# Patient Record
Sex: Male | Born: 1950 | Race: Black or African American | Hispanic: No | Smoking: Never smoker
Health system: Southern US, Community
[De-identification: ages and names within clinical notes are randomized; demographics above are authoritative.]

## PROBLEM LIST (undated history)

## (undated) DIAGNOSIS — R05 Cough: Secondary | ICD-10-CM

## (undated) DIAGNOSIS — I509 Heart failure, unspecified: Secondary | ICD-10-CM

## (undated) DIAGNOSIS — R590 Localized enlarged lymph nodes: Secondary | ICD-10-CM

## (undated) DIAGNOSIS — R053 Chronic cough: Secondary | ICD-10-CM

## (undated) DIAGNOSIS — J8410 Pulmonary fibrosis, unspecified: Secondary | ICD-10-CM

## (undated) DIAGNOSIS — F1411 Cocaine abuse, in remission: Secondary | ICD-10-CM

## (undated) DIAGNOSIS — J449 Chronic obstructive pulmonary disease, unspecified: Secondary | ICD-10-CM

## (undated) DIAGNOSIS — K4090 Unilateral inguinal hernia, without obstruction or gangrene, not specified as recurrent: Secondary | ICD-10-CM

## (undated) DIAGNOSIS — R945 Abnormal results of liver function studies: Secondary | ICD-10-CM

## (undated) DIAGNOSIS — R7989 Other specified abnormal findings of blood chemistry: Secondary | ICD-10-CM

## (undated) DIAGNOSIS — J439 Emphysema, unspecified: Secondary | ICD-10-CM

## (undated) DIAGNOSIS — K219 Gastro-esophageal reflux disease without esophagitis: Secondary | ICD-10-CM

## (undated) HISTORY — DX: Chronic cough: R05.3

## (undated) HISTORY — DX: Chronic obstructive pulmonary disease, unspecified: J44.9

## (undated) HISTORY — DX: Heart failure, unspecified: I50.9

## (undated) HISTORY — DX: Cough: R05

## (undated) HISTORY — DX: Other specified abnormal findings of blood chemistry: R79.89

## (undated) HISTORY — DX: Emphysema, unspecified: J43.9

## (undated) HISTORY — DX: Cocaine abuse, in remission: F14.11

## (undated) HISTORY — DX: Abnormal results of liver function studies: R94.5

## (undated) HISTORY — DX: Localized enlarged lymph nodes: R59.0

## (undated) HISTORY — DX: Unilateral inguinal hernia, without obstruction or gangrene, not specified as recurrent: K40.90

## (undated) HISTORY — DX: Pulmonary fibrosis, unspecified: J84.10

---
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
## 1971-02-03 HISTORY — PX: HERNIA REPAIR: SHX51

## 2013-03-01 ENCOUNTER — Ambulatory Visit: Payer: Self-pay | Admitting: Family Medicine

## 2013-03-31 ENCOUNTER — Ambulatory Visit: Payer: Self-pay | Admitting: Family Medicine

## 2013-05-04 ENCOUNTER — Ambulatory Visit: Payer: Self-pay | Admitting: Internal Medicine

## 2013-05-06 LAB — BRONCHIAL WASH CULTURE

## 2013-05-09 LAB — PATHOLOGY REPORT

## 2013-05-24 ENCOUNTER — Ambulatory Visit: Payer: Self-pay | Admitting: Cardiothoracic Surgery

## 2013-05-25 LAB — CULTURE, FUNGUS WITHOUT SMEAR

## 2013-06-02 ENCOUNTER — Ambulatory Visit: Payer: Self-pay | Admitting: Cardiothoracic Surgery

## 2013-06-15 DIAGNOSIS — J8410 Pulmonary fibrosis, unspecified: Secondary | ICD-10-CM | POA: Insufficient documentation

## 2013-06-19 DIAGNOSIS — J849 Interstitial pulmonary disease, unspecified: Secondary | ICD-10-CM | POA: Insufficient documentation

## 2013-06-29 ENCOUNTER — Ambulatory Visit: Payer: Self-pay | Admitting: Rheumatology

## 2013-07-05 ENCOUNTER — Ambulatory Visit: Payer: Self-pay | Admitting: Rheumatology

## 2013-07-13 DIAGNOSIS — R590 Localized enlarged lymph nodes: Secondary | ICD-10-CM | POA: Insufficient documentation

## 2013-07-20 ENCOUNTER — Ambulatory Visit: Payer: Self-pay | Admitting: Family Medicine

## 2013-09-06 ENCOUNTER — Ambulatory Visit: Payer: Self-pay | Admitting: Family Medicine

## 2013-09-18 ENCOUNTER — Ambulatory Visit: Payer: Self-pay | Admitting: Specialist

## 2013-11-27 ENCOUNTER — Ambulatory Visit: Payer: Self-pay | Admitting: Gastroenterology

## 2013-12-05 ENCOUNTER — Ambulatory Visit: Payer: Self-pay | Admitting: Gastroenterology

## 2013-12-15 ENCOUNTER — Ambulatory Visit: Payer: Self-pay | Admitting: Gastroenterology

## 2014-01-24 ENCOUNTER — Encounter: Payer: Self-pay | Admitting: Surgery

## 2014-03-30 ENCOUNTER — Ambulatory Visit: Payer: Self-pay | Admitting: Specialist

## 2014-05-18 ENCOUNTER — Other Ambulatory Visit: Payer: Self-pay | Admitting: Specialist

## 2014-05-18 DIAGNOSIS — R918 Other nonspecific abnormal finding of lung field: Secondary | ICD-10-CM

## 2014-05-28 ENCOUNTER — Ambulatory Visit
Admit: 2014-05-28 | Disposition: A | Discharge status: Home / Self Care | Payer: Self-pay | Attending: Family Medicine | Admitting: Family Medicine

## 2014-05-28 LAB — SURGICAL PATHOLOGY

## 2014-10-01 ENCOUNTER — Ambulatory Visit
Admission: RE | Admit: 2014-10-01 | Discharge: 2014-10-01 | Disposition: A | Discharge status: Home / Self Care | Payer: PRIVATE HEALTH INSURANCE | Source: Ambulatory Visit | Attending: Specialist | Admitting: Specialist

## 2014-10-01 DIAGNOSIS — R918 Other nonspecific abnormal finding of lung field: Secondary | ICD-10-CM | POA: Diagnosis present

## 2014-10-01 DIAGNOSIS — J8410 Pulmonary fibrosis, unspecified: Secondary | ICD-10-CM | POA: Diagnosis not present

## 2014-10-01 DIAGNOSIS — R590 Localized enlarged lymph nodes: Secondary | ICD-10-CM | POA: Diagnosis not present

## 2014-10-24 DIAGNOSIS — Z9889 Other specified postprocedural states: Secondary | ICD-10-CM | POA: Insufficient documentation

## 2014-10-24 DIAGNOSIS — E785 Hyperlipidemia, unspecified: Secondary | ICD-10-CM | POA: Insufficient documentation

## 2014-10-24 DIAGNOSIS — I2510 Atherosclerotic heart disease of native coronary artery without angina pectoris: Secondary | ICD-10-CM | POA: Insufficient documentation

## 2014-10-24 DIAGNOSIS — I10 Essential (primary) hypertension: Secondary | ICD-10-CM | POA: Insufficient documentation

## 2014-10-24 DIAGNOSIS — J449 Chronic obstructive pulmonary disease, unspecified: Secondary | ICD-10-CM | POA: Insufficient documentation

## 2014-10-26 ENCOUNTER — Ambulatory Visit (INDEPENDENT_AMBULATORY_CARE_PROVIDER_SITE_OTHER): Payer: PRIVATE HEALTH INSURANCE | Admitting: Surgery

## 2014-10-26 ENCOUNTER — Encounter (INDEPENDENT_AMBULATORY_CARE_PROVIDER_SITE_OTHER): Payer: Self-pay

## 2014-10-26 ENCOUNTER — Encounter: Payer: Self-pay | Admitting: Surgery

## 2014-10-26 VITALS — BP 144/76 | HR 69 | Temp 97.9°F | Ht 69.0 in | Wt 217.0 lb

## 2014-10-26 DIAGNOSIS — K4091 Unilateral inguinal hernia, without obstruction or gangrene, recurrent: Secondary | ICD-10-CM | POA: Diagnosis not present

## 2014-10-26 DIAGNOSIS — K4090 Unilateral inguinal hernia, without obstruction or gangrene, not specified as recurrent: Secondary | ICD-10-CM

## 2014-10-26 MED ORDER — DEXTROSE 5 % IV SOLN: 2.0000 g | INTRAVENOUS | Status: AC

## 2014-10-26 MED ORDER — LACTATED RINGERS IV SOLN: INTRAVENOUS | Status: DC

## 2014-10-26 NOTE — Patient Instructions (Signed)
Angie will be contacting you to let you know when your appointment to Pre-Admit will be.  Remember that your surgery will be scheduled on 11/05/2014.

## 2014-10-26 NOTE — Progress Notes (Signed)
Subjective:     Patient ID: Jason Fitzgerald, male   DOB: Jul 13, 1950, 64 y.o.   MRN: [id number]  HPI  64 yr old male with complaints of right inguinal hernia.  Patient states that he had it repaired open in 1973, but since then with increased coughing he noticed it returned.  He stated that it will come out with coughing and he can push it back in.  He notes that on a couple occasions it has stuck for a while but gone back in.  He denies any pain there at this time.    Filed Vitals:   10/26/14 1032  BP: 144/76  Pulse: 69  Temp: 97.9 F (36.6 C)   Past Medical History  Diagnosis Date  . COPD (chronic obstructive pulmonary disease)    Past Surgical History  Procedure Laterality Date  . Hernia repair Right 1973    open   Family History  Problem Relation Age of Onset  . Diabetes Mother   . Cancer Father    Social History   Social History  . Marital Status: Divorced    Spouse Name: N/A  . Number of Children: N/A  . Years of Education: N/A   Social History Main Topics  . Smoking status: Never Smoker   . Smokeless tobacco: Never Used  . Alcohol Use: 0.0 oz/week    0 Standard drinks or equivalent per week     Comment: ocassional  . Drug Use: No  . Sexual Activity: Not Asked   Other Topics Concern  . None   Social History Narrative  . None   No Known Allergies  Current outpatient prescriptions:  .  benzonatate (TESSALON) 100 MG capsule, Take 1 capsule by mouth., Disp: , Rfl:  .  budesonide-formoterol (SYMBICORT) 80-4.5 MCG/ACT inhaler, Take 1 tablet by mouth 1 day or 1 dose., Disp: , Rfl:  .  hydroxychloroquine (PLAQUENIL) 200 MG tablet, Take 1 tablet by mouth 1 day or 1 dose., Disp: , Rfl:  .  pantoprazole (PROTONIX) 40 MG tablet, Take 1 tablet by mouth 1 day or 1 dose., Disp: , Rfl:   Current facility-administered medications:  .  ceFAZolin (ANCEF) 2 g in dextrose 5 % 50 mL IVPB, 2 g, Intravenous, On Call to OR, Hubbard Robinson, MD .  lactated ringers infusion, ,  Intravenous, Continuous, Hubbard Robinson, MD   Review of Systems  Constitutional: Negative for fever, chills, activity change, appetite change and fatigue.  HENT: Positive for congestion and rhinorrhea. Negative for sinus pressure and sore throat.   Respiratory: Positive for cough and shortness of breath. Negative for chest tightness, wheezing and stridor.   Cardiovascular: Negative for chest pain, palpitations and leg swelling.  Gastrointestinal: Negative for nausea, vomiting, abdominal pain, diarrhea, constipation, blood in stool and abdominal distention.  Genitourinary: Negative for dysuria, hematuria, scrotal swelling, difficulty urinating and testicular pain.  Skin: Negative for color change, pallor, rash and wound.  Neurological: Negative for dizziness, weakness and headaches.  Psychiatric/Behavioral: The patient is not nervous/anxious.   All other systems reviewed and are negative.      Objective:   Physical Exam  Constitutional: He is oriented to person, place, and time. He appears well-developed and well-nourished. No distress.  HENT:  Head: Normocephalic and atraumatic.  Nose: Nose normal.  Mouth/Throat: Oropharynx is clear and moist.  Eyes: Conjunctivae are normal. Pupils are equal, round, and reactive to light. No scleral icterus.  Neck: Normal range of motion. Neck supple. No tracheal deviation present.  Cardiovascular: Normal rate, regular rhythm, normal heart sounds and intact distal pulses.  Exam reveals no gallop and no friction rub.   No murmur heard. Pulmonary/Chest: Effort normal and breath sounds normal. No respiratory distress. He has no wheezes.  Abdominal: Soft. Bowel sounds are normal. He exhibits no distension. There is no tenderness.  Genitourinary:  Right groin with well healed surgical scar, reducible inguinal hernia, defect felt about 2-3cm in size.   Left groin: no hernia palpable and none with coughing  Musculoskeletal: Normal range of motion. He  exhibits no edema or tenderness.  Neurological: He is alert and oriented to person, place, and time.  Skin: Skin is warm and dry. No rash noted. No erythema. No pallor.  Psychiatric: He has a normal mood and affect. His behavior is normal. Judgment and thought content normal.  Vitals reviewed.      Assessment:     64 yr old male with recurrent right inguinal hernia     Plan:      1. Discussed possibility of incarceration, strangulation, enlargement in size over time, and the risk of emergency surgery in the face of strangulation.  Also discussed the risk of surgery including recurrence which can be up to 10% in the case of laparoscopic repair, use of prosthetic materials (mesh) and the increased risk of infxn, post-op infxn and the possible need for re-operation and removal of mesh if used, possibility of post-op SBO or ileus, and the risks of general anesthetic including MI, CVA, sudden death or even reaction to anesthetic medications. The patient understands the risks, any and all questions were answered to the patient's satisfaction. 2. Patient has elected to proceed with surgical treatment. Procedure will be scheduled for October 3rd.

## 2014-10-30 ENCOUNTER — Telehealth: Payer: Self-pay | Admitting: Surgery

## 2014-10-30 NOTE — Telephone Encounter (Signed)
Pt advised of pre op date/time and sx date. Sx: 11/08/14 Right inguinal hernia repair (lap), possible bilateral--Dr Loflin Pre op: 11/02/14 @ 9:45am--office.

## 2014-11-02 ENCOUNTER — Encounter
Admission: RE | Admit: 2014-11-02 | Discharge: 2014-11-02 | Disposition: A | Discharge status: Home / Self Care | Payer: PRIVATE HEALTH INSURANCE | Source: Ambulatory Visit | Attending: Surgery | Admitting: Surgery

## 2014-11-02 ENCOUNTER — Other Ambulatory Visit: Payer: Self-pay

## 2014-11-02 DIAGNOSIS — J449 Chronic obstructive pulmonary disease, unspecified: Secondary | ICD-10-CM

## 2014-11-02 DIAGNOSIS — Z01818 Encounter for other preprocedural examination: Secondary | ICD-10-CM | POA: Diagnosis present

## 2014-11-02 DIAGNOSIS — K4090 Unilateral inguinal hernia, without obstruction or gangrene, not specified as recurrent: Secondary | ICD-10-CM | POA: Insufficient documentation

## 2014-11-02 HISTORY — DX: Gastro-esophageal reflux disease without esophagitis: K21.9

## 2014-11-02 LAB — DIFFERENTIAL
BASOS PCT: 0 %
Basophils Absolute: 0 10*3/uL (ref 0–0.1)
EOS ABS: 0.4 10*3/uL (ref 0–0.7)
Eosinophils Relative: 8 %
LYMPHS ABS: 1.1 10*3/uL (ref 1.0–3.6)
Lymphocytes Relative: 23 %
Monocytes Absolute: 0.6 10*3/uL (ref 0.2–1.0)
Monocytes Relative: 13 %
NEUTROS PCT: 56 %
Neutro Abs: 2.6 10*3/uL (ref 1.4–6.5)

## 2014-11-02 LAB — HEPATIC FUNCTION PANEL
ALBUMIN: 3.7 g/dL (ref 3.5–5.0)
ALT: 14 U/L — ABNORMAL LOW (ref 17–63)
AST: 22 U/L (ref 15–41)
Alkaline Phosphatase: 53 U/L (ref 38–126)
BILIRUBIN DIRECT: 0.1 mg/dL (ref 0.1–0.5)
BILIRUBIN TOTAL: 0.7 mg/dL (ref 0.3–1.2)
Indirect Bilirubin: 0.6 mg/dL (ref 0.3–0.9)
Total Protein: 7.6 g/dL (ref 6.5–8.1)

## 2014-11-02 LAB — BASIC METABOLIC PANEL
ANION GAP: 9 (ref 5–15)
BUN: 18 mg/dL (ref 6–20)
CALCIUM: 8.4 mg/dL — AB (ref 8.9–10.3)
CO2: 24 mmol/L (ref 22–32)
CREATININE: 0.85 mg/dL (ref 0.61–1.24)
Chloride: 105 mmol/L (ref 101–111)
GFR calc Af Amer: >60 mL/min (ref >60)
GLUCOSE: 97 mg/dL (ref 65–99)
Potassium: 3.6 mmol/L (ref 3.5–5.1)
Sodium: 138 mmol/L (ref 135–145)

## 2014-11-02 LAB — CBC
HCT: 39.6 % — ABNORMAL LOW (ref 40.0–52.0)
HEMOGLOBIN: 13.1 g/dL (ref 13.0–18.0)
MCH: 28.3 pg (ref 26.0–34.0)
MCHC: 33 g/dL (ref 32.0–36.0)
MCV: 85.7 fL (ref 80.0–100.0)
Platelets: 249 10*3/uL (ref 150–440)
RBC: 4.62 MIL/uL (ref 4.40–5.90)
RDW: 13.4 % (ref 11.5–14.5)
WBC: 4.6 10*3/uL (ref 3.8–10.6)

## 2014-11-02 NOTE — Addendum Note (Signed)
Addended by: Phillips Odor on: 11/02/2014 12:30 PM   Modules accepted: Orders

## 2014-11-02 NOTE — Pre-Procedure Instructions (Signed)
Spoke with Angie at Dr. Mills Koller office, need pre-op orders re-entered, the orders did not go into Manage Orders section of Epic,also asked if pt needs to stop Plaquinil, answer was no, pt can continue taking Plaquinil.

## 2014-11-02 NOTE — Pre-Procedure Instructions (Signed)
EKG taken to DR. Adams for review, ok to proceed.

## 2014-11-02 NOTE — Patient Instructions (Signed)
  Your procedure is scheduled on: Thursday Oct. 6, [address]. Report to Same Day Surgery. To find out your arrival time please call [phone] between 1PM - 3PM on Wednesday Oct. 5, 2016.  Remember: Instructions that are not followed completely may result in serious medical risk, up to and including death, or upon the discretion of your surgeon and anesthesiologist your surgery may need to be rescheduled.    _x___ 1. Do not eat food or drink liquids after midnight. No gum chewing or hard candies.     _x__ 2. No Alcohol for 24 hours before or after surgery.   ____ 3. Bring all medications with you on the day of surgery if instructed.    _x___ 4. Notify your doctor if there is any change in your medical condition     (cold, fever, infections).     Do not wear jewelry, make-up, hairpins, clips or nail polish.  Do not wear lotions, powders, or perfumes. You may wear deodorant.  Do not shave 48 hours prior to surgery. Men may shave face and neck.  Do not bring valuables to the hospital.    [facility] is not responsible for any belongings or valuables.               Contacts, dentures or bridgework may not be worn into surgery.  Leave your suitcase in the car. After surgery it may be brought to your room.  For patients admitted to the hospital, discharge time is determined by your treatment team.   Patients discharged the day of surgery will not be allowed to drive home.    Please read over the following fact sheets that you were given:   [facility] [place] Preparing for Surgery  ____ Take these medicines the morning of surgery with A SIP OF WATER:    1. pantoprazole (PROTONIX)    ____ Fleet Enema (as directed)   _x___ Use CHG Soap as directed  _x___ Use inhalers on the day of surgery and bring to hospital.  ____ Stop metformin 2 days prior to surgery    ____ Take 1/2 of usual insulin dose the night before surgery and none on the morning of surgery.   ____ Stop  Coumadin/Plavix/aspirin on does not apply.  _x___ Stop Anti-inflammatories now, can take ""Tylenol for pain.   ____ Stop supplements until after surgery.    ____ Bring C-Pap to the hospital.

## 2014-11-07 ENCOUNTER — Telehealth: Payer: Self-pay | Admitting: Surgery

## 2014-11-07 NOTE — Telephone Encounter (Signed)
Authorization has been obtained for CPT: [id number] Authorization # [id number] per Roselyn Reef with [employer].

## 2014-11-08 ENCOUNTER — Encounter: Payer: Self-pay | Admitting: *Deleted

## 2014-11-08 ENCOUNTER — Ambulatory Visit: Payer: PRIVATE HEALTH INSURANCE | Admitting: Anesthesiology

## 2014-11-08 ENCOUNTER — Encounter
Admission: RE | Disposition: A | Discharge status: Home / Self Care | Payer: Self-pay | Source: Ambulatory Visit | Attending: Surgery

## 2014-11-08 ENCOUNTER — Ambulatory Visit
Admission: RE | Admit: 2014-11-08 | Discharge: 2014-11-08 | Disposition: A | Discharge status: Home / Self Care | Payer: PRIVATE HEALTH INSURANCE | Source: Ambulatory Visit | Attending: Surgery | Admitting: Surgery

## 2014-11-08 DIAGNOSIS — Z809 Family history of malignant neoplasm, unspecified: Secondary | ICD-10-CM | POA: Insufficient documentation

## 2014-11-08 DIAGNOSIS — K219 Gastro-esophageal reflux disease without esophagitis: Secondary | ICD-10-CM | POA: Insufficient documentation

## 2014-11-08 DIAGNOSIS — K4091 Unilateral inguinal hernia, without obstruction or gangrene, recurrent: Secondary | ICD-10-CM | POA: Diagnosis not present

## 2014-11-08 DIAGNOSIS — Z833 Family history of diabetes mellitus: Secondary | ICD-10-CM | POA: Insufficient documentation

## 2014-11-08 DIAGNOSIS — F17210 Nicotine dependence, cigarettes, uncomplicated: Secondary | ICD-10-CM | POA: Diagnosis not present

## 2014-11-08 DIAGNOSIS — J449 Chronic obstructive pulmonary disease, unspecified: Secondary | ICD-10-CM | POA: Diagnosis not present

## 2014-11-08 DIAGNOSIS — K4090 Unilateral inguinal hernia, without obstruction or gangrene, not specified as recurrent: Secondary | ICD-10-CM | POA: Diagnosis present

## 2014-11-08 HISTORY — PX: INGUINAL HERNIA REPAIR: SHX194

## 2014-11-08 LAB — URINE DRUG SCREEN, QUALITATIVE (ARMC ONLY)
AMPHETAMINES, UR SCREEN: NOT DETECTED
BENZODIAZEPINE, UR SCRN: NOT DETECTED
Barbiturates, Ur Screen: NOT DETECTED
Cannabinoid 50 Ng, Ur ~~LOC~~: NOT DETECTED
Cocaine Metabolite,Ur ~~LOC~~: NOT DETECTED
MDMA (ECSTASY) UR SCREEN: NOT DETECTED
Methadone Scn, Ur: NOT DETECTED
OPIATE, UR SCREEN: NOT DETECTED
PHENCYCLIDINE (PCP) UR S: NOT DETECTED
Tricyclic, Ur Screen: NOT DETECTED

## 2014-11-08 SURGERY — REPAIR, HERNIA, INGUINAL, BILATERAL, LAPAROSCOPIC
Anesthesia: General | Laterality: Right | Wound class: Clean

## 2014-11-08 MED ORDER — PROPOFOL 10 MG/ML IV BOLUS
INTRAVENOUS | Status: DC | PRN
  Administered 2014-11-08: 200 mg via INTRAVENOUS

## 2014-11-08 MED ORDER — LACTATED RINGERS IV SOLN
INTRAVENOUS | Status: DC
  Administered 2014-11-08 (×2): via INTRAVENOUS

## 2014-11-08 MED ORDER — ONDANSETRON HCL 4 MG/2ML IJ SOLN: 4.0000 mg | Freq: Once | INTRAMUSCULAR | Status: DC | PRN

## 2014-11-08 MED ORDER — BUPIVACAINE HCL (PF) 0.25 % IJ SOLN
INTRAMUSCULAR | Status: AC
  Filled 2014-11-08: qty 30

## 2014-11-08 MED ORDER — HYDROMORPHONE HCL 1 MG/ML IJ SOLN: 0.2500 mg | INTRAMUSCULAR | Status: DC | PRN

## 2014-11-08 MED ORDER — FENTANYL CITRATE (PF) 250 MCG/5ML IJ SOLN
INTRAMUSCULAR | Status: DC | PRN
  Administered 2014-11-08: 50 ug via INTRAVENOUS
  Administered 2014-11-08: 100 ug via INTRAVENOUS
  Administered 2014-11-08 (×4): 50 ug via INTRAVENOUS

## 2014-11-08 MED ORDER — ROCURONIUM BROMIDE 100 MG/10ML IV SOLN
INTRAVENOUS | Status: DC | PRN
  Administered 2014-11-08: 10 mg via INTRAVENOUS
  Administered 2014-11-08: 40 mg via INTRAVENOUS

## 2014-11-08 MED ORDER — CEFAZOLIN SODIUM-DEXTROSE 2-3 GM-% IV SOLR
2.0000 g | INTRAVENOUS | Status: AC
  Administered 2014-11-08: 2 g via INTRAVENOUS

## 2014-11-08 MED ORDER — LIDOCAINE HCL (CARDIAC) 20 MG/ML IV SOLN
INTRAVENOUS | Status: DC | PRN
Start: 2014-11-08 — End: 2014-11-08
  Administered 2014-11-08: 100 mg via INTRAVENOUS

## 2014-11-08 MED ORDER — DEXAMETHASONE SODIUM PHOSPHATE 10 MG/ML IJ SOLN
INTRAMUSCULAR | Status: DC | PRN
Start: 2014-11-08 — End: 2014-11-08
  Administered 2014-11-08: 10 mg via INTRAVENOUS

## 2014-11-08 MED ORDER — SUCCINYLCHOLINE CHLORIDE 20 MG/ML IJ SOLN
INTRAMUSCULAR | Status: DC | PRN
  Administered 2014-11-08: 120 mg via INTRAVENOUS

## 2014-11-08 MED ORDER — FENTANYL CITRATE (PF) 100 MCG/2ML IJ SOLN
25.0000 ug | INTRAMUSCULAR | Status: DC | PRN
  Administered 2014-11-08 (×4): 25 ug via INTRAVENOUS

## 2014-11-08 MED ORDER — GLYCOPYRROLATE 0.2 MG/ML IJ SOLN
INTRAMUSCULAR | Status: DC | PRN
  Administered 2014-11-08: 0.2 mg via INTRAVENOUS

## 2014-11-08 MED ORDER — MIDAZOLAM HCL 5 MG/5ML IJ SOLN
INTRAMUSCULAR | Status: DC | PRN
  Administered 2014-11-08: 2 mg via INTRAVENOUS

## 2014-11-08 MED ORDER — CEFAZOLIN SODIUM-DEXTROSE 2-3 GM-% IV SOLR
INTRAVENOUS | Status: AC
  Administered 2014-11-08: 2 g via INTRAVENOUS
  Filled 2014-11-08: qty 50

## 2014-11-08 MED ORDER — ACETAMINOPHEN 10 MG/ML IV SOLN
INTRAVENOUS | Status: DC | PRN
  Administered 2014-11-08: 1000 mg via INTRAVENOUS

## 2014-11-08 MED ORDER — ESMOLOL HCL 10 MG/ML IV SOLN
INTRAVENOUS | Status: DC | PRN
  Administered 2014-11-08 (×2): 10 mg via INTRAVENOUS

## 2014-11-08 MED ORDER — OXYCODONE-ACETAMINOPHEN 5-325 MG PO TABS
ORAL_TABLET | ORAL | Status: DC
Start: 2014-11-08 — End: 2014-11-08
  Filled 2014-11-08: qty 1

## 2014-11-08 MED ORDER — ACETAMINOPHEN 10 MG/ML IV SOLN
INTRAVENOUS | Status: AC
  Filled 2014-11-08: qty 100

## 2014-11-08 MED ORDER — BUPIVACAINE HCL (PF) 0.25 % IJ SOLN
INTRAMUSCULAR | Status: DC | PRN
Start: 2014-11-08 — End: 2014-11-08
  Administered 2014-11-08: 30 mL

## 2014-11-08 MED ORDER — ACETAMINOPHEN 10 MG/ML IV SOLN: INTRAVENOUS | Status: DC | PRN

## 2014-11-08 MED ORDER — GLYCOPYRROLATE 0.2 MG/ML IJ SOLN: INTRAMUSCULAR | Status: DC | PRN

## 2014-11-08 MED ORDER — OXYCODONE-ACETAMINOPHEN 5-325 MG PO TABS: 1.0000 | ORAL_TABLET | ORAL | Status: DC | PRN

## 2014-11-08 MED ORDER — OXYCODONE-ACETAMINOPHEN 5-325 MG PO TABS
1.0000 | ORAL_TABLET | ORAL | Status: DC | PRN
  Administered 2014-11-08: 1 via ORAL

## 2014-11-08 MED ORDER — FENTANYL CITRATE (PF) 100 MCG/2ML IJ SOLN
INTRAMUSCULAR | Status: AC
  Filled 2014-11-08: qty 2

## 2014-11-08 MED ORDER — ONDANSETRON HCL 4 MG/2ML IJ SOLN
INTRAMUSCULAR | Status: DC | PRN
  Administered 2014-11-08: 4 mg via INTRAVENOUS

## 2014-11-08 MED ORDER — SUGAMMADEX SODIUM 500 MG/5ML IV SOLN
INTRAVENOUS | Status: DC | PRN
  Administered 2014-11-08: 200.4 mg via INTRAVENOUS

## 2014-11-08 SURGICAL SUPPLY — 47 items
CANISTER SUCT 1200ML W/VALVE (MISCELLANEOUS) ×3 IMPLANT
CATH TRAY 16F METER LATEX (MISCELLANEOUS) ×3 IMPLANT
CHLORAPREP W/TINT 26ML (MISCELLANEOUS) ×3 IMPLANT
CLEANER CAUTERY TIP 5X5 PAD (MISCELLANEOUS) ×1 IMPLANT
CLOSURE WOUND 1/2 X4 (GAUZE/BANDAGES/DRESSINGS)
DEFOGGER SCOPE WARMER CLEARIFY (MISCELLANEOUS) ×3 IMPLANT
DEVICE SECURE STRAP 25 ABSORB (INSTRUMENTS) ×3 IMPLANT
DISSECT BALLN SPACEMKR OVL PDB (BALLOONS) ×6
DISSECT BALLN SPACEMKR RND PDB (MISCELLANEOUS)
DISSECTOR BALLN SPCMKR OVL PDB (BALLOONS) ×2 IMPLANT
DISSECTOR BALLN SPCMKR RND PDB (MISCELLANEOUS) IMPLANT
DRAPE SHEET LG 3/4 BI-LAMINATE (DRAPES) IMPLANT
DRAPE UTILITY 15X26 TOWEL STRL (DRAPES) IMPLANT
DRSG TEGADERM 2-3/8X2-3/4 SM (GAUZE/BANDAGES/DRESSINGS) IMPLANT
DRSG TELFA 3X8 NADH (GAUZE/BANDAGES/DRESSINGS) IMPLANT
GLOVE BIO SURGEON STRL SZ7.5 (GLOVE) ×9 IMPLANT
GOWN STRL REUS W/ TWL LRG LVL3 (GOWN DISPOSABLE) ×1 IMPLANT
GOWN STRL REUS W/ TWL XL LVL3 (GOWN DISPOSABLE) IMPLANT
GOWN STRL REUS W/TWL LRG LVL3 (GOWN DISPOSABLE) ×2
GOWN STRL REUS W/TWL XL LVL3 (GOWN DISPOSABLE)
IRRIGATION STRYKERFLOW (MISCELLANEOUS) IMPLANT
IRRIGATOR STRYKERFLOW (MISCELLANEOUS)
IV NS 1000ML (IV SOLUTION)
IV NS 1000ML BAXH (IV SOLUTION) IMPLANT
KIT RM TURNOVER STRD PROC AR (KITS) ×3 IMPLANT
LABEL OR SOLS (LABEL) IMPLANT
LIQUID BAND (GAUZE/BANDAGES/DRESSINGS) ×3 IMPLANT
MESH 3DMAX 5X7 RT XLRG (Mesh General) ×3 IMPLANT
NEEDLE HYPO 25X1 1.5 SAFETY (NEEDLE) ×3 IMPLANT
NS IRRIG 500ML POUR BTL (IV SOLUTION) ×3 IMPLANT
PACK LAP CHOLECYSTECTOMY (MISCELLANEOUS) ×3 IMPLANT
PAD CLEANER CAUTERY TIP 5X5 (MISCELLANEOUS) ×2
PAD GROUND ADULT SPLIT (MISCELLANEOUS) ×3 IMPLANT
PENCIL ELECTRO HAND CTR (MISCELLANEOUS) ×3 IMPLANT
SLEEVE ENDOPATH XCEL 5M (ENDOMECHANICALS) ×3 IMPLANT
STRIP CLOSURE SKIN 1/2X4 (GAUZE/BANDAGES/DRESSINGS) IMPLANT
SURGILUBE 2OZ TUBE FLIPTOP (MISCELLANEOUS) ×3 IMPLANT
SUT MNCRL 4-0 (SUTURE) ×2
SUT MNCRL 4-0 27XMFL (SUTURE) ×1
SUT VIC AB 0 CT2 27 (SUTURE) ×3 IMPLANT
SUTURE MNCRL 4-0 27XMF (SUTURE) ×1 IMPLANT
SWABSTK COMLB BENZOIN TINCTURE (MISCELLANEOUS) IMPLANT
TACKER 5MM HERNIA 3.5CML NAB (ENDOMECHANICALS) IMPLANT
TROCAR 5MM SINGLE VERSAONE (TROCAR) ×3 IMPLANT
TROCAR BALLN 10M OMST10SB SPAC (TROCAR) ×3 IMPLANT
TROCAR XCEL NON-BLD 5MMX100MML (ENDOMECHANICALS) ×3 IMPLANT
TUBING INSUFFLATOR HI FLOW (MISCELLANEOUS) ×3 IMPLANT

## 2014-11-08 NOTE — Discharge Instructions (Signed)
Inguinal Hernia, Adult , Care After Refer to this sheet in the next few weeks. These discharge instructions provide you with general information on caring for yourself after you leave the hospital. Your caregiver may also give you specific instructions. Your treatment has been planned according to the most current medical practices available, but unavoidable complications sometimes occur. If you have any problems or questions after discharge, please call your caregiver. HOME CARE INSTRUCTIONS  Put ice on the operative site.  Put ice in a plastic bag.  Place a towel between your skin and the bag.  Leave the ice on for 15-20 minutes at a time, 03-04 times a day while awake.  Change bandages (dressings) as directed.  Keep the wound dry and clean. The wound may be washed gently with soap and water. Gently blot or dab the wound dry. It is okay to take showers 24 to 48 hours after surgery. Do not take baths, use swimming pools, or use hot tubs for 10 days, or as directed by your caregiver.  Only take over-the-counter or prescription medicines for pain, discomfort, or fever as directed by your caregiver.  Continue your normal diet as directed.  Do not lift anything more than 10 pounds or play contact sports for 3 weeks, or as directed. SEEK MEDICAL CARE IF:  There is redness, swelling, or increasing pain in the wound.  There is fluid (pus) coming from the wound.  There is drainage from a wound lasting longer than 1 day.  You have an oral temperature above 102 F (38.9 C).  You notice a bad smell coming from the wound or dressing.  The wound breaks open after the stitches (sutures) have been removed.  You notice increasing pain in the shoulders (shoulder strap areas).  You develop dizzy episodes or fainting while standing.  You feel sick to your stomach (nauseous) or throw up (vomit). SEEK IMMEDIATE MEDICAL CARE IF:  You develop a rash.  You have difficulty breathing.  You  develop a reaction or have side effects to medicines you were given. MAKE SURE YOU:   Understand these instructions.  Will watch your condition.  Will get help right away if you are not doing well or get worse.   This information is not intended to replace advice given to you by your health care provider. Make sure you discuss any questions you have with your health care provider.   Document Released: 02/19/2006 Document Revised: 02/09/2014 Document Reviewed: 07/23/2014 Elsevier Interactive Patient Education 2016 [place] Anesthesia, Adult, Care After Refer to this sheet in the next few weeks. These instructions provide you with information on caring for yourself after your procedure. Your health care provider may also give you more specific instructions. Your treatment has been planned according to current medical practices, but problems sometimes occur. Call your health care provider if you have any problems or questions after your procedure. WHAT TO EXPECT AFTER THE PROCEDURE After the procedure, it is typical to experience:  Sleepiness.  Nausea and vomiting. HOME CARE INSTRUCTIONS  For the first 24 hours after general anesthesia:  Have a responsible person with you.  Do not drive a car. If you are alone, do not take public transportation.  Do not drink alcohol.  Do not take medicine that has not been prescribed by your health care provider.  Do not sign important papers or make important decisions.  You may resume a normal diet and activities as directed by your health care provider.  Change  bandages (dressings) as directed.  If you have questions or problems that seem related to general anesthesia, call the hospital and ask for the anesthetist or anesthesiologist on call. SEEK MEDICAL CARE IF:  You have nausea and vomiting that continue the day after anesthesia.  You develop a rash. SEEK IMMEDIATE MEDICAL CARE IF:   You have difficulty  breathing.  You have chest pain.  You have any allergic problems.   This information is not intended to replace advice given to you by your health care provider. Make sure you discuss any questions you have with your health care provider.   Document Released: 04/27/2000 Document Revised: 02/09/2014 Document Reviewed: 05/20/2011 Elsevier Interactive Patient Education [employer].

## 2014-11-08 NOTE — Transfer of Care (Signed)
Immediate Anesthesia Transfer of Care Note  Patient: Jason Fitzgerald  Procedure(s) Performed: Procedure(s): LAPAROSCOPIC RIGHT INGUINAL HERNIA REPAIR (Right)  Patient Location: PACU  Anesthesia Type:General  Level of Consciousness: awake, alert  and oriented  Airway & Oxygen Therapy: Patient Spontanous Breathing and Patient connected to face mask oxygen  Post-op Assessment: Report given to RN and Post -op Vital signs reviewed and stable  Post vital signs: Reviewed and stable  Last Vitals: 100% 20resp 98hr 154/94 98.9temp Filed Vitals:   11/08/14 0748  BP: 142/78  Pulse: 86  Temp: 35.7 C  Resp: 18    Complications: No apparent anesthesia complications

## 2014-11-08 NOTE — Anesthesia Procedure Notes (Signed)
Procedure Name: Intubation Date/Time: 11/08/2014 9:43 AM Performed by: Delaney Meigs Pre-anesthesia Checklist: Patient identified, Emergency Drugs available, Suction available, Patient being monitored and Timeout performed Patient Re-evaluated:Patient Re-evaluated prior to inductionOxygen Delivery Method: Circle system utilized Preoxygenation: Pre-oxygenation with 100% oxygen Intubation Type: IV induction Ventilation: Mask ventilation without difficulty Laryngoscope Size: McGraph and 4 Grade View: Grade II Tube type: Oral Tube size: 7.5 mm Number of attempts: 1 Airway Equipment and Method: Stylet Placement Confirmation: ETT inserted through vocal cords under direct vision,  positive ETCO2 and breath sounds checked- equal and bilateral Secured at: 21 cm Tube secured with: Tape Dental Injury: Teeth and Oropharynx as per pre-operative assessment

## 2014-11-08 NOTE — Op Note (Signed)
Herniorrhaphy Procedure Note  Indications: The patient has a symptomatic recurrent right inguinal hernia.   Pre-operative Diagnosis: right inguinal hernia  Post-operative Diagnosis: right inguinal hernia  Surgeon: Hubbard Robinson    Anesthesia: General endotracheal anesthesia  ASA Class: 3  Procedure Details  The patient was seen in the Holding Room. The risks, benefits, complications, treatment options, and expected outcomes were discussed with the patient. The possibilities of reaction to medication, pain, infection, bleeding, heart attack, death, injury to internal organs, recurrence, testicular damage, infertility, or need for further surgery were discussed with the patient. The patient concurred with the proposed plan, giving informed consent.  The site of surgery properly noted/marked. The patient was taken to Operating Room, identified as Jason Fitzgerald and the procedure verified as Laparoscopic Right inguinal hernia repair possible bilateral. A Time Out was held and the above information confirmed.  After the induction of adequate anesthesia, the abdomen was prepped and draped in the usual sterile fashion. An infraumbilical skin incision was made and carried to the external oblique fascia. An small incision in the fascia exposed the rectus muscle which was retracted laterally. The dissection balloon was introduced into the preperitoneal space and advanced to the pubis. The balloon was distended with air by pumping 30 times. The air was evacuated and the balloon removed. The trocar with balloon was then inserted and the preperitoneal space inflated with gas.  The laparoscope was introduced after distending the dissected cavity with C02 gas. Two 5 mm porst were introduced under direct visualization. With blunt dissection the hernia sac was reduced and the spermatic cord skeletonized. A Bard 3D extra large right sided mesh was introduced into the preperitoneal space.  The mesh was fixed to  Cooper's ligament and the anterior abdominal wall with the secure strap tacks. Nerves and vessels were protected. No sutures or staples were placed in the femoral vessels.  Hemostasis was obtained.  The left side was inspected and no defect was noted.  Gas was evacuated, trocars removed and the fascial defects closed with 0-vicryl.  The skin was closed using 4-0 Monocryl and sterile glue was applied to wounds.  At the end of the operation, all sponge, instruments, and needle counts were correct.   Findings: right indirect hernia  Estimated Blood Loss:  less than 50 mL         Drains: none         Total IV Fluids: 1071ml         Specimens: none         Implants: Right sided 3D polypropylene mesh         Complications:  None; patient tolerated the procedure well.         Disposition: PACU - hemodynamically stable.         Condition: stable

## 2014-11-08 NOTE — Anesthesia Preprocedure Evaluation (Signed)
Anesthesia Evaluation  Patient identified by MRN, date of birth, ID band Patient awake    Reviewed: Allergy & Precautions, H&P , NPO status , Patient's Chart, lab work & pertinent test results, reviewed documented beta blocker date and time   Airway Mallampati: II  TM Distance: >3 FB Neck ROM: full    Dental no notable dental hx. (+) Teeth Intact   Pulmonary neg pulmonary ROS, pneumonia, resolved, COPD, Current Smoker,    Pulmonary exam normal breath sounds clear to auscultation       Cardiovascular Exercise Tolerance: Poor hypertension, + CAD  negative cardio ROS Normal cardiovascular exam Rhythm:regular Rate:Normal     Neuro/Psych negative neurological ROS  negative psych ROS   GI/Hepatic negative GI ROS, Neg liver ROS, GERD  ,  Endo/Other  negative endocrine ROS  Renal/GU negative Renal ROS  negative genitourinary   Musculoskeletal   Abdominal   Peds  Hematology negative hematology ROS (+)   Anesthesia Other Findings   Reproductive/Obstetrics negative OB ROS                             Anesthesia Physical Anesthesia Plan  ASA: II  Anesthesia Plan: General   Post-op Pain Management:    Induction:   Airway Management Planned:   Additional Equipment:   Intra-op Plan:   Post-operative Plan:   Informed Consent: I have reviewed the patients History and Physical, chart, labs and discussed the procedure including the risks, benefits and alternatives for the proposed anesthesia with the patient or authorized representative who has indicated his/her understanding and acceptance.   Dental Advisory Given  Plan Discussed with: CRNA  Anesthesia Plan Comments:         Anesthesia Quick Evaluation

## 2014-11-08 NOTE — Anesthesia Postprocedure Evaluation (Signed)
  Anesthesia Post-op Note  Patient: Jason Fitzgerald  Procedure(s) Performed: Procedure(s): LAPAROSCOPIC RIGHT INGUINAL HERNIA REPAIR (Right)  Anesthesia type:General  Patient location: PACU  Post pain: Pain level controlled  Post assessment: Post-op Vital signs reviewed, Patient's Cardiovascular Status Stable, Respiratory Function Stable, Patent Airway and No signs of Nausea or vomiting  Post vital signs: Reviewed and stable  Last Vitals:  Filed Vitals:   11/08/14 1201  BP: 154/90  Pulse: 95  Temp: 37.2 C  Resp: 22    Level of consciousness: awake, alert  and patient cooperative  Complications: No apparent anesthesia complications

## 2014-11-08 NOTE — Interval H&P Note (Signed)
History and Physical Interval Note:  11/08/2014 9:11 AM  [name]  has presented today for surgery, with the diagnosis of inguinal hernia  The various methods of treatment have been discussed with the patient and family. After consideration of risks, benefits and other options for treatment, the patient has consented to  Procedure(s): Tucker (Right) as a surgical intervention .  The patient's history has been reviewed, patient examined, no change in status, stable for surgery.  I have reviewed the patient's chart and labs.  Questions were answered to the patient's satisfaction.     Catherine L Loflin

## 2014-11-08 NOTE — H&P (View-Only) (Signed)
Subjective:     Patient ID: Jason Fitzgerald, male   DOB: 1950-03-07, 65 y.o.   MRN: [id number]  HPI  64 yr old male with complaints of right inguinal hernia.  Patient states that he had it repaired open in 1973, but since then with increased coughing he noticed it returned.  He stated that it will come out with coughing and he can push it back in.  He notes that on a couple occasions it has stuck for a while but gone back in.  He denies any pain there at this time.    Filed Vitals:   10/26/14 1032  BP: 144/76  Pulse: 69  Temp: 97.9 F (36.6 C)   Past Medical History  Diagnosis Date  . COPD (chronic obstructive pulmonary disease)    Past Surgical History  Procedure Laterality Date  . Hernia repair Right 1973    open   Family History  Problem Relation Age of Onset  . Diabetes Mother   . Cancer Father    Social History   Social History  . Marital Status: Divorced    Spouse Name: N/A  . Number of Children: N/A  . Years of Education: N/A   Social History Main Topics  . Smoking status: Never Smoker   . Smokeless tobacco: Never Used  . Alcohol Use: 0.0 oz/week    0 Standard drinks or equivalent per week     Comment: ocassional  . Drug Use: No  . Sexual Activity: Not Asked   Other Topics Concern  . None   Social History Narrative  . None   No Known Allergies  Current outpatient prescriptions:  .  benzonatate (TESSALON) 100 MG capsule, Take 1 capsule by mouth., Disp: , Rfl:  .  budesonide-formoterol (SYMBICORT) 80-4.5 MCG/ACT inhaler, Take 1 tablet by mouth 1 day or 1 dose., Disp: , Rfl:  .  hydroxychloroquine (PLAQUENIL) 200 MG tablet, Take 1 tablet by mouth 1 day or 1 dose., Disp: , Rfl:  .  pantoprazole (PROTONIX) 40 MG tablet, Take 1 tablet by mouth 1 day or 1 dose., Disp: , Rfl:   Current facility-administered medications:  .  ceFAZolin (ANCEF) 2 g in dextrose 5 % 50 mL IVPB, 2 g, Intravenous, On Call to OR, Hubbard Robinson, MD .  lactated ringers infusion, ,  Intravenous, Continuous, Hubbard Robinson, MD   Review of Systems  Constitutional: Negative for fever, chills, activity change, appetite change and fatigue.  HENT: Positive for congestion and rhinorrhea. Negative for sinus pressure and sore throat.   Respiratory: Positive for cough and shortness of breath. Negative for chest tightness, wheezing and stridor.   Cardiovascular: Negative for chest pain, palpitations and leg swelling.  Gastrointestinal: Negative for nausea, vomiting, abdominal pain, diarrhea, constipation, blood in stool and abdominal distention.  Genitourinary: Negative for dysuria, hematuria, scrotal swelling, difficulty urinating and testicular pain.  Skin: Negative for color change, pallor, rash and wound.  Neurological: Negative for dizziness, weakness and headaches.  Psychiatric/Behavioral: The patient is not nervous/anxious.   All other systems reviewed and are negative.      Objective:   Physical Exam  Constitutional: He is oriented to person, place, and time. He appears well-developed and well-nourished. No distress.  HENT:  Head: Normocephalic and atraumatic.  Nose: Nose normal.  Mouth/Throat: Oropharynx is clear and moist.  Eyes: Conjunctivae are normal. Pupils are equal, round, and reactive to light. No scleral icterus.  Neck: Normal range of motion. Neck supple. No tracheal deviation present.  Cardiovascular: Normal rate, regular rhythm, normal heart sounds and intact distal pulses.  Exam reveals no gallop and no friction rub.   No murmur heard. Pulmonary/Chest: Effort normal and breath sounds normal. No respiratory distress. He has no wheezes.  Abdominal: Soft. Bowel sounds are normal. He exhibits no distension. There is no tenderness.  Genitourinary:  Right groin with well healed surgical scar, reducible inguinal hernia, defect felt about 2-3cm in size.   Left groin: no hernia palpable and none with coughing  Musculoskeletal: Normal range of motion. He  exhibits no edema or tenderness.  Neurological: He is alert and oriented to person, place, and time.  Skin: Skin is warm and dry. No rash noted. No erythema. No pallor.  Psychiatric: He has a normal mood and affect. His behavior is normal. Judgment and thought content normal.  Vitals reviewed.      Assessment:     64 yr old male with recurrent right inguinal hernia     Plan:      1. Discussed possibility of incarceration, strangulation, enlargement in size over time, and the risk of emergency surgery in the face of strangulation.  Also discussed the risk of surgery including recurrence which can be up to 10% in the case of laparoscopic repair, use of prosthetic materials (mesh) and the increased risk of infxn, post-op infxn and the possible need for re-operation and removal of mesh if used, possibility of post-op SBO or ileus, and the risks of general anesthetic including MI, CVA, sudden death or even reaction to anesthetic medications. The patient understands the risks, any and all questions were answered to the patient's satisfaction. 2. Patient has elected to proceed with surgical treatment. Procedure will be scheduled for October 3rd.

## 2014-11-20 ENCOUNTER — Encounter: Payer: Self-pay | Admitting: Surgery

## 2014-11-20 DIAGNOSIS — J449 Chronic obstructive pulmonary disease, unspecified: Secondary | ICD-10-CM | POA: Insufficient documentation

## 2014-11-28 ENCOUNTER — Ambulatory Visit (INDEPENDENT_AMBULATORY_CARE_PROVIDER_SITE_OTHER): Payer: PRIVATE HEALTH INSURANCE | Admitting: Surgery

## 2014-11-28 ENCOUNTER — Encounter: Payer: Self-pay | Admitting: Surgery

## 2014-11-28 VITALS — BP 117/71 | HR 66 | Temp 97.7°F | Ht 69.0 in | Wt 220.6 lb

## 2014-11-28 DIAGNOSIS — K4091 Unilateral inguinal hernia, without obstruction or gangrene, recurrent: Secondary | ICD-10-CM

## 2014-11-28 NOTE — Progress Notes (Signed)
Subjective:     Patient ID: Jason Fitzgerald, male   DOB: [birth year], 64 y.o.   MRN: [id number]  HPI  64 yr old male s/p Lap R inguinal hernia repair with mesh.  Patient states doing well.  He denies any pain.  He is able to eat and drink well and is able to urinate and have bowel movements without any difficulty.  He states that he still has some swelling in the right testicle.      Review of Systems  Constitutional: Negative for fever, activity change and appetite change.  HENT: Negative for congestion and sore throat.   Respiratory: Negative for cough, chest tightness, shortness of breath and wheezing.   Cardiovascular: Negative for chest pain, palpitations and leg swelling.  Gastrointestinal: Negative for nausea, vomiting, abdominal pain, diarrhea, constipation and abdominal distention.  Genitourinary: Positive for scrotal swelling. Negative for dysuria, hematuria, penile swelling, penile pain and testicular pain.  Musculoskeletal: Negative for arthralgias and neck pain.  Skin: Negative for color change, pallor, rash and wound.  Neurological: Negative for dizziness, tremors and weakness.  Hematological: Negative for adenopathy. Does not bruise/bleed easily.  Psychiatric/Behavioral: Negative for agitation. The patient is not nervous/anxious.   All other systems reviewed and are negative.      Filed Vitals:   11/28/14 0938  BP: 117/71  Pulse: 66  Temp: 97.7 F (36.5 C)    Objective:   Physical Exam  Constitutional: He is oriented to person, place, and time. He appears well-developed and well-nourished. No distress.  Cardiovascular: Normal rate, regular rhythm and intact distal pulses.   Pulmonary/Chest: Effort normal and breath sounds normal. No respiratory distress. He has no wheezes.  Abdominal: Soft. Bowel sounds are normal. He exhibits no distension. There is no tenderness.  Incision sites c/d/i, healing well  Genitourinary:  Right scrotum, testicle present, some swelling  along cord, no erythema, no fluctuance, non-tender Left scrotum: normal testicle, no edema  Musculoskeletal: Normal range of motion. He exhibits no edema or tenderness.  Neurological: He is alert and oriented to person, place, and time.  Skin: Skin is warm and dry. No rash noted. No erythema. No pallor.  Psychiatric: He has a normal mood and affect. His behavior is normal. Judgment and thought content normal.  Vitals reviewed.      Assessment:     64 yr old s/p Lap right inguinal hernia repair     Plan:     He is doing well postoperatively.  Assured patient that swelling in right side would gradually resolve over the next 4 weeks.  Patient would like to return to work.  Work note given and he is working on [employer] as well.  He may RTC with any issues if needed.

## 2015-04-05 ENCOUNTER — Other Ambulatory Visit: Payer: Self-pay | Admitting: Specialist

## 2015-04-05 DIAGNOSIS — J849 Interstitial pulmonary disease, unspecified: Secondary | ICD-10-CM

## 2015-04-18 ENCOUNTER — Telehealth: Payer: Self-pay | Admitting: Internal Medicine

## 2015-04-18 NOTE — Telephone Encounter (Signed)
Called phone number listed in message, it was wrong number. Attempted to look up Dr. Tami Ribas, there are several Dr. Ileene Hutchinson in the provider finder, cannot determine which one is the correct office. Misty, do you have any earlier appointments for Dr. Stevenson Clinch to see this patient?  Please advise.

## 2015-04-18 NOTE — Telephone Encounter (Signed)
Spoke with Olivia Mackie at Dr. Ileene Hutchinson office and offered 3/20 & 3/21 at 8:30am but pt unable to come in that early. Gave appt for 4/24 2 10am. Nothing further needed.

## 2015-04-18 NOTE — Telephone Encounter (Signed)
LM on VM for Olivia Mackie ([phone]) to call me back to schedule sooner appt.

## 2015-04-19 ENCOUNTER — Other Ambulatory Visit: Payer: Self-pay | Admitting: Otolaryngology

## 2015-04-19 DIAGNOSIS — R059 Cough, unspecified: Secondary | ICD-10-CM

## 2015-04-19 DIAGNOSIS — R05 Cough: Secondary | ICD-10-CM

## 2015-04-24 ENCOUNTER — Ambulatory Visit
Admission: RE | Admit: 2015-04-24 | Discharge: 2015-04-24 | Disposition: A | Discharge status: Home / Self Care | Payer: PRIVATE HEALTH INSURANCE | Source: Ambulatory Visit | Attending: Otolaryngology | Admitting: Otolaryngology

## 2015-04-24 DIAGNOSIS — K7689 Other specified diseases of liver: Secondary | ICD-10-CM | POA: Diagnosis not present

## 2015-04-24 DIAGNOSIS — R1312 Dysphagia, oropharyngeal phase: Secondary | ICD-10-CM

## 2015-04-24 DIAGNOSIS — J449 Chronic obstructive pulmonary disease, unspecified: Secondary | ICD-10-CM | POA: Insufficient documentation

## 2015-04-24 DIAGNOSIS — I2510 Atherosclerotic heart disease of native coronary artery without angina pectoris: Secondary | ICD-10-CM | POA: Diagnosis not present

## 2015-04-24 DIAGNOSIS — K219 Gastro-esophageal reflux disease without esophagitis: Secondary | ICD-10-CM | POA: Diagnosis not present

## 2015-04-24 DIAGNOSIS — Z87898 Personal history of other specified conditions: Secondary | ICD-10-CM | POA: Diagnosis not present

## 2015-04-24 DIAGNOSIS — J8410 Pulmonary fibrosis, unspecified: Secondary | ICD-10-CM | POA: Diagnosis not present

## 2015-04-24 DIAGNOSIS — I10 Essential (primary) hypertension: Secondary | ICD-10-CM | POA: Insufficient documentation

## 2015-04-24 DIAGNOSIS — R05 Cough: Secondary | ICD-10-CM | POA: Diagnosis present

## 2015-04-24 DIAGNOSIS — R059 Cough, unspecified: Secondary | ICD-10-CM

## 2015-04-24 DIAGNOSIS — K760 Fatty (change of) liver, not elsewhere classified: Secondary | ICD-10-CM | POA: Insufficient documentation

## 2015-04-24 NOTE — Therapy (Signed)
[facility] [place], [place], [address] Phone: [phone]   Fax:     Modified Barium Swallow  Patient Details  Name: Jason Fitzgerald MRN: [id number] Date of Birth: November 10, 1950 No Data Recorded  Encounter Date: 04/24/2015      End of Session - 04/24/15 1512    Visit Number 1   Number of Visits 1   Date for SLP Re-Evaluation 04/24/15   SLP Start Time 1245   SLP Stop Time  1342   SLP Time Calculation (min) 57 min   Activity Tolerance Patient tolerated treatment well      Past Medical History  Diagnosis Date  . COPD (chronic obstructive pulmonary disease) ([place])   . Mediastinal lymphadenopathy   . History of cocaine abuse   . Pulmonary fibrosis ([place])   . Chronic cough   . Benign liver cyst   . Elevated liver function tests   . Fatty liver, alcoholic   . Fibrosis, pulmonary, interstitial, diffuse ([place])   . Right inguinal hernia   . GERD (gastroesophageal reflux disease)     Past Surgical History  Procedure Laterality Date  . Hernia repair Right 1973    open  . Knee arthroscopy Right 1972  . Inguinal hernia repair Right 11/08/2014    Procedure: LAPAROSCOPIC RIGHT INGUINAL HERNIA REPAIR;  Surgeon: Hubbard Robinson, MD;  Location: [facility] ORS;  Service: General;  Laterality: Right;    There were no vitals filed for this visit.  Visit Diagnosis: Oropharyngeal dysphagia  Cough - Plan: DG SWALLOWING FUNC-SPEECH PATHOLOGY, DG SWALLOWING FUNC-SPEECH PATHOLOGY     Subjective: Patient behavior: (alertness, ability to follow instructions, etc.): Patient is very anxious regarding his cough.  Chief complaint: cough associated with eating/drinking; additional cough triggers include wake up coughing, when excited, while laughing, and with talking.    Objective:  Radiological Procedure: A videoflouroscopic evaluation of oral-preparatory, reflex initiation, and pharyngeal phases of the swallow was  performed; as well as a screening of the upper esophageal phase.  I. POSTURE: Upright in MBS chair  II. VIEW: Lateral  III. COMPENSATORY STRATEGIES: N/A  IV. BOLUSES ADMINISTERED:   Thin Liquid: 5 rapid, consecutive sips   Nectar-thick Liquid: 4 rapid, consecutive sips    Puree: 2 teaspoon presentations   Mechanical Soft:  V. RESULTS OF EVALUATION: A. ORAL PREPARATORY PHASE: (The lips, tongue, and velum are observed for strength and coordination)       **Overall Severity Rating: Mild, secondary maladaptive behaviors  B. SWALLOW INITIATION/REFLEX: (The reflex is normal if "triggered" by the time the bolus reached the base of the tongue)  **Overall Severity Rating: Within normal limits  C. PHARYNGEAL PHASE: (Pharyngeal function is normal if the bolus shows rapid, smooth, and continuous transit through the pharynx and there is no pharyngeal residue after the swallow)  **Overall Severity Rating: Within normal limits  D. LARYNGEAL PENETRATION: (Material entering into the laryngeal inlet/vestibule but not aspirated)  None  E. ASPIRATION: None  F. ESOPHAGEAL PHASE: (Screening of the upper esophagus): one episode of esophageal to pharynx backflow  ASSESSMENT: 65 year old man, with coughing associated with swallowing, is presenting with physiologically normal oropharyngeal swallowing.  With the exception of behaviors described below, oral control of the bolus including oral hold, rotary mastication, and anterior to posterior transfer are within normal limits. Timing of the pharyngeal swallow is mildly delayed, triggering at the valleculae.   Once initiated, pharyngeal aspects of swallow including hyolaryngeal excursion, tongue base retraction,  epiglottic inversion, duration/amplitude of UES opening, and laryngeal vestibule closure at the height of the swallow are within normal limits.  There was no observed pharyngeal residue, laryngeal penetration, or aspiration.  Abnormal behaviors include:  gagging on first puree bolus prior to completing posterior transfer; regurgitation of that first bolus from the esophagus into the pharynx; piecemeal swallowing of the second puree bolus (generally associated with fear of swallowing); and coughing with no observable triggering event.  The patient is at not at risk for prandial aspiration and the coughing does not appear to be associated with airway compromise.  In addition to coughing while eating/drinking, the patient identified triggers including wake up coughing, when excited, while laughing, and with talking.  PLAN/RECOMMENDATIONS:   A. Diet: usual diet   B. Swallowing Precautions: Standard   C. Recommended consultation to: follow up with treatment team as recommeded   D. Therapy recommendations N/A   E. Results and recommendations were discussed with the patient immediately following the study and final report routed to referring MD.     Problem List Patient Active Problem List   Diagnosis Date Noted  . Chronic obstructive pulmonary disease ([place]) 11/20/2014  . CAFL (chronic airflow limitation) ([place]) 10/24/2014  . Arteriosclerosis of coronary artery 10/24/2014  . History of fundoplication [id number]  . HLD (hyperlipidemia) 10/24/2014  . BP (high blood pressure) 10/24/2014  . LAD (lymphadenopathy), mediastinal 07/13/2013  . ILD (interstitial lung disease) ([place]) 06/19/2013  . Interstitial lung disease ([place]) 06/19/2013  . Postinflammatory pulmonary fibrosis ([place]) 06/15/2013   Leroy Sea, MS/CCC- SLP  Lou Miner 04/24/2015, 3:14 PM  [facility] [place], [place], [address] Phone: [phone]   Fax:     Name: Jason Fitzgerald MRN: [id number] Date of Birth: [birth year]

## 2015-05-27 ENCOUNTER — Encounter: Payer: Self-pay | Admitting: Internal Medicine

## 2015-05-27 ENCOUNTER — Ambulatory Visit (INDEPENDENT_AMBULATORY_CARE_PROVIDER_SITE_OTHER): Payer: PRIVATE HEALTH INSURANCE | Admitting: Internal Medicine

## 2015-05-27 VITALS — BP 128/74 | HR 75 | Ht 69.0 in | Wt 227.1 lb

## 2015-05-27 DIAGNOSIS — R05 Cough: Secondary | ICD-10-CM | POA: Diagnosis not present

## 2015-05-27 DIAGNOSIS — J849 Interstitial pulmonary disease, unspecified: Secondary | ICD-10-CM | POA: Diagnosis not present

## 2015-05-27 DIAGNOSIS — J432 Centrilobular emphysema: Secondary | ICD-10-CM

## 2015-05-27 DIAGNOSIS — R053 Chronic cough: Secondary | ICD-10-CM

## 2015-05-27 NOTE — Progress Notes (Signed)
[facility] Consultation    Date: 05/27/2015  MRN# [id number] Jason Fitzgerald 08/01/1950  Referring Physician: Dr. Anda Latina  Jason Fitzgerald is a 65 y.o. old male seen in consultation for second opinion of chronic cough.  CC:  Chief Complaint  Patient presents with  . pulmonary consult    pt ref by dr. Tami Ribas for chronic cough.  pt c/o prod cough clear in colorX4y. occ SOB. denies wheezing or cp/tightness.    HPI:  Patient is a pleasant 65 year old male past medical history of pulmonary fibrosis, COPD, cocaine use in the past,chronic cough, presenting for a visit of second opinion from ENT physician for chronic cough. Patient states that he's had chronic cough for at least 4 years, it is accompanied at times with thick sputum production usually white. He has not been hospitalized for any pneumonia, and upper respiratory tract infection or respiratory distress in the last 1-2 years to his knowledge. Review of chart shows that he follows with Dr. Vella Kohler for COPD, pulmonary fibrosis, mediastinal nodes (status post negative biopsy). States that he works third shift, and usually has cough when he wakes up from sleep or shortly after eating meals. He has had a barium swallow which does not show any significant aspiration/reflux. He is on a pump inhibitor for suspected acid reflux disease. Does not endorse any significant allergies. States that he has smoked cocaine for a total of about 20 years, but has not used any in the last 5 years. He denies any tobacco use, or alcohol use. 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 He has been on Symbicort for a number of years, but is currently using it as a rescue inhaler,once per day, if needed.     Dr. Raul Del Visit 03/2015 History of Present Illness: Jason Fitzgerald is a 65 y.o. male presents to clinic for recheck. He has copd, rheumatoid arthritis,  pulmonary fibrosis, mediastinal nodes, s/p -ve EBUS. Couighs after meals and on awakening. He has no post nasal drainage but reflux is present. He denies hemoptysis, pleurisy, weight loss, fever or chills. No ectopy, syncope, calf pain or edema.   Current Medications:  Current Outpatient Prescriptions  Medication Sig Dispense Refill  . albuterol 90 mcg/actuation inhaler Inhale 2 inhalations into the lungs every 6 (six) hours as needed for Wheezing. 1 Inhaler 12  . hydroxychloroquine (PLAQUENIL) 200 mg tablet Take 1 tablet (200 mg total) by mouth once daily. 30 tablet 2  . SYMBICORT 80-4.5 mcg/actuation inhaler INHALE 2 PUFFS BY MOUTH EVERY 12 HOURS 30.6 Inhaler 3   No current facility-administered medications for this visit.   Problem List:  Patient Active Problem List  Diagnosis  . COPD (chronic obstructive pulmonary disease) , unspecified (CMS-HCC)  . Hypertension  . Hyperlipidemia, unspecified  . Coronary artery disease  . Postinflammatory pulmonary fibrosis (CMS-HCC)  . Interstitial lung disease (CMS-HCC)  . Mediastinal adenopathy  . History of fundoplication   History: Past Medical History  Diagnosis Date  . Asthma without status asthmaticus  . Chronic cough  . COPD (chronic obstructive pulmonary disease) , unspecified (CMS-HCC)  . GERD (gastroesophageal reflux disease)  . Hyperlipidemia, unspecified  . Hypertension  . Substance abuse   General: NAD. Able to speak in complete sentences without cough or dyspnea HEENT: Normocephalic, nontraumatic. Extraocular movements intact NECK: Supple. No JVD, nodes, thyromegaly CV: RRR no murmurs, gallops, rubs PULM: Normal respiratory effort, Clear  to auscultation bilaterally without wheezing or crackles ABD: Benign exam EXTREMITIES: No significant edema, cyanosis or Homans'signs SKIN: Fair turgor. 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 COPD (chronic obstructive pulmonary disease) ([place])   . Mediastinal lymphadenopathy   . History of cocaine abuse   . Pulmonary fibrosis ([place])   . Chronic cough   . Elevated liver function tests   . Fibrosis, pulmonary, interstitial, diffuse ([place])   . Right inguinal hernia   . GERD (gastroesophageal reflux disease)    Surgical Hx:  Past Surgical History  Procedure Laterality Date  . Hernia repair Right 1973    open  . Knee arthroscopy Right 1972  . Inguinal hernia repair Right 11/08/2014    Procedure: LAPAROSCOPIC RIGHT INGUINAL HERNIA REPAIR;  Surgeon: Hubbard Robinson, MD;  Location: [facility] ORS;  Service: General;  Laterality: Right;   Family Hx:  Family History  Problem Relation Age of Onset  . Diabetes Mother   . Cancer Father    Social Hx:   Social History  Substance Use Topics  . Smoking status: Never Smoker   . Smokeless tobacco: Never Used  . Alcohol Use: 0.0 oz/week    0 Standard drinks or equivalent per week     Comment: 7 quarts of beer a week   Medication:   Current Outpatient Rx  Name  Route  Sig  Dispense  Refill  . budesonide-formoterol (SYMBICORT) 80-4.5 MCG/ACT inhaler   Oral   Take 2 puffs by mouth as needed. As needed.         . hydroxychloroquine (PLAQUENIL) 200 MG tablet   Oral   Take 1 tablet by mouth 1 day or 1 dose. Takes at 2330.         Marland Kitchen oxyCODONE-acetaminophen (ROXICET) 5-325 MG tablet   Oral   Take 1-2 tablets  by mouth every 4 (four) hours as needed for moderate pain or severe pain.   40 tablet   0   . pantoprazole (PROTONIX) 40 MG tablet   Oral   Take 1 tablet by mouth 1 day or 1 dose. In am             Allergies:  Review of patient's allergies indicates no known allergies.  Review of Systems  Constitutional: Negative for fever and chills.  HENT: Positive for sore throat.   Eyes: Negative for blurred vision and double vision.  Respiratory: Positive for cough, sputum production and shortness of breath.        Chronic cough, sputum production, and SOB  Cardiovascular: Negative for chest pain.  Gastrointestinal: Negative for heartburn, nausea and vomiting.  Genitourinary: Negative for dysuria.  Neurological: Negative for dizziness and headaches.  Endo/Heme/Allergies: Does not bruise/bleed easily.  Psychiatric/Behavioral: Negative for depression.     Physical Examination:   VS: BP 128/74 mmHg  Pulse 75  Ht 5\' 9"  (  1.753 m)  Wt 227 lb 1.6 oz (103.012 kg)  BMI 33.52 kg/m2  SpO2 96%  General Appearance: No distress  Neuro:without focal findings, mental status, speech normal, alert and oriented, cranial nerves 2-12 intact, reflexes normal and symmetric, sensation grossly normal  HEENT: PERRLA, EOM intact, no ptosis, no other lesions noticed; Mallampati 2 Pulmonary: coarse upper airway sounds, diaphragmatic excursion normal, fine dry basilar crackles. .No wheezing, No rales;   Sputum Production:  Mild, thick white CardiovascularNormal S1,S2.  No m/r/g.  Abdominal aorta pulsation normal.    Abdomen: Benign, Soft, non-tender, No masses, hepatosplenomegaly, No lymphadenopathy Renal:  No costovertebral tenderness  GU:  No performed at this time. Endoc: No evident thyromegaly, no signs of acromegaly or Cushing features Skin:   warm, no rashes, no ecchymosis  Extremities: normal, no cyanosis, clubbing, no edema, warm with normal capillary refill. Other findings:none     Rad results:  (The following images and results were reviewed by Dr. Stevenson Clinch on 05/27/2015). CT Chest 09/2014 Stable chronic pulmonary interstitial fibrosis and bilateral  pulmonary nodules.  Stable mild mediastinal and bilateral hilar lymphadenopathy.  No new or progressive disease identified within the thorax.  Barium swallow 04/24/2015-negative for any reflux or aspiration events.   Assessment and Plan:65 year old male seen in consultation for second opinion of chronic cough, follows with Dr. Vella Kohler for COPD, interstitial lung disease (UIP), shortness of breath. Chronic cough Chronic cough over the last 4 years, gradually worsening, worse with waking her from sleep and after eating. Workup include ENT evaluation and negative barium swallow.  Cough at this time is multifactorial: COPD, interstitial lung disease, possible upper airway disease (being evaluated by ENT), GI causes  At this time he has been educated on proper use of Symbicort and not to be used as a rescue inhaler. He is avidly followed by Dr. Vella Kohler who was managing her COPD, interstitial lung disease, and shortness of breath. He has mediastinal adenopathy that has been worked up and is currently negative by biopsy. 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 Further evaluation by ENT to be performed. Review of chart shows that Dr. Vella Kohler has recently re-refered  patient to GI, Dr. Verita Lamb patient has not follow up on this recent referral and has not seen GI in a number of years. Reiterated to patient that he should followup with GI also.   Plan: -Continue with COPD treatment, iodine treatment as dictated by Dr. Vella Kohler. -ENT continued workup -may need further evaluation by GI given that he is continued to have cough especially after eating, could have silent reflux or esophageal dysmotility.  Patient already has GI referral.   Interstitial lung disease ([place]) Patient with  known interstitial lung disease/UIP. Currently with supportive management, has stable CAT scans, being followed by Dr. Vella Kohler. No further workup needed at this time  Chronic obstructive pulmonary disease ([place]) COPD on treatment. Per Dr. Joanell Rising note he is currently on Symbicort and albuterol as needed. However after today's visit is clear that there is a compliance issue. I have reeducated the patient on use of maintenance versus rescue inhalers. I advised him that he should use Symbicort as a maintenance inhaler, at minimum 1 puff twice a day.  Plan: -Tobacco avoid it -Avoid noxious substances -Continue with Symbicort and as needed albuterol   Thank you for consult. At this time the patient has multiple factors for chronic cough, he is currently being followed by Dr. Vella Kohler who is managing his COPD/ILD. Patient already  has a CAT scan of chest schedule in August 2017 and no further workup is needed for COPD/ILD. Patient is advised to followup as needed, and to continue his regular followup with Dr. Vella Kohler.   Updated Medication List Outpatient Encounter Prescriptions as of 05/27/2015  Medication Sig  . budesonide-formoterol (SYMBICORT) 80-4.5 MCG/ACT inhaler Take 2 puffs by mouth as needed. As needed.  . hydroxychloroquine (PLAQUENIL) 200 MG tablet Take 1 tablet by mouth 1 day or 1 dose. Takes at 2330.  Marland Kitchen oxyCODONE-acetaminophen (ROXICET) 5-325 MG tablet Take 1-2 tablets by mouth every 4 (four) hours as needed for moderate pain or severe pain.  . pantoprazole (PROTONIX) 40 MG tablet Take 1 tablet by mouth 1 day or 1 dose. In am   No facility-administered encounter medications on file as of 05/27/2015.    Orders for this visit: No orders of the defined types were placed in this encounter.     Thank  you for the consultation and for allowing [facility] to assist in the care of your patient. Our recommendations are noted above.  Please contact us if we  can be of further service.   Vilinda Boehringer, MD [facility] Office Number: [phone]  Note: This note was prepared with Dragon dictation along with smaller phrase technology. Any transcriptional errors that result from this process are unintentional.

## 2015-05-27 NOTE — Patient Instructions (Addendum)
Follow up with Dr. Stevenson Clinch as needed - cont tobacco cessation - cont with your COPD regiment as directed by Dr. Raul Del (Symbicort 80/4.5) - Please use Symbicort as directed for maximal benefit - 2 puff twice a day, gargle and rinse after each use. DO NOT USE as a rescue inhaler - keep follow up appointment with your GI doctor

## 2015-05-29 DIAGNOSIS — R053 Chronic cough: Secondary | ICD-10-CM | POA: Insufficient documentation

## 2015-05-29 DIAGNOSIS — R05 Cough: Secondary | ICD-10-CM | POA: Insufficient documentation

## 2015-05-29 NOTE — Assessment & Plan Note (Signed)
Patient with known interstitial lung disease/UIP. Currently with supportive management, has stable CAT scans, being followed by Dr. Vella Kohler. No further workup needed at this time

## 2015-05-29 NOTE — Assessment & Plan Note (Addendum)
Chronic cough over the last 4 years, gradually worsening, worse with waking her from sleep and after eating. Workup include ENT evaluation and negative barium swallow.  Cough at this time is multifactorial: COPD, interstitial lung disease, possible upper airway disease (being evaluated by ENT), GI causes  At this time he has been educated on proper use of Symbicort and not to be used as a rescue inhaler. He is avidly followed by Dr. Vella Kohler who was managing her COPD, interstitial lung disease, and shortness of breath. He has mediastinal adenopathy that has been worked up and is currently negative by biopsy. 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 Further evaluation by ENT to be performed. Review of chart shows that Dr. Vella Kohler has recently re-refered  patient to GI, Dr. Verita Lamb patient has not follow up on this recent referral and has not seen GI in a number of years. Reiterated to patient that he should followup with GI also.   Plan: -Continue with COPD treatment, iodine treatment as dictated by Dr. Vella Kohler. -ENT continued workup -may need further evaluation by GI given that he is continued to have cough especially after eating, could have silent reflux or esophageal dysmotility.  Patient already has GI referral.

## 2015-05-29 NOTE — Assessment & Plan Note (Signed)
COPD on treatment. Per Dr. Joanell Rising note he is currently on Symbicort and albuterol as needed. However after today's visit is clear that there is a compliance issue. I have reeducated the patient on use of maintenance versus rescue inhalers. I advised him that he should use Symbicort as a maintenance inhaler, at minimum 1 puff twice a day.  Plan: -Tobacco avoid it -Avoid noxious substances -Continue with Symbicort and as needed albuterol

## 2015-09-17 ENCOUNTER — Ambulatory Visit
Admission: RE | Admit: 2015-09-17 | Discharge: 2015-09-17 | Disposition: A | Discharge status: Home / Self Care | Payer: PRIVATE HEALTH INSURANCE | Source: Ambulatory Visit | Attending: Specialist | Admitting: Specialist

## 2015-09-17 DIAGNOSIS — J849 Interstitial pulmonary disease, unspecified: Secondary | ICD-10-CM | POA: Insufficient documentation

## 2015-09-17 DIAGNOSIS — K760 Fatty (change of) liver, not elsewhere classified: Secondary | ICD-10-CM | POA: Diagnosis not present

## 2015-09-17 DIAGNOSIS — I700 Atherosclerosis of aorta: Secondary | ICD-10-CM | POA: Insufficient documentation

## 2015-09-17 DIAGNOSIS — I2510 Atherosclerotic heart disease of native coronary artery without angina pectoris: Secondary | ICD-10-CM | POA: Diagnosis not present

## 2016-06-26 ENCOUNTER — Encounter: Payer: Self-pay | Admitting: Family Medicine

## 2016-06-26 ENCOUNTER — Ambulatory Visit (INDEPENDENT_AMBULATORY_CARE_PROVIDER_SITE_OTHER): Payer: PRIVATE HEALTH INSURANCE | Admitting: Family Medicine

## 2016-06-26 VITALS — BP 127/73 | HR 77 | Temp 98.0°F | Resp 16 | Ht 69.0 in | Wt 217.4 lb

## 2016-06-26 DIAGNOSIS — M545 Low back pain, unspecified: Secondary | ICD-10-CM

## 2016-06-26 DIAGNOSIS — M5440 Lumbago with sciatica, unspecified side: Secondary | ICD-10-CM | POA: Diagnosis not present

## 2016-06-26 MED ORDER — METAXALONE 800 MG PO TABS: 800.0000 mg | ORAL_TABLET | Freq: Two times a day (BID) | ORAL | 0 refills | Status: AC | PRN

## 2016-06-26 NOTE — Progress Notes (Signed)
Name: Jason Fitzgerald   MRN: [id number]    DOB: November 06, 1950   Date:06/26/2016       Progress Note  Subjective  Chief Complaint  Chief Complaint  Patient presents with  . Back Pain    lower left side x3 weeks radiating down to leg    Back Pain  This is a new problem. The current episode started 1 to 4 weeks ago (3 weeks ago). The problem occurs daily. The problem has been gradually worsening since onset. The pain is present in the lumbar spine. The quality of the pain is described as shooting. The pain radiates to the left knee and left thigh. The pain is the same all the time. The symptoms are aggravated by standing (walking and prolonged standing makes it worse). Associated symptoms include leg pain. Pertinent negatives include no bladder incontinence, bowel incontinence, numbness, paresthesias or perianal numbness. He has tried NSAIDs (has taken Advil to relieve the pain) for the symptoms.     Past Medical History:  Diagnosis Date  . Chronic cough   . COPD (chronic obstructive pulmonary disease) ([place])   . Elevated liver function tests   . Fibrosis, pulmonary, interstitial, diffuse ([place])   . GERD (gastroesophageal reflux disease)   . History of cocaine abuse   . Mediastinal lymphadenopathy   . Pulmonary fibrosis ([place])   . Right inguinal hernia     Past Surgical History:  Procedure Laterality Date  . HERNIA REPAIR Right 1973   open  . INGUINAL HERNIA REPAIR Right 11/08/2014   Procedure: LAPAROSCOPIC RIGHT INGUINAL HERNIA REPAIR;  Surgeon: Hubbard Robinson, MD;  Location: [facility] ORS;  Service: General;  Laterality: Right;  . knee arthroscopy Right 1972    Family History  Problem Relation Age of Onset  . Diabetes Mother   . Cancer Father     Social History   Social History  . Marital status: Divorced    Spouse name: N/A  . Number of children: N/A  . Years of education: N/A   Occupational History  . Not on file.   Social History Main Topics  . Smoking status: Never  Smoker  . Smokeless tobacco: Never Used  . Alcohol use 0.0 oz/week     Comment: 7 quarts of beer a week  . Drug use: No     Comment: History of cocaine abuse  . Sexual activity: Not on file   Other Topics Concern  . Not on file   Social History Narrative  . No narrative on file     Current Outpatient Prescriptions:  .  budesonide-formoterol (SYMBICORT) 80-4.5 MCG/ACT inhaler, Take 2 puffs by mouth as needed. As needed., Disp: , Rfl:  .  pantoprazole (PROTONIX) 40 MG tablet, Take 1 tablet by mouth 1 day or 1 dose. In am, Disp: , Rfl:   No Known Allergies   Review of Systems  Gastrointestinal: Negative for bowel incontinence.  Genitourinary: Negative for bladder incontinence.  Musculoskeletal: Positive for back pain.  Neurological: Negative for numbness and paresthesias.     Objective  Vitals:   06/26/16 1103  BP: 127/73  Pulse: 77  Resp: 16  Temp: 98 F (36.7 C)  TempSrc: Oral  SpO2: 97%  Weight: 217 lb 6.4 oz (98.6 kg)  Height: 5\' 9"  (1.753 m)    Physical Exam  Constitutional: He is well-developed, well-nourished, and in no distress.  Cardiovascular: Normal rate, regular rhythm and normal heart sounds.   No murmur heard. Pulmonary/Chest: Effort normal and breath sounds  normal.  Abdominal: Soft. Bowel sounds are normal. There is no tenderness.  Musculoskeletal:       Lumbar back: He exhibits tenderness, pain and spasm.       Back:  Psychiatric: Mood, memory, affect and judgment normal.  Nursing note and vitals reviewed.    Assessment & Plan  1. Low back pain with radiation Likely associated with muscle spasm, start on Skelaxin, obtain x-ray of lumbar spine - DG Lumbar Spine Complete; Future - metaxalone (SKELAXIN) 800 MG tablet; Take 1 tablet (800 mg total) by mouth 2 (two) times daily as needed for muscle spasms.  Dispense: 14 tablet; Refill: 0   Alarik Radu Asad A. [facility] 06/26/2016 11:12 AM

## 2016-06-30 ENCOUNTER — Ambulatory Visit
Admission: RE | Admit: 2016-06-30 | Discharge: 2016-06-30 | Disposition: A | Discharge status: Home / Self Care | Payer: PRIVATE HEALTH INSURANCE | Source: Ambulatory Visit | Attending: Family Medicine | Admitting: Family Medicine

## 2016-06-30 DIAGNOSIS — M5136 Other intervertebral disc degeneration, lumbar region: Secondary | ICD-10-CM | POA: Insufficient documentation

## 2016-06-30 DIAGNOSIS — M5440 Lumbago with sciatica, unspecified side: Secondary | ICD-10-CM | POA: Insufficient documentation

## 2016-06-30 DIAGNOSIS — M545 Low back pain, unspecified: Secondary | ICD-10-CM

## 2016-07-02 ENCOUNTER — Telehealth: Payer: Self-pay | Admitting: Family Medicine

## 2016-07-02 ENCOUNTER — Other Ambulatory Visit: Payer: Self-pay | Admitting: Family Medicine

## 2016-07-02 DIAGNOSIS — M47816 Spondylosis without myelopathy or radiculopathy, lumbar region: Secondary | ICD-10-CM | POA: Insufficient documentation

## 2016-07-02 MED ORDER — MELOXICAM 15 MG PO TABS: 15.0000 mg | ORAL_TABLET | Freq: Every day | ORAL | 2 refills | Status: DC

## 2016-07-02 NOTE — Telephone Encounter (Signed)
Patient has been notified of x-ray results

## 2016-07-02 NOTE — Telephone Encounter (Signed)
Pt requesting X Ray results.

## 2016-07-31 ENCOUNTER — Telehealth: Payer: Self-pay | Admitting: Family Medicine

## 2016-07-31 DIAGNOSIS — M1712 Unilateral primary osteoarthritis, left knee: Secondary | ICD-10-CM

## 2016-07-31 NOTE — Telephone Encounter (Signed)
PER YOU I AM TO SEND YOU A REMINDER TO DO A REFERRAL TO ORTHO THO GET THE PATIENT AN INJECTION ON L KNEE. WOULD PREFER MORNING AFTER 8:30 AM . NO FRIDAYS.

## 2016-08-01 DIAGNOSIS — M1712 Unilateral primary osteoarthritis, left knee: Secondary | ICD-10-CM | POA: Insufficient documentation

## 2016-08-01 NOTE — Telephone Encounter (Signed)
Referral to orthopedics is entered

## 2016-09-23 ENCOUNTER — Other Ambulatory Visit: Payer: Self-pay | Admitting: Specialist

## 2016-09-23 DIAGNOSIS — R590 Localized enlarged lymph nodes: Secondary | ICD-10-CM

## 2016-09-23 DIAGNOSIS — R059 Cough, unspecified: Secondary | ICD-10-CM

## 2016-09-23 DIAGNOSIS — J849 Interstitial pulmonary disease, unspecified: Secondary | ICD-10-CM

## 2016-09-23 DIAGNOSIS — R05 Cough: Secondary | ICD-10-CM

## 2016-09-30 ENCOUNTER — Emergency Department
Admission: EM | Admit: 2016-09-30 | Discharge: 2016-09-30 | Disposition: A | Discharge status: Home / Self Care | Payer: PRIVATE HEALTH INSURANCE | Attending: Emergency Medicine | Admitting: Emergency Medicine

## 2016-09-30 ENCOUNTER — Encounter: Payer: Self-pay | Admitting: Emergency Medicine

## 2016-09-30 DIAGNOSIS — J449 Chronic obstructive pulmonary disease, unspecified: Secondary | ICD-10-CM | POA: Diagnosis not present

## 2016-09-30 DIAGNOSIS — Z79899 Other long term (current) drug therapy: Secondary | ICD-10-CM | POA: Insufficient documentation

## 2016-09-30 DIAGNOSIS — M19012 Primary osteoarthritis, left shoulder: Secondary | ICD-10-CM | POA: Insufficient documentation

## 2016-09-30 DIAGNOSIS — M47817 Spondylosis without myelopathy or radiculopathy, lumbosacral region: Secondary | ICD-10-CM | POA: Insufficient documentation

## 2016-09-30 DIAGNOSIS — M47892 Other spondylosis, cervical region: Secondary | ICD-10-CM | POA: Insufficient documentation

## 2016-09-30 DIAGNOSIS — M47812 Spondylosis without myelopathy or radiculopathy, cervical region: Secondary | ICD-10-CM

## 2016-09-30 DIAGNOSIS — M2550 Pain in unspecified joint: Secondary | ICD-10-CM | POA: Diagnosis present

## 2016-09-30 LAB — CBC WITH DIFFERENTIAL/PLATELET
Basophils Absolute: 0.1 10*3/uL (ref 0–0.1)
Basophils Relative: 1 %
EOS ABS: 0.1 10*3/uL (ref 0–0.7)
EOS PCT: 2 %
HCT: 36.1 % — ABNORMAL LOW (ref 40.0–52.0)
Hemoglobin: 12.4 g/dL — ABNORMAL LOW (ref 13.0–18.0)
LYMPHS ABS: 1.5 10*3/uL (ref 1.0–3.6)
Lymphocytes Relative: 19 %
MCH: 30 pg (ref 26.0–34.0)
MCHC: 34.4 g/dL (ref 32.0–36.0)
MCV: 87.3 fL (ref 80.0–100.0)
MONO ABS: 0.8 10*3/uL (ref 0.2–1.0)
Monocytes Relative: 10 %
Neutro Abs: 5.4 10*3/uL (ref 1.4–6.5)
Neutrophils Relative %: 68 %
PLATELETS: 400 10*3/uL (ref 150–440)
RBC: 4.13 MIL/uL — AB (ref 4.40–5.90)
RDW: 13.3 % (ref 11.5–14.5)
WBC: 7.9 10*3/uL (ref 3.8–10.6)

## 2016-09-30 LAB — BASIC METABOLIC PANEL
Anion gap: 11 (ref 5–15)
BUN: 13 mg/dL (ref 6–20)
CHLORIDE: 100 mmol/L — AB (ref 101–111)
CO2: 25 mmol/L (ref 22–32)
CREATININE: 0.73 mg/dL (ref 0.61–1.24)
Calcium: 8.9 mg/dL (ref 8.9–10.3)
GFR calc Af Amer: >60 mL/min (ref >60)
GFR calc non Af Amer: >60 mL/min (ref >60)
GLUCOSE: 109 mg/dL — AB (ref 65–99)
Potassium: 3.1 mmol/L — ABNORMAL LOW (ref 3.5–5.1)
SODIUM: 136 mmol/L (ref 135–145)

## 2016-09-30 LAB — URIC ACID: URIC ACID, SERUM: 6.5 mg/dL (ref 4.4–7.6)

## 2016-09-30 MED ORDER — DICLOFENAC SODIUM 75 MG PO TBEC
75.0000 mg | DELAYED_RELEASE_TABLET | Freq: Once | ORAL | Status: AC
  Administered 2016-09-30: 75 mg via ORAL
  Filled 2016-09-30: qty 1

## 2016-09-30 MED ORDER — DICLOFENAC SODIUM 50 MG PO TBEC: 50.0000 mg | DELAYED_RELEASE_TABLET | Freq: Two times a day (BID) | ORAL | 1 refills | Status: DC

## 2016-09-30 NOTE — ED Notes (Signed)
E-signature pad not functional. 

## 2016-09-30 NOTE — Discharge Instructions (Signed)
Your exam shows some decreased range of motion of the hands, neck, back, and shoulders. Your symptoms are consistent with arthritis. Your labs were normal and there is no indication of gout. Take the prescription anti-inflammatory as directed. Follow-up with your provider for continued symptoms.

## 2016-09-30 NOTE — ED Notes (Signed)
Spoke with Dr. Mable Paris in regards to patients presentation. No orders. States patient is flex appropriate.

## 2016-09-30 NOTE — ED Notes (Signed)
Pt states that his " whole entire body is hurting". Started getting worse this week.

## 2016-09-30 NOTE — ED Triage Notes (Signed)
Patient presents to ED via POV from home with c/o "pain all over". Patient unable to pick one certain area or body group. Patient denies CP or SOB. Patient denies N/V/D.

## 2016-09-30 NOTE — ED Provider Notes (Signed)
[facility] Emergency Department Provider Note ____________________________________________  Time seen: 73  I have reviewed the triage vital signs and the nursing notes.  HISTORY  Chief Complaint  Generalized Body Aches  HPI Jason Fitzgerald is a 66 y.o. male presented himself to the ED for evaluation of generalized body pain. The patient when asked be more specific, describes generalized joint pain, an underlying history of arthritis and low back. He describes pain and swelling to his hands, knees, ankles, and legs. He also reports some fluid retention and that is currently being treated with fluid pills. He is under the care of Dr. Manuella Ghazi at [facility] neurology, but denies any current treatment for his degenerative arthritis. He is concerned that he may have pain from OA or gout. He denies a previous diagnosis of gout. He has taken Advil primarily, but denies any effectiveness. He denies any recent injury, accident, or trauma. He also denies any recent fevers, chills, sweats, or illness. He is scheduled to be evaluated in next 3 months for his routine physical exam.  Past Medical History:  Diagnosis Date  . Chronic cough   . COPD (chronic obstructive pulmonary disease) ([place])   . Elevated liver function tests   . Fibrosis, pulmonary, interstitial, diffuse ([place])   . GERD (gastroesophageal reflux disease)   . History of cocaine abuse   . Mediastinal lymphadenopathy   . Pulmonary fibrosis ([place])   . Right inguinal hernia     Patient Active Problem List   Diagnosis Date Noted  . Arthritis of left knee 08/01/2016  . 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 Chronic cough 05/29/2015  . Chronic obstructive pulmonary disease ([place]) 11/20/2014  . CAFL (chronic airflow limitation) ([place]) 10/24/2014  . Arteriosclerosis of coronary artery 10/24/2014  . History of fundoplication 37/62/8315  . HLD (hyperlipidemia) 10/24/2014  . BP (high blood pressure) 10/24/2014  .  LAD (lymphadenopathy), mediastinal 07/13/2013  . ILD (interstitial lung disease) ([place]) 06/19/2013  . Interstitial lung disease ([place]) 06/19/2013  . Postinflammatory pulmonary fibrosis ([place]) 06/15/2013    Past Surgical History:  Procedure Laterality Date  . HERNIA REPAIR Right 1973   open  . INGUINAL HERNIA REPAIR Right 11/08/2014   Procedure: LAPAROSCOPIC RIGHT INGUINAL HERNIA REPAIR;  Surgeon: Hubbard Robinson, MD;  Location: [facility] ORS;  Service: General;  Laterality: Right;  . knee arthroscopy Right 1972    Prior to Admission medications   Medication Sig Start Date End Date Taking? Authorizing Provider  budesonide-formoterol (SYMBICORT) 80-4.5 MCG/ACT inhaler Take 2 puffs by mouth as needed. As needed. 01/01/14   [provider]  diclofenac (VOLTAREN) 50 MG EC tablet Take 1 tablet (50 mg total) by mouth 2 (two) times daily. 09/30/16   Celeste Tavenner, Dannielle Karvonen, PA-C  meloxicam (MOBIC) 15 MG tablet Take 1 tablet (15 mg total) by mouth daily. 07/02/16   Roselee Nova, MD  pantoprazole (PROTONIX) 40 MG tablet Take 1 tablet by mouth 1 day or 1 dose. In am 09/19/14   [provider]   Allergies Patient has no known allergies.  Family History  Problem Relation Age of Onset  . Diabetes Mother   . Cancer Father     Social History Social History  Substance Use Topics  . Smoking status: Never Smoker  . Smokeless tobacco: Never Used  . Alcohol use 0.0 oz/week     Comment: 2 quarts a week    Review of Systems  Constitutional: Negative for fever. Cardiovascular: Negative for chest  pain. Chronic peripheral edema.  Respiratory: Negative for shortness of breath. Musculoskeletal: Negative for back pain. Generalized joint pains  Skin: Negative for rash. Neurological: Negative for headaches, focal weakness or numbness. ____________________________________________  PHYSICAL EXAM:  VITAL SIGNS: ED Triage Vitals  Enc Vitals Group     BP 09/30/16 1814 (!) 156/84      Pulse Rate 09/30/16 1814 81     Resp 09/30/16 1814 17     Temp 09/30/16 1814 98.7 F (37.1 C)     Temp Source 09/30/16 1814 Oral     SpO2 09/30/16 1814 95 %     Weight 09/30/16 1814 221 lb (100.2 kg)     Height  09/30/16 1814 5\' 9"  (1.753 m)     Head Circumference --      Peak Flow --      Pain Score 09/30/16 1813 10     Pain Loc --      Pain Edu? --      Excl. in [place]? --     Constitutional: Alert and oriented. Well appearing and in no distress. Head: Normocephalic and atraumatic. Neck: Supple. No thyromegaly. Cardiovascular: Normal rate, regular rhythm. Normal distal pulses. Bilateral hand swelling without mild erythema. 1+ pitting edema bilaterally at the LE. Respiratory: Normal respiratory effort. No wheezes/rales/rhonchi. Gastrointestinal: Soft and nontender. No distention. Musculoskeletal: Normal spinal alignment without midline tenderness, spasm, deformity, or step-off. Nontender with severely decreased range of motion in the left shoulder. Neck range is also decreased with right-sided pain. Grip strength is decreased to 3+ bilaterally. Normal ROM in all other extremities.  Neurologic:  Normal gait without ataxia. Normal speech and language. No gross focal neurologic deficits are appreciated. Skin:  Skin is warm, dry and intact. No rash noted. ____________________________________________   LABS (pertinent positives/negatives)  Labs Reviewed  BASIC METABOLIC PANEL - Abnormal; Notable for the following:       Result Value   Potassium 3.1 (*)    Chloride 100 (*)    Glucose, Bld 109 (*)    All other components within normal limits  CBC WITH DIFFERENTIAL/PLATELET - Abnormal; Notable for the following:    RBC 4.13 (*)    Hemoglobin 12.4 (*)    HCT 36.1 (*)    All other components within normal limits  URIC ACID  ____________________________________________   RADIOLOGY  Lumbar Spine (06/2016) IMPRESSION: Slight degenerative facet arthritis in the lower lumbar spine.  The S1 segment is lumbarized.  I, Ladora Osterberg, Dannielle Karvonen, personally viewed and evaluated these images (plain radiographs) as part of my medical decision making, as well as reviewing the written report by the radiologist. ____________________________________________  PROCEDURES  Voltaren 75 mg PO ____________________________________________  INITIAL IMPRESSION / ASSESSMENT AND PLAN / ED COURSE  Patient eating evaluation of generalized joint pain secondary to arthritis. His exam is benign and her labs are reassuring at this time. No indication of any acute gouty arthritis. He will be discharged with a prescription for Voltaren. He should follow-up with his provider for ongoing management.  ____________________________________________  FINAL CLINICAL IMPRESSION(S) / ED DIAGNOSES  Final diagnoses:  Primary osteoarthritis of left shoulder  Spondylosis of cervical region without myelopathy or radiculopathy  Spondylosis of lumbosacral region without myelopathy or radiculopathy      Carmie End, Dannielle Karvonen, PA-C 09/30/16 2117    Darel Hong, MD 09/30/16 2137

## 2016-10-15 ENCOUNTER — Ambulatory Visit (INDEPENDENT_AMBULATORY_CARE_PROVIDER_SITE_OTHER): Payer: PRIVATE HEALTH INSURANCE | Admitting: Family Medicine

## 2016-10-15 ENCOUNTER — Encounter: Payer: Self-pay | Admitting: Family Medicine

## 2016-10-15 VITALS — BP 138/78 | HR 110 | Temp 98.1°F | Resp 17 | Ht 69.0 in | Wt 211.5 lb

## 2016-10-15 DIAGNOSIS — M05742 Rheumatoid arthritis with rheumatoid factor of left hand without organ or systems involvement: Secondary | ICD-10-CM

## 2016-10-15 DIAGNOSIS — M05741 Rheumatoid arthritis with rheumatoid factor of right hand without organ or systems involvement: Secondary | ICD-10-CM | POA: Diagnosis not present

## 2016-10-15 DIAGNOSIS — M170 Bilateral primary osteoarthritis of knee: Secondary | ICD-10-CM

## 2016-10-15 MED ORDER — TRAMADOL HCL 50 MG PO TABS: 50.0000 mg | ORAL_TABLET | Freq: Two times a day (BID) | ORAL | 0 refills | Status: AC | PRN

## 2016-10-15 NOTE — Progress Notes (Signed)
Name: Jason Fitzgerald   MRN: [id number]    DOB: 1950-02-06   Date:10/15/2016       Progress Note  Subjective  Chief Complaint  Chief Complaint  Patient presents with  . Annual Exam    CPE    HPI  Pt. Presents with complaints of arthralgias, specifically involving his shoulders and knees, he reports pain and swelling in his hands. Reports being in the ER for pain all over his body but more so in his knees and shoulders, did receive an injection in his left knee which reportedly made it worse.  He has history of rheumatoid arthritis and was on Plaquenil by Dr . Jefm Bryant 2 years ago.    Past Medical History:  Diagnosis Date  . Chronic cough   . COPD (chronic obstructive pulmonary disease) ([place])   . Elevated liver function tests   . Fibrosis, pulmonary, interstitial, diffuse ([place])   . GERD (gastroesophageal reflux disease)   . History of cocaine abuse   . Mediastinal lymphadenopathy   . Pulmonary fibrosis ([place])   . Right inguinal hernia     Past Surgical History:  Procedure Laterality Date  . HERNIA REPAIR Right 1973   open  . INGUINAL HERNIA REPAIR Right 11/08/2014   Procedure: LAPAROSCOPIC RIGHT INGUINAL HERNIA REPAIR;  Surgeon: Hubbard Robinson, MD;  Location: [facility] ORS;  Service: General;  Laterality: Right;  . knee arthroscopy Right 1972    Family History  Problem Relation Age of Onset  . Diabetes Mother   . Cancer Father     Social History   Social History  . Marital status: Divorced    Spouse name: N/A  . Number of children: N/A  . Years of education: N/A   Occupational History  . Not on file.   Social History Main Topics  . Smoking status: Never Smoker  . Smokeless tobacco: Never Used  . Alcohol use 0.0 oz/week     Comment: 2 quarts a week  . Drug use: No     Comment: History of cocaine abuse  . Sexual activity: Not on file   Other Topics Concern  . Not on file   Social History Narrative  . No narrative on file     Current Outpatient  Prescriptions:  .  budesonide-formoterol (SYMBICORT) 80-4.5 MCG/ACT inhaler, Take 2 puffs by mouth as needed. As needed., Disp: , Rfl:  .  diclofenac (VOLTAREN) 50 MG EC tablet, Take 1 tablet (50 mg total) by mouth 2 (two) times daily., Disp: 30 tablet, Rfl: 1 .  pantoprazole (PROTONIX) 40 MG tablet, Take 1 tablet by mouth 1 day or 1 dose. In am, Disp: , Rfl:   No Known Allergies   ROS  Please see history of present illness for complete description of ROS  Objective  Vitals:   10/15/16 1032  BP: 138/78  Pulse: (!) 110  Resp: 17  Temp: 98.1 F (36.7 C)  TempSrc: Oral  SpO2: 95%  Weight: 211 lb 8 oz (95.9 kg)  Height: 5' 9"  (1.753 m)    Physical Exam  Constitutional: He is oriented to person, place, and time and well-developed, well-nourished, and in no distress.  Cardiovascular: Normal rate, regular rhythm and normal heart sounds.   No murmur heard. Pulmonary/Chest: Effort normal and breath sounds normal. He has no wheezes.  Musculoskeletal:       Right knee: He exhibits swelling. Tenderness found.       Left knee: He exhibits swelling. Tenderness found.  Neurological: He  is alert and oriented to person, place, and time.  Nursing note and vitals reviewed.     Recent Results (from the past 2160 hour(s))  Basic metabolic panel     Status: Abnormal   Collection Time: 09/30/16  7:43 PM  Result Value Ref Range   Sodium 136 135 - 145 mmol/L   Potassium 3.1 (L) 3.5 - 5.1 mmol/L   Chloride 100 (L) 101 - 111 mmol/L   CO2 25 22 - 32 mmol/L   Glucose, Bld 109 (H) 65 - 99 mg/dL   BUN 13 6 - 20 mg/dL   Creatinine, Ser 0.73 0.61 - 1.24 mg/dL   Calcium 8.9 8.9 - 10.3 mg/dL   GFR calc non Af Amer >60 >60 mL/min   GFR calc Af Amer >60 >60 mL/min    Comment: (NOTE) The eGFR has been calculated using the CKD EPI equation. This calculation has not been validated in all clinical situations. eGFR's persistently <60 mL/min signify possible Chronic Kidney Disease.    Anion gap 11  5 - 15  CBC with Differential     Status: Abnormal   Collection Time: 09/30/16  7:43 PM  Result Value Ref Range   WBC 7.9 3.8 - 10.6 K/uL   RBC 4.13 (L) 4.40 - 5.90 MIL/uL   Hemoglobin 12.4 (L) 13.0 - 18.0 g/dL   HCT 36.1 (L) 40.0 - 52.0 %   MCV 87.3 80.0 - 100.0 fL   MCH 30.0 26.0 - 34.0 pg   MCHC 34.4 32.0 - 36.0 g/dL   RDW 13.3 11.5 - 14.5 %   Platelets 400 150 - 440 K/uL   Neutrophils Relative % 68 %   Neutro Abs 5.4 1.4 - 6.5 K/uL   Lymphocytes Relative 19 %   Lymphs Abs 1.5 1.0 - 3.6 K/uL   Monocytes Relative 10 %   Monocytes Absolute 0.8 0.2 - 1.0 K/uL   Eosinophils Relative 2 %   Eosinophils Absolute 0.1 0 - 0.7 K/uL   Basophils Relative 1 %   Basophils Absolute 0.1 0 - 0.1 K/uL  Uric acid     Status: None   Collection Time: 09/30/16  7:43 PM  Result Value Ref Range   Uric Acid, Serum 6.5 4.4 - 7.6 mg/dL     Assessment & Plan  1. Rheumatoid arthritis involving both hands with positive rheumatoid factor ([place]) Suspect his main symptoms of pain are mostly from rheumatoid arthritis, he has been on Plaquenil before, we will start on tramadol for 5 days after reviewing  [place] controlled substances registry system. To Dr. Jefm Bryant in rheumatology - Ambulatory referral to Rheumatology - traMADol (ULTRAM) 50 MG tablet; Take 1 tablet (50 mg total) by mouth every 12 (twelve) hours as needed for moderate pain or severe pain.  Dispense: 10 tablet; Refill: 0  2. Osteoarthritis of both knees, unspecified osteoarthritis type As above, diclofenac is not effective and hence will start on tramadol 50 mg twice a day - traMADol (ULTRAM) 50 MG tablet; Take 1 tablet (50 mg total) by mouth every 12 (twelve) hours as needed for moderate pain or severe pain.  Dispense: 10 tablet; Refill: 0   Hindy Perrault [facility] 10/15/2016 10:44 AM

## 2017-03-04 ENCOUNTER — Inpatient Hospital Stay
Admission: EM | Admit: 2017-03-04 | Discharge: 2017-03-06 | DRG: 286 | Disposition: A | Discharge status: Home / Self Care | Payer: PRIVATE HEALTH INSURANCE | Attending: Internal Medicine | Admitting: Internal Medicine

## 2017-03-04 ENCOUNTER — Inpatient Hospital Stay
Admit: 2017-03-04 | Discharge: 2017-03-04 | Disposition: A | Discharge status: Home / Self Care | Payer: PRIVATE HEALTH INSURANCE | Attending: Cardiovascular Disease | Admitting: Cardiovascular Disease

## 2017-03-04 ENCOUNTER — Ambulatory Visit: Payer: PRIVATE HEALTH INSURANCE | Admitting: Family Medicine

## 2017-03-04 ENCOUNTER — Emergency Department: Payer: PRIVATE HEALTH INSURANCE

## 2017-03-04 ENCOUNTER — Encounter: Payer: Self-pay | Admitting: Family Medicine

## 2017-03-04 ENCOUNTER — Other Ambulatory Visit: Payer: Self-pay

## 2017-03-04 ENCOUNTER — Encounter: Payer: Self-pay | Admitting: Emergency Medicine

## 2017-03-04 VITALS — BP 132/76 | HR 63 | Temp 98.4°F | Resp 16 | Wt 218.9 lb

## 2017-03-04 DIAGNOSIS — I272 Pulmonary hypertension, unspecified: Secondary | ICD-10-CM | POA: Diagnosis present

## 2017-03-04 DIAGNOSIS — M0630 Rheumatoid nodule, unspecified site: Secondary | ICD-10-CM | POA: Diagnosis not present

## 2017-03-04 DIAGNOSIS — Z7952 Long term (current) use of systemic steroids: Secondary | ICD-10-CM | POA: Diagnosis not present

## 2017-03-04 DIAGNOSIS — J8410 Pulmonary fibrosis, unspecified: Secondary | ICD-10-CM | POA: Diagnosis present

## 2017-03-04 DIAGNOSIS — J449 Chronic obstructive pulmonary disease, unspecified: Secondary | ICD-10-CM | POA: Diagnosis present

## 2017-03-04 DIAGNOSIS — I5021 Acute systolic (congestive) heart failure: Secondary | ICD-10-CM | POA: Diagnosis present

## 2017-03-04 DIAGNOSIS — J810 Acute pulmonary edema: Secondary | ICD-10-CM

## 2017-03-04 DIAGNOSIS — I959 Hypotension, unspecified: Secondary | ICD-10-CM | POA: Diagnosis not present

## 2017-03-04 DIAGNOSIS — I499 Cardiac arrhythmia, unspecified: Secondary | ICD-10-CM | POA: Diagnosis not present

## 2017-03-04 DIAGNOSIS — E876 Hypokalemia: Secondary | ICD-10-CM | POA: Diagnosis present

## 2017-03-04 DIAGNOSIS — I110 Hypertensive heart disease with heart failure: Secondary | ICD-10-CM | POA: Diagnosis present

## 2017-03-04 DIAGNOSIS — I4891 Unspecified atrial fibrillation: Secondary | ICD-10-CM | POA: Diagnosis present

## 2017-03-04 DIAGNOSIS — K219 Gastro-esophageal reflux disease without esophagitis: Secondary | ICD-10-CM | POA: Diagnosis present

## 2017-03-04 DIAGNOSIS — M06329 Rheumatoid nodule, unspecified elbow: Secondary | ICD-10-CM

## 2017-03-04 DIAGNOSIS — I248 Other forms of acute ischemic heart disease: Secondary | ICD-10-CM | POA: Diagnosis present

## 2017-03-04 DIAGNOSIS — R0601 Orthopnea: Secondary | ICD-10-CM

## 2017-03-04 DIAGNOSIS — Z79899 Other long term (current) drug therapy: Secondary | ICD-10-CM

## 2017-03-04 DIAGNOSIS — I509 Heart failure, unspecified: Secondary | ICD-10-CM

## 2017-03-04 LAB — COMPREHENSIVE METABOLIC PANEL
ALT: 11 U/L — ABNORMAL LOW (ref 17–63)
ANION GAP: 13 (ref 5–15)
AST: 24 U/L (ref 15–41)
Albumin: 3.4 g/dL — ABNORMAL LOW (ref 3.5–5.0)
Alkaline Phosphatase: 57 U/L (ref 38–126)
BILIRUBIN TOTAL: 1.3 mg/dL — AB (ref 0.3–1.2)
BUN: 15 mg/dL (ref 6–20)
CO2: 26 mmol/L (ref 22–32)
Calcium: 8.5 mg/dL — ABNORMAL LOW (ref 8.9–10.3)
Chloride: 102 mmol/L (ref 101–111)
Creatinine, Ser: 0.71 mg/dL (ref 0.61–1.24)
GFR calc Af Amer: >60 mL/min (ref >60)
Glucose, Bld: 110 mg/dL — ABNORMAL HIGH (ref 65–99)
POTASSIUM: 2.8 mmol/L — AB (ref 3.5–5.1)
Sodium: 141 mmol/L (ref 135–145)
TOTAL PROTEIN: 7.8 g/dL (ref 6.5–8.1)

## 2017-03-04 LAB — CBC
HCT: 39.8 % — ABNORMAL LOW (ref 40.0–52.0)
HEMATOCRIT: 39.6 % — AB (ref 40.0–52.0)
HEMOGLOBIN: 12.9 g/dL — AB (ref 13.0–18.0)
Hemoglobin: 12.9 g/dL — ABNORMAL LOW (ref 13.0–18.0)
MCH: 28.3 pg (ref 26.0–34.0)
MCH: 27.8 pg (ref 26.0–34.0)
MCHC: 32.5 g/dL (ref 32.0–36.0)
MCHC: 32.4 g/dL (ref 32.0–36.0)
MCV: 86.9 fL (ref 80.0–100.0)
MCV: 85.9 fL (ref 80.0–100.0)
Platelets: 256 10*3/uL (ref 150–440)
Platelets: 257 10*3/uL (ref 150–440)
RBC: 4.55 MIL/uL (ref 4.40–5.90)
RBC: 4.63 MIL/uL (ref 4.40–5.90)
RDW: 15.1 % — AB (ref 11.5–14.5)
RDW: 14.8 % — ABNORMAL HIGH (ref 11.5–14.5)
WBC: 6 10*3/uL (ref 3.8–10.6)
WBC: 5.9 10*3/uL (ref 3.8–10.6)

## 2017-03-04 LAB — BASIC METABOLIC PANEL
Anion gap: 10 (ref 5–15)
BUN: 11 mg/dL (ref 6–20)
CHLORIDE: 99 mmol/L — AB (ref 101–111)
CO2: 31 mmol/L (ref 22–32)
CREATININE: 0.66 mg/dL (ref 0.61–1.24)
Calcium: 8.7 mg/dL — ABNORMAL LOW (ref 8.9–10.3)
GFR calc Af Amer: >60 mL/min (ref >60)
Glucose, Bld: 124 mg/dL — ABNORMAL HIGH (ref 65–99)
Potassium: 2.9 mmol/L — ABNORMAL LOW (ref 3.5–5.1)
Sodium: 140 mmol/L (ref 135–145)

## 2017-03-04 LAB — ECHOCARDIOGRAM COMPLETE
Height: 72 in
Weight: 3369.6 oz

## 2017-03-04 LAB — TROPONIN I
TROPONIN I: 0.06 ng/mL — AB (ref <0.03)
Troponin I: 0.04 ng/mL (ref <0.03)

## 2017-03-04 LAB — HEPARIN LEVEL (UNFRACTIONATED)

## 2017-03-04 LAB — CREATININE, SERUM
CREATININE: 0.79 mg/dL (ref 0.61–1.24)
GFR calc Af Amer: >60 mL/min (ref >60)
GFR calc non Af Amer: >60 mL/min (ref >60)

## 2017-03-04 LAB — PROTIME-INR
INR: 1.14
INR: 1.21
PROTHROMBIN TIME: 14.5 s (ref 11.4–15.2)
Prothrombin Time: 15.2 seconds (ref 11.4–15.2)

## 2017-03-04 LAB — MAGNESIUM: Magnesium: 2 mg/dL (ref 1.7–2.4)

## 2017-03-04 LAB — BRAIN NATRIURETIC PEPTIDE: B Natriuretic Peptide: 987 pg/mL — ABNORMAL HIGH (ref 0.0–100.0)

## 2017-03-04 LAB — APTT: APTT: 34 s (ref 24–36)

## 2017-03-04 MED ORDER — ONDANSETRON HCL 4 MG/2ML IJ SOLN: 4.0000 mg | Freq: Four times a day (QID) | INTRAMUSCULAR | Status: DC | PRN

## 2017-03-04 MED ORDER — CARVEDILOL 6.25 MG PO TABS
6.2500 mg | ORAL_TABLET | Freq: Two times a day (BID) | ORAL | Status: DC
  Administered 2017-03-04: 6.25 mg via ORAL
  Filled 2017-03-04: qty 1

## 2017-03-04 MED ORDER — SODIUM CHLORIDE 0.9% FLUSH
3.0000 mL | Freq: Two times a day (BID) | INTRAVENOUS | Status: DC
  Administered 2017-03-04 – 2017-03-06 (×2): 3 mL via INTRAVENOUS

## 2017-03-04 MED ORDER — KETOROLAC TROMETHAMINE 30 MG/ML IJ SOLN
15.0000 mg | Freq: Four times a day (QID) | INTRAMUSCULAR | Status: DC | PRN
  Administered 2017-03-05 (×2): 15 mg via INTRAVENOUS
  Filled 2017-03-04 (×2): qty 1

## 2017-03-04 MED ORDER — PREDNISONE 10 MG PO TABS
5.0000 mg | ORAL_TABLET | Freq: Every day | ORAL | Status: DC
  Administered 2017-03-04 – 2017-03-06 (×2): 5 mg via ORAL
  Filled 2017-03-04 (×2): qty 1

## 2017-03-04 MED ORDER — SODIUM CHLORIDE 0.9 % WEIGHT BASED INFUSION
1.0000 mL/kg/h | INTRAVENOUS | Status: DC
  Administered 2017-03-05: 1 mL/kg/h via INTRAVENOUS

## 2017-03-04 MED ORDER — SODIUM CHLORIDE 0.9 % IV SOLN: 250.0000 mL | INTRAVENOUS | Status: DC | PRN

## 2017-03-04 MED ORDER — PANTOPRAZOLE SODIUM 40 MG PO TBEC
40.0000 mg | DELAYED_RELEASE_TABLET | ORAL | Status: DC
  Administered 2017-03-04 – 2017-03-05 (×2): 40 mg via ORAL
  Filled 2017-03-04 (×2): qty 1

## 2017-03-04 MED ORDER — HEPARIN BOLUS VIA INFUSION
3000.0000 [IU] | Freq: Once | INTRAVENOUS | Status: AC
  Administered 2017-03-04: 3000 [IU] via INTRAVENOUS
  Filled 2017-03-04: qty 3000

## 2017-03-04 MED ORDER — HEPARIN BOLUS VIA INFUSION
4600.0000 [IU] | Freq: Once | INTRAVENOUS | Status: AC
  Administered 2017-03-04: 4600 [IU] via INTRAVENOUS
  Filled 2017-03-04: qty 4600

## 2017-03-04 MED ORDER — BISACODYL 5 MG PO TBEC
5.0000 mg | DELAYED_RELEASE_TABLET | Freq: Every day | ORAL | Status: DC | PRN
  Filled 2017-03-04: qty 1

## 2017-03-04 MED ORDER — ONDANSETRON HCL 4 MG PO TABS: 4.0000 mg | ORAL_TABLET | Freq: Four times a day (QID) | ORAL | Status: DC | PRN

## 2017-03-04 MED ORDER — ACETAMINOPHEN 650 MG RE SUPP: 650.0000 mg | Freq: Four times a day (QID) | RECTAL | Status: DC | PRN

## 2017-03-04 MED ORDER — MOMETASONE FURO-FORMOTEROL FUM 100-5 MCG/ACT IN AERO
2.0000 | INHALATION_SPRAY | Freq: Two times a day (BID) | RESPIRATORY_TRACT | Status: DC
  Administered 2017-03-04: 2 via RESPIRATORY_TRACT
  Filled 2017-03-04: qty 8.8

## 2017-03-04 MED ORDER — DILTIAZEM HCL 100 MG IV SOLR
5.0000 mg/h | Freq: Once | INTRAVENOUS | Status: AC
  Administered 2017-03-04: 5 mg/h via INTRAVENOUS
  Filled 2017-03-04: qty 100

## 2017-03-04 MED ORDER — FUROSEMIDE 10 MG/ML IJ SOLN
40.0000 mg | Freq: Two times a day (BID) | INTRAMUSCULAR | Status: DC
  Administered 2017-03-04: 40 mg via INTRAVENOUS
  Filled 2017-03-04: qty 4

## 2017-03-04 MED ORDER — DILTIAZEM HCL 100 MG IV SOLR
5.0000 mg/h | INTRAVENOUS | Status: DC
  Administered 2017-03-04: 12.5 mg/h via INTRAVENOUS

## 2017-03-04 MED ORDER — POTASSIUM CHLORIDE CRYS ER 20 MEQ PO TBCR
40.0000 meq | EXTENDED_RELEASE_TABLET | ORAL | Status: AC
  Administered 2017-03-04 (×2): 40 meq via ORAL
  Filled 2017-03-04 (×2): qty 2

## 2017-03-04 MED ORDER — FUROSEMIDE 10 MG/ML IJ SOLN
60.0000 mg | Freq: Once | INTRAMUSCULAR | Status: AC
  Administered 2017-03-04: 60 mg via INTRAVENOUS
  Filled 2017-03-04: qty 8

## 2017-03-04 MED ORDER — DILTIAZEM HCL 25 MG/5ML IV SOLN
10.0000 mg | Freq: Once | INTRAVENOUS | Status: AC
  Administered 2017-03-04: 10 mg via INTRAVENOUS
  Filled 2017-03-04: qty 5

## 2017-03-04 MED ORDER — ACETAMINOPHEN 325 MG PO TABS: 650.0000 mg | ORAL_TABLET | Freq: Four times a day (QID) | ORAL | Status: DC | PRN

## 2017-03-04 MED ORDER — SODIUM CHLORIDE 0.9% FLUSH: 3.0000 mL | INTRAVENOUS | Status: DC | PRN

## 2017-03-04 MED ORDER — ALBUTEROL SULFATE (2.5 MG/3ML) 0.083% IN NEBU: 2.5000 mg | INHALATION_SOLUTION | RESPIRATORY_TRACT | Status: DC | PRN

## 2017-03-04 MED ORDER — SPIRONOLACTONE 25 MG PO TABS
25.0000 mg | ORAL_TABLET | Freq: Every day | ORAL | Status: DC
  Administered 2017-03-04: 25 mg via ORAL
  Filled 2017-03-04 (×2): qty 1

## 2017-03-04 MED ORDER — DILTIAZEM HCL 25 MG/5ML IV SOLN
10.0000 mg | Freq: Once | INTRAVENOUS | Status: AC
  Administered 2017-03-04: 10 mg via INTRAVENOUS

## 2017-03-04 MED ORDER — ASPIRIN 81 MG PO CHEW
81.0000 mg | CHEWABLE_TABLET | ORAL | Status: AC
  Administered 2017-03-05: 81 mg via ORAL
  Filled 2017-03-04: qty 1

## 2017-03-04 MED ORDER — SODIUM CHLORIDE 0.9% FLUSH: 3.0000 mL | Freq: Two times a day (BID) | INTRAVENOUS | Status: DC

## 2017-03-04 MED ORDER — HYDROCODONE-ACETAMINOPHEN 5-325 MG PO TABS: 1.0000 | ORAL_TABLET | ORAL | Status: DC | PRN

## 2017-03-04 MED ORDER — LISINOPRIL 5 MG PO TABS: 5.0000 mg | ORAL_TABLET | Freq: Every day | ORAL | Status: DC

## 2017-03-04 MED ORDER — HYDROXYCHLOROQUINE SULFATE 200 MG PO TABS
400.0000 mg | ORAL_TABLET | Freq: Every day | ORAL | Status: DC
  Administered 2017-03-04 – 2017-03-06 (×2): 400 mg via ORAL
  Filled 2017-03-04 (×3): qty 2

## 2017-03-04 MED ORDER — DIGOXIN 0.25 MG/ML IJ SOLN
0.2500 mg | INTRAMUSCULAR | Status: AC
  Administered 2017-03-04: 0.25 mg via INTRAVENOUS
  Filled 2017-03-04: qty 1

## 2017-03-04 MED ORDER — AMIODARONE LOAD VIA INFUSION
150.0000 mg | Freq: Once | INTRAVENOUS | Status: AC
  Administered 2017-03-04: 150 mg via INTRAVENOUS
  Filled 2017-03-04: qty 83.34

## 2017-03-04 MED ORDER — LEFLUNOMIDE 20 MG PO TABS
10.0000 mg | ORAL_TABLET | Freq: Every day | ORAL | Status: DC
  Administered 2017-03-04 – 2017-03-06 (×2): 10 mg via ORAL
  Filled 2017-03-04 (×3): qty 1

## 2017-03-04 MED ORDER — AMIODARONE HCL IN DEXTROSE 360-4.14 MG/200ML-% IV SOLN
60.0000 mg/h | INTRAVENOUS | Status: AC
  Administered 2017-03-04: 60 mg/h via INTRAVENOUS
  Filled 2017-03-04 (×2): qty 200

## 2017-03-04 MED ORDER — SENNOSIDES-DOCUSATE SODIUM 8.6-50 MG PO TABS: 1.0000 | ORAL_TABLET | Freq: Every evening | ORAL | Status: DC | PRN

## 2017-03-04 MED ORDER — SODIUM CHLORIDE 0.9 % WEIGHT BASED INFUSION
3.0000 mL/kg/h | INTRAVENOUS | Status: AC
  Administered 2017-03-05: 3 mL/kg/h via INTRAVENOUS

## 2017-03-04 MED ORDER — AMIODARONE HCL IN DEXTROSE 360-4.14 MG/200ML-% IV SOLN
30.0000 mg/h | INTRAVENOUS | Status: DC
  Administered 2017-03-04 – 2017-03-05 (×2): 30 mg/h via INTRAVENOUS
  Filled 2017-03-04: qty 200

## 2017-03-04 MED ORDER — HEPARIN (PORCINE) IN NACL 100-0.45 UNIT/ML-% IJ SOLN
1650.0000 [IU]/h | INTRAMUSCULAR | Status: DC
  Administered 2017-03-04: 1300 [IU]/h via INTRAVENOUS
  Administered 2017-03-05: 1500 [IU]/h via INTRAVENOUS
  Filled 2017-03-04 (×2): qty 250

## 2017-03-04 MED ORDER — SACUBITRIL-VALSARTAN 24-26 MG PO TABS
1.0000 | ORAL_TABLET | Freq: Two times a day (BID) | ORAL | Status: DC
  Administered 2017-03-04 – 2017-03-05 (×2): 1 via ORAL
  Filled 2017-03-04 (×3): qty 1

## 2017-03-04 NOTE — ED Notes (Signed)
Patient transported to X-ray 

## 2017-03-04 NOTE — Progress Notes (Signed)
*  PRELIMINARY RESULTS* Echocardiogram 2D Echocardiogram has been performed.  Sherrie Sport 03/04/2017, 2:55 PM

## 2017-03-04 NOTE — H&P (Signed)
[facility] [place] NAME: Jason Fitzgerald    MR#:  [id number]  DATE OF BIRTH:  06-Nov-1950  DATE OF ADMISSION:  03/04/2017  PRIMARY CARE PHYSICIAN: Roselee Nova, MD   REQUESTING/REFERRING PHYSICIAN: Harvest Dark, MD  CHIEF COMPLAINT:   Chief Complaint  Patient presents with  . EKG changes   Worsening shortness of breath HISTORY OF PRESENT ILLNESS:  Jason Fitzgerald  is a 68 y.o. male with a known history of multiple medical problems as below.  The patient was sent to ED by his primary care doctor due to EKG EKG changes.  He went to PCPs office for routine follow-up for his arthritis and was found tachycardia.  He complains of progressive shortness of breath and leg edema for the past 2-3 weeks.  He also complains of orthopnea and nocturnal dyspnea.  He was found A. fib with RVR, given Cardizem IV 2 doses in the ED, but heart rate is still about 120-130s.  Chest x-ray showed congestion.  BNP is elevated.  PAST MEDICAL HISTORY:   Past Medical History:  Diagnosis Date  . Chronic cough   . COPD (chronic obstructive pulmonary disease) ([place])   . Elevated liver function tests   . Fibrosis, pulmonary, interstitial, diffuse ([place])   . GERD (gastroesophageal reflux disease)   . History of cocaine abuse   . Mediastinal lymphadenopathy   . Pulmonary fibrosis ([place])   . Right inguinal hernia     PAST SURGICAL HISTORY:   Past Surgical History:  Procedure Laterality Date  . HERNIA REPAIR Right 1973   open  . INGUINAL HERNIA REPAIR Right 11/08/2014   Procedure: LAPAROSCOPIC RIGHT INGUINAL HERNIA REPAIR;  Surgeon: Hubbard Robinson, MD;  Location: [facility] ORS;  Service: General;  Laterality: Right;  . knee arthroscopy Right 1972    SOCIAL HISTORY:   Social History   Tobacco Use  . Smoking status: Never Smoker  . Smokeless tobacco: Never Used  Substance Use Topics  . Alcohol use: Yes    Alcohol/week: 0.0 oz    Comment: 2 quarts a  week    FAMILY HISTORY:   Family History  Problem Relation Age of Onset  . Diabetes Mother   . Cancer Father   . AAA (abdominal aortic aneurysm) Brother     DRUG ALLERGIES:  No Known Allergies  REVIEW OF SYSTEMS:   Review of Systems  Constitutional: Positive for malaise/fatigue. Negative for chills and fever.  HENT: Negative for sore throat.   Eyes: Negative for blurred vision and double vision.  Respiratory: Positive for cough, sputum production and shortness of breath. Negative for hemoptysis, wheezing and stridor.   Cardiovascular: Positive for orthopnea and leg swelling. Negative for chest pain and palpitations.  Gastrointestinal: Negative for abdominal pain, blood in stool, diarrhea, melena, nausea and vomiting.  Genitourinary: Negative for dysuria, flank pain and hematuria.  Musculoskeletal: Negative for back pain and joint pain.  Neurological: Negative for dizziness, sensory change, focal weakness, seizures, loss of consciousness, weakness and headaches.  Endo/Heme/Allergies: Negative for polydipsia.  Psychiatric/Behavioral: Negative for depression. The patient is not nervous/anxious.     MEDICATIONS AT HOME:   Prior to Admission medications   Medication Sig Start Date End Date Taking? Authorizing Provider  albuterol (PROVENTIL HFA) 108 (90 Base) MCG/ACT inhaler Inhale 2 puffs into the lungs as needed. 03/24/16 03/24/17 Yes [provider]  budesonide-formoterol (SYMBICORT) 80-4.5 MCG/ACT inhaler Take 2 puffs by mouth as needed. As needed. 01/01/14  Yes [provider]  hydroxychloroquine (PLAQUENIL) 200 MG tablet Take 2 tablets by mouth daily. 01/23/17  Yes [provider]  leflunomide (ARAVA) 10 MG tablet Take 1 tablet by mouth daily. 12/03/16  Yes [provider]  pantoprazole (PROTONIX) 40 MG tablet Take 1 tablet by mouth 1 day or 1 dose. In am 09/19/14  Yes [provider]  predniSONE (DELTASONE) 5 MG tablet Take 1 tablet by  mouth daily. 01/22/17  Yes [provider]  diclofenac (VOLTAREN) 50 MG EC tablet Take 1 tablet (50 mg total) by mouth 2 (two) times daily. Patient not taking: Reported on 03/04/2017 09/30/16   Menshew, Dannielle Karvonen, PA-C      VITAL SIGNS:  Blood pressure (!) 140/93, pulse (!) 51, temperature 98.6 F (37 C), resp. rate (!) 21, weight 218 lb (98.9 kg), SpO2 97 %.  PHYSICAL EXAMINATION:  Physical Exam  GENERAL:  67 y.o.-year-old patient lying in the bed with no acute distress.  EYES: Pupils equal, round, reactive to light and accommodation. No scleral icterus. Extraocular muscles intact.  HEENT: Head atraumatic, normocephalic. Oropharynx and nasopharynx clear.  NECK:  Supple, no jugular venous distention. No thyroid enlargement, no tenderness.  LUNGS: Normal breath sounds bilaterally, no wheezing, but has rales. No use of accessory muscles of respiration.  CARDIOVASCULAR: Irregular rate and rhythm, tachycardia, no murmurs, rubs, or gallops.  ABDOMEN: Soft, nontender, nondistended. Bowel sounds present. No organomegaly or mass.  EXTREMITIES: No cyanosis, or clubbing.  Bilateral leg edema. NEUROLOGIC: Cranial nerves II through XII are intact. Muscle strength 5/5 in all extremities. Sensation intact. Gait not checked.  PSYCHIATRIC: The patient is alert and oriented x 3.  SKIN: No obvious rash, lesion, or ulcer.   LABORATORY PANEL:   CBC Recent Labs  Lab 03/04/17 1012  WBC 6.0  HGB 12.9*  HCT 39.6*  PLT 256   ------------------------------------------------------------------------------------------------------------------  Chemistries  Recent Labs  Lab 03/04/17 1012  NA 141  K 2.8*  CL 102  CO2 26  GLUCOSE 110*  BUN 15  CREATININE 0.71  CALCIUM 8.5*  AST 24  ALT 11*  ALKPHOS 57  BILITOT 1.3*   ------------------------------------------------------------------------------------------------------------------  Cardiac Enzymes Recent Labs  Lab 03/04/17 1012    TROPONINI 0.06*   ------------------------------------------------------------------------------------------------------------------  RADIOLOGY:  Dg Chest 2 View  Result Date: 03/04/2017 CLINICAL DATA:  Patient states that he saw his PCP this morning for his hands and was referred here for his chest. He has been SOB for 3 weeks. No other chest complaints. Pulmonary fibrosis. COPD. EXAM: CHEST  2 VIEW COMPARISON:  Previous chest x-ray 03/01/2013, chest CT 09/17/2015 FINDINGS: Heart size is normal. Chronic changes are identified throughout the lung bases at the periphery, with a similar pattern compared to the previous CT exam. Opacity within lung bases has increased slightly, raising the question of progressive fibrosis or superimposed edema on chronic changes. No frank consolidations are identified. IMPRESSION: 1. Chronic honeycomb changes. 2. Possible superimposed bilateral edema or progressive fibrosis. Electronically Signed   By: Nolon Nations M.D.   On: 03/04/2017 10:57      IMPRESSION AND PLAN:   Acute CHF, unclear type The patient will be admitted to telemetry floor. Start CHF protocol, Lasix IV twice daily, echocardiogram and cardiology consult.  New onset A. fib.  With RVR.  the patient was given Cardizem 2 doses in the ED.  May start Cardizem drip if heart rate is still not controlled.  Start heparin drip for anticoagulation.  Hypokalemia.  Give  potassium supplement, follow-up BMP and magnesium level. Elevated troponin.  Possible due to demanding ischemia, follow-up troponin level. COPD.  Stable, nebulizer as needed.   All the records are reviewed and case discussed with ED provider. Management plans discussed with the patient, family and they are in agreement.  CODE STATUS: Full code  TOTAL TIME TAKING CARE OF THIS PATIENT: 55 minutes.    Demetrios Loll M.D on 03/04/2017 at 11:54 AM  Between 7am to 6pm - Pager - [phone]  After 6pm go to [email or web address] - [employer] [place] [facility]  Office  [phone]  CC: Primary care physician; Roselee Nova, MD   Note: This dictation was prepared with Dragon dictation along with smaller phrase technology. Any transcriptional errors that result from this process are unin

## 2017-03-04 NOTE — Progress Notes (Signed)
Pharmacy RN Communication  Spoke to RN, Junious Dresser, regarding the need for potassium replacement in the setting of atrial fibrillation with amiodarone onboard and digoxin dose ordered.   Ulice Dash, PharmD Clinical Pharmacist

## 2017-03-04 NOTE — Progress Notes (Signed)
ANTICOAGULATION CONSULT NOTE - Initial Consult  Pharmacy Consult for heparin Indication: atrial fibrillation  No Known Allergies  Patient Measurements: Weight: 218 lb (98.9 kg) Heparin Dosing Weight: 91.5 kg  Vital Signs: Temp: 98.6 F (37 C) (01/31 1003) Temp Source: Oral (01/31 0844) BP: 140/93 (01/31 1130) Pulse Rate: 51 (01/31 1030)  Labs: Recent Labs    03/04/17 1012  HGB 12.9*  HCT 39.6*  PLT 256  CREATININE 0.71  TROPONINI 0.06*    Estimated Creatinine Clearance: 106.8 mL/min (by C-G formula based on SCr of 0.71 mg/dL).   Medical History: Past Medical History:  Diagnosis Date  . Chronic cough   . COPD (chronic obstructive pulmonary disease) ([place])   . Elevated liver function tests   . Fibrosis, pulmonary, interstitial, diffuse ([place])   . GERD (gastroesophageal reflux disease)   . History of cocaine abuse   . Mediastinal lymphadenopathy   . Pulmonary fibrosis ([place])   . Right inguinal hernia     Medications:  Infusions:  . diltiazem (CARDIZEM) infusion    . heparin      Assessment: 21 yom sent by PCP d/t ECG changes (tachycardic). PMH COPD, pulmonary fibrosis, gastric reflux, HTN, HLD. Pharmacy consulted to dose heparin for AF. No PTA OAC listed.  Goal of Therapy:  Heparin level 0.3-0.7 units/ml Monitor platelets by anticoagulation protocol: Yes   Plan:  Give 4600 units bolus x 1 Start heparin infusion at 1300 units/hr Check anti-Xa level in 6 hours and daily while on heparin Continue to monitor H&H and platelets  Laural Benes, Pharm.D., BCPS Clinical Pharmacist 03/04/2017,12:00 PM

## 2017-03-04 NOTE — Consult Note (Signed)
Jason Fitzgerald is a 67 y.o. male  [id number]  Primary Cardiologist: [place]  Reason for Consultation: CHF  HPI: 34 YOBM presented to [facility] with SOB, palpitation and orthopnea.   Review of Systems: PND and no chest pain   Past Medical History:  Diagnosis Date  . Chronic cough   . COPD (chronic obstructive pulmonary disease) ([place])   . Elevated liver function tests   . Fibrosis, pulmonary, interstitial, diffuse ([place])   . GERD (gastroesophageal reflux disease)   . History of cocaine abuse   . Mediastinal lymphadenopathy   . Pulmonary fibrosis ([place])   . Right inguinal hernia     Medications Prior to Admission  Medication Sig Dispense Refill  . albuterol (PROVENTIL HFA) 108 (90 Base) MCG/ACT inhaler Inhale 2 puffs into the lungs as needed.    . budesonide-formoterol (SYMBICORT) 80-4.5 MCG/ACT inhaler Take 2 puffs by mouth as needed. As needed.    . hydroxychloroquine (PLAQUENIL) 200 MG tablet Take 2 tablets by mouth daily.  1  . leflunomide (ARAVA) 10 MG tablet Take 1 tablet by mouth daily.    . pantoprazole (PROTONIX) 40 MG tablet Take 1 tablet by mouth 1 day or 1 dose. In am    . predniSONE (DELTASONE) 5 MG tablet Take 1 tablet by mouth daily.    . diclofenac (VOLTAREN) 50 MG EC tablet Take 1 tablet (50 mg total) by mouth 2 (two) times daily. (Patient not taking: Reported on 03/04/2017) 30 tablet 1     . [COMPLETED] diltiazem  10 mg Intravenous Once  . furosemide  40 mg Intravenous Q12H  . hydroxychloroquine  400 mg Oral Daily  . leflunomide  10 mg Oral Daily  . lisinopril  5 mg Oral Daily  . mometasone-formoterol  2 puff Inhalation BID  . pantoprazole  40 mg Oral 1 day or 1 dose  . potassium chloride  40 mEq Oral Q4H  . predniSONE  5 mg Oral Daily  . sodium chloride flush  3 mL Intravenous Q12H    Infusions: . sodium chloride    . amiodarone 60 mg/hr (03/04/17 1534)  . amiodarone    . diltiazem (CARDIZEM) infusion Stopped (03/04/17 1526)  . heparin 1,300  Units/hr (03/04/17 1424)    No Known Allergies  Social History   Socioeconomic History  . Marital status: Divorced    Spouse name: Not on file  . Number of children: Not on file  . Years of education: Not on file  . Highest education level: Not on file  Social Needs  . Financial resource strain: Not on file  . Food insecurity - worry: Not on file  . Food insecurity - inability: Not on file  . Transportation needs - medical: Not on file  . Transportation needs - non-medical: Not on file  Occupational History  . Not on file  Tobacco Use  . Smoking status: Never Smoker  . Smokeless tobacco: Never Used  Substance and Sexual Activity  . Alcohol use: Yes    Alcohol/week: 0.0 oz    Comment: 2 quarts a week  . Drug use: No    Comment: History of cocaine abuse  . Sexual activity: Not on file  Other Topics Concern  . Not on file  Social History Narrative  . Not on file    Family History  Problem Relation Age of Onset  . Diabetes Mother   . Cancer Father   . AAA (abdominal aortic aneurysm) Brother     PHYSICAL  EXAM: Vitals:   03/04/17 1500 03/04/17 1515  BP: (!) 109/39 122/75  Pulse: (!) 101 96  Resp:    Temp:    SpO2:       Intake/Output Summary (Last 24 hours) at 03/04/2017 1546 Last data filed at 03/04/2017 1429 Gross per 24 hour  Intake -  Output 2350 ml  Net -2350 ml    General:  Well appearing. No respiratory difficulty HEENT: normal Neck: supple. no JVD. Carotids 2+ bilat; no bruits. No lymphadenopathy or thryomegaly appreciated. Cor: PMI nondisplaced. Regular rate & rhythm. No rubs, gallops or murmurs. Lungs: clear Abdomen: soft, nontender, nondistended. No hepatosplenomegaly. No bruits or masses. Good bowel sounds. Extremities: no cyanosis, clubbing, rash, edema Neuro: alert & oriented x 3, cranial nerves grossly intact. moves all 4 extremities w/o difficulty. Affect pleasant.  ECG: Atrial fib with RVR  Results for orders placed or performed  during the hospital encounter of 03/04/17 (from the past 24 hour(s))  CBC     Status: Abnormal   Collection Time: 03/04/17 10:12 AM  Result Value Ref Range   WBC 6.0 3.8 - 10.6 K/uL   RBC 4.55 4.40 - 5.90 MIL/uL   Hemoglobin 12.9 (L) 13.0 - 18.0 g/dL   HCT 39.6 (L) 40.0 - 52.0 %   MCV 86.9 80.0 - 100.0 fL   MCH 28.3 26.0 - 34.0 pg   MCHC 32.5 32.0 - 36.0 g/dL   RDW 15.1 (H) 11.5 - 14.5 %   Platelets 256 150 - 440 K/uL  Comprehensive metabolic panel     Status: Abnormal   Collection Time: 03/04/17 10:12 AM  Result Value Ref Range   Sodium 141 135 - 145 mmol/L   Potassium 2.8 (L) 3.5 - 5.1 mmol/L   Chloride 102 101 - 111 mmol/L   CO2 26 22 - 32 mmol/L   Glucose, Bld 110 (H) 65 - 99 mg/dL   BUN 15 6 - 20 mg/dL   Creatinine, Ser 0.71 0.61 - 1.24 mg/dL   Calcium 8.5 (L) 8.9 - 10.3 mg/dL   Total Protein 7.8 6.5 - 8.1 g/dL   Albumin 3.4 (L) 3.5 - 5.0 g/dL   AST 24 15 - 41 U/L   ALT 11 (L) 17 - 63 U/L   Alkaline Phosphatase 57 38 - 126 U/L   Total Bilirubin 1.3 (H) 0.3 - 1.2 mg/dL   GFR calc non Af Amer >60 >60 mL/min   GFR calc Af Amer >60 >60 mL/min   Anion gap 13 5 - 15  Troponin I     Status: Abnormal   Collection Time: 03/04/17 10:12 AM  Result Value Ref Range   Troponin I 0.06 (HH) <0.03 ng/mL  Brain natriuretic peptide     Status: Abnormal   Collection Time: 03/04/17 10:12 AM  Result Value Ref Range   B Natriuretic Peptide 987.0 (H) 0.0 - 100.0 pg/mL  APTT     Status: None   Collection Time: 03/04/17 10:12 AM  Result Value Ref Range   aPTT 34 24 - 36 seconds  Protime-INR     Status: None   Collection Time: 03/04/17 10:12 AM  Result Value Ref Range   Prothrombin Time 14.5 11.4 - 15.2 seconds   INR 1.14   Creatinine, serum     Status: None   Collection Time: 03/04/17  1:18 PM  Result Value Ref Range   Creatinine, Ser 0.79 0.61 - 1.24 mg/dL   GFR calc non Af Amer >60 >60 mL/min   GFR  calc Af Amer >60 >60 mL/min  Magnesium     Status: None   Collection Time:  03/04/17  1:18 PM  Result Value Ref Range   Magnesium 2.0 1.7 - 2.4 mg/dL  Troponin I     Status: Abnormal   Collection Time: 03/04/17  1:18 PM  Result Value Ref Range   Troponin I 0.04 (HH) <0.03 ng/mL   Dg Chest 2 View  Result Date: 03/04/2017 CLINICAL DATA:  Patient states that he saw his PCP this morning for his hands and was referred here for his chest. He has been SOB for 3 weeks. No other chest complaints. Pulmonary fibrosis. COPD. EXAM: CHEST  2 VIEW COMPARISON:  Previous chest x-ray 03/01/2013, chest CT 09/17/2015 FINDINGS: Heart size is normal. Chronic changes are identified throughout the lung bases at the periphery, with a similar pattern compared to the previous CT exam. Opacity within lung bases has increased slightly, raising the question of progressive fibrosis or superimposed edema on chronic changes. No frank consolidations are identified. IMPRESSION: 1. Chronic honeycomb changes. 2. Possible superimposed bilateral edema or progressive fibrosis. Electronically Signed   By: Nolon Nations M.D.   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 Will do cath and start entersto and coreg/aldactone. Franziska Podgurski A

## 2017-03-04 NOTE — Progress Notes (Signed)
Name: Jason Fitzgerald   MRN: [id number]    DOB: 1950/09/14   Date:03/04/2017       Progress Note  Subjective  Chief Complaint  Chief Complaint  Patient presents with  . knots    bottom right arm; pt states sometimes they are painful on and off; onset couple months   . Shortness of Breath    Three weeks when he lay down  . Dizziness    Last couple days   . Leg Swelling    Two weeks pt states it seems like fluid is in his legs.     Shortness of Breath  This is a new problem. The current episode started 1 to 4 weeks ago (started 3 weeks ago.). The problem has been unchanged. Associated symptoms include orthopnea and sputum production. Pertinent negatives include no chest pain, fever or hemoptysis. He has tried steroid inhalers and beta agonist inhalers for the symptoms. His past medical history is significant for chronic lung disease, COPD and pneumonia.   Pt. Has noticed multiple nodular masses appearing on his right arm, present for 2-3 months, he had Rheumatoid Arthritis and is seen by Rheumatologist. Currently on Hydroxychloroquine, Prednisone, and Leflunomide.   Past Medical History:  Diagnosis Date  . Chronic cough   . COPD (chronic obstructive pulmonary disease) ([place])   . Elevated liver function tests   . Fibrosis, pulmonary, interstitial, diffuse ([place])   . GERD (gastroesophageal reflux disease)   . History of cocaine abuse   . Mediastinal lymphadenopathy   . Pulmonary fibrosis ([place])   . Right inguinal hernia     Past Surgical History:  Procedure Laterality Date  . HERNIA REPAIR Right 1973   open  . INGUINAL HERNIA REPAIR Right 11/08/2014   Procedure: LAPAROSCOPIC RIGHT INGUINAL HERNIA REPAIR;  Surgeon: Hubbard Robinson, MD;  Location: [facility] ORS;  Service: General;  Laterality: Right;  . knee arthroscopy Right 1972    Family History  Problem Relation Age of Onset  . Diabetes Mother   . Cancer Father   . AAA (abdominal aortic aneurysm) Brother     Social History    Socioeconomic History  . Marital status: Divorced    Spouse name: Not on file  . Number of children: Not on file  . Years of education: Not on file  . Highest education level: Not on file  Social Needs  . Financial resource strain: Not on file  . Food insecurity - worry: Not on file  . Food insecurity - inability: Not on file  . Transportation needs - medical: Not on file  . Transportation needs - non-medical: Not on file  Occupational History  . Not on file  Tobacco Use  . Smoking status: Never Smoker  . Smokeless tobacco: Never Used  Substance and Sexual Activity  . Alcohol use: Yes    Alcohol/week: 0.0 oz    Comment: 2 quarts a week  . Drug use: No    Comment: History of cocaine abuse  . Sexual activity: Not on file  Other Topics Concern  . Not on file  Social History Narrative  . Not on file     Current Outpatient Medications:  .  albuterol (PROVENTIL HFA) 108 (90 Base) MCG/ACT inhaler, Inhale 2 puffs into the lungs as needed., Disp: , Rfl:  .  budesonide-formoterol (SYMBICORT) 80-4.5 MCG/ACT inhaler, Take 2 puffs by mouth as needed. As needed., Disp: , Rfl:  .  hydroxychloroquine (PLAQUENIL) 200 MG tablet, Take 2 tablets by mouth daily.,  Disp: , Rfl: 1 .  leflunomide (ARAVA) 10 MG tablet, Take 1 tablet by mouth daily., Disp: , Rfl:  .  pantoprazole (PROTONIX) 40 MG tablet, Take 1 tablet by mouth 1 day or 1 dose. In am, Disp: , Rfl:  .  predniSONE (DELTASONE) 5 MG tablet, Take 1 tablet by mouth daily., Disp: , Rfl:  .  diclofenac (VOLTAREN) 50 MG EC tablet, Take 1 tablet (50 mg total) by mouth 2 (two) times daily. (Patient not taking: Reported on 03/04/2017), Disp: 30 tablet, Rfl: 1  No Known Allergies   Review of Systems  Constitutional: Negative for fever.  Respiratory: Positive for sputum production and shortness of breath. Negative for hemoptysis.   Cardiovascular: Positive for orthopnea. Negative for chest pain.    Objective  Vitals:   03/04/17 0844    BP: 132/76  Pulse: 63  Resp: 16  Temp: 98.4 F (36.9 C)  TempSrc: Oral  SpO2: 93%  Weight: 218 lb 14.4 oz (99.3 kg)    Physical Exam  Constitutional: He is well-developed, well-nourished, and in no distress.  Cardiovascular: S1 normal and S2 normal. An irregular rhythm present. Tachycardia present.  No murmur heard. Pulmonary/Chest: He has no decreased breath sounds. He has rales in the right lower field and the left lower field.  Abdominal: Soft. There is no tenderness.  Musculoskeletal:       Arms: Along the right forearm, there are cystic and nodular structures along the length of the forearm, one nodule is raised above the skin, none are tender to palpation or draining.   Nursing note and vitals reviewed.    Assessment & Plan  1. Sleeps in sitting position due to orthopnea  - CBC with Differential/Platelet - COMPLETE METABOLIC PANEL WITH GFR - Pro b natriuretic peptide (BNP)  2. Irregular heart rhythm Likely A. fib, EKG reading shows PVCs, in the setting of pulmonary fibrosis, orthopnea and A. fib, if this is likely congestive heart failure, sent patient to the ER for immediate evaluation. - EKG 12-Lead - DG Chest 2 View; Future  3. Rheumatoid nodule of upper arm (HCC) Obtain x-rays of right arm and elbow for evaluation - DG Elbow Complete Right; Future - DG Forearm Right; Future   Gigi Onstad [facility] 03/04/2017 8:52 AM

## 2017-03-04 NOTE — ED Notes (Addendum)
Two unsuccessful PIV attempts per this RN for secondary access; will report to 2A RN.

## 2017-03-04 NOTE — ED Notes (Signed)
Hospitalist to bedside at this time 

## 2017-03-04 NOTE — ED Triage Notes (Signed)
Patient presents to ED via POV from pcp office. Patient sent over by Dr. Brigitte Pulse for EKG changes. EKG here shows sinus tach with PAC and PVCs. Patient denies CP. Reports SOB but states he is often SOB due to COPD.

## 2017-03-04 NOTE — ED Provider Notes (Signed)
[facility] Emergency Department Provider Note  Time seen: 10:32 AM  I have reviewed the triage vital signs and the nursing notes.   HISTORY  Chief Complaint EKG changes    HPI Jason Fitzgerald is a 67 y.o. male with a past medical history of COPD, pulmonary fibrosis, gastric reflux, hypertension, hyperlipidemia, presents to the emergency department from his primary care office for an elevated heart rate.  According to the patient he went to his primary care doctor today as a routine follow-up for his arthritis.  During his evaluation he was noted to be tachycardic and was sent to the emergency department.  Patient states over the past 3-4 weeks he has been feeling significantly short of breath especially with any type of exertion.  For the past 2-3 weeks he is not been able to lie flat at night to sleep states he has to sleep sitting upright in a chair.  For the past 1-2 weeks he is also noticed lower extremity edema which he states he has never had previously.  Denies any leg pain.  Denies any chest pain at any point.  Does state some shortness of breath, mostly with exertion or when lying flat.  States he has been coughing, but has a history of COPD and states the cough has been unchanged times years.  Denies any fever or congestion.   Past Medical History:  Diagnosis Date  . Chronic cough   . COPD (chronic obstructive pulmonary disease) ([place])   . Elevated liver function tests   . Fibrosis, pulmonary, interstitial, diffuse ([place])   . GERD (gastroesophageal reflux disease)   . History of cocaine abuse   . Mediastinal lymphadenopathy   . Pulmonary fibrosis ([place])   . Right inguinal hernia     Patient Active Problem List   Diagnosis Date Noted  . Arthritis of left knee 08/01/2016  . 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 Chronic cough 05/29/2015  . Chronic obstructive pulmonary disease ([place]) 11/20/2014  . CAFL (chronic airflow limitation) ([place])  10/24/2014  . Arteriosclerosis of coronary artery 10/24/2014  . History of fundoplication 26/94/8546  . HLD (hyperlipidemia) 10/24/2014  . BP (high blood pressure) 10/24/2014  . LAD (lymphadenopathy), mediastinal 07/13/2013  . ILD (interstitial lung disease) ([place]) 06/19/2013  . Interstitial lung disease ([place]) 06/19/2013  . Postinflammatory pulmonary fibrosis ([place]) 06/15/2013    Past Surgical History:  Procedure Laterality Date  . HERNIA REPAIR Right 1973   open  . INGUINAL HERNIA REPAIR Right 11/08/2014   Procedure: LAPAROSCOPIC RIGHT INGUINAL HERNIA REPAIR;  Surgeon: Hubbard Robinson, MD;  Location: [facility] ORS;  Service: General;  Laterality: Right;  . knee arthroscopy Right 1972    Prior to Admission medications   Medication Sig Start Date End Date Taking? Authorizing Provider  albuterol (PROVENTIL HFA) 108 (90 Base) MCG/ACT inhaler Inhale 2 puffs into the lungs as needed. 03/24/16 03/24/17  [provider]  budesonide-formoterol (SYMBICORT) 80-4.5 MCG/ACT inhaler Take 2 puffs by mouth as needed. As needed. 01/01/14   [provider]  diclofenac (VOLTAREN) 50 MG EC tablet Take 1 tablet (50 mg total) by mouth 2 (two) times daily. Patient not taking: Reported on 03/04/2017 09/30/16   Menshew, Dannielle Karvonen, PA-C  hydroxychloroquine (PLAQUENIL) 200 MG tablet Take 2 tablets by mouth daily. 01/23/17   [provider]  leflunomide (ARAVA) 10 MG tablet Take 1 tablet by mouth daily. 12/03/16   [provider]  pantoprazole (PROTONIX) 40 MG tablet Take 1  tablet by mouth 1 day or 1 dose. In am 09/19/14   [provider]  predniSONE (DELTASONE) 5 MG tablet Take 1 tablet by mouth daily. 01/22/17   [provider]    No Known Allergies  Family History  Problem Relation Age of Onset  . Diabetes Mother   . Cancer Father   . AAA (abdominal aortic aneurysm) Brother     Social History Social History   Tobacco Use  . Smoking status: Never  Smoker  . Smokeless tobacco: Never Used  Substance Use Topics  . Alcohol use: Yes    Alcohol/week: 0.0 oz    Comment: 2 quarts a week  . Drug use: No    Comment: History of cocaine abuse    Review of Systems Constitutional: Negative for fever. Eyes: Negative for visual complaints ENT: Negative for recent illness/congestion Cardiovascular: Negative for chest pain.  Denies palpitations. Respiratory: Positive for shortness of breath, worse with exertion. Gastrointestinal: Negative for abdominal pain, vomiting  Genitourinary: Negative for urinary compaints Musculoskeletal: Positive for lower extremity edema times 1-2 weeks Skin: Negative for skin complaints  Neurological: Negative for headache All other ROS negative  ____________________________________________   PHYSICAL EXAM:  VITAL SIGNS: ED Triage Vitals  Enc Vitals Group     BP 03/04/17 1003 (!) 147/76     Pulse Rate 03/04/17 1003 (!) 152     Resp 03/04/17 1003 20     Temp 03/04/17 1003 98.6 F (37 C)     Temp src --      SpO2 03/04/17 1003 97 %     Weight 03/04/17 1001 218 lb (98.9 kg)     Height --      Head Circumference --      Peak Flow --      Pain Score --      Pain Loc --      Pain Edu? --      Excl. in [place]? --     Constitutional: Alert and oriented. Well appearing and in no distress. Eyes: Normal exam ENT   Head: Normocephalic and atraumatic   Mouth/Throat: Mucous membranes are moist. Cardiovascular: Regular rhythm, rate around 150 bpm.  No obvious murmur. Respiratory: Normal respiratory effort without tachypnea nor retractions. Breath sounds are clear.  No wheeze rales or rhonchi. Gastrointestinal: Soft and nontender. No distention.   Musculoskeletal: Nontender with normal range of motion in all extremities.  1+ lower extremity edema equal bilaterally. Neurologic:  Normal speech and language. No gross focal neurologic deficits Skin:  Skin is warm, dry and intact.  Psychiatric: Mood and affect  are normal.   ____________________________________________    EKG  EKG reviewed and interpreted by myself shows what appears to be atrial fibrillation at 143 bpm.  Narrow QRS, normal axis, normal intervals, nonspecific but no concerning ST changes.  ____________________________________________    RADIOLOGY  Chest x-ray shows chronic changes with possible superimposed pulmonary edema  ____________________________________________   INITIAL IMPRESSION / ASSESSMENT AND PLAN / ED COURSE  Pertinent labs & imaging results that were available during my care of the patient were reviewed by me and considered in my medical decision making (see chart for details).  Patient presents to the emergency department for a fast heart rate.  Patient appears to be a new onset atrial fibrillation with rapid ventricular response.  Given the patient's symptoms of orthopnea, dyspnea especially with exertion and now new onset lower extremity edema.  Differential would include ACS, CHF, pneumonia.  We will closely  monitor in the emergency department, we will check labs including cardiac panel.  Will obtain a chest x-ray to further evaluate.  Given the patient's new onset atrial fibrillation with rapid ventricular response and a heart rate greater than 150 we will dose IV diltiazem.  Given the patient's new onset dyspnea, lower extremity edema and atrial fibrillation with rapid ventricular response anticipate likely admission to the hospital.  X-ray most consistent with pulmonary fibrosis with superimposed pulmonary edema.  We will start the patient on IV Lasix.  BNP is elevated consistent with CHF.  Patient remains tachycardic despite 2 rounds of IV diltiazem currently around 120-130 bpm we will start on a diltiazem drip.  Troponin is slightly elevated at 0.06 likely related to demand ischemia.  Potassium also slightly low at 2.8 we will begin oral repletion.  We will admit to the hospitalist service for further  treatment.  Patient agreeable to this plan of care.  CRITICAL CARE Performed by: Harvest Dark   Total critical care time: 30 minutes  Critical care time was exclusive of separately billable procedures and treating other patients.  Critical care was necessary to treat or prevent imminent or life-threatening deterioration.  Critical care was time spent personally by me on the following activities: development of treatment plan with patient and/or surrogate as well as nursing, discussions with consultants, evaluation of patient's response to treatment, examination of patient, obtaining history from patient or surrogate, ordering and performing treatments and interventions, ordering and review of laboratory studies, ordering and review of radiographic studies, pulse oximetry and re-evaluation of patient's condition.   ____________________________________________   FINAL CLINICAL IMPRESSION(S) / ED DIAGNOSES  New onset atrial fibrillation with rapid ventricular response Congestive heart failure Pulmonary edema   Harvest Dark, MD 03/04/17 1138

## 2017-03-04 NOTE — Progress Notes (Signed)
ANTICOAGULATION CONSULT NOTE - Initial Consult  Pharmacy Consult for heparin Indication: atrial fibrillation  No Known Allergies  Patient Measurements: Height: 6' (182.9 cm) Weight: 210 lb 9.6 oz (95.5 kg) IBW/kg (Calculated) : 77.6 Heparin Dosing Weight: 91.5 kg  Vital Signs: Temp: 98.4 F (36.9 C) (01/31 1515) Temp Source: Oral (01/31 1312) BP: 119/94 (01/31 1700) Pulse Rate: 99 (01/31 1700)  Labs: Recent Labs    03/04/17 1012 03/04/17 1318 03/04/17 1639 03/04/17 2046  HGB 12.9*  --  12.9*  --   HCT 39.6*  --  39.8*  --   PLT 256  --  257  --   APTT 34  --   --   --   LABPROT 14.5  --  15.2  --   INR 1.14  --  1.21  --   HEPARINUNFRC  --   --   --  <0.10*  CREATININE 0.71 0.79 0.66  --   TROPONINI 0.06* 0.04*  --   --     Estimated Creatinine Clearance: 110.4 mL/min (by C-G formula based on SCr of 0.66 mg/dL).   Medical History: Past Medical History:  Diagnosis Date  . Chronic cough   . COPD (chronic obstructive pulmonary disease) ([place])   . Elevated liver function tests   . Fibrosis, pulmonary, interstitial, diffuse ([place])   . GERD (gastroesophageal reflux disease)   . History of cocaine abuse   . Mediastinal lymphadenopathy   . Pulmonary fibrosis ([place])   . Right inguinal hernia     Medications:  Infusions:  . sodium chloride    . sodium chloride    . [START ON 03/05/2017] sodium chloride     Followed by  . [START ON 03/05/2017] sodium chloride    . amiodarone 30 mg/hr (03/04/17 2112)  . heparin 1,300 Units/hr (03/04/17 1424)    Assessment: 60 yom sent by PCP d/t ECG changes (tachycardic). PMH COPD, pulmonary fibrosis, gastric reflux, HTN, HLD. Pharmacy consulted to dose heparin for AF. No PTA OAC listed.  Goal of Therapy:  Heparin level 0.3-0.7 units/ml Monitor platelets by anticoagulation protocol: Yes   Plan:  Give 4600 units bolus x 1 Start heparin infusion at 1300 units/hr Check anti-Xa level in 6 hours and daily while on heparin Continue  to monitor H&H and platelets   01/31 @ 2100 HL < 0.10 subtherapeutic. Will reblus w/ heparin 3000 units IV x 1 and increase rate to 1500 units/hr and will recheck HL @ 0500.  Tobie Lords, Pharm.D., BCPS Clinical Pharmacist 03/04/2017,11:19 PM

## 2017-03-05 ENCOUNTER — Encounter
Admission: EM | Disposition: A | Discharge status: Home / Self Care | Payer: Self-pay | Source: Home / Self Care | Attending: Internal Medicine

## 2017-03-05 HISTORY — PX: RIGHT/LEFT HEART CATH AND CORONARY ANGIOGRAPHY: CATH118266

## 2017-03-05 LAB — BASIC METABOLIC PANEL
Anion gap: 12 (ref 5–15)
BUN: 12 mg/dL (ref 6–20)
CALCIUM: 8.7 mg/dL — AB (ref 8.9–10.3)
CO2: 29 mmol/L (ref 22–32)
Chloride: 101 mmol/L (ref 101–111)
Creatinine, Ser: 0.77 mg/dL (ref 0.61–1.24)
GFR calc Af Amer: >60 mL/min (ref >60)
GLUCOSE: 112 mg/dL — AB (ref 65–99)
Potassium: 3.1 mmol/L — ABNORMAL LOW (ref 3.5–5.1)
SODIUM: 142 mmol/L (ref 135–145)

## 2017-03-05 LAB — CBC
HCT: 41.9 % (ref 40.0–52.0)
Hemoglobin: 13.9 g/dL (ref 13.0–18.0)
MCH: 28.3 pg (ref 26.0–34.0)
MCHC: 33.2 g/dL (ref 32.0–36.0)
MCV: 85.3 fL (ref 80.0–100.0)
Platelets: 249 10*3/uL (ref 150–440)
RBC: 4.91 MIL/uL (ref 4.40–5.90)
RDW: 14.8 % — AB (ref 11.5–14.5)
WBC: 5 10*3/uL (ref 3.8–10.6)

## 2017-03-05 LAB — HEPARIN LEVEL (UNFRACTIONATED): Heparin Unfractionated: 0.19 IU/mL — ABNORMAL LOW (ref 0.30–0.70)

## 2017-03-05 LAB — HIV ANTIBODY (ROUTINE TESTING W REFLEX): HIV Screen 4th Generation wRfx: NONREACTIVE

## 2017-03-05 SURGERY — RIGHT/LEFT HEART CATH AND CORONARY ANGIOGRAPHY
Anesthesia: Moderate Sedation | Laterality: Right

## 2017-03-05 MED ORDER — ONDANSETRON HCL 4 MG/2ML IJ SOLN: 4.0000 mg | Freq: Four times a day (QID) | INTRAMUSCULAR | Status: DC | PRN

## 2017-03-05 MED ORDER — IOPAMIDOL (ISOVUE-300) INJECTION 61%
INTRAVENOUS | Status: DC | PRN
  Administered 2017-03-05: 110 mL via INTRA_ARTERIAL

## 2017-03-05 MED ORDER — CARVEDILOL 12.5 MG PO TABS
12.5000 mg | ORAL_TABLET | Freq: Two times a day (BID) | ORAL | Status: DC
  Administered 2017-03-05 – 2017-03-06 (×2): 12.5 mg via ORAL
  Filled 2017-03-05 (×2): qty 1

## 2017-03-05 MED ORDER — SODIUM CHLORIDE 0.9 % WEIGHT BASED INFUSION
1.0000 mL/kg/h | INTRAVENOUS | Status: AC
  Administered 2017-03-05: 1 mL/kg/h via INTRAVENOUS

## 2017-03-05 MED ORDER — MIDAZOLAM HCL 2 MG/2ML IJ SOLN
INTRAMUSCULAR | Status: AC
Start: 2017-03-05 — End: 2017-03-05
  Filled 2017-03-05: qty 2

## 2017-03-05 MED ORDER — KETOROLAC TROMETHAMINE 60 MG/2ML IM SOLN
INTRAMUSCULAR | Status: AC
  Filled 2017-03-05: qty 2

## 2017-03-05 MED ORDER — SODIUM CHLORIDE 0.9% FLUSH: 3.0000 mL | INTRAVENOUS | Status: DC | PRN

## 2017-03-05 MED ORDER — POTASSIUM CHLORIDE CRYS ER 20 MEQ PO TBCR
40.0000 meq | EXTENDED_RELEASE_TABLET | ORAL | Status: AC
  Administered 2017-03-05: 40 meq via ORAL
  Filled 2017-03-05: qty 2

## 2017-03-05 MED ORDER — HEPARIN (PORCINE) IN NACL 2-0.9 UNIT/ML-% IJ SOLN
INTRAMUSCULAR | Status: AC
  Filled 2017-03-05: qty 1000

## 2017-03-05 MED ORDER — AMIODARONE HCL 200 MG PO TABS
400.0000 mg | ORAL_TABLET | Freq: Two times a day (BID) | ORAL | Status: DC
  Administered 2017-03-05 (×2): 400 mg via ORAL
  Filled 2017-03-05 (×2): qty 2

## 2017-03-05 MED ORDER — DIGOXIN 125 MCG PO TABS
0.0625 mg | ORAL_TABLET | Freq: Every day | ORAL | Status: DC
  Administered 2017-03-05: 0.0625 mg via ORAL
  Filled 2017-03-05 (×2): qty 0.5

## 2017-03-05 MED ORDER — SODIUM CHLORIDE 0.9 % IV SOLN: 250.0000 mL | INTRAVENOUS | Status: DC | PRN

## 2017-03-05 MED ORDER — HEPARIN BOLUS VIA INFUSION
2800.0000 [IU] | Freq: Once | INTRAVENOUS | Status: AC
  Administered 2017-03-05: 2800 [IU] via INTRAVENOUS
  Filled 2017-03-05: qty 2800

## 2017-03-05 MED ORDER — FENTANYL CITRATE (PF) 100 MCG/2ML IJ SOLN
INTRAMUSCULAR | Status: AC
  Filled 2017-03-05: qty 2

## 2017-03-05 MED ORDER — SODIUM CHLORIDE 0.9% FLUSH
3.0000 mL | Freq: Two times a day (BID) | INTRAVENOUS | Status: DC
  Administered 2017-03-05 – 2017-03-06 (×2): 3 mL via INTRAVENOUS

## 2017-03-05 MED ORDER — MIDAZOLAM HCL 2 MG/2ML IJ SOLN
INTRAMUSCULAR | Status: DC | PRN
  Administered 2017-03-05: 0.5 mg via INTRAVENOUS

## 2017-03-05 MED ORDER — ACETAMINOPHEN 325 MG PO TABS: 650.0000 mg | ORAL_TABLET | ORAL | Status: DC | PRN

## 2017-03-05 MED ORDER — FENTANYL CITRATE (PF) 100 MCG/2ML IJ SOLN
INTRAMUSCULAR | Status: DC | PRN
  Administered 2017-03-05: 25 ug via INTRAVENOUS

## 2017-03-05 SURGICAL SUPPLY — 14 items
CATH 5FR JR4 DIAGNOSTIC (CATHETERS) ×3 IMPLANT
CATH INFINITI 5FR ANG PIGTAIL (CATHETERS) ×3 IMPLANT
CATH INFINITI 5FR JL4 (CATHETERS) ×3 IMPLANT
CATH SWANZ 7F THERMO (CATHETERS) ×3 IMPLANT
DEVICE CLOSURE MYNXGRIP 5F (Vascular Products) ×3 IMPLANT
DEVICE SAFEGUARD 24CM (GAUZE/BANDAGES/DRESSINGS) ×3 IMPLANT
GUIDEWIRE EMER 3M J .025X150CM (WIRE) ×3 IMPLANT
KIT MANI 3VAL PERCEP (MISCELLANEOUS) ×3 IMPLANT
KIT RIGHT HEART (MISCELLANEOUS) ×3 IMPLANT
NEEDLE PERC 18GX7CM (NEEDLE) ×3 IMPLANT
PACK CARDIAC CATH (CUSTOM PROCEDURE TRAY) ×3 IMPLANT
SHEATH PINNACLE 5F 10CM (SHEATH) ×3 IMPLANT
SHEATH PINNACLE 7F 10CM (SHEATH) ×3 IMPLANT
WIRE GUIDERIGHT .035X150 (WIRE) ×3 IMPLANT

## 2017-03-05 NOTE — Care Management (Signed)
Patient sent to ED from PCP office for tachycardia.  Patient with atrial fib with RVR and heart failure.  Cardiac cath shows an EF of 20%. Independent in all adls, denies issues accessing medical care, obtaining medications or with transportation.  Current with PCP.  Patient says he has access to scales.  Currently is on room air

## 2017-03-05 NOTE — Progress Notes (Signed)
Deflated 20 cc at PAD dressing, site clean, soft and non tender. RN will continue to monitor.

## 2017-03-05 NOTE — Progress Notes (Signed)
Cardia cath revealed normal coronaries and severe LV dysfunction with LVEF 20%, and severe pulmonary HTN. Advise coreg , amiodrone tapering dosage, 400 po bid for 5 days and the 400 po daily and the 200 mg daily. May go home with f/u Tuesday next week at [facility].

## 2017-03-05 NOTE — Progress Notes (Signed)
SUBJECTIVE: No chest pain, no shortness of breath. LE edema is improving.    Vitals:   03/05/17 0358 03/05/17 0400 03/05/17 0736 03/05/17 0800  BP:  105/84  120/80  Pulse: 77 74 85 81  Resp:      Temp:   97.7 F (36.5 C)   TempSrc:   Oral   SpO2: 98%  99% 98%  Weight: 207 lb 14.4 oz (94.3 kg)     Height:        Intake/Output Summary (Last 24 hours) at 03/05/2017 0920 Last data filed at 03/05/2017 0300 Gross per 24 hour  Intake 427.63 ml  Output 2950 ml  Net -2522.37 ml    LABS: Basic Metabolic Panel: Recent Labs    03/04/17 1318 03/04/17 1639 03/05/17 0445  NA  --  140 142  K  --  2.9* 3.1*  CL  --  99* 101  CO2  --  31 29  GLUCOSE  --  124* 112*  BUN  --  11 12  CREATININE 0.79 0.66 0.77  CALCIUM  --  8.7* 8.7*  MG 2.0  --   --    Liver Function Tests: Recent Labs    03/04/17 1012  AST 24  ALT 11*  ALKPHOS 57  BILITOT 1.3*  PROT 7.8  ALBUMIN 3.4*   No results for input(s): LIPASE, AMYLASE in the last 72 hours. CBC: Recent Labs    03/04/17 1639 03/05/17 0445  WBC 5.9 5.0  HGB 12.9* 13.9  HCT 39.8* 41.9  MCV 85.9 85.3  PLT 257 249   Cardiac Enzymes: Recent Labs    03/04/17 1012 03/04/17 1318  TROPONINI 0.06* 0.04*   BNP: Invalid input(s): POCBNP D-Dimer: No results for input(s): DDIMER in the last 72 hours. Hemoglobin A1C: No results for input(s): HGBA1C in the last 72 hours. Fasting Lipid Panel: No results for input(s): CHOL, HDL, LDLCALC, TRIG, CHOLHDL, LDLDIRECT in the last 72 hours. Thyroid Function Tests: No results for input(s): TSH, T4TOTAL, T3FREE, THYROIDAB in the last 72 hours.  Invalid input(s): FREET3 Anemia Panel: No results for input(s): VITAMINB12, FOLATE, FERRITIN, TIBC, IRON, RETICCTPCT in the last 72 hours.   PHYSICAL EXAM General: Well developed, well nourished, in no acute distress HEENT:  Normocephalic and atramatic Neck:  No JVD.  Lungs: Clear bilaterally to auscultation and percussion. Heart: HRRR . Normal  S1 and S2 without gallops or murmurs.  Abdomen: Bowel sounds are positive, abdomen soft and non-tender  Msk:  Back normal, normal gait. Normal strength and tone for age. Extremities: No clubbing, cyanosis or edema.   Neuro: Alert and oriented X 3. Psych:  Good affect, responds appropriately  TELEMETRY: Atrial fibrillation 90bpm  ASSESSMENT AND PLAN: Atrial fibrillation with history of RVR, echo shows 4 chamber dilatation witth severe LV systolic dysfunction EF 86-76% and moderate MR/TR. Continue Entresto, lasix and aldactone. Heart rate is controlled currently on amiodarone drip. Pt is scheduled for cardiac cath this morning to rule out coronary artery disease.   Active Problems:   Acute CHF (congestive heart failure) ([place])    Jake Bathe, NP-C 03/05/2017 9:20 AM Cell: [phone]

## 2017-03-05 NOTE — Progress Notes (Signed)
[place] at [facility]                                                                                                                                                                                  Patient Demographics   Jason Fitzgerald, is a 67 y.o. male, DOB - 1950/05/10, ZOX:[id number]  Admit date - 03/04/2017   Admitting Physician Demetrios Loll, MD  Outpatient Primary MD for the patient is Roselee Nova, MD   LOS - 1  Subjective: Pt feeling better, sob improved, hr improved    Review of Systems:   CONSTITUTIONAL: No documented fever. No fatigue, weakness. No weight gain, no weight loss.  EYES: No blurry or double vision.  ENT: No tinnitus. No postnasal drip. No redness of the oropharynx.  RESPIRATORY: No cough, no wheeze, no hemoptysis. + dyspnea.  CARDIOVASCULAR: No chest pain. No orthopnea. No palpitations. No syncope.  GASTROINTESTINAL: No nausea, no vomiting or diarrhea. No abdominal pain. No melena or hematochezia.  GENITOURINARY: No dysuria or hematuria.  ENDOCRINE: No polyuria or nocturia. No heat or cold intolerance.  HEMATOLOGY: No anemia. No bruising. No bleeding.  INTEGUMENTARY: No rashes. No lesions.  MUSCULOSKELETAL: No arthritis. No swelling. No gout.  NEUROLOGIC: No numbness, tingling, or ataxia. No seizure-type activity.  PSYCHIATRIC: No anxiety. No insomnia. No ADD.    Vitals:   Vitals:   03/05/17 1230 03/05/17 1245 03/05/17 1337 03/05/17 1641  BP: (!) 120/93 118/81 125/87 111/72  Pulse: 92 87 86 82  Resp: 18 (!) 29 18 18   Temp:   98.3 F (36.8 C) 98.3 F (36.8 C)  TempSrc:   Oral Oral  SpO2: 96% 96% 99% 97%  Weight:      Height:        Wt Readings from Last 3 Encounters:  03/05/17 207 lb 14.4 oz (94.3 kg)  03/04/17 218 lb 14.4 oz (99.3 kg)  10/15/16 211 lb 8 oz (95.9 kg)     Intake/Output Summary (Last 24 hours) at 03/05/2017 1705 Last data filed at 03/05/2017 1639 Gross per 24 hour  Intake 963.34 ml  Output  800 ml  Net 163.34 ml    Physical Exam:   GENERAL: Pleasant-appearing in no apparent distress.  HEAD, EYES, EARS, NOSE AND THROAT: Atraumatic, normocephalic. Extraocular muscles are intact. Pupils equal and reactive to light. Sclerae anicteric. No conjunctival injection. No oro-pharyngeal erythema.  NECK: Supple. There is no jugular venous distention. No bruits, no lymphadenopathy, no thyromegaly.  HEART: Irregularly irregular,. No murmurs, no rubs, no clicks.  LUNGS: Crackles at the bases no rales or rhonchi. No wheezes.  ABDOMEN: Soft, flat, nontender, nondistended. Has good bowel sounds.  No hepatosplenomegaly appreciated.  EXTREMITIES: No evidence of any cyanosis, clubbing, or 2+ peripheral edema.  +2 pedal and radial pulses bilaterally.  NEUROLOGIC: The patient is alert, awake, and oriented x3 with no focal motor or sensory deficits appreciated bilaterally.  SKIN: Moist and warm with no rashes appreciated.  Psych: Not anxious, depressed LN: No inguinal LN enlargement    Antibiotics   Anti-infectives (From admission, onward)   Start     Dose/Rate Route Frequency Ordered Stop   03/04/17 1430  hydroxychloroquine (PLAQUENIL) tablet 400 mg     400 mg Oral Daily 03/04/17 1231        Medications   Scheduled Meds: . amiodarone  400 mg Oral BID  . carvedilol  12.5 mg Oral BID WC  . digoxin  0.0625 mg Oral Daily  . furosemide  40 mg Intravenous Q12H  . hydroxychloroquine  400 mg Oral Daily  . leflunomide  10 mg Oral Daily  . mometasone-formoterol  2 puff Inhalation BID  . pantoprazole  40 mg Oral 1 day or 1 dose  . predniSONE  5 mg Oral Daily  . sacubitril-valsartan  1 tablet Oral BID  . sodium chloride flush  3 mL Intravenous Q12H  . sodium chloride flush  3 mL Intravenous Q12H  . spironolactone  25 mg Oral Daily   Continuous Infusions: . sodium chloride    . sodium chloride    . sodium chloride 1 mL/kg/hr (03/05/17 1319)   PRN Meds:.sodium chloride, sodium chloride,  acetaminophen **OR** acetaminophen, acetaminophen, albuterol, bisacodyl, HYDROcodone-acetaminophen, ketorolac, ondansetron **OR** ondansetron (ZOFRAN) IV, ondansetron (ZOFRAN) IV, senna-docusate, sodium chloride flush, sodium chloride flush   Data Review:   Micro Results No results found for this or any previous visit (from the past 240 hour(s)).  Radiology Reports Dg Chest 2 View  Result Date: 03/04/2017 CLINICAL DATA:  Patient states that he saw his PCP this morning for his hands and was referred here for his chest. He has been SOB for 3 weeks. No other chest complaints. Pulmonary fibrosis. COPD. EXAM: CHEST  2 VIEW COMPARISON:  Previous chest x-ray 03/01/2013, chest CT 09/17/2015 FINDINGS: Heart size is normal. Chronic changes are identified throughout the lung bases at the periphery, with a similar pattern compared to the previous CT exam. Opacity within lung bases has increased slightly, raising the question of progressive fibrosis or superimposed edema on chronic changes. No frank consolidations are identified. IMPRESSION: 1. Chronic honeycomb changes. 2. Possible superimposed bilateral edema or progressive fibrosis. Electronically Signed   By: Nolon Nations M.D.   On: 03/04/2017 10:57     CBC Recent Labs  Lab 03/04/17 1012 03/04/17 1639 03/05/17 0445  WBC 6.0 5.9 5.0  HGB 12.9* 12.9* 13.9  HCT 39.6* 39.8* 41.9  PLT 256 257 249  MCV 86.9 85.9 85.3  MCH 28.3 27.8 28.3  MCHC 32.5 32.4 33.2  RDW 15.1* 14.8* 14.8*    Chemistries  Recent Labs  Lab 03/04/17 1012 03/04/17 1318 03/04/17 1639 03/05/17 0445  NA 141  --  140 142  K 2.8*  --  2.9* 3.1*  CL 102  --  99* 101  CO2 26  --  31 29  GLUCOSE 110*  --  124* 112*  BUN 15  --  11 12  CREATININE 0.71 0.79 0.66 0.77  CALCIUM 8.5*  --  8.7* 8.7*  MG  --  2.0  --   --   AST 24  --   --   --   ALT  11*  --   --   --   ALKPHOS 57  --   --   --   BILITOT 1.3*  --   --   --     ------------------------------------------------------------------------------------------------------------------ estimated creatinine clearance is 109.8 mL/min (by C-G formula based on SCr of 0.77 mg/dL). ------------------------------------------------------------------------------------------------------------------ No results for input(s): HGBA1C in the last 72 hours. ------------------------------------------------------------------------------------------------------------------ No results for input(s): CHOL, HDL, LDLCALC, TRIG, CHOLHDL, LDLDIRECT in the last 72 hours. ------------------------------------------------------------------------------------------------------------------ No results for input(s): TSH, T4TOTAL, T3FREE, THYROIDAB in the last 72 hours.  Invalid input(s): FREET3 ------------------------------------------------------------------------------------------------------------------ No results for input(s): VITAMINB12, FOLATE, FERRITIN, TIBC, IRON, RETICCTPCT in the last 72 hours.  Coagulation profile Recent Labs  Lab 03/04/17 1012 03/04/17 1639  INR 1.14 1.21    No results for input(s): DDIMER in the last 72 hours.  Cardiac Enzymes Recent Labs  Lab 03/04/17 1012 03/04/17 1318  TROPONINI 0.06* 0.04*   ------------------------------------------------------------------------------------------------------------------ Invalid input(s): POCBNP    Assessment & Plan  Patient is a 67 year old admitted with CHF and A. fib with RVR  #1 acute systolic CHF Continue therapy with IV Lasix Echocardiogram shows significant systolic dysfunction conitnue therapy with Coreg, Entresto  #2 new onset A. fib.  With RVR. Heart rate much improved  Continue therapy with amiodarone and Coreg Continue aspirin  #3 severe pulmonary hypertension noticed on cardiac cath likely due to underlying COPD Needs pulmonary evaluation   #4 hypokalemia.  replace potassium  #5  elevated troponin.  P due to demand ischemia status post cardiac cath   #6 COPD.  Stable, nebulizer as needed.        Code Status Orders  (From admission, onward)        Start     Ordered   03/04/17 1232  Full code  Continuous     03/04/17 1231    Code Status History    Date Active Date Inactive Code Status Order ID Comments User Context   10/26/2014 11:28 10/26/2014 15:00 Full Code [id number]  Hubbard Robinson, MD Outpatient           Consults cardiology  DVT Prophylaxis heparin  Lab Results  Component Value Date   PLT 249 03/05/2017     Time Spent in minutes   35 minutes  Greater than 50% of time spent in care coordination and counseling patient regarding the condition and plan of care.   Dustin Flock M.D on 03/05/2017 at 5:05 PM  Between 7am to 6pm - Pager - [phone]  After 6pm go to [email or web address] - password [email or web address] [facility]   Office  [phone]

## 2017-03-05 NOTE — Progress Notes (Signed)
ANTICOAGULATION CONSULT NOTE - Initial Consult  Pharmacy Consult for heparin Indication: atrial fibrillation  No Known Allergies  Patient Measurements: Height: 6' (182.9 cm) Weight: 207 lb 14.4 oz (94.3 kg) IBW/kg (Calculated) : 77.6 Heparin Dosing Weight: 91.5 kg  Vital Signs: Pulse Rate: 77 (02/01 0358)  Labs: Recent Labs    03/04/17 1012 03/04/17 1318 03/04/17 1639 03/04/17 2046 03/05/17 0445  HGB 12.9*  --  12.9*  --  13.9  HCT 39.6*  --  39.8*  --  41.9  PLT 256  --  257  --  249  APTT 34  --   --   --   --   LABPROT 14.5  --  15.2  --   --   INR 1.14  --  1.21  --   --   HEPARINUNFRC  --   --   --  <0.10* 0.19*  CREATININE 0.71 0.79 0.66  --  0.77  TROPONINI 0.06* 0.04*  --   --   --     Estimated Creatinine Clearance: 109.8 mL/min (by C-G formula based on SCr of 0.77 mg/dL).   Medical History: Past Medical History:  Diagnosis Date  . Chronic cough   . COPD (chronic obstructive pulmonary disease) ([place])   . Elevated liver function tests   . 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 GERD (gastroesophageal reflux disease)   . History of cocaine abuse   . Mediastinal lymphadenopathy   . Pulmonary fibrosis ([place])   . Right inguinal hernia     Medications:  Infusions:  . sodium chloride    . sodium chloride    . sodium chloride 3 mL/kg/hr (03/05/17 0630)   Followed by  . sodium chloride    . amiodarone 30 mg/hr (03/04/17 2112)  . heparin 1,500 Units/hr (03/05/17 0403)    Assessment: 15 yom sent by PCP d/t ECG changes (tachycardic). PMH COPD, pulmonary fibrosis, gastric reflux, HTN, HLD. Pharmacy consulted to dose heparin for AF. No PTA OAC listed.  Goal of Therapy:  Heparin level 0.3-0.7 units/ml Monitor platelets by anticoagulation protocol: Yes   Plan:  Give 4600 units bolus x 1 Start heparin infusion at 1300 units/hr Check anti-Xa level in 6 hours and daily while on heparin Continue to monitor H&H and platelets   01/31 @ 2100 HL  < 0.10 subtherapeutic. Will reblus w/ heparin 3000 units IV x 1 and increase rate to 1500 units/hr and will recheck HL @ 0500.  02/01 @ 0500 HL 0.19 subtherapeutic. Will rebolus w/ heparin 2800 units IV x 1 and increase rate to 1650 units/hr and will recheck HL @ 1100, CBC stable.  Tobie Lords, Pharm.D., BCPS Clinical Pharmacist 03/05/2017,6:41 AM

## 2017-03-05 NOTE — Progress Notes (Signed)
Deflate 20 cc of air at PAD dressing. No bleeding noted, soft and non-tender will keep PAD dressing on. RN will continue to monitor.

## 2017-03-06 LAB — BASIC METABOLIC PANEL
Anion gap: 11 (ref 5–15)
BUN: 13 mg/dL (ref 6–20)
CHLORIDE: 101 mmol/L (ref 101–111)
CO2: 29 mmol/L (ref 22–32)
CREATININE: 0.81 mg/dL (ref 0.61–1.24)
Calcium: 8.1 mg/dL — ABNORMAL LOW (ref 8.9–10.3)
GFR calc non Af Amer: >60 mL/min (ref >60)
Glucose, Bld: 115 mg/dL — ABNORMAL HIGH (ref 65–99)
POTASSIUM: 3.3 mmol/L — AB (ref 3.5–5.1)
SODIUM: 141 mmol/L (ref 135–145)

## 2017-03-06 LAB — LIPID PANEL
CHOLESTEROL: 125 mg/dL (ref 0–200)
HDL: 41 mg/dL (ref >40)
LDL Cholesterol: 72 mg/dL (ref 0–99)
Total CHOL/HDL Ratio: 3 RATIO
Triglycerides: 60 mg/dL (ref <150)
VLDL: 12 mg/dL (ref 0–40)

## 2017-03-06 MED ORDER — FUROSEMIDE 20 MG PO TABS: 20.0000 mg | ORAL_TABLET | Freq: Every day | ORAL | 0 refills | Status: DC

## 2017-03-06 MED ORDER — SACUBITRIL-VALSARTAN 24-26 MG PO TABS: 1.0000 | ORAL_TABLET | Freq: Two times a day (BID) | ORAL | 0 refills | Status: DC

## 2017-03-06 MED ORDER — CARVEDILOL 3.125 MG PO TABS: 3.1250 mg | ORAL_TABLET | Freq: Two times a day (BID) | ORAL | Status: DC

## 2017-03-06 MED ORDER — DIGOXIN 125 MCG PO TABS
0.1250 mg | ORAL_TABLET | Freq: Every day | ORAL | Status: DC
  Filled 2017-03-06: qty 1

## 2017-03-06 MED ORDER — DIGOXIN 125 MCG PO TABS: 0.1250 mg | ORAL_TABLET | Freq: Every day | ORAL | 0 refills | Status: DC

## 2017-03-06 MED ORDER — AMIODARONE HCL 400 MG PO TABS: 400.0000 mg | ORAL_TABLET | Freq: Two times a day (BID) | ORAL | 0 refills | Status: DC

## 2017-03-06 MED ORDER — SPIRONOLACTONE 25 MG PO TABS: 12.5000 mg | ORAL_TABLET | Freq: Every day | ORAL | 0 refills | Status: DC

## 2017-03-06 NOTE — Progress Notes (Signed)
Treated for new onset atrial fibrillation with RVR, acute systolic heart failure.  Seen by cardiology, patient is on amiodarone 400 mg twice daily for 1 week followed by 400mg  daily, stable for discharge.  Instructions on the computer, discussed with the patient about use of amiodarone, digoxin.  And also discussed with Dr. Patrice Paradise also. 2.  Hypotension;Decrease the dose of Coreg and also Aldactone, decrease the dose of Lasix also 20 mg.  Also advised the patient to have a BP machine at home.  Patient has appointment with cardiologist Dr. Neoma Laming on Tuesday at 10 AM.  That is February 5 3.  History of severe pulmonary hypertension: Patient can have outpatient pulmonary follow-up Dr. Neoma Laming can arrange that. Time spent on this patient 30 minutes

## 2017-03-06 NOTE — Progress Notes (Signed)
SUBJECTIVE: Patient is feeling much better denies any chest pain or shortness of breath.   Vitals:   03/05/17 1641 03/05/17 1927 03/06/17 0508 03/06/17 0828  BP: 111/72 (!) 92/54 116/78 117/66  Pulse: 82 61 77 75  Resp: 18 18 16 18   Temp: 98.3 F (36.8 C) 98.1 F (36.7 C) 98.9 F (37.2 C) 98 F (36.7 C)  TempSrc: Oral Oral Oral Oral  SpO2: 97% 93% 97% 97%  Weight:   211 lb 9.6 oz (96 kg)   Height:        Intake/Output Summary (Last 24 hours) at 03/06/2017 0917 Last data filed at 03/06/2017 0820 Gross per 24 hour  Intake 963.34 ml  Output 300 ml  Net 663.34 ml    LABS: Basic Metabolic Panel: Recent Labs    03/04/17 1318  03/05/17 0445 03/06/17 0630  NA  --    < > 142 141  K  --    < > 3.1* 3.3*  CL  --    < > 101 101  CO2  --    < > 29 29  GLUCOSE  --    < > 112* 115*  BUN  --    < > 12 13  CREATININE 0.79   < > 0.77 0.81  CALCIUM  --    < > 8.7* 8.1*  MG 2.0  --   --   --    < > = values in this interval not displayed.   Liver Function Tests: Recent Labs    03/04/17 1012  AST 24  ALT 11*  ALKPHOS 57  BILITOT 1.3*  PROT 7.8  ALBUMIN 3.4*   No results for input(s): LIPASE, AMYLASE in the last 72 hours. CBC: Recent Labs    03/04/17 1639 03/05/17 0445  WBC 5.9 5.0  HGB 12.9* 13.9  HCT 39.8* 41.9  MCV 85.9 85.3  PLT 257 249   Cardiac Enzymes: Recent Labs    03/04/17 1012 03/04/17 1318  TROPONINI 0.06* 0.04*   BNP: Invalid input(s): POCBNP D-Dimer: No results for input(s): DDIMER in the last 72 hours. Hemoglobin A1C: No results for input(s): HGBA1C in the last 72 hours. Fasting Lipid Panel: Recent Labs    03/06/17 0630  CHOL 125  HDL 41  LDLCALC 72  TRIG 60  CHOLHDL 3.0   Thyroid Function Tests: No results for input(s): TSH, T4TOTAL, T3FREE, THYROIDAB in the last 72 hours.  Invalid input(s): FREET3 Anemia Panel: No results for input(s): VITAMINB12, FOLATE, FERRITIN, TIBC, IRON, RETICCTPCT in the last 72 hours.   PHYSICAL  EXAM General: Well developed, well nourished, in no acute distress HEENT:  Normocephalic and atramatic Neck:  No JVD.  Lungs: Clear bilaterally to auscultation and percussion. Heart: HRRR . Normal S1 and S2 without gallops or murmurs.  Abdomen: Bowel sounds are positive, abdomen soft and non-tender  Msk:  Back normal, normal gait. Normal strength and tone for age. Extremities: No clubbing, cyanosis or edema.   Neuro: Alert and oriented X 3. Psych:  Good affect, responds appropriately  TELEMETRY: Sinus rhythm 84 bpm  ASSESSMENT AND PLAN: Congestive heart failure with severe left ventricular systolic dysfunction with normal coronaries on cardiac catheter yesterday. Left ventricular ejection fraction was 20% with severely dilated left ventricle and severe pulmonary hypertension. Advise continuing current medications including digoxin Coreg entersto and Lasix 40 mg or 20 mg once a day along with Aldactone and digoxin. Patient will be seen in the office next Tuesday at 10:00. Patient can be  discharged cardiac point of view. Amiodarone can be sent home on 400 by mouth twice a day for one week and then 400 mg daily. [facility] cardiology will be covering in case patient stays overnight but will sign off the patient.  Active Problems:   Acute CHF (congestive heart failure) ([place])    Jason Fitzgerald A, MD, [facility] 03/06/2017 9:17 AM

## 2017-03-06 NOTE — Progress Notes (Signed)
Went over discharge instructions with the patient including medications and follow-up appointment. Discontinue peripheral IV and telemetry monitor. Provide coupon for entresto. NT to help patient for transport.

## 2017-03-06 NOTE — Care Management Note (Signed)
Case Management Note  Patient Details  Name: IZZAK FRIES MRN: [id number] Date of Birth: Dec 08, 1950  Subjective/Objective:     Provided an Entresto Coupon from on line.                   Action/Plan:   Expected Discharge Date:  03/06/17               Expected Discharge Plan:     In-House Referral:     Discharge planning Services     Post Acute Care Choice:    Choice offered to:     DME Arranged:    DME Agency:     HH Arranged:    HH Agency:     Status of Service:     If discussed at [employer], dates discussed:    Additional Comments:  Lindell Renfrew A, RN 03/06/2017, 1:32 PM

## 2017-03-06 NOTE — Progress Notes (Signed)
Patient stable for discharge.   PIV discontinued, catheter intact; site clean, dry, intact. PAD on R groin replaced with gauze; clean, dry, intact.  Discharge instructions and follow-up reviewed. Patient verbalized understanding.   Family at bedside to take patient home.

## 2017-03-09 ENCOUNTER — Telehealth: Payer: Self-pay

## 2017-03-09 NOTE — Telephone Encounter (Signed)
TOC #1. Called to f/u after d/c from [facility] on 03/06/17. Also wanted to confirm hosp f/u appt w/ cardiology as well as with [facility]. Discharge planning did not include f/u w/ PCP but given Dr. Trena Platt response below, I called to f/u and to schedule an appt w/ Dr. Manuella Ghazi PRIOR to 03/19/17. LVM requesting returned call.   ----- Message -----  From: Aleatha Borer, LPN  Sent: 02/07/1094  3:50 PM  To: Roselee Nova, MD  Subject: [place] f/u                     You saw this pt on 1/31, had changes in his EKG and was sent to ED. Pt underwent cardiac cath on 2/1. Will f/u with cardio on 2/5 and [facility] on 2/8. Is there any reason you would want to see this pt in the office for hosp f/u? No current f/u appts scheduled with you or whomever will assume his care in your absence. Discharge planning did not include f/u with PCP. Please advise.   Response from Dr. Manuella Ghazi:  He should schedule a follow-up appointment with PCP and cardiology. I am here until February 15 and be happy to see him if he is able to schedule in that timeframe. Otherwise TCM calls can be made by both offices. Thank you

## 2017-03-12 ENCOUNTER — Ambulatory Visit: Payer: No Typology Code available for payment source | Attending: Family | Admitting: Family

## 2017-03-12 ENCOUNTER — Encounter: Payer: Self-pay | Admitting: Family

## 2017-03-12 VITALS — BP 149/75 | HR 72 | Resp 18 | Ht 69.0 in | Wt 208.1 lb

## 2017-03-12 DIAGNOSIS — Z7901 Long term (current) use of anticoagulants: Secondary | ICD-10-CM | POA: Insufficient documentation

## 2017-03-12 DIAGNOSIS — I110 Hypertensive heart disease with heart failure: Secondary | ICD-10-CM | POA: Diagnosis present

## 2017-03-12 DIAGNOSIS — J449 Chronic obstructive pulmonary disease, unspecified: Secondary | ICD-10-CM | POA: Diagnosis not present

## 2017-03-12 DIAGNOSIS — R42 Dizziness and giddiness: Secondary | ICD-10-CM | POA: Diagnosis not present

## 2017-03-12 DIAGNOSIS — Z9889 Other specified postprocedural states: Secondary | ICD-10-CM | POA: Insufficient documentation

## 2017-03-12 DIAGNOSIS — K219 Gastro-esophageal reflux disease without esophagitis: Secondary | ICD-10-CM | POA: Insufficient documentation

## 2017-03-12 DIAGNOSIS — I272 Pulmonary hypertension, unspecified: Secondary | ICD-10-CM | POA: Diagnosis not present

## 2017-03-12 DIAGNOSIS — I4891 Unspecified atrial fibrillation: Secondary | ICD-10-CM | POA: Insufficient documentation

## 2017-03-12 DIAGNOSIS — J8410 Pulmonary fibrosis, unspecified: Secondary | ICD-10-CM | POA: Insufficient documentation

## 2017-03-12 DIAGNOSIS — R0602 Shortness of breath: Secondary | ICD-10-CM | POA: Insufficient documentation

## 2017-03-12 DIAGNOSIS — I480 Paroxysmal atrial fibrillation: Secondary | ICD-10-CM

## 2017-03-12 DIAGNOSIS — I10 Essential (primary) hypertension: Secondary | ICD-10-CM

## 2017-03-12 DIAGNOSIS — Z79899 Other long term (current) drug therapy: Secondary | ICD-10-CM | POA: Diagnosis not present

## 2017-03-12 DIAGNOSIS — I5022 Chronic systolic (congestive) heart failure: Secondary | ICD-10-CM | POA: Diagnosis not present

## 2017-03-12 DIAGNOSIS — Z7952 Long term (current) use of systemic steroids: Secondary | ICD-10-CM | POA: Diagnosis not present

## 2017-03-12 NOTE — Patient Instructions (Addendum)
Begin weighing daily and call for an overnight weight gain of > 2 pounds or a weekly weight gain of >5 pounds. 

## 2017-03-12 NOTE — Progress Notes (Signed)
Patient ID: Jason Fitzgerald, male    DOB: 1950-09-08, 67 y.o.   MRN: [id number]  HPI  Jason Fitzgerald is a 67 y/o male with a history of COPD, pulmonary fibrosis, GERD, previous cocaine use and chronic heart failure.   Echo report from 03/04/17 showed an EF of 25-30% along with trivial AR, mild Jason, moderate TR and elevated PA pressure of 46 mm Hg. Cardiac catheterization done 03/05/17 which showed normal coronaries with severe LV dysfunction and severe pulmonary HTN.  Admitted 03/04/17 due to atrial fibrillation. Cardiology consult obtained. Medications were adjusted and he was discharged after 2 days.   He presents today for his initial visit with a chief complaint of moderate shortness of breath upon minimal exertion. He says that this has been present for several months with varying levels of severity. He has associated fatigue, rhinorrhea, light-headedness and chronic difficulty sleeping (works 3rd shift). He denies any chest pain, cough, edema, palpitations, abdominal distention or weight gain. Does not have scales.   Past Medical History:  Diagnosis Date  . CHF (congestive heart failure) ([place])   . Chronic cough   . COPD (chronic obstructive pulmonary disease) ([place])   . Elevated liver function tests   . Fibrosis, pulmonary, interstitial, diffuse ([place])   . GERD (gastroesophageal reflux disease)   . History of cocaine abuse   . Mediastinal lymphadenopathy   . Pulmonary fibrosis ([place])   . Right inguinal hernia    Past Surgical History:  Procedure Laterality Date  . HERNIA REPAIR Right 1973   open  . INGUINAL HERNIA REPAIR Right 11/08/2014   Procedure: LAPAROSCOPIC RIGHT INGUINAL HERNIA REPAIR;  Surgeon: Hubbard Robinson, MD;  Location: [facility] ORS;  Service: General;  Laterality: Right;  . knee arthroscopy Right 1972  . RIGHT/LEFT HEART CATH AND CORONARY ANGIOGRAPHY Right 03/05/2017   Procedure: RIGHT/LEFT HEART CATH AND CORONARY ANGIOGRAPHY;  Surgeon: Dionisio David, MD;  Location: [place] [facility];  Service: Cardiovascular;  Laterality: Right;   Family History  Problem Relation Age of Onset  . Diabetes Mother   . Cancer Father   . AAA (abdominal aortic aneurysm) Brother    Social History   Tobacco Use  . Smoking status: Never Smoker  . Smokeless tobacco: Never Used  Substance Use Topics  . Alcohol use: Yes    Alcohol/week: 0.0 oz    Comment: 2 quarts a week   No Known Allergies Prior to Admission medications   Medication Sig Start Date End Date Taking? Authorizing Provider  amiodarone (PACERONE) 400 MG tablet Take 1 tablet (400 mg total) by mouth 2 (two) times daily. Amiodarone 400 mg p.o. twice daily for 1 week followed by 400 mg p.o. daily for 1 week, follow with Dr. Neoma Laming for further directions on amiodarone dosing. Patient taking differently: Take 200 mg by mouth daily. Amiodarone 400 mg p.o. twice daily for 1 week followed by 400 mg p.o. daily for 1 week, follow with Dr. Neoma Laming for further directions on amiodarone dosing. 03/06/17  Yes Epifanio Lesches, MD  budesonide-formoterol (SYMBICORT) 160-4.5 MCG/ACT inhaler Inhale 2 puffs into the lungs as needed.   Yes [provider]  digoxin (LANOXIN) 0.125 MG tablet Take 1 tablet (0.125 mg total) by mouth daily. 03/06/17  Yes Epifanio Lesches, MD  furosemide (LASIX) 20 MG tablet Take 1 tablet (20 mg total) by mouth daily. 03/06/17 03/06/18 Yes Epifanio Lesches, MD  hydroxychloroquine (PLAQUENIL) 200 MG tablet Take 2 tablets by mouth daily. 01/23/17  Yes  [provider]  leflunomide (ARAVA) 10 MG tablet Take 1 tablet by mouth daily. 12/03/16  Yes [provider]  predniSONE (DELTASONE) 5 MG tablet Take 1 tablet by mouth daily. 01/22/17  Yes [provider]  rivaroxaban (XARELTO) 20 MG TABS tablet Take 20 mg by mouth daily with supper.   Yes [provider]  spironolactone (ALDACTONE) 25 MG tablet Take 0.5 tablets (12.5 mg total) by mouth daily. 03/06/17  03/06/18 Yes Epifanio Lesches, MD  albuterol (PROVENTIL HFA) 108 (90 Base) MCG/ACT inhaler Inhale 2 puffs into the lungs as needed. 03/24/16 03/24/17  [provider]  budesonide-formoterol (SYMBICORT) 80-4.5 MCG/ACT inhaler Take 2 puffs by mouth as needed. As needed. 01/01/14   [provider]  pantoprazole (PROTONIX) 40 MG tablet Take 1 tablet by mouth 1 day or 1 dose. In am 09/19/14   [provider]  sacubitril-valsartan (ENTRESTO) 24-26 MG Take 1 tablet by mouth 2 (two) times daily. 03/06/17   Epifanio Lesches, MD    Review of Systems  Constitutional: Positive for fatigue. Negative for appetite change.  HENT: Positive for rhinorrhea. Negative for congestion and sore throat.   Eyes: Negative.   Respiratory: Positive for shortness of breath. Negative for cough and chest tightness.   Cardiovascular: Negative for chest pain, palpitations and leg swelling.  Gastrointestinal: Negative for abdominal distention and abdominal pain.  Endocrine: Negative.   Genitourinary: Negative.   Musculoskeletal: Negative for back pain and neck pain.  Skin: Negative.   Allergic/Immunologic: Negative.   Neurological: Positive for light-headedness (with sudden position changes). Negative for dizziness.  Hematological: Negative for adenopathy. Does not bruise/bleed easily.  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 Negative for dysphoric mood. The patient is not nervous/anxious.     Vitals:   03/12/17 0933  BP: (!) 149/75  Pulse: 72  Resp: 18  SpO2: 98%  Weight: 208 lb 2 oz (94.4 kg)  Height: 5\' 9"  (1.753 m)   Wt Readings from Last 3 Encounters:  03/12/17 208 lb 2 oz (94.4 kg)  03/06/17 211 lb 9.6 oz (96 kg)  03/04/17 218 lb 14.4 oz (99.3 kg)   Lab Results  Component Value Date   CREATININE 0.81 03/06/2017   CREATININE 0.77 03/05/2017   CREATININE 0.66 03/04/2017    Physical Exam  Constitutional: He is oriented to person, place, and  time. He appears well-developed and well-nourished.  HENT:  Head: Normocephalic and atraumatic.  Neck: Normal range of motion. Neck supple. No JVD present.  Cardiovascular: Normal rate. An irregularly irregular rhythm present.  Pulmonary/Chest: Effort normal. He has no wheezes. He has no rales.  Abdominal: Soft. He exhibits no distension. There is no tenderness.  Musculoskeletal: He exhibits edema (trace edema). He exhibits no tenderness.  Right groin knot present  Neurological: He is alert and oriented to person, place, and time.  Skin: Skin is warm and dry.  Psychiatric: He has a normal mood and affect. His behavior is normal. Thought content normal.  Nursing note and vitals reviewed.  Assessment & Plan:  1: Chronic heart failure with reduced ejection fraction- - NYHA class III - euvolemic today - not weighing daily as he doesn't have scales. Set of scales was given to him today and he was instructed to weigh every morning and call for an overnight weight gain of >2 pounds or a weekly weight gain of >5 pounds - not adding salt to his food and has been trying to look at food labels for sodium content. Discussed  the importance of closely following a 2000mg  sodium diet and written dietary information was given to him about this - patient reports receiving his flu vaccine for the season - PharmD reconciled medications with the patient - 30 day voucher given to him for his entresto along with a $10 copay card. Patient's pharmacy says that a PA has been sent to his cardiologist - right groin knot present which is nontender to touch; advised to put heat on it and follow-up with his cardiologist if pain develops or if the knot continues (had recent cardiac catheterization) - does work 3rd shift so reports chronic difficulty sleeping  2: HTN- - BP looks good today - BMP from 03/06/17 was reviewed and showed sodium 141, potassium 3.3 and GFR >60  3: Atrial fibrillation- - saw cardiology Humphrey Rolls)  03/09/17 - pharmacy says that a PA was sent for his xarelto  4: Pulmonary HTN- - currently not using his inhalers - may need pulmonology follow-up  Medication bottles and discharge list were reviewed with the patient.  Return here in 1 month or sooner for any questions/problems before then.

## 2017-03-13 NOTE — Discharge Summary (Signed)
Jason Fitzgerald, is a 67 y.o. male  DOB 01-Sep-1950  MRN [id number].  Admission date:  03/04/2017  Admitting Physician  Demetrios Loll, MD  Discharge Date:  03/06/2017   Primary MD  Roselee Nova, MD  Recommendations for primary care physician for things to follow:   Follow-up with Dr. Earlyne Iba, on February 5 at 10 AM this appointment already made by Dr. Earlyne Iba:Marland Kitchen  Admission Diagnosis  Acute pulmonary edema ([place]) [J81.0] New onset atrial fibrillation (HCC) [I48.91] Atrial fibrillation with rapid ventricular response (HCC) [I48.91] Acute congestive heart failure, unspecified heart failure type ([place]) [I50.9]   Discharge Diagnosis  Acute pulmonary edema ([place]) [J81.0] New onset atrial fibrillation ([place]) [I48.91] Atrial fibrillation with rapid ventricular response (HCC) [I48.91] Acute congestive heart failure, unspecified heart failure type ([place]) [I50.9]        Past Medical History:  Diagnosis Date  . CHF (congestive heart failure) ([place])   . Chronic cough   . COPD (chronic obstructive pulmonary disease) ([place])   . Elevated liver function tests   . Fibrosis, pulmonary, interstitial, diffuse ([place])   . GERD (gastroesophageal reflux disease)   . History of cocaine abuse   . Mediastinal lymphadenopathy   . Pulmonary fibrosis ([place])   . Right inguinal hernia     Past Surgical History:  Procedure Laterality Date  . HERNIA REPAIR Right 1973   open  . INGUINAL HERNIA REPAIR Right 11/08/2014   Procedure: LAPAROSCOPIC RIGHT INGUINAL HERNIA REPAIR;  Surgeon: Hubbard Robinson, MD;  Location: [facility];  Service: General;  Laterality: Right;  . knee arthroscopy Right 1972  . RIGHT/LEFT HEART CATH AND CORONARY ANGIOGRAPHY Right 03/05/2017   Procedure: RIGHT/LEFT HEART CATH AND CORONARY ANGIOGRAPHY;  Surgeon: Dionisio David, MD;   Location: [place] [facility];  Service: Cardiovascular;  Laterality: Right;       History of present illness and  Hospital Course:     Kindly see H&P for history of present illness and admission details, please review complete Labs, Consult reports and Test reports for all details in brief  HPI  from the history and physical done on the day of admission  67 year old male with history of COPD, pulmonary fibrosis presented to emergency room for shortness of breath, palpitations, orthopnea, PND.  Found to have atrial fibrillation with RVR, admitted to telemetry for acute CHF, new onset A. fib with RVR.  Hospital Course  #1 new onset atrial fibrillation with RVR: Patient received Cardizem drip, heparin drip on admission, seen by cardiology Dr. Neoma Laming, echocardiogram showed EF 20% and because of history of new onset CHF and A. fib patient is taken to cardiac cath ,it revealed normal coronaries, severe LV dysfunction with EF 20%, severe pulmonary hypertension. .   Started on amiodarone, patient is given instructions to take amiodarone 400 mg p.o. twice daily for 1 week followed by 400 mg p.o. daily, and 200 mg daily after 1 week as per my discussion with Dr. Earlyne Iba, over the phone and Dr. Neoma Laming will see the patient in his office as per discharge instruction and I told the patient about that, patient already has an appointment to see Dr.: In the office on February 5 at 10 AM.    Patient echocardiogram showed a EF 20% with severely dilated left ventricle, severe pulmonary hypertension, patient does take digoxin at home, patient can continue digoxin, Entresto that he was taking before.  2. acute CHF: No previous diagnosis of heart failure.  Patient received multiple  medications including Coreg, Lasix, and patient had hypotension but he was not symptomatic so we adjusted Coreg, losartan, Lasix at discharge and I told the patient that he needs to be careful that this and have a BP machine  at home and check BP before taking BP medicines.. #3 COPD: No wheezing, stable, continue home dose inhalers. 4.  Hypokalemia: Potassium improved with supplements.  Patient is to follow-up with cardiologist regarding potassium tablets while on diuretics. Patient is to follow-up with [facility] heart failure clinic also.   Discharge Condition: Stable   Follow UP  Follow-up Information    [facility] Follow up on 03/12/2017.   Specialty:  Cardiology Why:  at 9:40am Contact information: [address] [phone]       Dionisio David, MD Follow up in 3 day(s).   Specialty:  Cardiology Why:  Already has appointment with Dr.:Shaukhan on feb 5th  at 10 AM. Contact information: [place] [address] [phone]             Discharge Instructions  and  Discharge Medications     Allergies as of 03/06/2017   No Known Allergies     Medication List    TAKE these medications   amiodarone 400 MG tablet Commonly known as:  PACERONE Take 1 tablet (400 mg total) by mouth 2 (two) times daily. Amiodarone 400 mg p.o. twice daily for 1 week followed by 400 mg p.o. daily for 1 week, follow with Dr. Neoma Laming for further directions on amiodarone dosing.   digoxin 0.125 MG tablet Commonly known as:  LANOXIN Take 1 tablet (0.125 mg total) by mouth daily.   furosemide 20 MG tablet Commonly known as:  LASIX Take 1 tablet (20 mg total) by mouth daily.   hydroxychloroquine 200 MG tablet Commonly known as:  PLAQUENIL Take 2 tablets by mouth daily.   leflunomide 10 MG tablet Commonly known as:  ARAVA Take 1 tablet by mouth daily.   pantoprazole 40 MG tablet Commonly known as:  PROTONIX Take 1 tablet by mouth 1 day or 1 dose. In am   predniSONE 5 MG tablet Commonly known as:  DELTASONE Take 1 tablet by mouth daily.   PROVENTIL HFA 108 (90 Base) MCG/ACT inhaler Generic  drug:  albuterol Inhale 2 puffs into the lungs as needed.   sacubitril-valsartan 24-26 MG Commonly known as:  ENTRESTO Take 1 tablet by mouth 2 (two) times daily.   spironolactone 25 MG tablet Commonly known as:  ALDACTONE Take 0.5 tablets (12.5 mg total) by mouth daily.   SYMBICORT 80-4.5 MCG/ACT inhaler Generic drug:  budesonide-formoterol Take 2 puffs by mouth as needed. As needed.         Diet and Activity recommendation: See Discharge Instructions above   Consults obtained -cardiology   Major procedures and Radiology Reports - PLEASE review detailed and final reports for all details, in brief -     Dg Chest 2 View  Result Date: 03/04/2017 CLINICAL DATA:  Patient states that he saw his PCP this morning for his hands and was referred here for his chest. He has been SOB for 3 weeks. No other chest complaints. Pulmonary fibrosis. COPD. EXAM: CHEST  2 VIEW COMPARISON:  Previous chest x-ray 03/01/2013, chest CT 09/17/2015 FINDINGS: Heart size is normal. Chronic changes are identified throughout the lung bases at the periphery, with a similar pattern compared to the previous CT exam. Opacity within  lung bases has increased slightly, raising the question of progressive fibrosis or superimposed edema on chronic changes. No frank consolidations are identified. IMPRESSION: 1. Chronic honeycomb changes. 2. Possible superimposed bilateral edema or progressive fibrosis. Electronically Signed   By: Nolon Nations M.D.   On: 03/04/2017 10:57    Micro Results     No results found for this or any previous visit (from the past 240 hour(s)).     Today   Subjective:   Jason Fitzgerald today has no headache,no chest abdominal pain,no new weakness tingling or numbness, feels much better wants to go home today.   Objective:   Blood pressure (!) 82/44, pulse 80, temperature 98.3 F (36.8 C), temperature source Oral, resp. rate 20, height 6' (1.829 m), weight 96 kg (211 lb 9.6 oz),  SpO2 95 %.  No intake or output data in the 24 hours ending 03/13/17 0716  Exam Awake Alert, Oriented x 3, No new F.N deficits, Normal affect [place].AT,PERRAL Supple Neck,No JVD, No cervical lymphadenopathy appriciated.  Symmetrical Chest wall movement, Good air movement bilaterally, CTAB RRR,No Gallops,Rubs or new Murmurs, No Parasternal Heave +ve B.Sounds, Abd Soft, Non tender, No organomegaly appriciated, No rebound -guarding or rigidity. No Cyanosis, Clubbing or edema, No new Rash or bruise  Data Review   CBC w Diff:  Lab Results  Component Value Date   WBC 5.0 03/05/2017   HGB 13.9 03/05/2017   HCT 41.9 03/05/2017   PLT 249 03/05/2017   LYMPHOPCT 19 09/30/2016   MONOPCT 10 09/30/2016   EOSPCT 2 09/30/2016   BASOPCT 1 09/30/2016    CMP:  Lab Results  Component Value Date   NA 141 03/06/2017   K 3.3 (L) 03/06/2017   CL 101 03/06/2017   CO2 29 03/06/2017   BUN 13 03/06/2017   CREATININE 0.81 03/06/2017   PROT 7.8 03/04/2017   ALBUMIN 3.4 (L) 03/04/2017   BILITOT 1.3 (H) 03/04/2017   ALKPHOS 57 03/04/2017   AST 24 03/04/2017   ALT 11 (L) 03/04/2017  .   Total Time in preparing paper work, data evaluation and todays exam - 14 minutes  Epifanio Lesches M.D on 03/06/2017 at 7:16 AM    Note: This dictation was prepared with Dragon dictation along with smaller phrase technology. Any transcriptional errors that result from this process are unintentional.

## 2017-03-24 ENCOUNTER — Other Ambulatory Visit: Payer: Self-pay | Admitting: Specialist

## 2017-03-24 DIAGNOSIS — J84112 Idiopathic pulmonary fibrosis: Secondary | ICD-10-CM

## 2017-03-24 DIAGNOSIS — R0602 Shortness of breath: Secondary | ICD-10-CM

## 2017-04-07 ENCOUNTER — Ambulatory Visit
Admission: RE | Admit: 2017-04-07 | Discharge: 2017-04-07 | Disposition: A | Discharge status: Home / Self Care | Payer: PRIVATE HEALTH INSURANCE | Source: Ambulatory Visit | Attending: Specialist | Admitting: Specialist

## 2017-04-07 DIAGNOSIS — R0602 Shortness of breath: Secondary | ICD-10-CM

## 2017-04-07 DIAGNOSIS — I2510 Atherosclerotic heart disease of native coronary artery without angina pectoris: Secondary | ICD-10-CM | POA: Diagnosis not present

## 2017-04-07 DIAGNOSIS — J849 Interstitial pulmonary disease, unspecified: Secondary | ICD-10-CM | POA: Insufficient documentation

## 2017-04-07 DIAGNOSIS — J84112 Idiopathic pulmonary fibrosis: Secondary | ICD-10-CM

## 2017-04-07 DIAGNOSIS — I700 Atherosclerosis of aorta: Secondary | ICD-10-CM | POA: Diagnosis not present

## 2017-04-08 NOTE — Progress Notes (Signed)
Patient ID: Jason Fitzgerald, male    DOB: [birth year], 67 y.o.   MRN: [id number]  HPI  Jason Fitzgerald is a 67 y/o male with a history of COPD, pulmonary fibrosis, GERD, previous cocaine use and chronic heart failure.   Echo report from 03/04/17 showed an EF of 25-30% along with trivial AR, mild Jason, moderate TR and elevated PA pressure of 46 mm Hg. Cardiac catheterization done 03/05/17 which showed normal coronaries with severe LV dysfunction and severe pulmonary HTN.  Admitted 03/04/17 due to atrial fibrillation. Cardiology consult obtained. Medications were adjusted and he was discharged after 2 days.   He presents today for a follow-up visit with a chief complaint of minimal fatigue upon moderate exertion. He says this is chronic in nature and he does feel like it's improving over the last several months. He has associated shortness of breath, light-headedness, rhinorrhea and chronic difficulty sleeping. He denies abdominal distention, palpitations, edema, chest pain, cough or weight gain.   Past Medical History:  Diagnosis Date  . CHF (congestive heart failure) ([place])   . Chronic cough   . COPD (chronic obstructive pulmonary disease) ([place])   . Elevated liver function tests   . Fibrosis, pulmonary, interstitial, diffuse ([place])   . GERD (gastroesophageal reflux disease)   . History of cocaine abuse   . Mediastinal lymphadenopathy   . Pulmonary fibrosis ([place])   . Right inguinal hernia    Past Surgical History:  Procedure Laterality Date  . HERNIA REPAIR Right 1973   open  . INGUINAL HERNIA REPAIR Right 11/08/2014   Procedure: LAPAROSCOPIC RIGHT INGUINAL HERNIA REPAIR;  Surgeon: Hubbard Robinson, MD;  Location: [facility] ORS;  Service: General;  Laterality: Right;  . knee arthroscopy Right 1972  . RIGHT/LEFT HEART CATH AND CORONARY ANGIOGRAPHY Right 03/05/2017   Procedure: RIGHT/LEFT HEART CATH AND CORONARY ANGIOGRAPHY;  Surgeon: Dionisio David, MD;  Location: [place] [facility];  Service:  Cardiovascular;  Laterality: Right;   Family History  Problem Relation Age of Onset  . Diabetes Mother   . Cancer Father   . AAA (abdominal aortic aneurysm) Brother    Social History   Tobacco Use  . Smoking status: Never Smoker  . Smokeless tobacco: Never Used  Substance Use Topics  . Alcohol use: Yes    Alcohol/week: 0.0 oz    Comment: 2 quarts a week   No Known Allergies  Prior to Admission medications   Medication Sig Start Date End Date Taking? Authorizing Provider  amiodarone (PACERONE) 200 MG tablet Take 200 mg by mouth daily.   Yes [provider]  budesonide-formoterol (SYMBICORT) 160-4.5 MCG/ACT inhaler Inhale 2 puffs into the lungs as needed.   Yes [provider]  digoxin (LANOXIN) 0.125 MG tablet Take 1 tablet (0.125 mg total) by mouth daily. 03/06/17  Yes Epifanio Lesches, MD  furosemide (LASIX) 20 MG tablet Take 1 tablet (20 mg total) by mouth daily. 04/09/17 04/09/18 Yes Sparsh Callens, Otila Kluver A, FNP  hydroxychloroquine (PLAQUENIL) 200 MG tablet Take 2 tablets by mouth daily. 01/23/17  Yes [provider]  leflunomide (ARAVA) 10 MG tablet Take 1 tablet by mouth daily. 12/03/16  Yes [provider]  rivaroxaban (XARELTO) 20 MG TABS tablet Take 1 tablet (20 mg total) by mouth daily with supper. 04/09/17  Yes Shaquan Puerta A, FNP  sacubitril-valsartan (ENTRESTO) 24-26 MG Take 1 tablet by mouth 2 (two) times daily. 03/06/17  Yes Epifanio Lesches, MD  spironolactone (ALDACTONE) 25 MG tablet Take 0.5 tablets (  12.5 mg total) by mouth daily. 03/06/17 03/06/18 Yes Epifanio Lesches, MD  albuterol (PROVENTIL HFA) 108 (90 Base) MCG/ACT inhaler Inhale 2 puffs into the lungs as needed. 03/24/16 03/24/17  [provider]  pantoprazole (PROTONIX) 40 MG tablet Take 1 tablet by mouth 1 day or 1 dose. In am 09/19/14   [provider]  predniSONE (DELTASONE) 5 MG tablet Take 1 tablet by mouth daily. 01/22/17   [provider]   Review of  Systems  Constitutional: Positive for fatigue (better). Negative for appetite change.  HENT: Positive for rhinorrhea. Negative for congestion and sore throat.   Eyes: Negative.   Respiratory: Positive for shortness of breath. Negative for cough and chest tightness.   Cardiovascular: Negative for chest pain, palpitations and leg swelling.  Gastrointestinal: Negative for abdominal distention and abdominal pain.  Endocrine: Negative.   Genitourinary: Negative.   Musculoskeletal: Negative for back pain and neck pain.  Skin: Negative.   Allergic/Immunologic: Negative.   Neurological: Positive for light-headedness (with sudden position changes). Negative for dizziness.  Hematological: Negative for adenopathy. Does not bruise/bleed easily.  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 Negative for dysphoric mood. The patient is not nervous/anxious.    Vitals:   04/09/17 0918  BP: 104/77  Pulse: 71  Resp: 18  SpO2: 98%  Weight: 208 lb 2 oz (94.4 kg)  Height: 5\' 9"  (1.753 m)   Wt Readings from Last 3 Encounters:  04/09/17 208 lb 2 oz (94.4 kg)  03/12/17 208 lb 2 oz (94.4 kg)  03/06/17 211 lb 9.6 oz (96 kg)   Lab Results  Component Value Date   CREATININE 0.81 03/06/2017   CREATININE 0.77 03/05/2017   CREATININE 0.66 03/04/2017    Physical Exam  Constitutional: He is oriented to person, place, and time. He appears well-developed and well-nourished.  HENT:  Head: Normocephalic and atraumatic.  Neck: Normal range of motion. Neck supple. No JVD present.  Cardiovascular: Normal rate. An irregularly irregular rhythm present.  Pulmonary/Chest: Effort normal. He has no wheezes. He has no rales.  Abdominal: Soft. He exhibits no distension. There is no tenderness.  Musculoskeletal: He exhibits no edema or tenderness.  Neurological: He is alert and oriented to person, place, and time.  Skin: Skin is warm and dry.  Psychiatric: He has a normal mood and affect.  His behavior is normal. Thought content normal.  Nursing note and vitals reviewed.  Assessment & Plan:  1: Chronic heart failure with reduced ejection fraction- - NYHA class II - euvolemic today - weighing daily and he says that his weight has been stable. 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 Patient didn't bring all his bottles and says that he has his  arthritis medications and inhalers at home. Encouraged him to bring all medications to his visits.  Return in 1 month or sooner for any questions/problems before then.

## 2017-04-09 ENCOUNTER — Encounter: Payer: Self-pay | Admitting: Family

## 2017-04-09 ENCOUNTER — Ambulatory Visit: Payer: PRIVATE HEALTH INSURANCE | Attending: Family | Admitting: Family

## 2017-04-09 VITALS — BP 104/77 | HR 71 | Resp 18 | Ht 69.0 in | Wt 208.1 lb

## 2017-04-09 DIAGNOSIS — I4891 Unspecified atrial fibrillation: Secondary | ICD-10-CM | POA: Diagnosis not present

## 2017-04-09 DIAGNOSIS — J449 Chronic obstructive pulmonary disease, unspecified: Secondary | ICD-10-CM | POA: Diagnosis not present

## 2017-04-09 DIAGNOSIS — I5022 Chronic systolic (congestive) heart failure: Secondary | ICD-10-CM

## 2017-04-09 DIAGNOSIS — I272 Pulmonary hypertension, unspecified: Secondary | ICD-10-CM | POA: Diagnosis not present

## 2017-04-09 DIAGNOSIS — Z7901 Long term (current) use of anticoagulants: Secondary | ICD-10-CM | POA: Diagnosis not present

## 2017-04-09 DIAGNOSIS — I509 Heart failure, unspecified: Secondary | ICD-10-CM | POA: Insufficient documentation

## 2017-04-09 DIAGNOSIS — K219 Gastro-esophageal reflux disease without esophagitis: Secondary | ICD-10-CM | POA: Diagnosis not present

## 2017-04-09 DIAGNOSIS — I110 Hypertensive heart disease with heart failure: Secondary | ICD-10-CM | POA: Diagnosis not present

## 2017-04-09 DIAGNOSIS — F1421 Cocaine dependence, in remission: Secondary | ICD-10-CM | POA: Diagnosis not present

## 2017-04-09 DIAGNOSIS — J8410 Pulmonary fibrosis, unspecified: Secondary | ICD-10-CM | POA: Insufficient documentation

## 2017-04-09 DIAGNOSIS — I10 Essential (primary) hypertension: Secondary | ICD-10-CM

## 2017-04-09 DIAGNOSIS — I480 Paroxysmal atrial fibrillation: Secondary | ICD-10-CM

## 2017-04-09 DIAGNOSIS — Z79899 Other long term (current) drug therapy: Secondary | ICD-10-CM | POA: Diagnosis not present

## 2017-04-09 MED ORDER — FUROSEMIDE 20 MG PO TABS: 20.0000 mg | ORAL_TABLET | Freq: Every day | ORAL | 3 refills | Status: DC

## 2017-04-09 MED ORDER — RIVAROXABAN 20 MG PO TABS: 20.0000 mg | ORAL_TABLET | Freq: Every day | ORAL | 5 refills | Status: DC

## 2017-04-09 NOTE — Patient Instructions (Addendum)
Continue weighing daily and call for an overnight weight gain of > 2 pounds or a weekly weight gain of >5 pounds.  Call back with medications

## 2017-05-07 ENCOUNTER — Ambulatory Visit: Payer: No Typology Code available for payment source | Admitting: Family

## 2017-05-10 NOTE — Progress Notes (Signed)
Patient ID: Jason Fitzgerald, male    DOB: January 16, 1951, 67 y.o.   MRN: [id number]  HPI  Jason Fitzgerald is a 67 y/o male with a history of COPD, pulmonary fibrosis, GERD, previous cocaine use and chronic heart failure.   Echo report from 03/04/17 showed an EF of 25-30% along with trivial AR, mild Jason, moderate TR and elevated PA pressure of 46 mm Hg. Cardiac catheterization done 03/05/17 which showed normal coronaries with severe LV dysfunction and severe pulmonary HTN.  Admitted 03/04/17 due to atrial fibrillation. Cardiology consult obtained. Medications were adjusted and he was discharged after 2 days.   He presents today for a follow-up visit with a chief complaint of minimal fatigue upon moderate exertion. He says this has been chronic in nature having been present for several years. He has associated difficulty sleeping (works 3rd shift) and shortness of breath along with this. He denies any abdominal distention, palpitations, edema, chest pain, cough, dizziness or weight gain.     Past Medical History:  Diagnosis Date  . CHF (congestive heart failure) ([place])   . Chronic cough   . COPD (chronic obstructive pulmonary disease) ([place])   . Elevated liver function tests   . Fibrosis, pulmonary, interstitial, diffuse ([place])   . GERD (gastroesophageal reflux disease)   . History of cocaine abuse   . Mediastinal lymphadenopathy   . Pulmonary fibrosis ([place])   . Right inguinal hernia    Past Surgical History:  Procedure Laterality Date  . HERNIA REPAIR Right 1973   open  . INGUINAL HERNIA REPAIR Right 11/08/2014   Procedure: LAPAROSCOPIC RIGHT INGUINAL HERNIA REPAIR;  Surgeon: Hubbard Robinson, MD;  Location: [facility];  Service: General;  Laterality: Right;  . knee arthroscopy Right 1972  . RIGHT/LEFT HEART CATH AND CORONARY ANGIOGRAPHY Right 03/05/2017   Procedure: RIGHT/LEFT HEART CATH AND CORONARY ANGIOGRAPHY;  Surgeon: Dionisio David, MD;  Location: [place] [facility];  Service: Cardiovascular;   Laterality: Right;   Family History  Problem Relation Age of Onset  . Diabetes Mother   . Cancer Father   . AAA (abdominal aortic aneurysm) Brother    Social History   Tobacco Use  . Smoking status: Never Smoker  . Smokeless tobacco: Never Used  Substance Use Topics  . Alcohol use: Yes    Alcohol/week: 0.0 oz    Comment: 2 quarts a week   No Known Allergies  Prior to Admission medications   Medication Sig Start Date End Date Taking? Authorizing Provider  amiodarone (PACERONE) 200 MG tablet Take 200 mg by mouth daily.   Yes [provider]  budesonide-formoterol (SYMBICORT) 160-4.5 MCG/ACT inhaler Inhale 2 puffs into the lungs as needed.   Yes [provider]  digoxin (LANOXIN) 0.125 MG tablet Take 1 tablet (0.125 mg total) by mouth daily. 03/06/17  Yes Epifanio Lesches, MD  furosemide (LASIX) 20 MG tablet Take 1 tablet (20 mg total) by mouth daily. 04/09/17 04/09/18 Yes Hackney, Otila Kluver A, FNP  hydroxychloroquine (PLAQUENIL) 200 MG tablet Take 2 tablets by mouth daily. 01/23/17  Yes [provider]  leflunomide (ARAVA) 10 MG tablet Take 1 tablet by mouth daily. 12/03/16  Yes [provider]  predniSONE (DELTASONE) 5 MG tablet Take 1 tablet by mouth daily. 01/22/17  Yes [provider]  rivaroxaban (XARELTO) 20 MG TABS tablet Take 1 tablet (20 mg total) by mouth daily with supper. 04/09/17  Yes Darylene Price A, FNP  spironolactone (ALDACTONE) 25 MG tablet Take 0.5 tablets (  12.5 mg total) by mouth daily. 03/06/17 03/06/18 Yes Epifanio Lesches, MD  albuterol (PROVENTIL HFA) 108 (90 Base) MCG/ACT inhaler Inhale 2 puffs into the lungs as needed. 03/24/16 03/24/17  [provider]  pantoprazole (PROTONIX) 40 MG tablet Take 1 tablet by mouth 1 day or 1 dose. In am 09/19/14   [provider]    Review of Systems  Constitutional: Positive for fatigue (better). Negative for appetite change.  HENT: Positive for rhinorrhea. Negative for  congestion and sore throat.   Eyes: Negative.   Respiratory: Positive for shortness of breath. Negative for cough and chest tightness.   Cardiovascular: Negative for chest pain, palpitations and leg swelling.  Gastrointestinal: Negative for abdominal distention and abdominal pain.  Endocrine: Negative.   Genitourinary: Negative.   Musculoskeletal: Negative for back pain and neck pain.  Skin: Negative.   Allergic/Immunologic: Negative.   Neurological: Negative for dizziness and light-headedness.  Hematological: Negative for adenopathy. Does not bruise/bleed easily.  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 Negative for dysphoric mood. The patient is not nervous/anxious.    Vitals:   05/11/17 1008  BP: 139/74  Pulse: 80  Resp: 18  SpO2: 97%  Weight: 202 lb 6 oz (91.8 kg)  Height: 5\' 9"  (1.753 m)   Wt Readings from Last 3 Encounters:  05/11/17 202 lb 6 oz (91.8 kg)  04/09/17 208 lb 2 oz (94.4 kg)  03/12/17 208 lb 2 oz (94.4 kg)   Lab Results  Component Value Date   CREATININE 0.81 03/06/2017   CREATININE 0.77 03/05/2017   CREATININE 0.66 03/04/2017    Physical Exam  Constitutional: He is oriented to person, place, and time. He appears well-developed and well-nourished.  HENT:  Head: Normocephalic and atraumatic.  Neck: Normal range of motion. Neck supple. No JVD present.  Cardiovascular: Normal rate. An irregularly irregular rhythm present.  Pulmonary/Chest: Effort normal. He has no wheezes. He has no rales.  Abdominal: Soft. He exhibits no distension. There is no tenderness.  Musculoskeletal: He exhibits no edema or tenderness.  Neurological: He is alert and oriented to person, place, and time.  Skin: Skin is warm and dry.  Psychiatric: He has a normal mood and affect. His behavior is normal. Thought content normal.  Nursing note and vitals reviewed.  Assessment & Plan:  1: Chronic heart failure with reduced ejection fraction- - NYHA  class II - euvolemic today - weighing daily and he says that his weight has gradually declined. Reminded to call for an overnight weight gain of >2 pounds or a weekly weight gain of >5 pounds - weight down 6 pounds since 04/09/17 - not adding salt to his food and he was reminded to closely follow a 2000mg  sodium diet - is now taking entresto 49/51mg  BID and tolerating that without known side effects - PharmD reconciled medications with the patient - saw cardiology Humphrey Rolls) last week - does work 3rd shift so reports chronic difficulty sleeping  2: HTN- - BP looks good today - BMP from 03/06/17 was reviewed and showed sodium 141, potassium 3.3 and GFR >60  3: Atrial fibrillation- - refill provided again for rivaroxaban as patient says that his pharmacy told him that they didn't receive it  4: Pulmonary HTN- - using his inhalers - saw pulmonologist Raul Del) 03/24/17  Medication bottles were reviewed.  Return in 3 months or sooner for any questions/problems before then.

## 2017-05-11 ENCOUNTER — Ambulatory Visit: Payer: No Typology Code available for payment source | Attending: Family | Admitting: Family

## 2017-05-11 ENCOUNTER — Encounter: Payer: Self-pay | Admitting: Family

## 2017-05-11 VITALS — BP 139/74 | HR 80 | Resp 18 | Ht 69.0 in | Wt 202.4 lb

## 2017-05-11 DIAGNOSIS — I5022 Chronic systolic (congestive) heart failure: Secondary | ICD-10-CM | POA: Insufficient documentation

## 2017-05-11 DIAGNOSIS — I4891 Unspecified atrial fibrillation: Secondary | ICD-10-CM | POA: Insufficient documentation

## 2017-05-11 DIAGNOSIS — Z79899 Other long term (current) drug therapy: Secondary | ICD-10-CM | POA: Diagnosis not present

## 2017-05-11 DIAGNOSIS — K219 Gastro-esophageal reflux disease without esophagitis: Secondary | ICD-10-CM | POA: Insufficient documentation

## 2017-05-11 DIAGNOSIS — Z7951 Long term (current) use of inhaled steroids: Secondary | ICD-10-CM | POA: Insufficient documentation

## 2017-05-11 DIAGNOSIS — J449 Chronic obstructive pulmonary disease, unspecified: Secondary | ICD-10-CM | POA: Diagnosis not present

## 2017-05-11 DIAGNOSIS — I480 Paroxysmal atrial fibrillation: Secondary | ICD-10-CM

## 2017-05-11 DIAGNOSIS — R5383 Other fatigue: Secondary | ICD-10-CM | POA: Diagnosis present

## 2017-05-11 DIAGNOSIS — J8410 Pulmonary fibrosis, unspecified: Secondary | ICD-10-CM | POA: Diagnosis not present

## 2017-05-11 DIAGNOSIS — Z7901 Long term (current) use of anticoagulants: Secondary | ICD-10-CM | POA: Insufficient documentation

## 2017-05-11 DIAGNOSIS — I272 Pulmonary hypertension, unspecified: Secondary | ICD-10-CM | POA: Insufficient documentation

## 2017-05-11 DIAGNOSIS — Z7952 Long term (current) use of systemic steroids: Secondary | ICD-10-CM | POA: Insufficient documentation

## 2017-05-11 DIAGNOSIS — I110 Hypertensive heart disease with heart failure: Secondary | ICD-10-CM | POA: Insufficient documentation

## 2017-05-11 DIAGNOSIS — I10 Essential (primary) hypertension: Secondary | ICD-10-CM

## 2017-05-11 MED ORDER — FUROSEMIDE 20 MG PO TABS: 20.0000 mg | ORAL_TABLET | Freq: Every day | ORAL | 3 refills | Status: DC

## 2017-05-11 MED ORDER — RIVAROXABAN 20 MG PO TABS: 20.0000 mg | ORAL_TABLET | Freq: Every day | ORAL | 3 refills | Status: DC

## 2017-05-11 MED ORDER — SPIRONOLACTONE 25 MG PO TABS: 12.5000 mg | ORAL_TABLET | Freq: Every day | ORAL | 3 refills | Status: DC

## 2017-05-11 NOTE — Patient Instructions (Signed)
Continue weighing daily and call for an overnight weight gain of > 2 pounds or a weekly weight gain of >5 pounds. 

## 2017-08-17 ENCOUNTER — Encounter: Payer: Self-pay | Admitting: Family

## 2017-08-17 ENCOUNTER — Ambulatory Visit: Payer: PRIVATE HEALTH INSURANCE | Attending: Family | Admitting: Family

## 2017-08-17 VITALS — BP 138/82 | HR 84 | Resp 18 | Ht 69.0 in | Wt 193.4 lb

## 2017-08-17 DIAGNOSIS — J8410 Pulmonary fibrosis, unspecified: Secondary | ICD-10-CM | POA: Insufficient documentation

## 2017-08-17 DIAGNOSIS — Z79899 Other long term (current) drug therapy: Secondary | ICD-10-CM | POA: Insufficient documentation

## 2017-08-17 DIAGNOSIS — I5022 Chronic systolic (congestive) heart failure: Secondary | ICD-10-CM | POA: Diagnosis not present

## 2017-08-17 DIAGNOSIS — I272 Pulmonary hypertension, unspecified: Secondary | ICD-10-CM | POA: Diagnosis not present

## 2017-08-17 DIAGNOSIS — Z833 Family history of diabetes mellitus: Secondary | ICD-10-CM | POA: Insufficient documentation

## 2017-08-17 DIAGNOSIS — Z7952 Long term (current) use of systemic steroids: Secondary | ICD-10-CM | POA: Diagnosis not present

## 2017-08-17 DIAGNOSIS — I10 Essential (primary) hypertension: Secondary | ICD-10-CM

## 2017-08-17 DIAGNOSIS — K219 Gastro-esophageal reflux disease without esophagitis: Secondary | ICD-10-CM | POA: Diagnosis not present

## 2017-08-17 DIAGNOSIS — J449 Chronic obstructive pulmonary disease, unspecified: Secondary | ICD-10-CM | POA: Insufficient documentation

## 2017-08-17 DIAGNOSIS — I480 Paroxysmal atrial fibrillation: Secondary | ICD-10-CM

## 2017-08-17 DIAGNOSIS — I110 Hypertensive heart disease with heart failure: Secondary | ICD-10-CM | POA: Diagnosis present

## 2017-08-17 DIAGNOSIS — Z9889 Other specified postprocedural states: Secondary | ICD-10-CM | POA: Insufficient documentation

## 2017-08-17 DIAGNOSIS — Z809 Family history of malignant neoplasm, unspecified: Secondary | ICD-10-CM | POA: Diagnosis not present

## 2017-08-17 DIAGNOSIS — Z8249 Family history of ischemic heart disease and other diseases of the circulatory system: Secondary | ICD-10-CM | POA: Insufficient documentation

## 2017-08-17 DIAGNOSIS — I4891 Unspecified atrial fibrillation: Secondary | ICD-10-CM | POA: Insufficient documentation

## 2017-08-17 DIAGNOSIS — Z7901 Long term (current) use of anticoagulants: Secondary | ICD-10-CM | POA: Insufficient documentation

## 2017-08-17 NOTE — Progress Notes (Signed)
Patient ID: Jason Fitzgerald, male    DOB: Aug 03, 1950, 67 y.o.   MRN: [id number]  HPI  Jason Fitzgerald is a 67 y/o male with a history of COPD, pulmonary fibrosis, GERD, previous cocaine use and chronic heart failure.   Echo report from 03/04/17 showed an EF of 25-30% along with trivial AR, mild Jason, moderate TR and elevated PA pressure of 46 mm Hg.   Cardiac catheterization done 03/05/17 which showed normal coronaries with severe LV dysfunction and severe pulmonary HTN.  Admitted 03/04/17 due to atrial fibrillation. Cardiology consult obtained. Medications were adjusted and he was discharged after 2 days.   He presents today for a follow-up visit with a chief complaint of minimal fatigue upon moderate exertion. He has associated cough, shortness of breath and difficulty sleeping (due to working 3rd shift). He denies any abdominal distention, weight gain, palpitations, pedal edema, chest pain and dizziness.   Past Medical History:  Diagnosis Date  . CHF (congestive heart failure) ([place])   . Chronic cough   . COPD (chronic obstructive pulmonary disease) ([place])   . Elevated liver function tests   . Fibrosis, pulmonary, interstitial, diffuse ([place])   . GERD (gastroesophageal reflux disease)   . History of cocaine abuse   . Mediastinal lymphadenopathy   . Pulmonary fibrosis ([place])   . Right inguinal hernia    Past Surgical History:  Procedure Laterality Date  . HERNIA REPAIR Right 1973   open  . INGUINAL HERNIA REPAIR Right 11/08/2014   Procedure: LAPAROSCOPIC RIGHT INGUINAL HERNIA REPAIR;  Surgeon: Hubbard Robinson, MD;  Location: [facility];  Service: General;  Laterality: Right;  . knee arthroscopy Right 1972  . RIGHT/LEFT HEART CATH AND CORONARY ANGIOGRAPHY Right 03/05/2017   Procedure: RIGHT/LEFT HEART CATH AND CORONARY ANGIOGRAPHY;  Surgeon: Dionisio David, MD;  Location: [place] [facility];  Service: Cardiovascular;  Laterality: Right;   Family History  Problem Relation Age of Onset  .  Diabetes Mother   . Cancer Father   . AAA (abdominal aortic aneurysm) Brother    Social History   Tobacco Use  . Smoking status: Never Smoker  . Smokeless tobacco: Never Used  Substance Use Topics  . Alcohol use: Yes    Alcohol/week: 0.0 oz    Comment: 2 quarts a week   No Known Allergies  Prior to Admission medications   Medication Sig Start Date End Date Taking? Authorizing Provider  amiodarone (PACERONE) 200 MG tablet Take 200 mg by mouth daily.   Yes [provider]  budesonide-formoterol (SYMBICORT) 160-4.5 MCG/ACT inhaler Inhale 2 puffs into the lungs as needed.   Yes [provider]  carvedilol (COREG) 6.25 MG tablet Take 3.125 mg by mouth 2 (two) times daily with a meal.   Yes [provider]  digoxin (LANOXIN) 0.125 MG tablet Take 1 tablet (0.125 mg total) by mouth daily. 03/06/17  Yes Epifanio Lesches, MD  furosemide (LASIX) 20 MG tablet Take 1 tablet (20 mg total) by mouth daily. 05/11/17 05/11/18 Yes Hackney, Otila Kluver A, FNP  hydroxychloroquine (PLAQUENIL) 200 MG tablet Take 2 tablets by mouth daily. 01/23/17  Yes [provider]  predniSONE (DELTASONE) 5 MG tablet Take 1 tablet by mouth daily. 01/22/17  Yes [provider]  rivaroxaban (XARELTO) 20 MG TABS tablet Take 1 tablet (20 mg total) by mouth daily with supper. 05/11/17  Yes Hackney, Tina A, FNP  sacubitril-valsartan (ENTRESTO) 49-51 MG Take 1 tablet by mouth 2 (two) times daily.   Yes  [provider]  spironolactone (ALDACTONE) 25 MG tablet Take 0.5 tablets (12.5 mg total) by mouth daily. 05/11/17 05/11/18 Yes Hackney, Otila Kluver A, FNP  albuterol (PROVENTIL HFA) 108 (90 Base) MCG/ACT inhaler Inhale 2 puffs into the lungs as needed. 03/24/16 03/24/17  [provider]   Review of Systems  Constitutional: Positive for fatigue (minimal). Negative for appetite change.  HENT: Negative for congestion, rhinorrhea and sore throat.   Eyes: Negative.   Respiratory: Positive for  cough (when eating or when getting excited) and shortness of breath. Negative for chest tightness.   Cardiovascular: Negative for chest pain, palpitations and leg swelling.  Gastrointestinal: Negative for abdominal distention and abdominal pain.  Endocrine: Negative.   Genitourinary: Negative.   Musculoskeletal: Negative for back pain and neck pain.  Skin: Negative.   Allergic/Immunologic: Negative.   Neurological: Negative for dizziness and light-headedness.  Hematological: Negative for adenopathy. Does not bruise/bleed easily.  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 Negative for dysphoric mood. The patient is not nervous/anxious.    Vitals:   08/17/17 0843  BP: 138/82  Pulse: 84  Resp: 18  SpO2: 99%  Weight: 193 lb 6 oz (87.7 kg)  Height: 5\' 9"  (1.753 m)   Wt Readings from Last 3 Encounters:  08/17/17 193 lb 6 oz (87.7 kg)  05/11/17 202 lb 6 oz (91.8 kg)  04/09/17 208 lb 2 oz (94.4 kg)   Lab Results  Component Value Date   CREATININE 0.81 03/06/2017   CREATININE 0.77 03/05/2017   CREATININE 0.66 03/04/2017    Physical Exam  Constitutional: He is oriented to person, place, and time. He appears well-developed and well-nourished.  HENT:  Head: Normocephalic and atraumatic.  Neck: Normal range of motion. Neck supple. No JVD present.  Cardiovascular: Normal rate. An irregularly irregular rhythm present.  Pulmonary/Chest: Effort normal. He has no wheezes. He has no rales.  Abdominal: Soft. He exhibits no distension. There is no tenderness.  Musculoskeletal: He exhibits edema (trace edema around left ankle). He exhibits no tenderness.  Neurological: He is alert and oriented to person, place, and time.  Skin: Skin is warm and dry.  Psychiatric: He has a normal mood and affect. His behavior is normal. Thought content normal.  Nursing note and vitals reviewed.  Assessment & Plan:  1: Chronic heart failure with reduced ejection fraction- -  NYHA class II - euvolemic today - weighing daily and he says that his weight has gradually declined. 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  Return in 4 months or sooner for any questions/problems before then.

## 2017-08-17 NOTE — Patient Instructions (Signed)
Continue weighing daily and call for an overnight weight gain of > 2 pounds or a weekly weight gain of >5 pounds. 

## 2017-10-13 DIAGNOSIS — M0579 Rheumatoid arthritis with rheumatoid factor of multiple sites without organ or systems involvement: Secondary | ICD-10-CM | POA: Insufficient documentation

## 2017-12-08 ENCOUNTER — Ambulatory Visit (INDEPENDENT_AMBULATORY_CARE_PROVIDER_SITE_OTHER): Payer: Medicare Other | Admitting: Family Medicine

## 2017-12-08 ENCOUNTER — Encounter: Payer: Self-pay | Admitting: Family Medicine

## 2017-12-08 VITALS — BP 132/84 | HR 80 | Temp 97.8°F | Resp 18 | Ht 69.0 in | Wt 191.2 lb

## 2017-12-08 DIAGNOSIS — Z23 Encounter for immunization: Secondary | ICD-10-CM

## 2017-12-08 DIAGNOSIS — R82998 Other abnormal findings in urine: Secondary | ICD-10-CM | POA: Diagnosis not present

## 2017-12-08 DIAGNOSIS — Z1159 Encounter for screening for other viral diseases: Secondary | ICD-10-CM

## 2017-12-08 DIAGNOSIS — F1020 Alcohol dependence, uncomplicated: Secondary | ICD-10-CM | POA: Diagnosis not present

## 2017-12-08 DIAGNOSIS — J432 Centrilobular emphysema: Secondary | ICD-10-CM

## 2017-12-08 DIAGNOSIS — E785 Hyperlipidemia, unspecified: Secondary | ICD-10-CM | POA: Diagnosis not present

## 2017-12-08 DIAGNOSIS — R42 Dizziness and giddiness: Secondary | ICD-10-CM

## 2017-12-08 DIAGNOSIS — R202 Paresthesia of skin: Secondary | ICD-10-CM

## 2017-12-08 DIAGNOSIS — F1021 Alcohol dependence, in remission: Secondary | ICD-10-CM | POA: Insufficient documentation

## 2017-12-08 DIAGNOSIS — D649 Anemia, unspecified: Secondary | ICD-10-CM

## 2017-12-08 DIAGNOSIS — R739 Hyperglycemia, unspecified: Secondary | ICD-10-CM

## 2017-12-08 LAB — POCT URINALYSIS DIPSTICK OB
Bilirubin, UA: NEGATIVE
Blood, UA: NEGATIVE
Glucose, UA: NEGATIVE
Ketones, UA: 80
LEUKOCYTES UA: NEGATIVE
NITRITE UA: NEGATIVE
PH UA: 6 (ref 5.0–8.0)
SPEC GRAV UA: 1.015 (ref 1.010–1.025)
Urobilinogen, UA: 1 E.U./dL

## 2017-12-08 NOTE — Progress Notes (Signed)
Name: Jason Fitzgerald   MRN: [id number]    DOB: 03-05-1950   Date:12/08/2017       Progress Note  Subjective  Chief Complaint  Chief Complaint  Patient presents with  . Dizziness    Onset-3 weeks, worst in the morning, has to sit back down before he falls.    HPI  Dizziness: going on for the past few weeks. He states denies spinning sensation, just feels light headed. It happens when he gets up and takes a few steps, he sits down and symptoms resolved. He denies any falls. He sees Dr. Chancy Milroy for CHF and patient states he was seen a few weeks ago and had tachycardia and was advised to take a new medication but he does not recall the name. ( he did not bring medication list or bottles with him today). No chest pain or palpitation. Upon reviewed labs from [facility], he has anemia, last potassium was also low and he has been drinking liquor on a regular basis. He drank 2 shots of gin last night. He states he drinks two bottles of water per day. He has noticed paresthesia on both feet and it may be secondary to B12 deficiency. He states his urine has been dark lately.   He has emphysema and CHF: he is on inhalers given by Dr. Raul Del, he has daily cough and SOB, he states mild activity causes SOB, mild lower extremity edema - usually at the end of the day.    Patient Active Problem List   Diagnosis Date Noted  . Alcoholism ([place]) 12/08/2017  . Rheumatoid arthritis involving multiple sites with positive rheumatoid factor ([place]) 10/13/2017  . Chronic systolic heart failure ([place]) 03/12/2017  . Atrial fibrillation ([place]) 03/12/2017  . Pulmonary HTN ([place]) 03/12/2017  . Arthritis of left knee 08/01/2016  . 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 Chronic cough 05/29/2015  . Chronic obstructive pulmonary disease ([place]) 11/20/2014  . CAFL (chronic airflow limitation) ([place]) 10/24/2014  . Arteriosclerosis of coronary artery 10/24/2014  . History of fundoplication 68/01/7516  . HLD (hyperlipidemia)  10/24/2014  . BP (high blood pressure) 10/24/2014  . LAD (lymphadenopathy), mediastinal 07/13/2013  . ILD (interstitial lung disease) ([place]) 06/19/2013  . Interstitial lung disease ([place]) 06/19/2013  . 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 HERNIA REPAIR Right 1973   open  . INGUINAL HERNIA REPAIR Right 11/08/2014   Procedure: LAPAROSCOPIC RIGHT INGUINAL HERNIA REPAIR;  Surgeon: Hubbard Robinson, MD;  Location: [facility];  Service: General;  Laterality: Right;  . knee arthroscopy Right 1972  . RIGHT/LEFT HEART CATH AND CORONARY ANGIOGRAPHY Right 03/05/2017   Procedure: RIGHT/LEFT HEART CATH AND CORONARY ANGIOGRAPHY;  Surgeon: Dionisio David, MD;  Location: [place] [facility];  Service: Cardiovascular;  Laterality: Right;    Family History  Problem Relation Age of Onset  . Diabetes Mother   . Cancer Father   . AAA (abdominal aortic aneurysm) Brother     Social History   Socioeconomic History  . Marital status: Divorced    Spouse name: Not on file  . Number of children: 4  . Years of education: Not on file  . Highest education level: Not on file  Occupational History  . Occupation: retired   Scientific laboratory technician  . Financial resource strain: Not hard at all  . Food insecurity:    Worry: Never true    Inability: Never true  . Transportation needs:  Medical: No    Non-medical: No  Tobacco Use  . Smoking status: Never Smoker  . Smokeless tobacco: Never Used  Substance and Sexual Activity  . Alcohol use: Yes    Alcohol/week: 0.0 standard drinks    Comment: 2 quarts a week  . Drug use: Not Currently    Types: Cocaine    Comment: as an young adult   . Sexual activity: Not Currently  Lifestyle  . Physical activity:    Days per week: 0 days    Minutes per session: 0 min  . Stress: Not on file  Relationships  . Social connections:    Talks on phone: More than three times a week    Gets together: More  than three times a week    Attends religious service: Never    Active member of club or organization: No    Attends meetings of clubs or organizations: Never    Relationship status: Divorced  . Intimate partner violence:    Fear of current or ex partner: No    Emotionally abused: No    Physically abused: No    Forced sexual activity: No  Other Topics Concern  . Not on file  Social History Narrative   Retired Summer of 2019   Lives alone     Current Outpatient Medications:  .  amiodarone (PACERONE) 200 MG tablet, Take 200 mg by mouth daily., Disp: , Rfl:  .  budesonide-formoterol (SYMBICORT) 160-4.5 MCG/ACT inhaler, Inhale 2 puffs into the lungs as needed., Disp: , Rfl:  .  carvedilol (COREG) 6.25 MG tablet, Take 3.125 mg by mouth 2 (two) times daily with a meal., Disp: , Rfl:  .  digoxin (LANOXIN) 0.125 MG tablet, Take 1 tablet (0.125 mg total) by mouth daily., Disp: 30 tablet, Rfl: 0 .  furosemide (LASIX) 20 MG tablet, Take 1 tablet (20 mg total) by mouth daily., Disp: 90 tablet, Rfl: 3 .  hydroxychloroquine (PLAQUENIL) 200 MG tablet, Take 2 tablets by mouth daily., Disp: , Rfl: 1 .  leflunomide (ARAVA) 10 MG tablet, Take by mouth., Disp: , Rfl:  .  metaxalone (SKELAXIN) 800 MG tablet, metaxalone 800 mg tablet  TAKE 1 TABLET (800 MG TOTAL) BY MOUTH 2 (TWO) TIMES DAILY AS NEEDED FOR MUSCLE SPASMS., Disp: , Rfl:  .  pantoprazole (PROTONIX) 40 MG tablet, TAKE 1 TABLET (40 MG TOTAL) BY MOUTH ONCE DAILY., Disp: , Rfl:  .  predniSONE (DELTASONE) 5 MG tablet, Take 1 tablet by mouth daily., Disp: , Rfl:  .  rivaroxaban (XARELTO) 20 MG TABS tablet, Take 1 tablet (20 mg total) by mouth daily with supper., Disp: 90 tablet, Rfl: 3 .  sacubitril-valsartan (ENTRESTO) 49-51 MG, Take 1 tablet by mouth 2 (two) times daily., Disp: , Rfl:  .  spironolactone (ALDACTONE) 25 MG tablet, Take 0.5 tablets (12.5 mg total) by mouth daily., Disp: 45 tablet, Rfl: 3 .  traMADol (ULTRAM) 50 MG tablet, tramadol 50  mg tablet  TAKE 1 TABLET BY MOUTH EVERY 6 HOURS AS NEEDED FOR UP TO 10 DAYS, Disp: , Rfl:  .  albuterol (PROVENTIL HFA) 108 (90 Base) MCG/ACT inhaler, Inhale 2 puffs into the lungs as needed., Disp: , Rfl:   No Known Allergies  I personally reviewed active problem list, medication list, allergies, family history, social history, health maintenance with the patient/caregiver today.   ROS  Constitutional: Negative for fever or weight change.  Respiratory: positive for cough, and shortness of breath.   Cardiovascular: Negative for chest  pain or palpitations.  Gastrointestinal: Negative for abdominal pain, no bowel changes.  Musculoskeletal: Negative for gait problem or joint swelling.  Skin: Negative for rash.  Neurological: Positive  for dizziness but no  headache.  No other specific complaints in a complete review of systems (except as listed in HPI above).  Objective  Vitals:   12/08/17 0826  BP: 132/84  Pulse: 80  Resp: 18  Temp: 97.8 F (36.6 C)  TempSrc: Oral  SpO2: 97%  Weight: 191 lb 3.2 oz (86.7 kg)  Height: 5\' 9"  (1.753 m)    Body mass index is 28.24 kg/m.  Physical Exam  Constitutional: Patient appears well-developed and well-nourished. Overweight. No distress.  HEENT: head atraumatic, normocephalic, pupils equal and reactive to light, strabismus  neck supple, throat within normal limits Cardiovascular: Normal rate, regular rhythm and normal heart sounds.  No murmur heard. No BLE edema. Pulmonary/Chest: Effort normal and breath sounds normal. No respiratory distress. Abdominal: Soft.  There is no tenderness. Neurological exam: wide base initially, romberg negative, no nystagmus, cranial nerves intact  Psychiatric: Patient has a normal mood and affect. behavior is normal. Judgment and thought content normal.  PHQ2/9: Depression screen [facility] 2/9 12/08/2017 08/17/2017 05/11/2017 04/09/2017 03/12/2017  Decreased Interest 0 0 0 0 0  Down, Depressed, Hopeless 0 0 0 0 0  PHQ  - 2 Score 0 0 0 0 0  Altered sleeping 0 - - - -  Tired, decreased energy 0 - - - -  Change in appetite 0 - - - -  Feeling bad or failure about yourself  0 - - - -  Trouble concentrating 0 - - - -  Moving slowly or fidgety/restless 0 - - - -  Suicidal thoughts 0 - - - -  PHQ-9 Score 0 - - - -  Difficult doing work/chores Not difficult at all - - - -     Fall Risk: Fall Risk  12/08/2017 08/17/2017 05/11/2017 04/09/2017 03/12/2017  Falls in the past year? 0 No No No No  Number falls in past yr: 0 - - - -  Injury with Fall? 0 - - - -    Functional Status Survey: Is the patient deaf or have difficulty hearing?: No Does the patient have difficulty seeing, even when wearing glasses/contacts?: Yes(glasses) Does the patient have difficulty concentrating, remembering, or making decisions?: No Does the patient have difficulty walking or climbing stairs?: No Does the patient have difficulty dressing or bathing?: No Does the patient have difficulty doing errands alone such as visiting a doctor's office or shopping?: No   Assessment & Plan  1. Dizziness  Explained likely multifactorial, he has anemia, drinks liquor, not drinking enough water, has CHF. We will check labs and return to discuss results soon   2. Centrilobular emphysema ([place])  Under the care of Dr. Raul Del, he never smoked but used drugs in the past   3. Alcoholism ([place])  Discussed importance of stop drinking, discussed referral but he states he will quit on his own, discussed AA - COMPLETE METABOLIC PANEL WITH GFR - Vitamin B1 - B12 and Folate Panel  4. Dyslipidemia  - Lipid panel  5. Anemia, unspecified type  - CBC with Differential/Platelet - Iron, TIBC and Ferritin Panel - B12 and Folate Panel  6. Hyperglycemia  - Hemoglobin A1c  7. Paresthesia of both feet  - B12 and Folate Panel  8. Need for hepatitis C screening test  - Hepatitis C Antibody  9. Dark urine  -  POC Urinalysis Dipstick OB  10. Need  for pneumococcal vaccine  He refused

## 2017-12-09 ENCOUNTER — Other Ambulatory Visit: Payer: Self-pay | Admitting: Family Medicine

## 2017-12-09 MED ORDER — POTASSIUM CHLORIDE CRYS ER 20 MEQ PO TBCR: 20.0000 meq | EXTENDED_RELEASE_TABLET | Freq: Every day | ORAL | 3 refills | Status: DC

## 2017-12-13 LAB — COMPLETE METABOLIC PANEL WITH GFR
AG Ratio: 0.9 (calc) — ABNORMAL LOW (ref 1.0–2.5)
ALT: 18 U/L (ref 9–46)
AST: 30 U/L (ref 10–35)
Albumin: 3.3 g/dL — ABNORMAL LOW (ref 3.6–5.1)
Alkaline phosphatase (APISO): 68 U/L (ref 40–115)
BUN/Creatinine Ratio: 20 (calc) (ref 6–22)
BUN: 11 mg/dL (ref 7–25)
CO2: 30 mmol/L (ref 20–32)
CREATININE: 0.54 mg/dL — AB (ref 0.70–1.25)
Calcium: 8.4 mg/dL — ABNORMAL LOW (ref 8.6–10.3)
Chloride: 95 mmol/L — ABNORMAL LOW (ref 98–110)
GFR, Est African American: 128 mL/min/{1.73_m2} (ref >=60)
GFR, Est Non African American: 110 mL/min/{1.73_m2} (ref >=60)
GLOBULIN: 3.5 g/dL (ref 1.9–3.7)
GLUCOSE: 76 mg/dL (ref 65–139)
Potassium: 3 mmol/L — ABNORMAL LOW (ref 3.5–5.3)
SODIUM: 139 mmol/L (ref 135–146)
Total Bilirubin: 1.1 mg/dL (ref 0.2–1.2)
Total Protein: 6.8 g/dL (ref 6.1–8.1)

## 2017-12-13 LAB — LIPID PANEL
CHOLESTEROL: 164 mg/dL (ref <200)
HDL: 80 mg/dL (ref >40)
LDL Cholesterol (Calc): 69 mg/dL (calc)
Non-HDL Cholesterol (Calc): 84 mg/dL (calc) (ref <130)
Total CHOL/HDL Ratio: 2.1 (calc) (ref <5.0)
Triglycerides: 73 mg/dL (ref <150)

## 2017-12-13 LAB — HEMOGLOBIN A1C
EAG (MMOL/L): 5.7 (calc)
Hgb A1c MFr Bld: 5.2 % of total Hgb (ref <5.7)
MEAN PLASMA GLUCOSE: 103 (calc)

## 2017-12-13 LAB — HEPATITIS C ANTIBODY
Hepatitis C Ab: NONREACTIVE
SIGNAL TO CUT-OFF: 0.06 (ref <1.00)

## 2017-12-13 LAB — CBC WITH DIFFERENTIAL/PLATELET
Basophils Absolute: 42 cells/uL (ref 0–200)
Basophils Relative: 0.8 %
Eosinophils Absolute: 78 cells/uL (ref 15–500)
Eosinophils Relative: 1.5 %
HEMATOCRIT: 39.1 % (ref 38.5–50.0)
Hemoglobin: 13.4 g/dL (ref 13.2–17.1)
LYMPHS ABS: 1461 {cells}/uL (ref 850–3900)
MCH: 29.6 pg (ref 27.0–33.0)
MCHC: 34.3 g/dL (ref 32.0–36.0)
MCV: 86.5 fL (ref 80.0–100.0)
MPV: 9.4 fL (ref 7.5–12.5)
Monocytes Relative: 9.8 %
NEUTROS ABS: 3110 {cells}/uL (ref 1500–7800)
NEUTROS PCT: 59.8 %
PLATELETS: 280 10*3/uL (ref 140–400)
RBC: 4.52 10*6/uL (ref 4.20–5.80)
RDW: 13.6 % (ref 11.0–15.0)
Total Lymphocyte: 28.1 %
WBC mixed population: 510 cells/uL (ref 200–950)
WBC: 5.2 10*3/uL (ref 3.8–10.8)

## 2017-12-13 LAB — IRON,TIBC AND FERRITIN PANEL
%SAT: 26 % (ref 20–48)
Ferritin: 255 ng/mL (ref 24–380)
IRON: 57 ug/dL (ref 50–180)
TIBC: 218 ug/dL — AB (ref 250–425)

## 2017-12-13 LAB — B12 AND FOLATE PANEL
Folate: 8.3 ng/mL
VITAMIN B 12: 388 pg/mL (ref 200–1100)

## 2017-12-13 LAB — VITAMIN B1: VITAMIN B1 (THIAMINE): 7 nmol/L — AB (ref 8–30)

## 2017-12-14 ENCOUNTER — Other Ambulatory Visit: Payer: Self-pay

## 2017-12-14 ENCOUNTER — Encounter: Payer: Self-pay | Admitting: Family

## 2017-12-14 ENCOUNTER — Ambulatory Visit: Payer: Medicare Other | Attending: Family | Admitting: Family

## 2017-12-14 VITALS — BP 116/70 | HR 90 | Resp 18 | Ht 69.0 in | Wt 193.0 lb

## 2017-12-14 DIAGNOSIS — R590 Localized enlarged lymph nodes: Secondary | ICD-10-CM | POA: Insufficient documentation

## 2017-12-14 DIAGNOSIS — J449 Chronic obstructive pulmonary disease, unspecified: Secondary | ICD-10-CM | POA: Insufficient documentation

## 2017-12-14 DIAGNOSIS — F1421 Cocaine dependence, in remission: Secondary | ICD-10-CM | POA: Diagnosis not present

## 2017-12-14 DIAGNOSIS — I509 Heart failure, unspecified: Secondary | ICD-10-CM | POA: Diagnosis present

## 2017-12-14 DIAGNOSIS — I4891 Unspecified atrial fibrillation: Secondary | ICD-10-CM | POA: Diagnosis not present

## 2017-12-14 DIAGNOSIS — I5022 Chronic systolic (congestive) heart failure: Secondary | ICD-10-CM

## 2017-12-14 DIAGNOSIS — J8410 Pulmonary fibrosis, unspecified: Secondary | ICD-10-CM | POA: Diagnosis not present

## 2017-12-14 DIAGNOSIS — I272 Pulmonary hypertension, unspecified: Secondary | ICD-10-CM | POA: Diagnosis not present

## 2017-12-14 DIAGNOSIS — Z7901 Long term (current) use of anticoagulants: Secondary | ICD-10-CM | POA: Insufficient documentation

## 2017-12-14 DIAGNOSIS — I10 Essential (primary) hypertension: Secondary | ICD-10-CM

## 2017-12-14 DIAGNOSIS — I110 Hypertensive heart disease with heart failure: Secondary | ICD-10-CM | POA: Insufficient documentation

## 2017-12-14 DIAGNOSIS — Z79899 Other long term (current) drug therapy: Secondary | ICD-10-CM | POA: Insufficient documentation

## 2017-12-14 DIAGNOSIS — I480 Paroxysmal atrial fibrillation: Secondary | ICD-10-CM

## 2017-12-14 DIAGNOSIS — K219 Gastro-esophageal reflux disease without esophagitis: Secondary | ICD-10-CM | POA: Diagnosis not present

## 2017-12-14 DIAGNOSIS — Z7952 Long term (current) use of systemic steroids: Secondary | ICD-10-CM | POA: Diagnosis not present

## 2017-12-14 NOTE — Progress Notes (Signed)
Patient ID: Jason Fitzgerald, male    DOB: 1950-12-30, 67 y.o.   MRN: [id number]  HPI  Jason Fitzgerald is a 67 y/o male with a history of COPD, pulmonary fibrosis, GERD, previous cocaine use and chronic heart failure.   Echo report from 03/04/17 showed an EF of 25-30% along with trivial AR, mild Jason, moderate TR and elevated PA pressure of 46 mm Hg.   Cardiac catheterization done 03/05/17 which showed normal coronaries with severe LV dysfunction and severe pulmonary HTN.  Has not been admitted or been in the ED in the last 6 months.   He presents today for a follow-up visit with a chief complaint of minimal fatigue upon moderate exertion. He describes this as chronic in nature having been present for several years. He has associated cough, slight pedal edema, dizziness, difficulty sleeping and numbness in his right foot. He denies any abdominal distention, palpitations, chest pain, shortness of breath or weight gain. Has recently been started on potassium tablets due to hypokalemia.   Past Medical History:  Diagnosis Date  . CHF (congestive heart failure) ([place])   . Chronic cough   . COPD (chronic obstructive pulmonary disease) ([place])   . Elevated liver function tests   . Fibrosis, pulmonary, interstitial, diffuse ([place])   . GERD (gastroesophageal reflux disease)   . History of cocaine abuse ([place])   . Mediastinal lymphadenopathy   . Pulmonary fibrosis ([place])   . Right inguinal hernia    Past Surgical History:  Procedure Laterality Date  . HERNIA REPAIR Right 1973   open  . INGUINAL HERNIA REPAIR Right 11/08/2014   Procedure: LAPAROSCOPIC RIGHT INGUINAL HERNIA REPAIR;  Surgeon: Hubbard Robinson, MD;  Location: [facility];  Service: General;  Laterality: Right;  . knee arthroscopy Right 1972  . RIGHT/LEFT HEART CATH AND CORONARY ANGIOGRAPHY Right 03/05/2017   Procedure: RIGHT/LEFT HEART CATH AND CORONARY ANGIOGRAPHY;  Surgeon: Dionisio David, MD;  Location: [facility];  Service:  Cardiovascular;  Laterality: Right;   Family History  Problem Relation Age of Onset  . Diabetes Mother   . Cancer Father   . AAA (abdominal aortic aneurysm) Brother    Social History   Tobacco Use  . Smoking status: Never Smoker  . Smokeless tobacco: Never Used  Substance Use Topics  . Alcohol use: Yes    Alcohol/week: 0.0 standard drinks    Comment: 2 quarts a week   No Known Allergies  Prior to Admission medications   Medication Sig Start Date End Date Taking? Authorizing Provider  albuterol (PROVENTIL HFA) 108 (90 Base) MCG/ACT inhaler Inhale 2 puffs into the lungs as needed. 03/24/16 12/14/17 Yes [provider]  amiodarone (PACERONE) 200 MG tablet Take 200 mg by mouth daily.   Yes [provider]  budesonide-formoterol (SYMBICORT) 160-4.5 MCG/ACT inhaler Inhale 2 puffs into the lungs as needed.   Yes [provider]  carvedilol (COREG) 6.25 MG tablet Take 3.125 mg by mouth 2 (two) times daily with a meal.   Yes [provider]  digoxin (LANOXIN) 0.125 MG tablet Take 1 tablet (0.125 mg total) by mouth daily. 03/06/17  Yes Epifanio Lesches, MD  furosemide (LASIX) 20 MG tablet Take 1 tablet (20 mg total) by mouth daily. 05/11/17 05/11/18 Yes , Otila Kluver A, FNP  hydroxychloroquine (PLAQUENIL) 200 MG tablet Take 2 tablets by mouth daily. 01/23/17  Yes [provider]  leflunomide (ARAVA) 10 MG tablet Take by mouth. 10/13/17  Yes [provider]  metaxalone (SKELAXIN) 800 MG tablet metaxalone 800 mg tablet  TAKE 1 TABLET (800 MG TOTAL) BY MOUTH 2 (TWO) TIMES DAILY AS NEEDED FOR MUSCLE SPASMS.   Yes [provider]  pantoprazole (PROTONIX) 40 MG tablet TAKE 1 TABLET (40 MG TOTAL) BY MOUTH ONCE DAILY. 09/09/15  Yes [provider]  potassium chloride SA (K-DUR,KLOR-CON) 20 MEQ tablet Take 1 tablet (20 mEq total) by mouth daily. 12/09/17  Yes Sowles, Drue Stager, MD  predniSONE (DELTASONE) 5 MG tablet Take 1 tablet by mouth  daily. 01/22/17  Yes [provider]  rivaroxaban (XARELTO) 20 MG TABS tablet Take 1 tablet (20 mg total) by mouth daily with supper. 05/11/17  Yes ,  A, FNP  sacubitril-valsartan (ENTRESTO) 49-51 MG Take 1 tablet by mouth 2 (two) times daily.   Yes [provider]  spironolactone (ALDACTONE) 25 MG tablet Take 0.5 tablets (12.5 mg total) by mouth daily. 05/11/17 05/11/18 Yes , Otila Kluver A, FNP  traMADol (ULTRAM) 50 MG tablet tramadol 50 mg tablet  TAKE 1 TABLET BY MOUTH EVERY 6 HOURS AS NEEDED FOR UP TO 10 DAYS   Yes [provider]    Review of Systems  Constitutional: Positive for fatigue (minimal). Negative for appetite change.  HENT: Negative for congestion, rhinorrhea and sore throat.   Eyes: Negative.   Respiratory: Positive for cough (when eating or when getting excited). Negative for chest tightness and shortness of breath.   Cardiovascular: Positive for leg swelling (slight swelling in right lower leg). Negative for chest pain and palpitations.  Gastrointestinal: Negative for abdominal distention and abdominal pain.  Endocrine: Negative.   Genitourinary: Negative.   Musculoskeletal: Positive for arthralgias (right shoulder pain). Negative for back pain and neck pain.  Skin: Negative.   Allergic/Immunologic: Negative.   Neurological: Positive for dizziness and numbness (right foot). Negative for light-headedness.  Hematological: Negative for adenopathy. Does not bruise/bleed easily.  Psychiatric/Behavioral: Positive for sleep disturbance (now retired; still having trouble sleeping). Negative for dysphoric mood. The patient is not nervous/anxious.    Vitals:   12/14/17 0849  BP: 116/70  Pulse: 90  Resp: 18  SpO2: 95%  Weight: 193 lb (87.5 kg)  Height: 5\' 9"  (1.753 m)   Wt Readings from Last 3 Encounters:  12/14/17 193 lb (87.5 kg)  12/08/17 191 lb 3.2 oz (86.7 kg)  08/17/17 193 lb 6 oz (87.7 kg)   Lab Results  Component Value Date    CREATININE 0.54 (L) 12/08/2017   CREATININE 0.81 03/06/2017   CREATININE 0.77 03/05/2017    Physical Exam  Constitutional: He is oriented to person, place, and time. He appears well-developed and well-nourished.  HENT:  Head: Normocephalic and atraumatic.  Neck: Normal range of motion. Neck supple. No JVD present.  Cardiovascular: Normal rate. An irregularly irregular rhythm present.  Pulmonary/Chest: Effort normal. He has no wheezes. He has no rales.  Abdominal: Soft. He exhibits no distension. There is no tenderness.  Musculoskeletal: He exhibits edema (trace edema around right ankle). He exhibits no tenderness.  Neurological: He is alert and oriented to person, place, and time.  Skin: Skin is warm and dry.  Psychiatric: He has a normal mood and affect. His behavior is normal. Thought content normal.  Nursing note and vitals reviewed.  Assessment & Plan:  1: Chronic heart failure with reduced ejection fraction- - NYHA class II - euvolemic today - weighing daily; Reminded to call for an overnight weight gain of >2 pounds or a weekly weight gain of >5 pounds -  weight unchanged from last visit 4 months ago - not adding salt to his food and he was reminded to closely follow a 2000mg  sodium diet - is taking entresto 49/51mg  BID  - BP will not allow titration of medications at this time - saw cardiology Humphrey Rolls) ~ 1 month ago and returns to him soon - retired a few weeks ago - received flu vaccine for this season  2: HTN- - BP looks good today - saw PCP Ancil Boozer) 12/08/17 - BMP from 12/08/17 was reviewed and showed sodium 139, potassium 3.0, creatinine 0.54 and GFR 128 - recently started taking potassium daily after lab results came back  3: Atrial fibrillation- - currently rate controlled - taking amiodarone, carvedilol, digoxin and rivaroxaban  4: Pulmonary HTN- - using his inhalers - saw pulmonologist Raul Del) 07/14/17  Patient did not bring his medications nor a list. Each  medication was verbally reviewed with the patient and he was encouraged to bring the bottles to every visit to confirm accuracy of list.  Return in 6 months or sooner for any questions/problems before then.

## 2017-12-14 NOTE — Patient Instructions (Signed)
Continue weighing daily and call for an overnight weight gain of > 2 pounds or a weekly weight gain of >5 pounds. 

## 2017-12-21 ENCOUNTER — Other Ambulatory Visit: Payer: Self-pay | Admitting: Specialist

## 2017-12-21 DIAGNOSIS — J849 Interstitial pulmonary disease, unspecified: Secondary | ICD-10-CM

## 2017-12-23 ENCOUNTER — Ambulatory Visit (INDEPENDENT_AMBULATORY_CARE_PROVIDER_SITE_OTHER): Payer: Medicare Other | Admitting: Family Medicine

## 2017-12-23 VITALS — BP 102/58 | HR 108 | Temp 97.6°F | Resp 16 | Ht 69.0 in | Wt 194.6 lb

## 2017-12-23 DIAGNOSIS — R42 Dizziness and giddiness: Secondary | ICD-10-CM | POA: Diagnosis not present

## 2017-12-23 DIAGNOSIS — E876 Hypokalemia: Secondary | ICD-10-CM

## 2017-12-23 DIAGNOSIS — I4891 Unspecified atrial fibrillation: Secondary | ICD-10-CM

## 2017-12-23 DIAGNOSIS — R808 Other proteinuria: Secondary | ICD-10-CM | POA: Diagnosis not present

## 2017-12-23 DIAGNOSIS — E519 Thiamine deficiency, unspecified: Secondary | ICD-10-CM | POA: Diagnosis not present

## 2017-12-23 DIAGNOSIS — R202 Paresthesia of skin: Secondary | ICD-10-CM

## 2017-12-23 MED ORDER — GABAPENTIN 100 MG PO CAPS: 100.0000 mg | ORAL_CAPSULE | Freq: Every day | ORAL | 0 refills | Status: DC

## 2017-12-23 MED ORDER — VITAMIN B-1 100 MG PO TABS: 100.0000 mg | ORAL_TABLET | Freq: Every day | ORAL | 1 refills | Status: DC

## 2017-12-23 NOTE — Progress Notes (Signed)
Name: Jason Fitzgerald   MRN: [id number]    DOB: 01-23-1951   Date:12/27/2017       Progress Note  Subjective  Chief Complaint  Chief Complaint  Patient presents with  . Follow-up    2 weeks f/u    HPI  Patient returned today to discuss labs and follow up   Fatigue/Neuropathy/alcoholism: he continues to feel tired, labs showed low thiamine level. He told me today he has not been drinking for months, but that contradicts what he told me two weeks ago. He has pain and numbness/burning on both feet. He states he is still tired but seems to be feeling better since he started taking potassium supplementation and no longer feeling as dizzy. He denies chest pain at this time   Patient Active Problem List   Diagnosis Date Noted  . Alcoholism ([place]) 12/08/2017  . Rheumatoid arthritis involving multiple sites with positive rheumatoid factor ([place]) 10/13/2017  . Chronic systolic heart failure ([place]) 03/12/2017  . Atrial fibrillation ([place]) 03/12/2017  . Pulmonary HTN ([place]) 03/12/2017  . Arthritis of left knee 08/01/2016  . 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 Chronic cough 05/29/2015  . Chronic obstructive pulmonary disease ([place]) 11/20/2014  . CAFL (chronic airflow limitation) ([place]) 10/24/2014  . Arteriosclerosis of coronary artery 10/24/2014  . History of fundoplication 38/75/6433  . HLD (hyperlipidemia) 10/24/2014  . BP (high blood pressure) 10/24/2014  . LAD (lymphadenopathy), mediastinal 07/13/2013  . ILD (interstitial lung disease) ([place]) 06/19/2013  . Interstitial lung disease ([place]) 06/19/2013  . 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 HERNIA REPAIR Right 1973   open  . INGUINAL HERNIA REPAIR Right 11/08/2014   Procedure: LAPAROSCOPIC RIGHT INGUINAL HERNIA REPAIR;  Surgeon: Hubbard Robinson, MD;  Location: [facility];  Service: General;  Laterality: Right;  . knee arthroscopy Right 1972  .  RIGHT/LEFT HEART CATH AND CORONARY ANGIOGRAPHY Right 03/05/2017   Procedure: RIGHT/LEFT HEART CATH AND CORONARY ANGIOGRAPHY;  Surgeon: Dionisio David, MD;  Location: [facility];  Service: Cardiovascular;  Laterality: Right;    Family History  Problem Relation Age of Onset  . Diabetes Mother   . Cancer Father   . AAA (abdominal aortic aneurysm) Brother     Social History   Socioeconomic History  . Marital status: Divorced    Spouse name: Not on file  . Number of children: 4  . Years of education: Not on file  . Highest education level: Not on file  Occupational History  . Occupation: retired   Scientific laboratory technician  . Financial resource strain: Not hard at all  . Food insecurity:    Worry: Never true    Inability: Never true  . Transportation needs:    Medical: No    Non-medical: No  Tobacco Use  . Smoking status: Never Smoker  . Smokeless tobacco: Never Used  Substance and Sexual Activity  . Alcohol use: Not Currently    Alcohol/week: 0.0 standard drinks    Comment: 2 quarts a week  . Drug use: Not Currently    Types: Cocaine    Comment: as an young adult   . Sexual activity: Not Currently  Lifestyle  . Physical activity:    Days per week: 0 days    Minutes per session: 0 min  . Stress: Not on file  Relationships  . Social connections:    Talks on phone: More than three times a week  Gets together: More than three times a week    Attends religious service: Never    Active member of club or organization: No    Attends meetings of clubs or organizations: Never    Relationship status: Divorced  . Intimate partner violence:    Fear of current or ex partner: No    Emotionally abused: No    Physically abused: No    Forced sexual activity: No  Other Topics Concern  . Not on file  Social History Narrative   Retired Summer of 2019   Lives alone     Current Outpatient Medications:  .  albuterol (PROVENTIL HFA) 108 (90 Base) MCG/ACT inhaler, Inhale 2 puffs  into the lungs as needed., Disp: , Rfl:  .  amiodarone (PACERONE) 200 MG tablet, Take 200 mg by mouth daily., Disp: , Rfl:  .  budesonide-formoterol (SYMBICORT) 160-4.5 MCG/ACT inhaler, Inhale 2 puffs into the lungs as needed., Disp: , Rfl:  .  carvedilol (COREG) 6.25 MG tablet, Take 3.125 mg by mouth 2 (two) times daily with a meal., Disp: , Rfl:  .  digoxin (LANOXIN) 0.125 MG tablet, Take 1 tablet (0.125 mg total) by mouth daily., Disp: 30 tablet, Rfl: 0 .  furosemide (LASIX) 20 MG tablet, Take 1 tablet (20 mg total) by mouth daily., Disp: 90 tablet, Rfl: 3 .  gabapentin (NEURONTIN) 100 MG capsule, Take 1 capsule (100 mg total) by mouth at bedtime. qhs and increase by one pill every 3 days to max of 3 at night for your foot, Disp: 90 capsule, Rfl: 0 .  hydroxychloroquine (PLAQUENIL) 200 MG tablet, Take 2 tablets by mouth daily., Disp: , Rfl: 1 .  leflunomide (ARAVA) 10 MG tablet, Take by mouth., Disp: , Rfl:  .  metaxalone (SKELAXIN) 800 MG tablet, metaxalone 800 mg tablet  TAKE 1 TABLET (800 MG TOTAL) BY MOUTH 2 (TWO) TIMES DAILY AS NEEDED FOR MUSCLE SPASMS., Disp: , Rfl:  .  pantoprazole (PROTONIX) 40 MG tablet, TAKE 1 TABLET (40 MG TOTAL) BY MOUTH ONCE DAILY., Disp: , Rfl:  .  potassium chloride SA (K-DUR,KLOR-CON) 20 MEQ tablet, Take 1 tablet (20 mEq total) by mouth daily., Disp: 30 tablet, Rfl: 3 .  predniSONE (DELTASONE) 5 MG tablet, Take 1 tablet by mouth daily., Disp: , Rfl:  .  rivaroxaban (XARELTO) 20 MG TABS tablet, Take 1 tablet (20 mg total) by mouth daily with supper., Disp: 90 tablet, Rfl: 3 .  sacubitril-valsartan (ENTRESTO) 49-51 MG, Take 1 tablet by mouth 2 (two) times daily., Disp: , Rfl:  .  spironolactone (ALDACTONE) 25 MG tablet, Take 0.5 tablets (12.5 mg total) by mouth daily., Disp: 45 tablet, Rfl: 3 .  thiamine (VITAMIN B-1) 100 MG tablet, Take 1 tablet (100 mg total) by mouth daily., Disp: 30 tablet, Rfl: 1 .  traMADol (ULTRAM) 50 MG tablet, tramadol 50 mg tablet  TAKE 1  TABLET BY MOUTH EVERY 6 HOURS AS NEEDED FOR UP TO 10 DAYS, Disp: , Rfl:   No Known Allergies  I personally reviewed active problem list, medication list, allergies, family history, social history with the patient/caregiver today.   ROS  Constitutional: Negative for fever or weight change.  Respiratory: Negative for cough and shortness of breath.   Cardiovascular: Negative for chest pain or palpitations.  Gastrointestinal: Negative for abdominal pain, no bowel changes.  Musculoskeletal: Negative for gait problem or joint swelling.  Skin: Negative for rash.  Neurological: Positive  for dizziness but no  headache.  No other  specific complaints in a complete review of systems (except as listed in HPI above).  Objective  Vitals:   12/27/17 1219  BP: (!) 102/58  Pulse: (!) 108  Resp: 16  Temp: 97.6 F (36.4 C)  TempSrc: Oral  SpO2: 98%  Weight: 194 lb 9.6 oz (88.3 kg)  Height: 5\' 9"  (1.753 m)    Body mass index is 28.74 kg/m.  Physical Exam  Constitutional: Patient appears tired but in no distress.  HEENT: head atraumatic, normocephalic, strabismus,  neck supple, throat within normal limits Cardiovascular: Normal rate, irregular rhythm   No murmur heard. Trace  BLE edema. Pulmonary/Chest: Effort normal and breath sounds normal. No respiratory distress. Abdominal: Soft.  There is no tenderness. Psychiatric: Patient has a normal mood and affect. behavior is normal.   Recent Results (from the past 2160 hour(s))  CBC with Differential/Platelet     Status: None   Collection Time: 12/08/17  9:16 AM  Result Value Ref Range   WBC 5.2 3.8 - 10.8 Thousand/uL   RBC 4.52 4.20 - 5.80 Million/uL   Hemoglobin 13.4 13.2 - 17.1 g/dL   HCT 39.1 38.5 - 50.0 %   MCV 86.5 80.0 - 100.0 fL   MCH 29.6 27.0 - 33.0 pg   MCHC 34.3 32.0 - 36.0 g/dL   RDW 13.6 11.0 - 15.0 %   Platelets 280 140 - 400 Thousand/uL   MPV 9.4 7.5 - 12.5 fL   Neutro Abs 3,110 1,500 - 7,800 cells/uL   Lymphs Abs  1,461 850 - 3,900 cells/uL   WBC mixed population 510 200 - 950 cells/uL   Eosinophils Absolute 78 15 - 500 cells/uL   Basophils Absolute 42 0 - 200 cells/uL   Neutrophils Relative % 59.8 %   Total Lymphocyte 28.1 %   Monocytes Relative 9.8 %   Eosinophils Relative 1.5 %   Basophils Relative 0.8 %  Lipid panel     Status: None   Collection Time: 12/08/17  9:16 AM  Result Value Ref Range   Cholesterol 164 <200 mg/dL   HDL 80 >40 mg/dL   Triglycerides 73 <150 mg/dL   LDL Cholesterol (Calc) 69 mg/dL (calc)    Comment: Reference range: <100 . Desirable range <100 mg/dL for primary prevention;   <70 mg/dL for patients with CHD or diabetic patients  with > or = 2 CHD risk factors. Marland Kitchen LDL-C is now calculated using the Martin-Hopkins  calculation, which is a validated novel method providing  better accuracy than the Friedewald equation in the  estimation of LDL-C.  Cresenciano Genre et al. Annamaria Helling. 6759;163(84): 2061-2068  (http://education.[facility].com/faq/FAQ164)    Total CHOL/HDL Ratio 2.1 <5.0 (calc)   Non-HDL Cholesterol (Calc) 84 <130 mg/dL (calc)    Comment: For patients with diabetes plus 1 major ASCVD risk  factor, treating to a non-HDL-C goal of <100 mg/dL  (LDL-C of <70 mg/dL) is considered a therapeutic  option.   Hemoglobin A1c     Status: None   Collection Time: 12/08/17  9:16 AM  Result Value Ref Range   Hgb A1c MFr Bld 5.2 <5.7 % of total Hgb    Comment: For the purpose of screening for the presence of diabetes: . <5.7%       Consistent with the absence of diabetes 5.7-6.4%    Consistent with increased risk for diabetes             (prediabetes) > or =6.5%  Consistent with diabetes . This assay result is consistent with  a decreased risk of diabetes. . Currently, no consensus exists regarding use of hemoglobin A1c for diagnosis of diabetes in children. . According to American Diabetes Association (ADA) guidelines, hemoglobin A1c <7.0% represents  optimal control in non-pregnant diabetic patients. Different metrics may apply to specific patient populations.  Standards of Medical Care in Diabetes(ADA). .    Mean Plasma Glucose 103 (calc)   eAG (mmol/L) 5.7 (calc)  COMPLETE METABOLIC PANEL WITH GFR     Status: Abnormal   Collection Time: 12/08/17  9:16 AM  Result Value Ref Range   Glucose, Bld 76 65 - 139 mg/dL    Comment: .        Non-fasting reference interval .    BUN 11 7 - 25 mg/dL   Creat 0.54 (L) 0.70 - 1.25 mg/dL    Comment: For patients >5 years of age, the reference limit for Creatinine is approximately 13% higher for people identified as African-American. .    GFR, Est Non African American 110 > OR = 60 mL/min/1.79m2   GFR, Est African American 128 > OR = 60 mL/min/1.64m2   BUN/Creatinine Ratio 20 6 - 22 (calc)   Sodium 139 135 - 146 mmol/L   Potassium 3.0 (L) 3.5 - 5.3 mmol/L   Chloride 95 (L) 98 - 110 mmol/L   CO2 30 20 - 32 mmol/L   Calcium 8.4 (L) 8.6 - 10.3 mg/dL   Total Protein 6.8 6.1 - 8.1 g/dL   Albumin 3.3 (L) 3.6 - 5.1 g/dL   Globulin 3.5 1.9 - 3.7 g/dL (calc)   AG Ratio 0.9 (L) 1.0 - 2.5 (calc)   Total Bilirubin 1.1 0.2 - 1.2 mg/dL   Alkaline phosphatase (APISO) 68 40 - 115 U/L   AST 30 10 - 35 U/L   ALT 18 9 - 46 U/L  Vitamin B1     Status: Abnormal   Collection Time: 12/08/17  9:16 AM  Result Value Ref Range   Vitamin B1 (Thiamine) 7 (L) 8 - 30 nmol/L    Comment: Marland Kitchen Vitamin supplementation within 24 hours prior to blood draw may affect the accuracy of the results. . This test was developed and its analytical performance characteristics have been determined by [place], [place]. It has not been cleared or approved by the U.S. Food and Drug Administration. This assay has been validated pursuant to the CLIA regulations and is used for clinical purposes. .   Iron, TIBC and Ferritin Panel     Status: Abnormal   Collection Time: 12/08/17  9:16 AM  Result  Value Ref Range   Iron 57 50 - 180 mcg/dL   TIBC 218 (L) 250 - 425 mcg/dL (calc)   %SAT 26 20 - 48 % (calc)   Ferritin 255 24 - 380 ng/mL  B12 and Folate Panel     Status: None   Collection Time: 12/08/17  9:16 AM  Result Value Ref Range   Vitamin B-12 388 200 - 1,100 pg/mL    Comment: . Please Note: Although the reference range for vitamin B12 is [phone] pg/mL, it has been reported that between 5 and 10% of patients with values between 200 and 400 pg/mL may experience neuropsychiatric and hematologic abnormalities due to occult B12 deficiency; less than 1% of patients with values above 400 pg/mL will have symptoms. .    Folate 8.3 ng/mL    Comment:  Reference Range                            Low:           <3.4                            Borderline:    3.4-5.4                            Normal:        >5.4 .   Hepatitis C antibody     Status: None   Collection Time: 12/08/17  9:16 AM  Result Value Ref Range   Hepatitis C Ab NON-REACTIVE NON-REACTI   SIGNAL TO CUT-OFF 0.06 <1.00    Comment: . HCV antibody was non-reactive. There is no laboratory  evidence of HCV infection. . In most cases, no further action is required. However, if recent HCV exposure is suspected, a test for HCV RNA (test code [phone]) is suggested. . For additional information please refer to http://education.[facility].com/faq/FAQ22v1 (This link is being provided for informational/ educational purposes only.) .   POC Urinalysis Dipstick OB     Status: Abnormal   Collection Time: 12/08/17  9:47 AM  Result Value Ref Range   Color, UA dark amber    Clarity, UA clear    Glucose, UA Negative Negative   Bilirubin, UA Negative    Ketones, UA 80     Comment: Large   Spec Grav, UA 1.015 1.010 - 1.025   Blood, UA Negative    pH, UA 6.0 5.0 - 8.0   POC,PROTEIN,UA Large (3+) (A) Negative, Trace   Urobilinogen, UA 1.0 0.2 or 1.0 E.U./dL   Nitrite, UA neg    Leukocytes,  UA Negative Negative   Appearance     Odor    Potassium     Status: None   Collection Time: 12/24/17 12:15 PM  Result Value Ref Range   Potassium 3.7 3.5 - 5.3 mmol/L  Magnesium     Status: None   Collection Time: 12/24/17 12:15 PM  Result Value Ref Range   Magnesium 2.0 1.5 - 2.5 mg/dL  Urine Microalbumin w/creat. ratio     Status: None   Collection Time: 12/24/17  3:47 PM  Result Value Ref Range   Creatinine, Urine 226 20 - 320 mg/dL   Microalb, Ur 2.5 mg/dL    Comment: Reference Range Not established    Microalb Creat Ratio 11 <30 mcg/mg creat    Comment: . The ADA defines abnormalities in albumin excretion as follows: Marland Kitchen Category         Result (mcg/mg creatinine) . Normal                    <30 Microalbuminuria         30-299  Clinical albuminuria   > OR = 300 . The ADA recommends that at least two of three specimens collected within a 3-6 month period be abnormal before considering a patient to be within a diagnostic category.       PHQ2/9: Depression screen [facility] 2/9 12/08/2017 08/17/2017 05/11/2017 04/09/2017 03/12/2017  Decreased Interest 0 0 0 0 0  Down, Depressed, Hopeless 0 0 0 0 0  PHQ - 2 Score 0 0 0 0 0  Altered sleeping 0 - - - -  Tired, decreased energy  0 - - - -  Change in appetite 0 - - - -  Feeling bad or failure about yourself  0 - - - -  Trouble concentrating 0 - - - -  Moving slowly or fidgety/restless 0 - - - -  Suicidal thoughts 0 - - - -  PHQ-9 Score 0 - - - -  Difficult doing work/chores Not difficult at all - - - -     Fall Risk: Fall Risk  12/14/2017 12/08/2017 08/17/2017 05/11/2017 04/09/2017  Falls in the past year? 1 0 No No No  Number falls in past yr: 0 0 - - -  Injury with Fall? 1 0 - - -      Assessment & Plan  1. Vitamin B1 deficiency  He states he quit drinking months ago, however he told me on his last visit 2 weeks ago that he had 2 shots of gin the night before  - thiamine (VITAMIN B-1) 100 MG tablet; Take 1 tablet (100 mg  total) by mouth daily.  Dispense: 30 tablet; Refill: 1  2. Other proteinuria  - Urine Microalbumin w/creat. ratio  3. Dizziness  stable  4. Atrial fibrillation, unspecified type [facility])  Under the care of Dr. Humphrey Rolls, reviewed previous notes and EKG's on medication, unlikely the cause of his symptoms   5. Paresthesia of both feet  - gabapentin (NEURONTIN) 100 MG capsule; Take 1 capsule (100 mg total) by mouth at bedtime. qhs and increase by one pill every 3 days to max of 3 at night for your foot  Dispense: 90 capsule; Refill: 0  6. Hypokalemia  - Potassium - Magnesium

## 2017-12-25 LAB — MICROALBUMIN / CREATININE URINE RATIO
CREATININE, URINE: 226 mg/dL (ref 20–320)
MICROALB UR: 2.5 mg/dL
Microalb Creat Ratio: 11 mcg/mg creat (ref <30)

## 2017-12-25 LAB — POTASSIUM: Potassium: 3.7 mmol/L (ref 3.5–5.3)

## 2017-12-25 LAB — MAGNESIUM: Magnesium: 2 mg/dL (ref 1.5–2.5)

## 2017-12-27 ENCOUNTER — Encounter: Payer: Self-pay | Admitting: Family Medicine

## 2017-12-28 ENCOUNTER — Ambulatory Visit: Payer: Medicare Other | Admitting: Family Medicine

## 2018-01-29 ENCOUNTER — Other Ambulatory Visit: Payer: Self-pay | Admitting: Family Medicine

## 2018-01-29 DIAGNOSIS — R202 Paresthesia of skin: Secondary | ICD-10-CM

## 2018-01-30 NOTE — Telephone Encounter (Signed)
How is he taking? Is it working

## 2018-01-31 NOTE — Telephone Encounter (Signed)
Patient states it is already at the pharmacy, he did titrate up to 3 capsules daily. Patient states it is slowing up his feet pain.

## 2018-02-03 ENCOUNTER — Emergency Department: Payer: Medicare Other

## 2018-02-03 ENCOUNTER — Encounter: Payer: Self-pay | Admitting: Emergency Medicine

## 2018-02-03 ENCOUNTER — Other Ambulatory Visit: Payer: Self-pay

## 2018-02-03 ENCOUNTER — Inpatient Hospital Stay
Admission: EM | Admit: 2018-02-03 | Discharge: 2018-02-05 | DRG: 640 | Disposition: A | Discharge status: Home / Self Care | Payer: Medicare Other | Attending: Internal Medicine | Admitting: Internal Medicine

## 2018-02-03 DIAGNOSIS — I509 Heart failure, unspecified: Secondary | ICD-10-CM | POA: Diagnosis not present

## 2018-02-03 DIAGNOSIS — J8410 Pulmonary fibrosis, unspecified: Secondary | ICD-10-CM | POA: Diagnosis present

## 2018-02-03 DIAGNOSIS — Z79899 Other long term (current) drug therapy: Secondary | ICD-10-CM | POA: Diagnosis not present

## 2018-02-03 DIAGNOSIS — I472 Ventricular tachycardia: Secondary | ICD-10-CM | POA: Diagnosis not present

## 2018-02-03 DIAGNOSIS — K219 Gastro-esophageal reflux disease without esophagitis: Secondary | ICD-10-CM | POA: Diagnosis not present

## 2018-02-03 DIAGNOSIS — E440 Moderate protein-calorie malnutrition: Secondary | ICD-10-CM | POA: Diagnosis present

## 2018-02-03 DIAGNOSIS — J449 Chronic obstructive pulmonary disease, unspecified: Secondary | ICD-10-CM | POA: Diagnosis not present

## 2018-02-03 DIAGNOSIS — I5031 Acute diastolic (congestive) heart failure: Secondary | ICD-10-CM | POA: Diagnosis not present

## 2018-02-03 DIAGNOSIS — Z9114 Patient's other noncompliance with medication regimen: Secondary | ICD-10-CM

## 2018-02-03 DIAGNOSIS — I340 Nonrheumatic mitral (valve) insufficiency: Secondary | ICD-10-CM | POA: Diagnosis not present

## 2018-02-03 DIAGNOSIS — Z6827 Body mass index (BMI) 27.0-27.9, adult: Secondary | ICD-10-CM

## 2018-02-03 DIAGNOSIS — E876 Hypokalemia: Secondary | ICD-10-CM | POA: Diagnosis not present

## 2018-02-03 DIAGNOSIS — R918 Other nonspecific abnormal finding of lung field: Secondary | ICD-10-CM | POA: Diagnosis not present

## 2018-02-03 DIAGNOSIS — I5023 Acute on chronic systolic (congestive) heart failure: Secondary | ICD-10-CM | POA: Diagnosis not present

## 2018-02-03 DIAGNOSIS — I370 Nonrheumatic pulmonary valve stenosis: Secondary | ICD-10-CM | POA: Diagnosis not present

## 2018-02-03 DIAGNOSIS — R002 Palpitations: Secondary | ICD-10-CM

## 2018-02-03 DIAGNOSIS — Z7951 Long term (current) use of inhaled steroids: Secondary | ICD-10-CM | POA: Diagnosis not present

## 2018-02-03 DIAGNOSIS — I361 Nonrheumatic tricuspid (valve) insufficiency: Secondary | ICD-10-CM | POA: Diagnosis not present

## 2018-02-03 LAB — BASIC METABOLIC PANEL
Anion gap: 9 (ref 5–15)
BUN: 11 mg/dL (ref 8–23)
CO2: 31 mmol/L (ref 22–32)
CREATININE: 0.68 mg/dL (ref 0.61–1.24)
Calcium: 8.4 mg/dL — ABNORMAL LOW (ref 8.9–10.3)
Chloride: 99 mmol/L (ref 98–111)
GFR calc Af Amer: >60 mL/min (ref >60)
GFR calc non Af Amer: >60 mL/min (ref >60)
Glucose, Bld: 113 mg/dL — ABNORMAL HIGH (ref 70–99)
Potassium: 2.5 mmol/L — CL (ref 3.5–5.1)
Sodium: 139 mmol/L (ref 135–145)

## 2018-02-03 LAB — CBC
HEMATOCRIT: 32.8 % — AB (ref 39.0–52.0)
Hemoglobin: 10.5 g/dL — ABNORMAL LOW (ref 13.0–17.0)
MCH: 26.1 pg (ref 26.0–34.0)
MCHC: 32 g/dL (ref 30.0–36.0)
MCV: 81.6 fL (ref 80.0–100.0)
Platelets: 367 10*3/uL (ref 150–400)
RBC: 4.02 MIL/uL — ABNORMAL LOW (ref 4.22–5.81)
RDW: 15.5 % (ref 11.5–15.5)
WBC: 5.4 10*3/uL (ref 4.0–10.5)
nRBC: 0 % (ref 0.0–0.2)

## 2018-02-03 LAB — TROPONIN I
Troponin I: <0.03 ng/mL (ref <0.03)
Troponin I: <0.03 ng/mL (ref <0.03)
Troponin I: <0.03 ng/mL (ref <0.03)

## 2018-02-03 LAB — POTASSIUM: Potassium: 3.1 mmol/L — ABNORMAL LOW (ref 3.5–5.1)

## 2018-02-03 LAB — BRAIN NATRIURETIC PEPTIDE: B Natriuretic Peptide: 869 pg/mL — ABNORMAL HIGH (ref 0.0–100.0)

## 2018-02-03 LAB — DIGOXIN LEVEL: Digoxin Level: <0.2 ng/mL — ABNORMAL LOW (ref 0.8–2.0)

## 2018-02-03 LAB — MAGNESIUM: Magnesium: 2 mg/dL (ref 1.7–2.4)

## 2018-02-03 MED ORDER — ACETAMINOPHEN 325 MG PO TABS: 650.0000 mg | ORAL_TABLET | Freq: Four times a day (QID) | ORAL | Status: DC | PRN

## 2018-02-03 MED ORDER — PANTOPRAZOLE SODIUM 40 MG PO TBEC
40.0000 mg | DELAYED_RELEASE_TABLET | Freq: Every day | ORAL | Status: DC
  Administered 2018-02-03 – 2018-02-05 (×3): 40 mg via ORAL
  Filled 2018-02-03 (×3): qty 1

## 2018-02-03 MED ORDER — HYDROXYCHLOROQUINE SULFATE 200 MG PO TABS
400.0000 mg | ORAL_TABLET | Freq: Every day | ORAL | Status: DC
  Administered 2018-02-03 – 2018-02-05 (×3): 400 mg via ORAL
  Filled 2018-02-03 (×3): qty 2

## 2018-02-03 MED ORDER — DIGOXIN 125 MCG PO TABS
0.1250 mg | ORAL_TABLET | Freq: Every day | ORAL | Status: DC
  Administered 2018-02-03 – 2018-02-05 (×3): 0.125 mg via ORAL
  Filled 2018-02-03 (×3): qty 1

## 2018-02-03 MED ORDER — TRAMADOL HCL 50 MG PO TABS: 50.0000 mg | ORAL_TABLET | Freq: Four times a day (QID) | ORAL | Status: DC | PRN

## 2018-02-03 MED ORDER — ONDANSETRON HCL 4 MG/2ML IJ SOLN: 4.0000 mg | Freq: Four times a day (QID) | INTRAMUSCULAR | Status: DC | PRN

## 2018-02-03 MED ORDER — CARVEDILOL 3.125 MG PO TABS
3.1250 mg | ORAL_TABLET | Freq: Two times a day (BID) | ORAL | Status: DC
  Administered 2018-02-03 – 2018-02-04 (×2): 3.125 mg via ORAL
  Filled 2018-02-03 (×3): qty 1

## 2018-02-03 MED ORDER — GABAPENTIN 300 MG PO CAPS
300.0000 mg | ORAL_CAPSULE | Freq: Every day | ORAL | Status: DC
  Administered 2018-02-03 – 2018-02-04 (×2): 300 mg via ORAL
  Filled 2018-02-03 (×2): qty 1

## 2018-02-03 MED ORDER — AMIODARONE HCL 200 MG PO TABS
200.0000 mg | ORAL_TABLET | Freq: Every day | ORAL | Status: DC
  Administered 2018-02-03 – 2018-02-04 (×2): 200 mg via ORAL
  Filled 2018-02-03 (×3): qty 1

## 2018-02-03 MED ORDER — RIVAROXABAN 20 MG PO TABS
20.0000 mg | ORAL_TABLET | Freq: Every day | ORAL | Status: DC
  Administered 2018-02-03 – 2018-02-04 (×2): 20 mg via ORAL
  Filled 2018-02-03 (×2): qty 1

## 2018-02-03 MED ORDER — SACUBITRIL-VALSARTAN 49-51 MG PO TABS
1.0000 | ORAL_TABLET | Freq: Two times a day (BID) | ORAL | Status: DC
  Administered 2018-02-03 – 2018-02-04 (×2): 1 via ORAL
  Filled 2018-02-03 (×7): qty 1

## 2018-02-03 MED ORDER — SENNOSIDES-DOCUSATE SODIUM 8.6-50 MG PO TABS: 1.0000 | ORAL_TABLET | Freq: Every evening | ORAL | Status: DC | PRN

## 2018-02-03 MED ORDER — ONDANSETRON HCL 4 MG PO TABS: 4.0000 mg | ORAL_TABLET | Freq: Four times a day (QID) | ORAL | Status: DC | PRN

## 2018-02-03 MED ORDER — ALBUTEROL SULFATE (2.5 MG/3ML) 0.083% IN NEBU: 3.0000 mL | INHALATION_SOLUTION | Freq: Four times a day (QID) | RESPIRATORY_TRACT | Status: DC | PRN

## 2018-02-03 MED ORDER — PREDNISONE 10 MG PO TABS
5.0000 mg | ORAL_TABLET | Freq: Every day | ORAL | Status: DC
  Administered 2018-02-03 – 2018-02-05 (×3): 5 mg via ORAL
  Filled 2018-02-03 (×3): qty 1

## 2018-02-03 MED ORDER — SODIUM CHLORIDE 0.9 % IV SOLN: 250.0000 mL | INTRAVENOUS | Status: DC | PRN

## 2018-02-03 MED ORDER — MOMETASONE FURO-FORMOTEROL FUM 200-5 MCG/ACT IN AERO
2.0000 | INHALATION_SPRAY | Freq: Two times a day (BID) | RESPIRATORY_TRACT | Status: DC
  Administered 2018-02-04: 2 via RESPIRATORY_TRACT
  Filled 2018-02-03: qty 8.8

## 2018-02-03 MED ORDER — MAGNESIUM SULFATE 2 GM/50ML IV SOLN
2.0000 g | Freq: Once | INTRAVENOUS | Status: AC
  Administered 2018-02-03: 2 g via INTRAVENOUS
  Filled 2018-02-03: qty 50

## 2018-02-03 MED ORDER — POTASSIUM CHLORIDE CRYS ER 20 MEQ PO TBCR
40.0000 meq | EXTENDED_RELEASE_TABLET | Freq: Once | ORAL | Status: AC
  Administered 2018-02-03: 40 meq via ORAL
  Filled 2018-02-03: qty 2

## 2018-02-03 MED ORDER — METAXALONE 800 MG PO TABS
800.0000 mg | ORAL_TABLET | Freq: Three times a day (TID) | ORAL | Status: DC
  Administered 2018-02-03 – 2018-02-05 (×5): 800 mg via ORAL
  Filled 2018-02-03 (×8): qty 1

## 2018-02-03 MED ORDER — LEFLUNOMIDE 20 MG PO TABS
10.0000 mg | ORAL_TABLET | Freq: Every day | ORAL | Status: DC
  Administered 2018-02-03 – 2018-02-05 (×3): 10 mg via ORAL
  Filled 2018-02-03 (×3): qty 0.5

## 2018-02-03 MED ORDER — ACETAMINOPHEN 650 MG RE SUPP: 650.0000 mg | Freq: Four times a day (QID) | RECTAL | Status: DC | PRN

## 2018-02-03 MED ORDER — POTASSIUM CHLORIDE CRYS ER 20 MEQ PO TBCR: 40.0000 meq | EXTENDED_RELEASE_TABLET | Freq: Every day | ORAL | Status: DC

## 2018-02-03 MED ORDER — FUROSEMIDE 10 MG/ML IJ SOLN
40.0000 mg | Freq: Two times a day (BID) | INTRAMUSCULAR | Status: DC
  Administered 2018-02-03 – 2018-02-04 (×3): 40 mg via INTRAVENOUS
  Filled 2018-02-03 (×4): qty 4

## 2018-02-03 MED ORDER — SODIUM CHLORIDE 0.9% FLUSH: 3.0000 mL | INTRAVENOUS | Status: DC | PRN

## 2018-02-03 MED ORDER — POTASSIUM CHLORIDE 10 MEQ/100ML IV SOLN
10.0000 meq | INTRAVENOUS | Status: AC
  Administered 2018-02-03 (×4): 10 meq via INTRAVENOUS
  Filled 2018-02-03 (×4): qty 100

## 2018-02-03 MED ORDER — VITAMIN B-1 100 MG PO TABS
100.0000 mg | ORAL_TABLET | Freq: Every day | ORAL | Status: DC
  Administered 2018-02-03 – 2018-02-05 (×3): 100 mg via ORAL
  Filled 2018-02-03 (×3): qty 1

## 2018-02-03 MED ORDER — SPIRONOLACTONE 25 MG PO TABS
12.5000 mg | ORAL_TABLET | Freq: Every day | ORAL | Status: DC
  Administered 2018-02-03 – 2018-02-05 (×3): 12.5 mg via ORAL
  Filled 2018-02-03: qty 1
  Filled 2018-02-03 (×2): qty 0.5
  Filled 2018-02-03: qty 1
  Filled 2018-02-03: qty 0.5

## 2018-02-03 MED ORDER — IPRATROPIUM-ALBUTEROL 0.5-2.5 (3) MG/3ML IN SOLN
3.0000 mL | Freq: Once | RESPIRATORY_TRACT | Status: AC
  Administered 2018-02-03: 3 mL via RESPIRATORY_TRACT
  Filled 2018-02-03: qty 3

## 2018-02-03 MED ORDER — SODIUM CHLORIDE 0.9% FLUSH
3.0000 mL | Freq: Two times a day (BID) | INTRAVENOUS | Status: DC
  Administered 2018-02-03 – 2018-02-05 (×4): 3 mL via INTRAVENOUS

## 2018-02-03 NOTE — ED Notes (Signed)
Patient transported to X-ray 

## 2018-02-03 NOTE — ED Notes (Signed)
Attempted to call report-secretary says call back in 5 mins

## 2018-02-03 NOTE — H&P (Signed)
[facility] [place] NAME: Jason Fitzgerald    MR#:  [id number]  DATE OF BIRTH:  1950/06/19  DATE OF ADMISSION:  02/03/2018  PRIMARY CARE PHYSICIAN: Steele Sizer, MD   REQUESTING/REFERRING PHYSICIAN:   CHIEF COMPLAINT:   Chief Complaint  Patient presents with  . Palpitations    HISTORY OF PRESENT ILLNESS: Jason Fitzgerald  is a 68 y.o. male with a known history of congestive heart failure, COPD, pulmonary fibrosis on oral steroids, mediastinal adenopathy, cocaine abuse presented to the emergency room for swelling in the lower extremities and difficulty breathing.  Patient noticed increased swelling in both the legs.  He has been retaining fluid.  Patient also has shortness of breath which worsened for the last 1 week.  Patient is not taking his medications.  He was evaluated in the emergency room.  His BNP is elevated.  Potassium is critically low.  Hospitalist service was consulted for further care.    PAST MEDICAL HISTORY:   Past Medical History:  Diagnosis Date  . CHF (congestive heart failure) ([place])   . Chronic cough   . COPD (chronic obstructive pulmonary disease) ([place])   . Elevated liver function tests   . Fibrosis, pulmonary, interstitial, diffuse ([place])   . GERD (gastroesophageal reflux disease)   . History of cocaine abuse ([place])   . Mediastinal lymphadenopathy   . Pulmonary fibrosis ([place])   . Right inguinal hernia     PAST SURGICAL HISTORY:  Past Surgical History:  Procedure Laterality Date  . HERNIA REPAIR Right 1973   open  . INGUINAL HERNIA REPAIR Right 11/08/2014   Procedure: LAPAROSCOPIC RIGHT INGUINAL HERNIA REPAIR;  Surgeon: Hubbard Robinson, MD;  Location: [facility] ORS;  Service: General;  Laterality: Right;  . knee arthroscopy Right 1972  . RIGHT/LEFT HEART CATH AND CORONARY ANGIOGRAPHY Right 03/05/2017   Procedure: RIGHT/LEFT HEART CATH AND CORONARY ANGIOGRAPHY;  Surgeon: Dionisio David, MD;  Location: [facility];  Service: Cardiovascular;  Laterality: Right;    SOCIAL HISTORY:  Social History   Tobacco Use  . Smoking status: Never Smoker  . Smokeless tobacco: Never Used  Substance Use Topics  . Alcohol use: Not Currently    Alcohol/week: 0.0 standard drinks    Comment: 2 quarts a week    FAMILY HISTORY:  Family History  Problem Relation Age of Onset  . Diabetes Mother   . Cancer Father   . AAA (abdominal aortic aneurysm) Brother     DRUG ALLERGIES: No Known Allergies  REVIEW OF SYSTEMS:   CONSTITUTIONAL: No fever, fatigue or weakness.  EYES: No blurred or double vision.  EARS, NOSE, AND THROAT: No tinnitus or ear pain.  RESPIRATORY: No cough,has shortness of breath, no wheezing or hemoptysis.  CARDIOVASCULAR: No chest pain, orthopnea,  Has edema.  GASTROINTESTINAL: No nausea, vomiting, diarrhea or abdominal pain.  GENITOURINARY: No dysuria, hematuria.  ENDOCRINE: No polyuria, nocturia,  HEMATOLOGY: No anemia, easy bruising or bleeding SKIN: No rash or lesion. MUSCULOSKELETAL: No joint pain or arthritis.   NEUROLOGIC: No tingling, numbness, weakness.  PSYCHIATRY: No anxiety or depression.   MEDICATIONS AT HOME:  Prior to Admission medications   Medication Sig Start Date End Date Taking? Authorizing Provider  albuterol (PROVENTIL HFA) 108 (90 Base) MCG/ACT inhaler Inhale 2 puffs into the lungs as needed. 03/24/16 12/14/17  [provider]  amiodarone (PACERONE) 200 MG tablet Take 200 mg by mouth daily.    [provider]  budesonide-formoterol (SYMBICORT) 160-4.5 MCG/ACT inhaler Inhale 2 puffs into the lungs as needed.    [provider]  carvedilol (COREG) 6.25 MG tablet Take 3.125 mg by mouth 2 (two) times daily with a meal.    [provider]  digoxin (LANOXIN) 0.125 MG tablet Take 1 tablet (0.125 mg total) by mouth daily. 03/06/17   Epifanio Lesches, MD  furosemide (LASIX) 20 MG tablet Take 1 tablet (20 mg total) by  mouth daily. 05/11/17 05/11/18  Alisa Graff, FNP  gabapentin (NEURONTIN) 100 MG capsule Take 3 capsules (300 mg total) by mouth at bedtime. 02/01/18 05/02/18  Hubbard Hartshorn, FNP  hydroxychloroquine (PLAQUENIL) 200 MG tablet Take 2 tablets by mouth daily. 01/23/17   [provider]  leflunomide (ARAVA) 10 MG tablet Take by mouth. 10/13/17   [provider]  metaxalone (SKELAXIN) 800 MG tablet metaxalone 800 mg tablet  TAKE 1 TABLET (800 MG TOTAL) BY MOUTH 2 (TWO) TIMES DAILY AS NEEDED FOR MUSCLE SPASMS.    [provider]  pantoprazole (PROTONIX) 40 MG tablet TAKE 1 TABLET (40 MG TOTAL) BY MOUTH ONCE DAILY. 09/09/15   [provider]  potassium chloride SA (K-DUR,KLOR-CON) 20 MEQ tablet Take 1 tablet (20 mEq total) by mouth daily. 12/09/17   Steele Sizer, MD  predniSONE (DELTASONE) 5 MG tablet Take 1 tablet by mouth daily. 01/22/17   [provider]  rivaroxaban (XARELTO) 20 MG TABS tablet Take 1 tablet (20 mg total) by mouth daily with supper. 05/11/17   Alisa Graff, FNP  sacubitril-valsartan (ENTRESTO) 49-51 MG Take 1 tablet by mouth 2 (two) times daily.    [provider]  spironolactone (ALDACTONE) 25 MG tablet Take 0.5 tablets (12.5 mg total) by mouth daily. 05/11/17 05/11/18  Alisa Graff, FNP  thiamine (VITAMIN B-1) 100 MG tablet Take 1 tablet (100 mg total) by mouth daily. 12/23/17   Steele Sizer, MD  traMADol (ULTRAM) 50 MG tablet tramadol 50 mg tablet  TAKE 1 TABLET BY MOUTH EVERY 6 HOURS AS NEEDED FOR UP TO 10 DAYS    [provider]      PHYSICAL EXAMINATION:   VITAL SIGNS: Blood pressure 108/72, pulse 95, temperature 97.8 F (36.6 C), temperature source Oral, resp. rate (!) 23, height 5\' 9"  (1.753 m), weight 85.7 kg, SpO2 97 %.  GENERAL:  68 y.o.-year-old patient lying in the bed with no acute distress. lying in the bed with no acute distress.  EYES: Pupils equal, round, reactive to light and accommodation. No scleral icterus. Extraocular muscles intact.   HEENT: Head atraumatic, normocephalic. Oropharynx and nasopharynx clear.  NECK:  Supple, no jugular venous distention. No thyroid enlargement, no tenderness.  LUNGS: Decreased breath sounds bilaterally,bibasilar crepitations heard. No use of accessory muscles of respiration.  CARDIOVASCULAR: S1, S2 normal. No murmurs, rubs, or gallops.  ABDOMEN: Soft, nontender, nondistended. Bowel sounds present. No organomegaly or mass.  EXTREMITIES: Has pedal edema,  No cyanosis, or clubbing.  NEUROLOGIC: Cranial nerves II through XII are intact. Muscle strength 5/5 in all extremities. Sensation intact. Gait not checked.  PSYCHIATRIC: The patient is alert and oriented x 3.  SKIN: No obvious rash, lesion, or ulcer.   LABORATORY PANEL:   CBC Recent Labs  Lab 02/03/18 1000  WBC 5.4  HGB 10.5*  HCT 32.8*  PLT 367  MCV 81.6  MCH 26.1  MCHC 32.0  RDW 15.5   ------------------------------------------------------------------------------------------------------------------  Chemistries  Recent Labs  Lab 02/03/18 1000  NA 139  K 2.5*  CL 99  CO2 31  GLUCOSE 113*  BUN 11  CREATININE 0.68  CALCIUM 8.4*  MG 2.0   ------------------------------------------------------------------------------------------------------------------ estimated creatinine clearance is 98.5 mL/min (by C-G formula based on SCr of 0.68 mg/dL). ------------------------------------------------------------------------------------------------------------------ No results for input(s): TSH, T4TOTAL, T3FREE, THYROIDAB in the last 72 hours.  Invalid input(s): FREET3   Coagulation profile No results for input(s): INR, PROTIME in the last 168 hours. ------------------------------------------------------------------------------------------------------------------- No results for input(s): DDIMER in the last 72  hours. -------------------------------------------------------------------------------------------------------------------  Cardiac Enzymes Recent Labs  Lab 02/03/18 1000  TROPONINI <0.03   ------------------------------------------------------------------------------------------------------------------ Invalid input(s): POCBNP  ---------------------------------------------------------------------------------------------------------------  Urinalysis    Component Value Date/Time   GLUCOSEU Negative 12/08/2017 0947   BILIRUBINUR Negative 12/08/2017 0947   UROBILINOGEN 1.0 12/08/2017 0947   NITRITE neg 12/08/2017 0947   LEUKOCYTESUR Negative 12/08/2017 0947     RADIOLOGY: Dg Chest 2 View  Result Date: 02/03/2018 CLINICAL DATA:  Cardiac palpitations. EXAM: CHEST - 2 VIEW COMPARISON:  Radiographs of March 04, 2017. CT scan of April 07, 2017. FINDINGS: The heart size and mediastinal contours are within normal limits. No pneumothorax is noted. Stable coarse reticular densities are noted in both lungs predominantly in the lower lobes. This is consistent with pulmonary fibrosis or other chronic interstitial lung disease. No significant changes noted compared to prior exam. No large pleural effusion is noted. The visualized skeletal structures are unremarkable. IMPRESSION: Stable bilateral interstitial lung opacities are noted consistent with pulmonary fibrosis or chronic interstitial lung disease. No significant changes noted compared to prior exam. Electronically Signed   By: Marijo Conception, M.D.   On: 02/03/2018 10:03    EKG: Orders placed or performed during the hospital encounter of 02/03/18  . EKG 12-Lead  . EKG 12-Lead  . ED EKG within 10 minutes  . ED EKG within 10 minutes    IMPRESSION AND PLAN:  68 year old male patient with history of systolic congestive heart failure, COPD, pulmonary fibrosis, mediastinal adenopathy presented to the emergency room for swelling in the legs  and difficulty breathing.  -Acute hypokalemia 10 MDQ IV potassium chloride for 4 doses already ordered in the emergency room Oral potassium 86 M EQ already ordered in the emergency room Follow potassium level closely Magnesium supplementation  -Acute on chronic systolic heart failure exacerbation Diurese patient with IV Lasix Daily input output charts and body weight Cardiology consultation Check echocardiogram [facility] has been sent message via epic haiku  -Pulmonary fibrosis Continue oral steroids  -COPD Continue home dose inhalers  -DVT prophylaxis Resume Xarelto  All the records are reviewed and case discussed with ED provider. Management plans discussed with the patient, family and they are in agreement.  CODE STATUS:Full code Code Status History    Date Active Date Inactive Code Status Order ID Comments User Context   03/04/2017 1231 03/06/2017 1707 Full Code [id number]  Demetrios Loll, MD ED   10/26/2014 1128 10/26/2014 1500 Full Code [id number]  Hubbard Robinson, MD Outpatient       TOTAL TIME TAKING CARE OF THIS PATIENT: 53 minutes.    Saundra Shelling M.D on 02/03/2018 at 11:44 AM  Between 7am to 6pm - Pager - [phone]  After 6pm go to [email or web address] - password [email or web address] [facility]  [facility]  Office  [phone]  CC: Primary care physician; Steele Sizer, MD

## 2018-02-03 NOTE — ED Notes (Signed)
Pt was 100% on RA and HR was stable in the 90s upon ambulation.

## 2018-02-03 NOTE — ED Notes (Signed)
Pt given urinal and urinating at this time

## 2018-02-03 NOTE — ED Notes (Signed)
Pt given graham crackers, peanut butter, and ginger ale.

## 2018-02-03 NOTE — ED Triage Notes (Signed)
Says up all night with racing heart.  Denies chest pain.  Just feels short of breath.

## 2018-02-03 NOTE — ED Notes (Signed)
Dr. Quentin Cornwall notified of potassium of 2.5 at this time.

## 2018-02-03 NOTE — Progress Notes (Signed)
Talked to Dr. Darvin Neighbours about patient's BP at 102/74, patient is to received IV lasix and entresto. Order to hold enteresto and ok to give IV lasix. Patient is asymptomatic. RN will continue to monitor.

## 2018-02-03 NOTE — ED Notes (Signed)
Pt reports he did not take any of his home medications this morning. All home meds given at this time.

## 2018-02-03 NOTE — ED Provider Notes (Addendum)
[facility] Emergency Department Provider Note    First MD Initiated Contact with Patient 02/03/18 [phone]     (approximate)  I have reviewed the triage vital signs and the nursing notes.   HISTORY  Chief Complaint Palpitations    HPI Jason Fitzgerald is a 68 y.o. male below listed past medical history presents with palpitations and shortness of breath or past several days.  Denies any fevers.  States he is coughing up clear phlegm.  Denies any chest pain.  Does feel lightheaded.  Does not smoke.  Does not require home oxygen.  Denies any pain.  States his been compliant with his medications.  Does endorse worsening lower extremity swelling but states he is overall had weight loss over the past several weeks.  His primary concern is the palpitations and shortness of breath.    Past Medical History:  Diagnosis Date  . CHF (congestive heart failure) ([place])   . Chronic cough   . COPD (chronic obstructive pulmonary disease) ([place])   . Elevated liver function tests   . Fibrosis, pulmonary, interstitial, diffuse ([place])   . GERD (gastroesophageal reflux disease)   . History of cocaine abuse ([place])   . Mediastinal lymphadenopathy   . Pulmonary fibrosis ([place])   . Right inguinal hernia    Family History  Problem Relation Age of Onset  . Diabetes Mother   . Cancer Father   . AAA (abdominal aortic aneurysm) Brother    Past Surgical History:  Procedure Laterality Date  . HERNIA REPAIR Right 1973   open  . INGUINAL HERNIA REPAIR Right 11/08/2014   Procedure: LAPAROSCOPIC RIGHT INGUINAL HERNIA REPAIR;  Surgeon: Hubbard Rivka Baune, MD;  Location: [facility] ORS;  Service: General;  Laterality: Right;  . knee arthroscopy Right 1972  . RIGHT/LEFT HEART CATH AND CORONARY ANGIOGRAPHY Right 03/05/2017   Procedure: RIGHT/LEFT HEART CATH AND CORONARY ANGIOGRAPHY;  Surgeon: Dionisio David, MD;  Location: [facility];  Service: Cardiovascular;  Laterality: Right;   Patient  Active Problem List   Diagnosis Date Noted  . Alcoholism ([place]) 12/08/2017  . Rheumatoid arthritis involving multiple sites with positive rheumatoid factor ([place]) 10/13/2017  . Chronic systolic heart failure ([place]) 03/12/2017  . Atrial fibrillation ([place]) 03/12/2017  . Pulmonary HTN ([place]) 03/12/2017  . Arthritis of left knee 08/01/2016  . 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 Chronic cough 05/29/2015  . Chronic obstructive pulmonary disease ([place]) 11/20/2014  . CAFL (chronic airflow limitation) ([place]) 10/24/2014  . Arteriosclerosis of coronary artery 10/24/2014  . History of fundoplication 84/13/2440  . HLD (hyperlipidemia) 10/24/2014  . BP (high blood pressure) 10/24/2014  . LAD (lymphadenopathy), mediastinal 07/13/2013  . ILD (interstitial lung disease) ([place]) 06/19/2013  . Interstitial lung disease ([place]) 06/19/2013  . 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 Authorizing Provider  albuterol (PROVENTIL HFA) 108 (90 Base) MCG/ACT inhaler Inhale 2 puffs into the lungs as needed. 03/24/16 12/14/17  [provider]  amiodarone (PACERONE) 200 MG tablet Take 200 mg by mouth daily.    [provider]  budesonide-formoterol (SYMBICORT) 160-4.5 MCG/ACT inhaler Inhale 2 puffs into the lungs as needed.    [provider]  carvedilol (COREG) 6.25 MG tablet Take 3.125 mg by mouth 2 (two) times daily with a meal.    [provider]  digoxin (LANOXIN) 0.125 MG tablet Take 1 tablet (0.125 mg total)  by mouth daily. 03/06/17   Epifanio Lesches, MD  furosemide (LASIX) 20 MG tablet Take 1 tablet (20 mg total) by mouth daily. 05/11/17 05/11/18  Alisa Graff, FNP  gabapentin (NEURONTIN) 100 MG capsule Take 3 capsules (300 mg total) by mouth at bedtime. 02/01/18 05/02/18  Hubbard Hartshorn, FNP  hydroxychloroquine (PLAQUENIL) 200 MG tablet Take 2 tablets by mouth daily.  01/23/17   [provider]  leflunomide (ARAVA) 10 MG tablet Take by mouth. 10/13/17   [provider]  metaxalone (SKELAXIN) 800 MG tablet metaxalone 800 mg tablet  TAKE 1 TABLET (800 MG TOTAL) BY MOUTH 2 (TWO) TIMES DAILY AS NEEDED FOR MUSCLE SPASMS.    [provider]  pantoprazole (PROTONIX) 40 MG tablet TAKE 1 TABLET (40 MG TOTAL) BY MOUTH ONCE DAILY. 09/09/15   [provider]  potassium chloride SA (K-DUR,KLOR-CON) 20 MEQ tablet Take 1 tablet (20 mEq total) by mouth daily. 12/09/17   Steele Sizer, MD  predniSONE (DELTASONE) 5 MG tablet Take 1 tablet by mouth daily. 01/22/17   [provider]  rivaroxaban (XARELTO) 20 MG TABS tablet Take 1 tablet (20 mg total) by mouth daily with supper. 05/11/17   Alisa Graff, FNP  sacubitril-valsartan (ENTRESTO) 49-51 MG Take 1 tablet by mouth 2 (two) times daily.    [provider]  spironolactone (ALDACTONE) 25 MG tablet Take 0.5 tablets (12.5 mg total) by mouth daily. 05/11/17 05/11/18  Alisa Graff, FNP  thiamine (VITAMIN B-1) 100 MG tablet Take 1 tablet (100 mg total) by mouth daily. 12/23/17   Steele Sizer, MD  traMADol (ULTRAM) 50 MG tablet tramadol 50 mg tablet  TAKE 1 TABLET BY MOUTH EVERY 6 HOURS AS NEEDED FOR UP TO 10 DAYS    [provider]    Allergies Patient has no known allergies.    Social History Social History   Tobacco Use  . Smoking status: Never Smoker  . Smokeless tobacco: Never Used  Substance Use Topics  . Alcohol use: Not Currently    Alcohol/week: 0.0 standard drinks    Comment: 2 quarts a week  . Drug use: Not Currently    Types: Cocaine    Comment: as an young adult     Review of Systems Patient denies headaches, rhinorrhea, blurry vision, numbness, shortness of breath, chest pain, edema, cough, abdominal pain, nausea, vomiting, diarrhea, dysuria, fevers, rashes or hallucinations unless otherwise stated above in  HPI. ____________________________________________   PHYSICAL EXAM:  VITAL SIGNS: Vitals:   02/03/18 1031 02/03/18 1045  BP:  108/72  Pulse: 97 95  Resp: (!) 21 (!) 23  Temp:    SpO2: 93% 97%    Constitutional: Alert and oriented.  Eyes: Conjunctivae are normal.  Head: Atraumatic. Nose: No congestion/rhinnorhea. Mouth/Throat: Mucous membranes are moist.   Neck: No stridor. Painless ROM.  Cardiovascular: irreguarly irregular rhythm. Grossly normal heart sounds.  Good peripheral circulation. Respiratory: Normal respiratory effort.  No retractions. With inspiratory crackles evan in anterior lung fields Gastrointestinal: Soft and nontender. No distention. No abdominal bruits. No CVA tenderness. Genitourinary:  Musculoskeletal: No lower extremity tenderness nor edema.  No joint effusions. Neurologic:  Normal speech and language. No gross focal neurologic deficits are appreciated. No facial droop Skin:  Skin is warm, dry and intact. No rash noted. Psychiatric: Mood and affect are normal. Speech and behavior are normal.  ____________________________________________   LABS (all labs ordered are listed, but only abnormal results are displayed)  Results for orders placed or  performed during the hospital encounter of 02/03/18 (from the past 24 hour(s))  Basic metabolic panel     Status: Abnormal   Collection Time: 02/03/18 10:00 AM  Result Value Ref Range   Sodium 139 135 - 145 mmol/L   Potassium 2.5 (LL) 3.5 - 5.1 mmol/L   Chloride 99 98 - 111 mmol/L   CO2 31 22 - 32 mmol/L   Glucose, Bld 113 (H) 70 - 99 mg/dL   BUN 11 8 - 23 mg/dL   Creatinine, Ser 0.68 0.61 - 1.24 mg/dL   Calcium 8.4 (L) 8.9 - 10.3 mg/dL   GFR calc non Af Amer >60 >60 mL/min   GFR calc Af Amer >60 >60 mL/min   Anion gap 9 5 - 15  CBC     Status: Abnormal   Collection Time: 02/03/18 10:00 AM  Result Value Ref Range   WBC 5.4 4.0 - 10.5 K/uL   RBC 4.02 (L) 4.22 - 5.81 MIL/uL   Hemoglobin 10.5 (L) 13.0 -  17.0 g/dL   HCT 32.8 (L) 39.0 - 52.0 %   MCV 81.6 80.0 - 100.0 fL   MCH 26.1 26.0 - 34.0 pg   MCHC 32.0 30.0 - 36.0 g/dL   RDW 15.5 11.5 - 15.5 %   Platelets 367 150 - 400 K/uL   nRBC 0.0 0.0 - 0.2 %  Troponin I - ONCE - STAT     Status: None   Collection Time: 02/03/18 10:00 AM  Result Value Ref Range   Troponin I <0.03 <0.03 ng/mL  Brain natriuretic peptide     Status: Abnormal   Collection Time: 02/03/18 10:00 AM  Result Value Ref Range   B Natriuretic Peptide 869.0 (H) 0.0 - 100.0 pg/mL  Magnesium     Status: None   Collection Time: 02/03/18 10:00 AM  Result Value Ref Range   Magnesium 2.0 1.7 - 2.4 mg/dL   ____________________________________________  EKG My review and personal interpretation at Time: 9:35   Indication: palpitations  Rate: 105  Rhythm: afib with ashermans phenomena Axis: left Other: otherwise normal intervals, no stemi ____________________________________________  RADIOLOGY  I personally reviewed all radiographic images ordered to evaluate for the above acute complaints and reviewed radiology reports and findings.  These findings were personally discussed with the patient.  Please see medical record for radiology report.  ____________________________________________   PROCEDURES  Procedure(s) performed:  .Critical Care Performed by: Merlyn Lot, MD Authorized by: Merlyn Lot, MD   Critical care provider statement:    Critical care time (minutes):  30   Critical care time was exclusive of:  Separately billable procedures and treating other patients   Critical care was necessary to treat or prevent imminent or life-threatening deterioration of the following conditions:  Metabolic crisis   Critical care was time spent personally by me on the following activities:  Development of treatment plan with patient or surrogate, discussions with consultants, evaluation of patient's response to treatment, examination of patient, obtaining history  from patient or surrogate, ordering and performing treatments and interventions, ordering and review of laboratory studies, ordering and review of radiographic studies, pulse oximetry, re-evaluation of patient's condition and review of old charts      Critical Care performed: yes ____________________________________________   INITIAL IMPRESSION / ASSESSMENT AND PLAN / ED COURSE  Pertinent labs & imaging results that were available during my care of the patient were reviewed by me and considered in my medical decision making (see chart for details).   DDX:  Dysrhythmia, pulmonary fibrosis, congestive heart failure, pneumonia, PE, electrolyte abnormality, dehydration  RASHED EDLER is a 68 y.o. who presents to the ED with generalized weakness palpitations shortness of breath as described above.  He is afebrile but with A. fib intermittently with RVR but overall he is rate controlled.  Blood work does show evidence of acute hypokalemia which I do think is contributing majority of his symptoms.  Does have extensive cardiopulmonary disease and given his history of CHF as well as pulmonary fibrosis requiring nebulizer treatments I do believe patient will require admission the hospital for electrolyte replacement.  I have ordered oral and IV potassium as well as IV magnesium.  Clinical Course as of Feb 04 1127  Thu Feb 03, 2018  1046 CO2: 31 [PR]    Clinical Course User Index [PR] Merlyn Lot, MD     As part of my medical decision making, I reviewed the following data within the [place] notes reviewed and incorporated, Labs reviewed, notes from prior ED visits and [place] Controlled Substance Database   ____________________________________________   FINAL CLINICAL IMPRESSION(S) / ED DIAGNOSES  Final diagnoses:  Palpitations  Hypokalemia      NEW MEDICATIONS STARTED DURING THIS VISIT:  New Prescriptions   No medications on file     Note:  This  document was prepared using Dragon voice recognition software and may include unintentional dictation errors.    Merlyn Lot, MD 02/03/18 1129    Merlyn Lot, MD 02/11/18 [phone]

## 2018-02-03 NOTE — ED Notes (Signed)
Pt c/o that potassium is burning. MD informed.

## 2018-02-03 NOTE — Progress Notes (Signed)
Advanced care plan.  Purpose of the Encounter: CODE STATUS  Parties in Attendance: Patient and family  Patient's Decision Capacity: Good  Subjective/Patient's story: Presented to the emergency room for shortness of breath and swelling in the legs   Objective/Medical story Patient has pulmonary fibrosis and chronic congestive heart failure Appears in fluid overload and congestive heart failure exacerbation Noncompliant with medications Needs IV Lasix for diuresis Patient also has low potassium which has to be aggressively replaced   Goals of care determination:  Advance care directives goals of care and treatment plan discussed Patient for now wants everything done which includes CPR, intubation ventilator if the need arises  Full CODE STATUS: Full code   Time spent discussing advanced care planning: 16 minutes

## 2018-02-04 ENCOUNTER — Inpatient Hospital Stay
Admit: 2018-02-04 | Discharge: 2018-02-04 | Disposition: A | Discharge status: Home / Self Care | Payer: Medicare Other | Attending: Internal Medicine | Admitting: Internal Medicine

## 2018-02-04 DIAGNOSIS — I361 Nonrheumatic tricuspid (valve) insufficiency: Secondary | ICD-10-CM

## 2018-02-04 DIAGNOSIS — E440 Moderate protein-calorie malnutrition: Secondary | ICD-10-CM

## 2018-02-04 DIAGNOSIS — I370 Nonrheumatic pulmonary valve stenosis: Secondary | ICD-10-CM

## 2018-02-04 DIAGNOSIS — I340 Nonrheumatic mitral (valve) insufficiency: Secondary | ICD-10-CM

## 2018-02-04 LAB — BASIC METABOLIC PANEL
Anion gap: 6 (ref 5–15)
Anion gap: 8 (ref 5–15)
BUN: 10 mg/dL (ref 8–23)
BUN: 13 mg/dL (ref 8–23)
CHLORIDE: 99 mmol/L (ref 98–111)
CO2: 31 mmol/L (ref 22–32)
CO2: 31 mmol/L (ref 22–32)
Calcium: 7.8 mg/dL — ABNORMAL LOW (ref 8.9–10.3)
Calcium: 8.1 mg/dL — ABNORMAL LOW (ref 8.9–10.3)
Chloride: 101 mmol/L (ref 98–111)
Creatinine, Ser: 0.67 mg/dL (ref 0.61–1.24)
Creatinine, Ser: 1.03 mg/dL (ref 0.61–1.24)
GFR calc Af Amer: >60 mL/min (ref >60)
GFR calc Af Amer: >60 mL/min (ref >60)
GFR calc non Af Amer: >60 mL/min (ref >60)
GFR calc non Af Amer: >60 mL/min (ref >60)
Glucose, Bld: 114 mg/dL — ABNORMAL HIGH (ref 70–99)
Glucose, Bld: 125 mg/dL — ABNORMAL HIGH (ref 70–99)
Potassium: 2.7 mmol/L — CL (ref 3.5–5.1)
Potassium: 3.6 mmol/L (ref 3.5–5.1)
Sodium: 138 mmol/L (ref 135–145)
Sodium: 138 mmol/L (ref 135–145)

## 2018-02-04 LAB — CBC
HEMATOCRIT: 33 % — AB (ref 39.0–52.0)
Hemoglobin: 10.7 g/dL — ABNORMAL LOW (ref 13.0–17.0)
MCH: 25.9 pg — ABNORMAL LOW (ref 26.0–34.0)
MCHC: 32.4 g/dL (ref 30.0–36.0)
MCV: 79.9 fL — AB (ref 80.0–100.0)
Platelets: 377 10*3/uL (ref 150–400)
RBC: 4.13 MIL/uL — ABNORMAL LOW (ref 4.22–5.81)
RDW: 15.5 % (ref 11.5–15.5)
WBC: 6.8 10*3/uL (ref 4.0–10.5)
nRBC: 0 % (ref 0.0–0.2)

## 2018-02-04 LAB — MAGNESIUM
Magnesium: 2 mg/dL (ref 1.7–2.4)
Magnesium: 1.9 mg/dL (ref 1.7–2.4)

## 2018-02-04 LAB — ECHOCARDIOGRAM COMPLETE
Height: 69 in
Weight: 3024 oz

## 2018-02-04 LAB — TROPONIN I: Troponin I: <0.03 ng/mL (ref <0.03)

## 2018-02-04 MED ORDER — POTASSIUM CHLORIDE CRYS ER 20 MEQ PO TBCR: 40.0000 meq | EXTENDED_RELEASE_TABLET | Freq: Three times a day (TID) | ORAL | Status: DC

## 2018-02-04 MED ORDER — ENSURE ENLIVE PO LIQD
237.0000 mL | Freq: Two times a day (BID) | ORAL | Status: DC
  Administered 2018-02-04: 237 mL via ORAL

## 2018-02-04 MED ORDER — POTASSIUM CHLORIDE CRYS ER 20 MEQ PO TBCR
40.0000 meq | EXTENDED_RELEASE_TABLET | Freq: Three times a day (TID) | ORAL | Status: DC
  Administered 2018-02-04 – 2018-02-05 (×4): 40 meq via ORAL
  Filled 2018-02-04: qty 2
  Filled 2018-02-04: qty 4
  Filled 2018-02-04 (×2): qty 2

## 2018-02-04 MED ORDER — OCUVITE-LUTEIN PO CAPS
1.0000 | ORAL_CAPSULE | Freq: Every day | ORAL | Status: DC
  Administered 2018-02-04 – 2018-02-05 (×2): 1 via ORAL
  Filled 2018-02-04 (×2): qty 1

## 2018-02-04 NOTE — Progress Notes (Signed)
Patient AM potassium 2.7. MD Prasanna made aware. MD to place orders to increase PO  potassium to TID.   Will continue to monitor.   Iran Sizer M

## 2018-02-04 NOTE — Progress Notes (Signed)
Initial Nutrition Assessment  DOCUMENTATION CODES:   Non-severe (moderate) malnutrition in context of chronic illness  INTERVENTION:  Ensure Enlive po BID, each supplement provides 350 kcal and 20 grams of protein MVI    NUTRITION DIAGNOSIS:   Moderate Malnutrition related to chronic illness(COPD, CHF) as evidenced by mild fat depletion, moderate muscle depletion, percent weight loss.   GOAL:   Patient will meet greater than or equal to 90% of their needs    MONITOR:   PO intake, Labs, Supplement acceptance, Weight trends, Skin  REASON FOR ASSESSMENT:   Malnutrition Screening Tool    ASSESSMENT:   68 year old male with known history of CHF, COPD, pulmonary oral steroids, mediastinal adenopathy, cocaine abuse, presented to ED with swelling in BLE and SOB x 1 week and hypokalemia   Patient reports feeling dizzy while lying in bed; RN notified prior to RD visit. Patient stated that dizzy spells started after he retired 6 months ago. He denied n/v/d or recent loss of appetite. Patient reports 1-2 meals/day and lots of snacking (anything sweet) He endorses most of breakfast stating that he ate all of his pancakes and would have eaten more of his eggs, but he did not care for them.  Patient reports that he refrains from fried foods and likes to prepare baked pork chops/chx, pintos, greens, peas, white corn.   Patient recalls UBW of 210-215lbs ~7 months ago and stated that his where he feels comfortable.    Medications reviewed and include: Lasix, Gabapentin, Protonix, KCl 42mEq tablet, Thiamine  Labs: K 2.7 (L) - replacing Ca 7.8 (L) - no recent albumin  Lab Results  Component Value Date   HGBA1C 5.2 12/08/2017      NUTRITION - FOCUSED PHYSICAL EXAM:    Most Recent Value  Orbital Region  Mild depletion  Upper Arm Region  Mild depletion  Thoracic and Lumbar Region  Mild depletion  Buccal Region  Mild depletion  Temple Region  Mild depletion  Clavicle Bone Region   Mild depletion  Clavicle and Acromion Bone Region  No depletion  Scapular Bone Region  No depletion  Dorsal Hand  Mild depletion  Patellar Region  Moderate depletion  Anterior Thigh Region  Mild depletion  Posterior Calf Region  Moderate depletion  Edema (RD Assessment)  Mild [+1,  BLE]  Hair  Reviewed  Eyes  Reviewed  Mouth  Reviewed  Skin  Reviewed  Nails  Reviewed       Diet Order:   Diet Order            Diet Heart Room service appropriate? Yes; Fluid consistency: Thin  Diet effective now              EDUCATION NEEDS:   No education needs have been identified at this time  Skin:  Skin Assessment: Reviewed RN Assessment  Last BM:  02/03/2018  Height:   Ht Readings from Last 1 Encounters:  02/03/18 5\' 9"  (1.753 m)    Weight:   Wt Readings from Last 1 Encounters:  02/03/18 85.7 kg    Ideal Body Weight:  72.7 kg  BMI:  Body mass index is 27.91 kg/m.  Estimated Nutritional Needs:   Kcal:  2121-2285 (MSJ 1.3-1.4)  Protein:  103-120g (1.2-1.4g/kg)  Fluid:  </=2L or per MD    Lajuan Lines, RD, LDN  After Hours/Weekend Pager: [phone]

## 2018-02-04 NOTE — Progress Notes (Signed)
Per CCMD pt has had x2 6 beat runs of v. Tach on tele/ pt asymptomatic/ Dr. Estanislado Pandy made aware/ will continue to monitor.

## 2018-02-04 NOTE — Progress Notes (Signed)
*  PRELIMINARY RESULTS* Echocardiogram 2D Echocardiogram has been performed.  Jason Fitzgerald Jason Fitzgerald 02/04/2018, 8:07 AM

## 2018-02-04 NOTE — Plan of Care (Signed)
  Problem: Clinical Measurements: Goal: Respiratory complications will improve Outcome: Progressing   Problem: Skin Integrity: Goal: Risk for impaired skin integrity will decrease Outcome: Progressing   Problem: Activity: Goal: Capacity to carry out activities will improve Outcome: Progressing   Problem: Cardiac: Goal: Ability to achieve and maintain adequate cardiopulmonary perfusion will improve Outcome: Progressing

## 2018-02-04 NOTE — Progress Notes (Signed)
CCMD called to report 30 beats of v tach / RN to pts bedside/ pt asymptomatic/ Dr. Humphrey Rolls made aware/  Orders to obtain labs and continue to monitor

## 2018-02-04 NOTE — Progress Notes (Signed)
Dr. Estanislado Pandy made aware of low bp 85/58- pt asymptomatic / orders to hold pm dose of coreg/ will monitor.

## 2018-02-04 NOTE — Consult Note (Signed)
Jason Fitzgerald is a 68 y.o. male  [id number]  Primary Cardiologist: Neoma Laming Reason for Consultation: CHF  HPI: This is a 68 year old African-American male with history of congestive heart failure and LV dysfunction presented to the hospital with shortness of breath orthopnea PND and leg swelling.  Patient says that he was having some side effects of medication and stopped taking them.   Review of Systems: Patient does have orthopnea PND and leg swelling but no chest pain   Past Medical History:  Diagnosis Date  . CHF (congestive heart failure) ([place])   . Chronic cough   . COPD (chronic obstructive pulmonary disease) ([place])   . Elevated liver function tests   . Fibrosis, pulmonary, interstitial, diffuse ([place])   . GERD (gastroesophageal reflux disease)   . History of cocaine abuse ([place])   . Mediastinal lymphadenopathy   . Pulmonary fibrosis ([place])   . Right inguinal hernia     Medications Prior to Admission  Medication Sig Dispense Refill  . amiodarone (PACERONE) 200 MG tablet Take 200 mg by mouth daily.    . budesonide-formoterol (SYMBICORT) 160-4.5 MCG/ACT inhaler Inhale 2 puffs into the lungs as needed.    . carvedilol (COREG) 6.25 MG tablet Take 3.125 mg by mouth 2 (two) times daily with a meal.    . gabapentin (NEURONTIN) 100 MG capsule Take 3 capsules (300 mg total) by mouth at bedtime. 270 capsule 0  . hydroxychloroquine (PLAQUENIL) 200 MG tablet Take 2 tablets by mouth daily.  1  . leflunomide (ARAVA) 10 MG tablet Take by mouth.    . pantoprazole (PROTONIX) 40 MG tablet TAKE 1 TABLET (40 MG TOTAL) BY MOUTH ONCE DAILY.    Marland Kitchen predniSONE (DELTASONE) 5 MG tablet Take 1 tablet by mouth daily.    . rivaroxaban (XARELTO) 20 MG TABS tablet Take 1 tablet (20 mg total) by mouth daily with supper. 90 tablet 3  . sacubitril-valsartan (ENTRESTO) 49-51 MG Take 1 tablet by mouth 2 (two) times daily.    Marland Kitchen spironolactone (ALDACTONE) 25 MG tablet Take 0.5 tablets (12.5 mg total) by  mouth daily. 45 tablet 3  . thiamine (VITAMIN B-1) 100 MG tablet Take 1 tablet (100 mg total) by mouth daily. 30 tablet 1  . traMADol (ULTRAM) 50 MG tablet tramadol 50 mg tablet  TAKE 1 TABLET BY MOUTH EVERY 6 HOURS AS NEEDED FOR UP TO 10 DAYS    . albuterol (PROVENTIL HFA) 108 (90 Base) MCG/ACT inhaler Inhale 2 puffs into the lungs as needed.    . digoxin (LANOXIN) 0.125 MG tablet Take 1 tablet (0.125 mg total) by mouth daily. (Patient not taking: Reported on 02/03/2018) 30 tablet 0  . furosemide (LASIX) 20 MG tablet Take 1 tablet (20 mg total) by mouth daily. (Patient not taking: Reported on 02/03/2018) 90 tablet 3  . metaxalone (SKELAXIN) 800 MG tablet metaxalone 800 mg tablet  TAKE 1 TABLET (800 MG TOTAL) BY MOUTH 2 (TWO) TIMES DAILY AS NEEDED FOR MUSCLE SPASMS.    Marland Kitchen potassium chloride SA (K-DUR,KLOR-CON) 20 MEQ tablet Take 1 tablet (20 mEq total) by mouth daily. (Patient not taking: Reported on 02/03/2018) 30 tablet 3     . amiodarone  200 mg Oral Daily  . carvedilol  3.125 mg Oral BID WC  . digoxin  0.125 mg Oral Daily  . feeding supplement (ENSURE ENLIVE)  237 mL Oral BID BM  . furosemide  40 mg Intravenous Q12H  . gabapentin  300 mg Oral QHS  .  hydroxychloroquine  400 mg Oral Daily  . leflunomide  10 mg Oral Daily  . metaxalone  800 mg Oral TID  . mometasone-formoterol  2 puff Inhalation BID  . multivitamin-lutein  1 capsule Oral Daily  . pantoprazole  40 mg Oral Daily  . potassium chloride  40 mEq Oral TID  . predniSONE  5 mg Oral Daily  . rivaroxaban  20 mg Oral Q supper  . sacubitril-valsartan  1 tablet Oral BID  . sodium chloride flush  3 mL Intravenous Q12H  . spironolactone  12.5 mg Oral Daily  . thiamine  100 mg Oral Daily    Infusions: . sodium chloride      No Known Allergies  Social History   Socioeconomic History  . Marital status: Divorced    Spouse name: Not on file  . Number of children: 4  . Years of education: Not on file  . Highest education level:  Not on file  Occupational History  . Occupation: retired   Scientific laboratory technician  . Financial resource strain: Not hard at all  . Food insecurity:    Worry: Never true    Inability: Never true  . Transportation needs:    Medical: No    Non-medical: No  Tobacco Use  . Smoking status: Never Smoker  . Smokeless tobacco: Never Used  Substance and Sexual Activity  . Alcohol use: Not Currently    Alcohol/week: 0.0 standard drinks    Comment: 2 quarts a week  . Drug use: Not Currently    Types: Cocaine    Comment: as an young adult   . Sexual activity: Not Currently  Lifestyle  . Physical activity:    Days per week: 0 days    Minutes per session: 0 min  . Stress: Not on file  Relationships  . Social connections:    Talks on phone: More than three times a week    Gets together: More than three times a week    Attends religious service: Never    Active member of club or organization: No    Attends meetings of clubs or organizations: Never    Relationship status: Divorced  . Intimate partner violence:    Fear of current or ex partner: No    Emotionally abused: No    Physically abused: No    Forced sexual activity: No  Other Topics Concern  . Not on file  Social History Narrative   Retired Summer of 2019   Lives alone    Family History  Problem Relation Age of Onset  . Diabetes Mother   . Cancer Father   . AAA (abdominal aortic aneurysm) Brother     PHYSICAL EXAM: Vitals:   02/04/18 0335 02/04/18 0810  BP: 97/67 108/60  Pulse: 80 94  Resp: (!) 24 (!) 30  Temp: 98.6 F (37 C) 98 F (36.7 C)  SpO2: 91% 95%     Intake/Output Summary (Last 24 hours) at 02/04/2018 1423 Last data filed at 02/04/2018 1415 Gross per 24 hour  Intake -  Output 3315 ml  Net -3315 ml    General:  Well appearing. No respiratory difficulty HEENT: normal Neck: supple. no JVD. Carotids 2+ bilat; no bruits. No lymphadenopathy or thryomegaly appreciated. Cor: PMI nondisplaced. Regular rate &  rhythm. No rubs, gallops or murmurs. Lungs: clear Abdomen: soft, nontender, nondistended. No hepatosplenomegaly. No bruits or masses. Good bowel sounds. Extremities: no cyanosis, clubbing, rash, edema Neuro: alert & oriented x 3, cranial nerves grossly intact.  moves all 4 extremities w/o difficulty. Affect pleasant.  ECG: Sinus tachycardia with frequent PVCs and atrial premature contractions  Results for orders placed or performed during the hospital encounter of 02/03/18 (from the past 24 hour(s))  Troponin I - Now Then Q6H     Status: None   Collection Time: 02/03/18  4:09 PM  Result Value Ref Range   Troponin I <0.03 <0.03 ng/mL  Potassium     Status: Abnormal   Collection Time: 02/03/18  8:08 PM  Result Value Ref Range   Potassium 3.1 (L) 3.5 - 5.1 mmol/L  Troponin I - Now Then Q6H     Status: None   Collection Time: 02/03/18  8:08 PM  Result Value Ref Range   Troponin I <0.03 <0.03 ng/mL  Troponin I - Now Then Q6H     Status: None   Collection Time: 02/04/18  3:41 AM  Result Value Ref Range   Troponin I <0.03 <0.03 ng/mL  Basic metabolic panel     Status: Abnormal   Collection Time: 02/04/18  3:41 AM  Result Value Ref Range   Sodium 138 135 - 145 mmol/L   Potassium 2.7 (LL) 3.5 - 5.1 mmol/L   Chloride 101 98 - 111 mmol/L   CO2 31 22 - 32 mmol/L   Glucose, Bld 114 (H) 70 - 99 mg/dL   BUN 10 8 - 23 mg/dL   Creatinine, Ser 0.67 0.61 - 1.24 mg/dL   Calcium 7.8 (L) 8.9 - 10.3 mg/dL   GFR calc non Af Amer >60 >60 mL/min   GFR calc Af Amer >60 >60 mL/min   Anion gap 6 5 - 15  CBC     Status: Abnormal   Collection Time: 02/04/18  3:41 AM  Result Value Ref Range   WBC 6.8 4.0 - 10.5 K/uL   RBC 4.13 (L) 4.22 - 5.81 MIL/uL   Hemoglobin 10.7 (L) 13.0 - 17.0 g/dL   HCT 33.0 (L) 39.0 - 52.0 %   MCV 79.9 (L) 80.0 - 100.0 fL   MCH 25.9 (L) 26.0 - 34.0 pg   MCHC 32.4 30.0 - 36.0 g/dL   RDW 15.5 11.5 - 15.5 %   Platelets 377 150 - 400 K/uL   nRBC 0.0 0.0 - 0.2 %  Magnesium      Status: None   Collection Time: 02/04/18  3:41 AM  Result Value Ref Range   Magnesium 2.0 1.7 - 2.4 mg/dL   Dg Chest 2 View  Result Date: 02/03/2018 CLINICAL DATA:  Cardiac palpitations. EXAM: CHEST - 2 VIEW COMPARISON:  Radiographs of March 04, 2017. CT scan of April 07, 2017. FINDINGS: The heart size and mediastinal contours are within normal limits. No pneumothorax is noted. Stable coarse reticular densities are noted in both lungs predominantly in the lower lobes. This is consistent with pulmonary fibrosis or other chronic interstitial lung disease. No significant changes noted compared to prior exam. No large pleural effusion is noted. The visualized skeletal structures are unremarkable. IMPRESSION: Stable bilateral interstitial lung opacities are noted consistent with pulmonary fibrosis or chronic interstitial lung disease. No significant changes noted compared to prior exam. Electronically Signed   By: Marijo Conception, M.D.   On: 02/03/2018 10:03     ASSESSMENT AND PLAN: Congestive heart failure with ejection fraction 35% and normal coronaries in February last year.  Advise starting the patient on Entresto and carvedilol and Aldactone.  Patient can be discharged since he is feeling better with follow-up in  the office next Tuesday at [facility]

## 2018-02-04 NOTE — Progress Notes (Signed)
[place] [facility] [place] NAME: Jason Fitzgerald    MR#:  [id number]  DATE OF BIRTH:  Oct 09, 1950  SUBJECTIVE:  CHIEF COMPLAINT:   Chief Complaint  Patient presents with  . Palpitations  Seen today Decreased shortness of breath Swelling in the legs better No complaints of chest pain  REVIEW OF SYSTEMS:    ROS  CONSTITUTIONAL: No documented fever. No fatigue, weakness. No weight gain, no weight loss.  EYES: No blurry or double vision.  ENT: No tinnitus. No postnasal drip. No redness of the oropharynx.  RESPIRATORY: No cough, no wheeze, no hemoptysis. mild dyspnea.  CARDIOVASCULAR: No chest pain. No orthopnea. No palpitations. No syncope.  GASTROINTESTINAL: No nausea, no vomiting or diarrhea. No abdominal pain. No melena or hematochezia.  GENITOURINARY: No dysuria or hematuria.  ENDOCRINE: No polyuria or nocturia. No heat or cold intolerance.  HEMATOLOGY: No anemia. No bruising. No bleeding.  INTEGUMENTARY: No rashes. No lesions.  MUSCULOSKELETAL: No arthritis. No swelling. No gout.  NEUROLOGIC: No numbness, tingling, or ataxia. No seizure-type activity.  PSYCHIATRIC: No anxiety. No insomnia. No ADD.   DRUG ALLERGIES:  No Known Allergies  VITALS:  Blood pressure 108/60, pulse 94, temperature 98 F (36.7 C), temperature source Oral, resp. rate (!) 30, height 5\' 9"  (1.753 m), weight 85.7 kg, SpO2 95 %.  PHYSICAL EXAMINATION:   Physical Exam  GENERAL:  68 y.o.-year-old patient lying in the bed with no acute distress.  EYES: Pupils equal, round, reactive to light and accommodation. No scleral icterus. Extraocular muscles intact.  HEENT: Head atraumatic, normocephalic. Oropharynx and nasopharynx clear.  NECK:  Supple, no jugular venous distention. No thyroid enlargement, no tenderness.  LUNGS: Improved breath sounds bilaterally,bibasilar crepitations decreased. No use of accessory muscles of respiration.  CARDIOVASCULAR: S1, S2 normal. No  murmurs, rubs, or gallops.  ABDOMEN: Soft, nontender, nondistended. Bowel sounds present. No organomegaly or mass.  EXTREMITIES: No cyanosis, clubbing  Decreased pedal edema  NEUROLOGIC: Cranial nerves II through XII are intact. No focal Motor or sensory deficits b/l.   PSYCHIATRIC: The patient is alert and oriented x 3.  SKIN: No obvious rash, lesion, or ulcer.   LABORATORY PANEL:   CBC Recent Labs  Lab 02/04/18 0341  WBC 6.8  HGB 10.7*  HCT 33.0*  PLT 377   ------------------------------------------------------------------------------------------------------------------ Chemistries  Recent Labs  Lab 02/04/18 0341  NA 138  K 2.7*  CL 101  CO2 31  GLUCOSE 114*  BUN 10  CREATININE 0.67  CALCIUM 7.8*  MG 2.0   ------------------------------------------------------------------------------------------------------------------  Cardiac Enzymes Recent Labs  Lab 02/04/18 0341  TROPONINI <0.03   ------------------------------------------------------------------------------------------------------------------  RADIOLOGY:  Dg Chest 2 View  Result Date: 02/03/2018 CLINICAL DATA:  Cardiac palpitations. EXAM: CHEST - 2 VIEW COMPARISON:  Radiographs of March 04, 2017. CT scan of April 07, 2017. FINDINGS: The heart size and mediastinal contours are within normal limits. No pneumothorax is noted. Stable coarse reticular densities are noted in both lungs predominantly in the lower lobes. This is consistent with pulmonary fibrosis or other chronic interstitial lung disease. No significant changes noted compared to prior exam. No large pleural effusion is noted. The visualized skeletal structures are unremarkable. IMPRESSION: Stable bilateral interstitial lung opacities are noted consistent with pulmonary fibrosis or chronic interstitial lung disease. No significant changes noted compared to prior exam. Electronically Signed   By: Marijo Conception, M.D.   On: 02/03/2018 10:03      ASSESSMENT AND PLAN:  68 year old male  patient with history of systolic congestive heart failure, COPD, pulmonary fibrosis, mediastinal adenopathy currently under hospitalist service for swelling in the legs and difficulty breathing.  -Acute hypokalemia Replace potassium orally 40 M EQ 3 times a day Follow potassium level closely Magnesium supplementation is completed and magnesium level is normal  -Acute on chronic systolic heart failure exacerbation Status post cardiology evaluation Diurese patient with IV Lasix Daily input output charts and body weight Echocardiogram reviewed EF 35% Patient started on Entresto and Aldactone and coreg Discharge patient in a.m. once potassium is normalized  -Pulmonary fibrosis Continue oral steroids Outpatient pulmonary follow-up  -COPD Continue home dose inhalers  -DVT prophylaxis Xarelto on board   All the records are reviewed and case discussed with Care Management/Social Worker. Management plans discussed with the patient, family and they are in agreement.  CODE STATUS: Full code  DVT Prophylaxis: SCDs  TOTAL TIME TAKING CARE OF THIS PATIENT: 35 minutes.   POSSIBLE D/C IN 1 DAYS, DEPENDING ON CLINICAL CONDITION.  Saundra Shelling M.D on 02/04/2018 at 2:47 PM  Between 7am to 6pm - Pager - [phone]  After 6pm go to [email or web address] - password [email or web address] [facility]  Office  [phone]  CC: Primary care physician; Steele Sizer, MD  Note: This dictation was prepared with Dragon dictation along with smaller phrase technology. Any transcriptional errors that result from this process are unintentional.

## 2018-02-05 LAB — BASIC METABOLIC PANEL
Anion gap: 7 (ref 5–15)
BUN: 13 mg/dL (ref 8–23)
CALCIUM: 7.8 mg/dL — AB (ref 8.9–10.3)
CO2: 28 mmol/L (ref 22–32)
CREATININE: 0.77 mg/dL (ref 0.61–1.24)
Chloride: 100 mmol/L (ref 98–111)
GFR calc Af Amer: >60 mL/min (ref >60)
GFR calc non Af Amer: >60 mL/min (ref >60)
GLUCOSE: 103 mg/dL — AB (ref 70–99)
Potassium: 3.6 mmol/L (ref 3.5–5.1)
Sodium: 135 mmol/L (ref 135–145)

## 2018-02-05 MED ORDER — GUAIFENESIN-DM 100-10 MG/5ML PO SYRP: 5.0000 mL | ORAL_SOLUTION | ORAL | Status: DC | PRN

## 2018-02-05 MED ORDER — POTASSIUM CHLORIDE CRYS ER 20 MEQ PO TBCR: 20.0000 meq | EXTENDED_RELEASE_TABLET | Freq: Every day | ORAL | 1 refills | Status: DC

## 2018-02-05 MED ORDER — ALBUTEROL SULFATE HFA 108 (90 BASE) MCG/ACT IN AERS: 2.0000 | INHALATION_SPRAY | RESPIRATORY_TRACT | 2 refills | Status: DC | PRN

## 2018-02-05 MED ORDER — AMIODARONE HCL 200 MG PO TABS
400.0000 mg | ORAL_TABLET | Freq: Every day | ORAL | Status: DC
  Administered 2018-02-05: 400 mg via ORAL
  Filled 2018-02-05: qty 2

## 2018-02-05 MED ORDER — AMIODARONE HCL 400 MG PO TABS: 400.0000 mg | ORAL_TABLET | Freq: Every day | ORAL | 0 refills | Status: DC

## 2018-02-05 MED ORDER — GUAIFENESIN-DM 100-10 MG/5ML PO SYRP: 5.0000 mL | ORAL_SOLUTION | ORAL | 0 refills | Status: DC | PRN

## 2018-02-05 MED ORDER — METOPROLOL TARTRATE 25 MG PO TABS: 25.0000 mg | ORAL_TABLET | Freq: Two times a day (BID) | ORAL | 1 refills | Status: DC

## 2018-02-05 MED ORDER — FUROSEMIDE 40 MG PO TABS
40.0000 mg | ORAL_TABLET | Freq: Every day | ORAL | Status: DC
  Administered 2018-02-05: 40 mg via ORAL

## 2018-02-05 MED ORDER — METOPROLOL TARTRATE 25 MG PO TABS
25.0000 mg | ORAL_TABLET | Freq: Two times a day (BID) | ORAL | Status: DC
  Administered 2018-02-05: 25 mg via ORAL
  Filled 2018-02-05: qty 1

## 2018-02-05 MED ORDER — FUROSEMIDE 40 MG PO TABS: 40.0000 mg | ORAL_TABLET | Freq: Every day | ORAL | 1 refills | Status: DC

## 2018-02-05 NOTE — Plan of Care (Signed)
  Problem: Clinical Measurements: Goal: Ability to maintain clinical measurements within normal limits will improve Outcome: Progressing Goal: Respiratory complications will improve Outcome: Progressing   Problem: Activity: Goal: Risk for activity intolerance will decrease Outcome: Progressing   

## 2018-02-05 NOTE — Discharge Summary (Signed)
[place] [facility] [place] NAME: Jason Fitzgerald    MR#:  [id number]  DATE OF BIRTH:  12-03-1950  DATE OF ADMISSION:  02/03/2018   ADMITTING PHYSICIAN: Saundra Shelling, MD  DATE OF DISCHARGE: 02/05/2018  PRIMARY CARE PHYSICIAN: Steele Sizer, MD   ADMISSION DIAGNOSIS:  Palpitations [R00.2] Hypokalemia [E87.6] DISCHARGE DIAGNOSIS:  Active Problems:   Hypokalemia   Malnutrition of moderate degree  SECONDARY DIAGNOSIS:   Past Medical History:  Diagnosis Date  . CHF (congestive heart failure) ([place])   . Chronic cough   . COPD (chronic obstructive pulmonary disease) ([place])   . Elevated liver function tests   . Fibrosis, pulmonary, interstitial, diffuse ([place])   . GERD (gastroesophageal reflux disease)   . History of cocaine abuse ([place])   . Mediastinal lymphadenopathy   . Pulmonary fibrosis ([place])   . Right inguinal hernia    HOSPITAL COURSE:  68 year old male patient with history of systolic congestive heart failure, COPD, pulmonary fibrosis, mediastinal adenopathy currently under hospitalist service for swelling in the legs and difficulty breathing.  -Acute hypokalemia Replaced potassium orally 40 M EQ 3 times a day, and improved.  Continue home potassium. Magnesium supplementation is completed and magnesium level is normal.  -Acute on chronic systolic heart failure exacerbation.  Ejection fraction 35% per echo. Status post cardiology evaluation Diurese patient with IV Lasix, changed to p.o. Lasix. Daily input output charts and body weight Echocardiogram reviewed EF 35% Patient started on Entresto and Aldactone and coreg The patient can be discharged to home today per Dr. Humphrey Rolls.  Nonsustained run of ventricular tachycardia  Advise increasing amiodarone to 400 mg p.o. once a day and discontinue carvedilol and changed to metoprolol 25 p.o. twice daily per Dr. Humphrey Rolls.  -Pulmonary fibrosis Continue oral steroids Outpatient pulmonary  follow-up  -COPD Continue home dose inhalers  -DVT prophylaxis Xarelto on board DISCHARGE CONDITIONS:  Stable, discharge to home today. CONSULTS OBTAINED:  Treatment Team:  Dionisio David, MD DRUG ALLERGIES:  No Known Allergies DISCHARGE MEDICATIONS:   Allergies as of 02/05/2018   No Known Allergies     Medication List    STOP taking these medications   carvedilol 6.25 MG tablet Commonly known as:  COREG     TAKE these medications   albuterol 108 (90 Base) MCG/ACT inhaler Commonly known as:  PROVENTIL HFA Inhale 2 puffs into the lungs as needed.   amiodarone 400 MG tablet Commonly known as:  PACERONE Take 1 tablet (400 mg total) by mouth daily. What changed:    medication strength  how much to take   budesonide-formoterol 160-4.5 MCG/ACT inhaler Commonly known as:  SYMBICORT Inhale 2 puffs into the lungs as needed.   digoxin 0.125 MG tablet Commonly known as:  LANOXIN Take 1 tablet (0.125 mg total) by mouth daily.   furosemide 40 MG tablet Commonly known as:  LASIX Take 1 tablet (40 mg total) by mouth daily. What changed:    medication strength  how much to take   gabapentin 100 MG capsule Commonly known as:  NEURONTIN Take 3 capsules (300 mg total) by mouth at bedtime.   guaiFENesin-dextromethorphan 100-10 MG/5ML syrup Commonly known as:  ROBITUSSIN DM Take 5 mLs by mouth every 4 (four) hours as needed for cough.   hydroxychloroquine 200 MG tablet Commonly known as:  PLAQUENIL Take 2 tablets by mouth daily.   leflunomide 10 MG tablet Commonly known as:  ARAVA Take by mouth.   metaxalone 800 MG  tablet Commonly known as:  SKELAXIN metaxalone 800 mg tablet  TAKE 1 TABLET (800 MG TOTAL) BY MOUTH 2 (TWO) TIMES DAILY AS NEEDED FOR MUSCLE SPASMS.   metoprolol tartrate 25 MG tablet Commonly known as:  LOPRESSOR Take 1 tablet (25 mg total) by mouth 2 (two) times daily.   pantoprazole 40 MG tablet Commonly known as:  PROTONIX TAKE 1 TABLET  (40 MG TOTAL) BY MOUTH ONCE DAILY.   potassium chloride SA 20 MEQ tablet Commonly known as:  K-DUR,KLOR-CON Take 1 tablet (20 mEq total) by mouth daily.   predniSONE 5 MG tablet Commonly known as:  DELTASONE Take 1 tablet by mouth daily.   rivaroxaban 20 MG Tabs tablet Commonly known as:  XARELTO Take 1 tablet (20 mg total) by mouth daily with supper.   sacubitril-valsartan 49-51 MG Commonly known as:  ENTRESTO Take 1 tablet by mouth 2 (two) times daily.   spironolactone 25 MG tablet Commonly known as:  ALDACTONE Take 0.5 tablets (12.5 mg total) by mouth daily.   thiamine 100 MG tablet Commonly known as:  VITAMIN B-1 Take 1 tablet (100 mg total) by mouth daily.   traMADol 50 MG tablet Commonly known as:  ULTRAM tramadol 50 mg tablet  TAKE 1 TABLET BY MOUTH EVERY 6 HOURS AS NEEDED FOR UP TO 10 DAYS        DISCHARGE INSTRUCTIONS:  See AVS.  If you experience worsening of your admission symptoms, develop shortness of breath, life threatening emergency, suicidal or homicidal thoughts you must seek medical attention immediately by calling 911 or calling your MD immediately  if symptoms less severe.  You Must read complete instructions/literature along with all the possible adverse reactions/side effects for all the Medicines you take and that have been prescribed to you. Take any new Medicines after you have completely understood and accpet all the possible adverse reactions/side effects.   Please note  You were cared for by a hospitalist during your hospital stay. If you have any questions about your discharge medications or the care you received while you were in the hospital after you are discharged, you can call the unit and asked to speak with the hospitalist on call if the hospitalist that took care of you is not available. Once you are discharged, your primary care physician will handle any further medical issues. Please note that NO REFILLS for any discharge medications  will be authorized once you are discharged, as it is imperative that you return to your primary care physician (or establish a relationship with a primary care physician if you do not have one) for your aftercare needs so that they can reassess your need for medications and monitor your lab values.    On the day of Discharge:  VITAL SIGNS:  Blood pressure 105/68, pulse 79, temperature 98.1 F (36.7 C), temperature source Oral, resp. rate 17, height 5\' 9"  (1.753 m), weight 85.7 kg, SpO2 98 %. PHYSICAL EXAMINATION:  GENERAL:  67 y.o.-year-old patient lying in the bed with no acute distress.  EYES: Pupils equal, round, reactive to light and accommodation. No scleral icterus. Extraocular muscles intact.  HEENT: Head atraumatic, normocephalic. Oropharynx and nasopharynx clear.  NECK:  Supple, no jugular venous distention. No thyroid enlargement, no tenderness.  LUNGS: Normal breath sounds bilaterally, no wheezing, rales,rhonchi or crepitation. No use of accessory muscles of respiration.  CARDIOVASCULAR: S1, S2 normal. No murmurs, rubs, or gallops.  ABDOMEN: Soft, non-tender, non-distended. Bowel sounds present. No organomegaly or mass.  EXTREMITIES: No pedal  edema, cyanosis, or clubbing.  NEUROLOGIC: Cranial nerves II through XII are intact. Muscle strength 5/5 in all extremities. Sensation intact. Gait not checked.  PSYCHIATRIC: The patient is alert and oriented x 3.  SKIN: No obvious rash, lesion, or ulcer.  DATA REVIEW:   CBC Recent Labs  Lab 02/04/18 0341  WBC 6.8  HGB 10.7*  HCT 33.0*  PLT 377    Chemistries  Recent Labs  Lab 02/04/18 1909 02/05/18 0330  NA 138 135  K 3.6 3.6  CL 99 100  CO2 31 28  GLUCOSE 125* 103*  BUN 13 13  CREATININE 1.03 0.77  CALCIUM 8.1* 7.8*  MG 1.9  --      Microbiology Results  No results found for this or any previous visit.  RADIOLOGY:  No results found.   Management plans discussed with the patient, family and they are in  agreement.  CODE STATUS: Full Code   TOTAL TIME TAKING CARE OF THIS PATIENT: 33 minutes.    Demetrios Loll M.D on 02/05/2018 at 1:29 PM  Between 7am to 6pm - Pager - [phone]  After 6pm go to [email or web address] - [employer] [facility]  Office  [phone]  CC: Primary care physician; Steele Sizer, MD   Note: This dictation was prepared with Dragon dictation along with smaller phrase technology. Any transcriptional errors that result from this process are unintentional.

## 2018-02-05 NOTE — Progress Notes (Signed)
RN removed IV and gave telemetry box to the secretary.  Patient left in a private vehicle.  Phillis Knack, RN

## 2018-02-05 NOTE — Progress Notes (Signed)
SUBJECTIVE: Patient is feeling fine   Vitals:   02/04/18 2244 02/05/18 0416 02/05/18 0416 02/05/18 0747  BP: 100/73 91/62 91/62  105/68  Pulse: 85 87 87 79  Resp:  17 17   Temp:  98.6 F (37 C) 98.5 F (36.9 C) 98.1 F (36.7 C)  TempSrc:  Oral Oral Oral  SpO2:  96% 96% 98%  Weight:      Height:        Intake/Output Summary (Last 24 hours) at 02/05/2018 0833 Last data filed at 02/05/2018 0745 Gross per 24 hour  Intake -  Output 1240 ml  Net -1240 ml    LABS: Basic Metabolic Panel: Recent Labs    02/04/18 0341 02/04/18 1909 02/05/18 0330  NA 138 138 135  K 2.7* 3.6 3.6  CL 101 99 100  CO2 31 31 28   GLUCOSE 114* 125* 103*  BUN 10 13 13   CREATININE 0.67 1.03 0.77  CALCIUM 7.8* 8.1* 7.8*  MG 2.0 1.9  --    Liver Function Tests: No results for input(s): AST, ALT, ALKPHOS, BILITOT, PROT, ALBUMIN in the last 72 hours. No results for input(s): LIPASE, AMYLASE in the last 72 hours. CBC: Recent Labs    02/03/18 1000 02/04/18 0341  WBC 5.4 6.8  HGB 10.5* 10.7*  HCT 32.8* 33.0*  MCV 81.6 79.9*  PLT 367 377   Cardiac Enzymes: Recent Labs    02/03/18 1609 02/03/18 2008 02/04/18 0341  TROPONINI <0.03 <0.03 <0.03   BNP: Invalid input(s): POCBNP D-Dimer: No results for input(s): DDIMER in the last 72 hours. Hemoglobin A1C: No results for input(s): HGBA1C in the last 72 hours. Fasting Lipid Panel: No results for input(s): CHOL, HDL, LDLCALC, TRIG, CHOLHDL, LDLDIRECT in the last 72 hours. Thyroid Function Tests: No results for input(s): TSH, T4TOTAL, T3FREE, THYROIDAB in the last 72 hours.  Invalid input(s): FREET3 Anemia Panel: No results for input(s): VITAMINB12, FOLATE, FERRITIN, TIBC, IRON, RETICCTPCT in the last 72 hours.   PHYSICAL EXAM General: Well developed, well nourished, in no acute distress HEENT:  Normocephalic and atramatic Neck:  No JVD.  Lungs: Clear bilaterally to auscultation and percussion. Heart: HRRR . Normal S1 and S2 without gallops  or murmurs.  Abdomen: Bowel sounds are positive, abdomen soft and non-tender  Msk:  Back normal, normal gait. Normal strength and tone for age. Extremities: No clubbing, cyanosis or edema.   Neuro: Alert and oriented X 3. Psych:  Good affect, responds appropriately  TELEMETRY: had run of non sustained Vt  ASSESSMENT AND PLAN: Nonsustained run of ventricular tachycardia yesterday associated with palpitation.  Advise increasing amiodarone to 400 mg p.o. once a day and discontinue carvedilol and changed to metoprolol 25 p.o. twice daily.  Electrolytes were fine and his ejection fraction is depressed with normal coronaries in the past.  He can be discharged on this medication with follow-up Tuesday at 10 AM.  Active Problems:   Hypokalemia   Malnutrition of moderate degree    , A, MD, [facility] 02/05/2018 8:33 AM

## 2018-02-07 DIAGNOSIS — J84112 Idiopathic pulmonary fibrosis: Secondary | ICD-10-CM | POA: Diagnosis not present

## 2018-02-07 DIAGNOSIS — I5032 Chronic diastolic (congestive) heart failure: Secondary | ICD-10-CM | POA: Diagnosis not present

## 2018-02-07 DIAGNOSIS — Z79899 Other long term (current) drug therapy: Secondary | ICD-10-CM | POA: Diagnosis not present

## 2018-02-07 DIAGNOSIS — M25511 Pain in right shoulder: Secondary | ICD-10-CM | POA: Diagnosis not present

## 2018-02-07 DIAGNOSIS — M0579 Rheumatoid arthritis with rheumatoid factor of multiple sites without organ or systems involvement: Secondary | ICD-10-CM | POA: Diagnosis not present

## 2018-02-07 DIAGNOSIS — M25512 Pain in left shoulder: Secondary | ICD-10-CM | POA: Diagnosis not present

## 2018-02-09 ENCOUNTER — Encounter: Payer: Self-pay | Admitting: Family Medicine

## 2018-02-09 ENCOUNTER — Ambulatory Visit (INDEPENDENT_AMBULATORY_CARE_PROVIDER_SITE_OTHER): Payer: Medicare Other | Admitting: Family Medicine

## 2018-02-09 VITALS — BP 100/60 | HR 70 | Temp 97.9°F | Resp 16 | Ht 69.0 in | Wt 177.1 lb

## 2018-02-09 DIAGNOSIS — E876 Hypokalemia: Secondary | ICD-10-CM | POA: Diagnosis not present

## 2018-02-09 DIAGNOSIS — R002 Palpitations: Secondary | ICD-10-CM

## 2018-02-09 DIAGNOSIS — Z09 Encounter for follow-up examination after completed treatment for conditions other than malignant neoplasm: Secondary | ICD-10-CM | POA: Diagnosis not present

## 2018-02-09 DIAGNOSIS — E440 Moderate protein-calorie malnutrition: Secondary | ICD-10-CM | POA: Diagnosis not present

## 2018-02-09 NOTE — Progress Notes (Addendum)
Name: Jason Fitzgerald   MRN: [id number]    DOB: 1950/02/14   Date:02/09/2018       Progress Note  Subjective  Chief Complaint  Chief Complaint  Patient presents with  . Hospitalization Follow-up    HPI  Hospital discharge follow up: He went to [facility] on 02/09/2018 because of palpitation and restless at night. He was found to have hypokalemia and malnutrition. His potassium was down to 2.5 and at discharge it was up to 3.6. He states he is feeling better, no longer weak ( he was unable to get up from chair), no palpitation, his appetite is good. He states he is back on all his medications as recommended by hospital. He did not bring his list today, but he brought discharge paperwork. Explained importance of bringing all his medication when he comes in to be seen. He has a follow up scheduled with Dr. Humphrey Rolls and also with CHF clinic. He states not having any problems with cough or SOB and stopped taking the cough medication, but has been using symbicort since discharge. Discussed repeating level today and referral to nephrologist since he takes som many medications and has recurrent episodes of hypokalemia but he refuses . His hemoglobin dropped during hospital stay , we will recheck next visit   Malnutrition: he has lost 12 lbs since last visit, he states he used to be over 200 lbs a couple of years ago. He used to drink heavily but not anymore, explained to him that on his next visit we will discuss more testing to find out cause of weight loss.   Patient Active Problem List   Diagnosis Date Noted  . Moderate protein-calorie malnutrition ([place]) 02/09/2018  . Malnutrition of moderate degree 02/04/2018  . Hypokalemia 02/03/2018  . Alcoholism ([place]) 12/08/2017  . Rheumatoid arthritis involving multiple sites with positive rheumatoid factor ([place]) 10/13/2017  . Chronic systolic heart failure ([place]) 03/12/2017  . Atrial fibrillation ([place]) 03/12/2017  . Pulmonary HTN ([place]) 03/12/2017  . Arthritis of left  knee 08/01/2016  . 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 Chronic cough 05/29/2015  . Chronic obstructive pulmonary disease ([place]) 11/20/2014  . CAFL (chronic airflow limitation) ([place]) 10/24/2014  . Arteriosclerosis of coronary artery 10/24/2014  . History of fundoplication 74/25/9563  . HLD (hyperlipidemia) 10/24/2014  . BP (high blood pressure) 10/24/2014  . LAD (lymphadenopathy), mediastinal 07/13/2013  . ILD (interstitial lung disease) ([place]) 06/19/2013  . Interstitial lung disease ([place]) 06/19/2013  . 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 HERNIA REPAIR Right 1973   open  . INGUINAL HERNIA REPAIR Right 11/08/2014   Procedure: LAPAROSCOPIC RIGHT INGUINAL HERNIA REPAIR;  Surgeon: Hubbard Robinson, MD;  Location: [facility] ORS;  Service: General;  Laterality: Right;  . knee arthroscopy Right 1972  . RIGHT/LEFT HEART CATH AND CORONARY ANGIOGRAPHY Right 03/05/2017   Procedure: RIGHT/LEFT HEART CATH AND CORONARY ANGIOGRAPHY;  Surgeon: Dionisio David, MD;  Location: [facility];  Service: Cardiovascular;  Laterality: Right;    Family History  Problem Relation Age of Onset  . Diabetes Mother   . Cancer Father   . AAA (abdominal aortic aneurysm) Brother     Social History   Socioeconomic History  . Marital status: Divorced    Spouse name: Not on file  . Number of children: 4  . Years of education: Not on file  . Highest education level: Not on file  Occupational History  .  Occupation: retired   Scientific laboratory technician  . Financial resource strain: Not hard at all  . Food insecurity:    Worry: Never true    Inability: Never true  . Transportation needs:    Medical: No    Non-medical: No  Tobacco Use  . Smoking status: Never Smoker  . Smokeless tobacco: Never Used  Substance and Sexual Activity  . Alcohol use: Not Currently    Alcohol/week: 0.0 standard drinks    Comment: 2 quarts a week  .  Drug use: Not Currently    Types: Cocaine    Comment: as an young adult   . Sexual activity: Not Currently  Lifestyle  . Physical activity:    Days per week: 0 days    Minutes per session: 0 min  . Stress: Not on file  Relationships  . Social connections:    Talks on phone: More than three times a week    Gets together: More than three times a week    Attends religious service: Never    Active member of club or organization: No    Attends meetings of clubs or organizations: Never    Relationship status: Divorced  . Intimate partner violence:    Fear of current or ex partner: No    Emotionally abused: No    Physically abused: No    Forced sexual activity: No  Other Topics Concern  . Not on file  Social History Narrative   Retired Summer of 2019   Lives alone     Current Outpatient Medications:  .  albuterol (PROVENTIL HFA) 108 (90 Base) MCG/ACT inhaler, Inhale 2 puffs into the lungs as needed., Disp: 1 Inhaler, Rfl: 2 .  amiodarone (PACERONE) 400 MG tablet, Take 1 tablet (400 mg total) by mouth daily., Disp: 30 tablet, Rfl: 0 .  budesonide-formoterol (SYMBICORT) 160-4.5 MCG/ACT inhaler, Inhale 2 puffs into the lungs as needed., Disp: , Rfl:  .  digoxin (LANOXIN) 0.125 MG tablet, Take 1 tablet (0.125 mg total) by mouth daily., Disp: 30 tablet, Rfl: 0 .  furosemide (LASIX) 40 MG tablet, Take 1 tablet (40 mg total) by mouth daily., Disp: 30 tablet, Rfl: 1 .  gabapentin (NEURONTIN) 100 MG capsule, Take 3 capsules (300 mg total) by mouth at bedtime., Disp: 270 capsule, Rfl: 0 .  hydroxychloroquine (PLAQUENIL) 200 MG tablet, Take 2 tablets by mouth daily., Disp: , Rfl: 1 .  leflunomide (ARAVA) 10 MG tablet, Take by mouth., Disp: , Rfl:  .  metaxalone (SKELAXIN) 800 MG tablet, metaxalone 800 mg tablet  TAKE 1 TABLET (800 MG TOTAL) BY MOUTH 2 (TWO) TIMES DAILY AS NEEDED FOR MUSCLE SPASMS., Disp: , Rfl:  .  metoprolol tartrate (LOPRESSOR) 25 MG tablet, Take 1 tablet (25 mg total) by  mouth 2 (two) times daily., Disp: 60 tablet, Rfl: 1 .  pantoprazole (PROTONIX) 40 MG tablet, TAKE 1 TABLET (40 MG TOTAL) BY MOUTH ONCE DAILY., Disp: , Rfl:  .  potassium chloride SA (K-DUR,KLOR-CON) 20 MEQ tablet, Take 1 tablet (20 mEq total) by mouth daily., Disp: 30 tablet, Rfl: 1 .  predniSONE (DELTASONE) 5 MG tablet, Take 1 tablet by mouth daily., Disp: , Rfl:  .  rivaroxaban (XARELTO) 20 MG TABS tablet, Take 1 tablet (20 mg total) by mouth daily with supper., Disp: 90 tablet, Rfl: 3 .  sacubitril-valsartan (ENTRESTO) 49-51 MG, Take 1 tablet by mouth 2 (two) times daily., Disp: , Rfl:  .  spironolactone (ALDACTONE) 25 MG tablet, Take 0.5 tablets (12.5  mg total) by mouth daily., Disp: 45 tablet, Rfl: 3 .  thiamine (VITAMIN B-1) 100 MG tablet, Take 1 tablet (100 mg total) by mouth daily., Disp: 30 tablet, Rfl: 1 .  traMADol (ULTRAM) 50 MG tablet, tramadol 50 mg tablet  TAKE 1 TABLET BY MOUTH EVERY 6 HOURS AS NEEDED FOR UP TO 10 DAYS, Disp: , Rfl:   No Known Allergies  I personally reviewed active problem list, medication list, allergies, family history, social history with the patient/caregiver today. Also reviewed notes and labs done during his hospital stay    ROS  Constitutional: Negative for fever , positive for significant  weight change.  Respiratory: Negative for cough, he has occasional  shortness of breath.   Cardiovascular: Negative for chest pain or palpitations.  Gastrointestinal: Negative for abdominal pain, no bowel changes.  Musculoskeletal: Negative for gait problem or joint swelling.  Skin: Negative for rash.  Neurological: Positive for occasional  Dizziness - when he gets up fast, but no  headache.  No other specific complaints in a complete review of systems (except as listed in HPI above).  Objective  Vitals:   02/09/18 1416  BP: 100/60  Pulse: 70  Resp: 16  Temp: 97.9 F (36.6 C)  TempSrc: Oral  SpO2: 98%  Weight: 177 lb 1.6 oz (80.3 kg)  Height: 5\' 9"   (1.753 m)    Body mass index is 26.15 kg/m.  Physical Exam  Constitutional: Patient appears well-developed but frail and has temporal waisting.  No distress.  HEENT: head atraumatic, normocephalic, pupils equal and reactive to light, , neck supple, throat within normal limits Cardiovascular: Normal rate, regular rhythm and normal heart sounds.  No murmur heard. No BLE edema. Pulmonary/Chest: Effort normal and breath sounds normal. No respiratory distress. Abdominal: Soft.  There is no tenderness. Psychiatric: Patient has a normal mood and affect. behavior is normal. Judgment and thought content normal.  Recent Results (from the past 2160 hour(s))  CBC with Differential/Platelet     Status: None   Collection Time: 12/08/17  9:16 AM  Result Value Ref Range   WBC 5.2 3.8 - 10.8 Thousand/uL   RBC 4.52 4.20 - 5.80 Million/uL   Hemoglobin 13.4 13.2 - 17.1 g/dL   HCT 39.1 38.5 - 50.0 %   MCV 86.5 80.0 - 100.0 fL   MCH 29.6 27.0 - 33.0 pg   MCHC 34.3 32.0 - 36.0 g/dL   RDW 13.6 11.0 - 15.0 %   Platelets 280 140 - 400 Thousand/uL   MPV 9.4 7.5 - 12.5 fL   Neutro Abs 3,110 1,500 - 7,800 cells/uL   Lymphs Abs 1,461 850 - 3,900 cells/uL   WBC mixed population 510 200 - 950 cells/uL   Eosinophils Absolute 78 15 - 500 cells/uL   Basophils Absolute 42 0 - 200 cells/uL   Neutrophils Relative % 59.8 %   Total Lymphocyte 28.1 %   Monocytes Relative 9.8 %   Eosinophils Relative 1.5 %   Basophils Relative 0.8 %  Lipid panel     Status: None   Collection Time: 12/08/17  9:16 AM  Result Value Ref Range   Cholesterol 164 <200 mg/dL   HDL 80 >40 mg/dL   Triglycerides 73 <150 mg/dL   LDL Cholesterol (Calc) 69 mg/dL (calc)    Comment: Reference range: <100 . Desirable range <100 mg/dL for primary prevention;   <70 mg/dL for patients with CHD or diabetic patients  with > or = 2 CHD risk factors. Marland Kitchen LDL-C is  now calculated using the Martin-Hopkins  calculation, which is a validated novel  method providing  better accuracy than the Friedewald equation in the  estimation of LDL-C.  Cresenciano Genre et al. Annamaria Helling. 9629;528(41): 2061-2068  (http://education.[facility].com/faq/FAQ164)    Total CHOL/HDL Ratio 2.1 <5.0 (calc)   Non-HDL Cholesterol (Calc) 84 <130 mg/dL (calc)    Comment: For patients with diabetes plus 1 major ASCVD risk  factor, treating to a non-HDL-C goal of <100 mg/dL  (LDL-C of <70 mg/dL) is considered a therapeutic  option.   Hemoglobin A1c     Status: None   Collection Time: 12/08/17  9:16 AM  Result Value Ref Range   Hgb A1c MFr Bld 5.2 <5.7 % of total Hgb    Comment: For the purpose of screening for the presence of diabetes: . <5.7%       Consistent with the absence of diabetes 5.7-6.4%    Consistent with increased risk for diabetes             (prediabetes) > or =6.5%  Consistent with diabetes . This assay result is consistent with a decreased risk of diabetes. . Currently, no consensus exists regarding use of hemoglobin A1c for diagnosis of diabetes in children. . According to American Diabetes Association (ADA) guidelines, hemoglobin A1c <7.0% represents optimal control in non-pregnant diabetic patients. Different metrics may apply to specific patient populations.  Standards of Medical Care in Diabetes(ADA). .    Mean Plasma Glucose 103 (calc)   eAG (mmol/L) 5.7 (calc)  COMPLETE METABOLIC PANEL WITH GFR     Status: Abnormal   Collection Time: 12/08/17  9:16 AM  Result Value Ref Range   Glucose, Bld 76 65 - 139 mg/dL    Comment: .        Non-fasting reference interval .    BUN 11 7 - 25 mg/dL   Creat 0.54 (L) 0.70 - 1.25 mg/dL    Comment: For patients >76 years of age, the reference limit for Creatinine is approximately 13% higher for people identified as African-American. .    GFR, Est Non African American 110 > OR = 60 mL/min/1.26m2   GFR, Est African American 128 > OR = 60 mL/min/1.45m2   BUN/Creatinine Ratio 20 6 - 22  (calc)   Sodium 139 135 - 146 mmol/L   Potassium 3.0 (L) 3.5 - 5.3 mmol/L   Chloride 95 (L) 98 - 110 mmol/L   CO2 30 20 - 32 mmol/L   Calcium 8.4 (L) 8.6 - 10.3 mg/dL   Total Protein 6.8 6.1 - 8.1 g/dL   Albumin 3.3 (L) 3.6 - 5.1 g/dL   Globulin 3.5 1.9 - 3.7 g/dL (calc)   AG Ratio 0.9 (L) 1.0 - 2.5 (calc)   Total Bilirubin 1.1 0.2 - 1.2 mg/dL   Alkaline phosphatase (APISO) 68 40 - 115 U/L   AST 30 10 - 35 U/L   ALT 18 9 - 46 U/L  Vitamin B1     Status: Abnormal   Collection Time: 12/08/17  9:16 AM  Result Value Ref Range   Vitamin B1 (Thiamine) 7 (L) 8 - 30 nmol/L    Comment: Marland Kitchen Vitamin supplementation within 24 hours prior to blood draw may affect the accuracy of the results. . This test was developed and its analytical performance characteristics have been determined by [place], [place]. It has not been cleared or approved by the U.S. Food and Drug Administration. This assay has been validated pursuant to the CLIA  regulations and is used for clinical purposes. .   Iron, TIBC and Ferritin Panel     Status: Abnormal   Collection Time: 12/08/17  9:16 AM  Result Value Ref Range   Iron 57 50 - 180 mcg/dL   TIBC 218 (L) 250 - 425 mcg/dL (calc)   %SAT 26 20 - 48 % (calc)   Ferritin 255 24 - 380 ng/mL  B12 and Folate Panel     Status: None   Collection Time: 12/08/17  9:16 AM  Result Value Ref Range   Vitamin B-12 388 200 - 1,100 pg/mL    Comment: . Please Note: Although the reference range for vitamin B12 is [phone] pg/mL, it has been reported that between 5 and 10% of patients with values between 200 and 400 pg/mL may experience neuropsychiatric and hematologic abnormalities due to occult B12 deficiency; less than 1% of patients with values above 400 pg/mL will have symptoms. .    Folate 8.3 ng/mL    Comment:                            Reference Range                            Low:           <3.4                             Borderline:    3.4-5.4                            Normal:        >5.4 .   Hepatitis C antibody     Status: None   Collection Time: 12/08/17  9:16 AM  Result Value Ref Range   Hepatitis C Ab NON-REACTIVE NON-REACTI   SIGNAL TO CUT-OFF 0.06 <1.00    Comment: . HCV antibody was non-reactive. There is no laboratory  evidence of HCV infection. . In most cases, no further action is required. However, if recent HCV exposure is suspected, a test for HCV RNA (test code [phone]) is suggested. . For additional information please refer to http://education.quest[facility].com/faq/FAQ22v1 (This link is being provided for informational/ educational purposes only.) .   POC Urinalysis Dipstick OB     Status: Abnormal   Collection Time: 12/08/17  9:47 AM  Result Value Ref Range   Color, UA dark amber    Clarity, UA clear    Glucose, UA Negative Negative   Bilirubin, UA Negative    Ketones, UA 80     Comment: Large   Spec Grav, UA 1.015 1.010 - 1.025   Blood, UA Negative    pH, UA 6.0 5.0 - 8.0   POC,PROTEIN,UA Large (3+) (A) Negative, Trace   Urobilinogen, UA 1.0 0.2 or 1.0 E.U./dL   Nitrite, UA neg    Leukocytes, UA Negative Negative   Appearance     Odor    Potassium     Status: None   Collection Time: 12/24/17 12:15 PM  Result Value Ref Range   Potassium 3.7 3.5 - 5.3 mmol/L  Magnesium     Status: None   Collection Time: 12/24/17 12:15 PM  Result Value Ref Range   Magnesium 2.0 1.5 - 2.5 mg/dL  Urine Microalbumin w/creat. ratio     Status:  None   Collection Time: 12/24/17  3:47 PM  Result Value Ref Range   Creatinine, Urine 226 20 - 320 mg/dL   Microalb, Ur 2.5 mg/dL    Comment: Reference Range Not established    Microalb Creat Ratio 11 <30 mcg/mg creat    Comment: . The ADA defines abnormalities in albumin excretion as follows: Marland Kitchen Category         Result (mcg/mg creatinine) . Normal                    <30 Microalbuminuria         30-299  Clinical albuminuria   > OR =  300 . The ADA recommends that at least two of three specimens collected within a 3-6 month period be abnormal before considering a patient to be within a diagnostic category.   Basic metabolic panel     Status: Abnormal   Collection Time: 02/03/18 10:00 AM  Result Value Ref Range   Sodium 139 135 - 145 mmol/L   Potassium 2.5 (LL) 3.5 - 5.1 mmol/L    Comment: CRITICAL RESULT CALLED TO, READ BACK BY AND VERIFIED WITH  JESSICA COLTRANE AT 1045 02/03/18 SDR    Chloride 99 98 - 111 mmol/L   CO2 31 22 - 32 mmol/L   Glucose, Bld 113 (H) 70 - 99 mg/dL   BUN 11 8 - 23 mg/dL   Creatinine, Ser 0.68 0.61 - 1.24 mg/dL   Calcium 8.4 (L) 8.9 - 10.3 mg/dL   GFR calc non Af Amer >60 >60 mL/min   GFR calc Af Amer >60 >60 mL/min   Anion gap 9 5 - 15    Comment: Performed at [facility], [address] [place], [place] [address]  CBC     Status: Abnormal   Collection Time: 02/03/18 10:00 AM  Result Value Ref Range   WBC 5.4 4.0 - 10.5 K/uL   RBC 4.02 (L) 4.22 - 5.81 MIL/uL   Hemoglobin 10.5 (L) 13.0 - 17.0 g/dL   HCT 32.8 (L) 39.0 - 52.0 %   MCV 81.6 80.0 - 100.0 fL   MCH 26.1 26.0 - 34.0 pg   MCHC 32.0 30.0 - 36.0 g/dL   RDW 15.5 11.5 - 15.5 %   Platelets 367 150 - 400 K/uL   nRBC 0.0 0.0 - 0.2 %    Comment: Performed at [facility], [address] [place], [place] [address]  Troponin I - ONCE - STAT     Status: None   Collection Time: 02/03/18 10:00 AM  Result Value Ref Range   Troponin I <0.03 <0.03 ng/mL    Comment: Performed at [facility], [address] [place], [place] [address]  Brain natriuretic peptide     Status: Abnormal   Collection Time: 02/03/18 10:00 AM  Result Value Ref Range   B Natriuretic Peptide 869.0 (H) 0.0 - 100.0 pg/mL    Comment: Performed at [facility], [address] [place], [place] [address]  Magnesium     Status: None   Collection Time: 02/03/18 10:00 AM  Result Value Ref Range   Magnesium 2.0 1.7 - 2.4  mg/dL    Comment: Performed at [facility], [address] [place], [place] [address]  Digoxin level     Status: Abnormal   Collection Time: 02/03/18 10:19 AM  Result Value Ref Range   Digoxin Level <0.2 (L) 0.8 - 2.0 ng/mL    Comment: Performed at  [facility], [address] [place], [place] [address]  Troponin I - Now Then Q6H     Status: None   Collection Time: 02/03/18  4:09 PM  Result Value Ref Range   Troponin I <0.03 <0.03 ng/mL    Comment: Performed at [facility], [address] [place], [place] [address]  Potassium     Status: Abnormal   Collection Time: 02/03/18  8:08 PM  Result Value Ref Range   Potassium 3.1 (L) 3.5 - 5.1 mmol/L    Comment: Performed at [facility], [address] [place], [place] [address]  Troponin I - Now Then Q6H     Status: None   Collection Time: 02/03/18  8:08 PM  Result Value Ref Range   Troponin I <0.03 <0.03 ng/mL    Comment: Performed at [facility], [address] [place], [place] [address]  Troponin I - Now Then Q6H     Status: None   Collection Time: 02/04/18  3:41 AM  Result Value Ref Range   Troponin I <0.03 <0.03 ng/mL    Comment: Performed at [facility], [address] [place], [place] [address]  Basic metabolic panel     Status: Abnormal   Collection Time: 02/04/18  3:41 AM  Result Value Ref Range   Sodium 138 135 - 145 mmol/L   Potassium 2.7 (LL) 3.5 - 5.1 mmol/L    Comment: CRITICAL RESULT CALLED TO, READ BACK BY AND VERIFIED WITH TAKERA NESBITT 02/04/18 0414 KBH    Chloride 101 98 - 111 mmol/L   CO2 31 22 - 32 mmol/L   Glucose, Bld 114 (H) 70 - 99 mg/dL   BUN 10 8 - 23 mg/dL   Creatinine, Ser 0.67 0.61 - 1.24 mg/dL   Calcium 7.8 (L) 8.9 - 10.3 mg/dL   GFR calc non Af Amer >60 >60 mL/min   GFR calc Af Amer >60 >60 mL/min   Anion gap 6 5 - 15    Comment: Performed at [facility], [address] [place], [place] [address]  CBC     Status:  Abnormal   Collection Time: 02/04/18  3:41 AM  Result Value Ref Range   WBC 6.8 4.0 - 10.5 K/uL   RBC 4.13 (L) 4.22 - 5.81 MIL/uL   Hemoglobin 10.7 (L) 13.0 - 17.0 g/dL   HCT 33.0 (L) 39.0 - 52.0 %   MCV 79.9 (L) 80.0 - 100.0 fL   MCH 25.9 (L) 26.0 - 34.0 pg   MCHC 32.4 30.0 - 36.0 g/dL   RDW 15.5 11.5 - 15.5 %   Platelets 377 150 - 400 K/uL   nRBC 0.0 0.0 - 0.2 %    Comment: Performed at [facility], [address] [place], [place] [address]  Magnesium     Status: None   Collection Time: 02/04/18  3:41 AM  Result Value Ref Range   Magnesium 2.0 1.7 - 2.4 mg/dL    Comment: Performed at [facility], [address] [place], [place] [address]  ECHOCARDIOGRAM COMPLETE     Status: None   Collection Time: 02/04/18  8:07 AM  Result Value Ref Range   Weight 3,024 oz   Height 69 in   BP 97/67 mmHg  Basic metabolic panel     Status: Abnormal   Collection Time: 02/04/18  7:09 PM  Result Value Ref Range   Sodium 138 135 - 145 mmol/L   Potassium 3.6 3.5 -  5.1 mmol/L   Chloride 99 98 - 111 mmol/L   CO2 31 22 - 32 mmol/L   Glucose, Bld 125 (H) 70 - 99 mg/dL   BUN 13 8 - 23 mg/dL   Creatinine, Ser 1.03 0.61 - 1.24 mg/dL   Calcium 8.1 (L) 8.9 - 10.3 mg/dL   GFR calc non Af Amer >60 >60 mL/min   GFR calc Af Amer >60 >60 mL/min   Anion gap 8 5 - 15    Comment: Performed at [facility], [address] [place], [place] [address]  Magnesium     Status: None   Collection Time: 02/04/18  7:09 PM  Result Value Ref Range   Magnesium 1.9 1.7 - 2.4 mg/dL    Comment: Performed at [facility], [address] [place], [place] [address]  Basic metabolic panel     Status: Abnormal   Collection Time: 02/05/18  3:30 AM  Result Value Ref Range   Sodium 135 135 - 145 mmol/L   Potassium 3.6 3.5 - 5.1 mmol/L   Chloride 100 98 - 111 mmol/L   CO2 28 22 - 32 mmol/L   Glucose, Bld 103 (H) 70 - 99 mg/dL   BUN 13 8 - 23 mg/dL   Creatinine, Ser 0.77 0.61 - 1.24  mg/dL   Calcium 7.8 (L) 8.9 - 10.3 mg/dL   GFR calc non Af Amer >60 >60 mL/min   GFR calc Af Amer >60 >60 mL/min   Anion gap 7 5 - 15    Comment: Performed at [facility], [name] [place], [place] [address]     PHQ2/9: Depression screen [facility] 2/9 02/09/2018 12/08/2017 08/17/2017 05/11/2017 04/09/2017  Decreased Interest 0 0 0 0 0  Down, Depressed, Hopeless 0 0 0 0 0  PHQ - 2 Score 0 0 0 0 0  Altered sleeping - 0 - - -  Tired, decreased energy - 0 - - -  Change in appetite - 0 - - -  Feeling bad or failure about yourself  - 0 - - -  Trouble concentrating - 0 - - -  Moving slowly or fidgety/restless - 0 - - -  Suicidal thoughts - 0 - - -  PHQ-9 Score - 0 - - -  Difficult doing work/chores - Not difficult at all - - -     Fall Risk: Fall Risk  02/09/2018 12/14/2017 12/08/2017 08/17/2017 05/11/2017  Falls in the past year? 0 1 0 No No  Number falls in past yr: 0 0 0 - -  Injury with Fall? 0 1 0 - -     Assessment & Plan  1. Hospital discharge follow-up   2. Hypokalemia  Refuses to have labs today, but feeling better clinically   3. Palpitation  Resolved   4. Moderate protein-calorie malnutrition ([place])  He lost over 12 lbs in the past couple of months, he will try increasing caloric intake, we will check more labs on his next visit.

## 2018-02-09 NOTE — Patient Instructions (Signed)
High-Protein and High-Calorie Diet Eating high-protein and high-calorie foods can help you to gain weight, heal after an injury, and recover after an illness or surgery. The specific amount of daily protein and calories you need depends on: Your body weight. The reason this diet is recommended for you. What is my plan? Generally, a high-protein, high-calorie diet involves: Eating 250-500 extra calories each day. Making sure that you get enough of your daily calories from protein. Ask your health care provider how many of your calories should come from protein. Talk with a health care provider, such as a diet and nutrition specialist (dietitian), about how much protein and how many calories you need each day. Follow the diet as directed by your health care provider. What are tips for following this plan?  Preparing meals Add whole milk, half-and-half, or heavy cream to cereal, pudding, soup, or hot cocoa. Add whole milk to instant breakfast drinks. Add peanut butter to oatmeal or smoothies. Add powdered milk to baked goods, smoothies, or milkshakes. Add powdered milk, cream, or butter to mashed potatoes. Add cheese to cooked vegetables. Make whole-milk yogurt parfaits. Top them with granola, fruit, or nuts. Add cottage cheese to your fruit. Add avocado, cheese, or both to sandwiches or salads. Add meat, poultry, or seafood to rice, pasta, casseroles, salads, and soups. Use mayonnaise when making egg salad, chicken salad, or tuna salad. Use peanut butter as a dip for vegetables or as a topping for pretzels, celery, or crackers. Add beans to casseroles, dips, and spreads. Add pureed beans to sauces and soups. Replace calorie-free drinks with calorie-containing drinks, such as milk and fruit juice. Replace water with milk or heavy cream when making foods such as oatmeal, pudding, or cocoa. General instructions Ask your health care provider if you should take a nutritional supplement. Try to  eat six small meals each day instead of three large meals. Eat a balanced diet. In each meal, include one food that is high in protein. Keep nutritious snacks available, such as nuts, trail mixes, dried fruit, and yogurt. If you have kidney disease or diabetes, talk with your health care provider about how much protein is safe for you. Too much protein may put extra stress on your kidneys. Drink your calories. Choose high-calorie drinks and have them after your meals. What high-protein foods should I eat?  Vegetables Soybeans. Peas. Grains Quinoa. Bulgur wheat. Meats and other proteins Beef, pork, and poultry. Fish and seafood. Eggs. Tofu. Textured vegetable protein (TVP). Peanut butter. Nuts and seeds. Dried beans. Protein powders. Dairy Whole milk. Whole-milk yogurt. Powdered milk. Cheese. [employer]. Eggnog. Beverages High-protein supplement drinks. Soy milk. Other foods Protein bars. The items listed above may not be a complete list of high-protein foods and beverages. Contact a dietitian for more options. What high-calorie foods should I eat? Fruits Dried fruit. Fruit leather. Canned fruit in syrup. Fruit juice. Avocado. Vegetables Vegetables cooked in oil or butter. Fried potatoes. Grains Pasta. Quick breads. Muffins. Pancakes. Ready-to-eat cereal. Meats and other proteins Peanut butter. Nuts and seeds. Dairy Heavy cream. Whipped cream. Cream cheese. Sour cream. Ice cream. Custard. Pudding. Beverages Meal-replacement beverages. Nutrition shakes. Fruit juice. Sugar-sweetened soft drinks. Seasonings and condiments Salad dressing. Mayonnaise. Alfredo sauce. Fruit preserves or jelly. Honey. Syrup. Sweets and desserts Cake. Cookies. Pie. Pastries. Candy bars. Chocolate. Fats and oils Butter or margarine. Oil. Gravy. Other foods Meal-replacement bars. The items listed above may not be a complete list of high-calorie foods and beverages. Contact a dietitian for more  options. Summary A high-protein, high-calorie diet can help you gain weight or heal faster after an injury, illness, or surgery. To increase your protein and calories, add ingredients such as whole milk, peanut butter, cheese, beans, meat, or seafood to meal items. To get enough extra calories each day, include high-calorie foods and beverages at each meal. Adding a high-calorie drink or shake can be an easy way to help you get enough calories each day. Talk with your healthcare provider or dietitian about the best options for you. This information is not intended to replace advice given to you by your health care provider. Make sure you discuss any questions you have with your health care provider. Document Released: 01/19/2005 Document Revised: 12/01/2016 Document Reviewed: 12/01/2016 Elsevier Interactive Patient Education  2019 [place] Malnutrition Protein-energy malnutrition is when a person does not eat enough protein, fat, and calories. When this happens over time, it can lead to severe loss of muscle tissue (muscle wasting). This condition also affects the body's defense system (immune system) and can lead to other health problems. What are the causes? This condition may be caused by:  Not eating enough protein, fat, or calories.  Having certain chronic medical conditions.  Eating too little. What increases the risk? The following factors may make you more likely to develop this condition:  Living in poverty.  Long-term hospitalization.  Alcohol or drug dependency. Addiction often leads to a lifestyle in which proper diet is ignored. Dependency can also hurt the metabolism and the body's ability to absorb nutrients.  Eating disorders, such as anorexia nervosa or bulimia.  Chewing or swallowing problems. People with these disorders may not eat enough.  Having certain conditions, such as: ? Inflammatory bowel disease. Inflammation of the intestines makes it  difficult for the body to absorb nutrients. ? Cancer or AIDS. These diseases can cause a loss of appetite. ? Chronic heart failure. This interferes with how the body uses nutrients. ? Cystic fibrosis. This disease can make it difficult for the body to absorb nutrients.  Eating a diet that extremely restricts protein, fat, or calorie intake. What are the signs or symptoms? Symptoms of this condition include:  Fatigue.  Weakness.  Dizziness.  Fainting.  Weight loss.  Loss of muscle tone and muscle mass.  Poor immune response.  Lack of menstruation.  Poor memory.  Hair loss.  Skin changes. How is this diagnosed? This condition may be diagnosed based on:  Your medical and dietary history.  A physical exam. This may include a measurement of your body mass index (BMI).  Blood tests. How is this treated? This condition may be managed with:  Nutrition therapy. This may include working with a diet and nutrition specialist (dietitian).  Treatment for underlying conditions. People with severe protein-energy malnutrition may need to be treated in a hospital. This may involve receiving nutrition and fluids through an IV. Follow these instructions at home:   Eat a balanced diet. In each meal, include at least one food that is high in protein. Foods that are high in protein include: ? Meat. ? Poultry. ? Fish. ? Eggs. ? Cheese. ? Milk. ? Beans. ? Nuts.  Eat nutrient-rich foods that are easy to swallow and digest, such as: ? Fruit and yogurt smoothies. ? Oatmeal with nut butter.  Try to eat six small meals each day instead of three large meals.  Take vitamin and protein supplements as told by your health care provider or dietitian.  Follow your health care  provider's recommendations about exercise and activity.  Keep all follow-up visits as told by your health care provider. This is important. Contact a health care provider if you:  Have increased weakness or  fatigue.  Faint.  Are a woman and you stop having your period (menstruating).  Have rapid hair loss.  Have unexpected weight loss.  Have diarrhea.  Have nausea and vomiting. Get help right away if you have:  Difficulty breathing.  Chest pain. Summary  Protein-energy malnutrition is when a person does not eat enough protein, fat, and calories.  Protein-energy malnutrition can lead to severe loss of muscle tissue (muscle wasting). This condition also affects the body's defense system (immune system) and can lead to other health problems.  Talk with your health care provider about treatment for this condition. Effective treatment depends on the underlying cause of the malnutrition. This information is not intended to replace advice given to you by your health care provider. Make sure you discuss any questions you have with your health care provider. Document Released: 12/05/2004 Document Revised: 02/03/2017 Document Reviewed: 02/03/2017 Elsevier Interactive Patient Education  2019 [employer].

## 2018-03-03 ENCOUNTER — Other Ambulatory Visit: Payer: Self-pay | Admitting: Family Medicine

## 2018-03-03 DIAGNOSIS — E519 Thiamine deficiency, unspecified: Secondary | ICD-10-CM

## 2018-03-03 NOTE — Telephone Encounter (Signed)
Refill request for general medication. Vitamin B-1  Last office visit 02/09/2018  Follow up 03/14/2018

## 2018-03-10 DIAGNOSIS — J84112 Idiopathic pulmonary fibrosis: Secondary | ICD-10-CM | POA: Diagnosis not present

## 2018-03-10 DIAGNOSIS — I5032 Chronic diastolic (congestive) heart failure: Secondary | ICD-10-CM | POA: Diagnosis not present

## 2018-03-10 DIAGNOSIS — M25512 Pain in left shoulder: Secondary | ICD-10-CM | POA: Diagnosis not present

## 2018-03-10 DIAGNOSIS — M0579 Rheumatoid arthritis with rheumatoid factor of multiple sites without organ or systems involvement: Secondary | ICD-10-CM | POA: Diagnosis not present

## 2018-03-10 DIAGNOSIS — Z79899 Other long term (current) drug therapy: Secondary | ICD-10-CM | POA: Diagnosis not present

## 2018-03-14 ENCOUNTER — Ambulatory Visit (INDEPENDENT_AMBULATORY_CARE_PROVIDER_SITE_OTHER): Payer: Medicare Other | Admitting: Family Medicine

## 2018-03-14 ENCOUNTER — Encounter: Payer: Self-pay | Admitting: Family Medicine

## 2018-03-14 VITALS — BP 122/68 | HR 86 | Temp 97.6°F | Resp 16 | Ht 69.0 in | Wt 184.8 lb

## 2018-03-14 DIAGNOSIS — J432 Centrilobular emphysema: Secondary | ICD-10-CM

## 2018-03-14 DIAGNOSIS — F1021 Alcohol dependence, in remission: Secondary | ICD-10-CM

## 2018-03-14 DIAGNOSIS — Z1211 Encounter for screening for malignant neoplasm of colon: Secondary | ICD-10-CM | POA: Diagnosis not present

## 2018-03-14 DIAGNOSIS — Z23 Encounter for immunization: Secondary | ICD-10-CM | POA: Diagnosis not present

## 2018-03-14 DIAGNOSIS — M0630 Rheumatoid nodule, unspecified site: Secondary | ICD-10-CM

## 2018-03-14 DIAGNOSIS — E519 Thiamine deficiency, unspecified: Secondary | ICD-10-CM

## 2018-03-14 DIAGNOSIS — I509 Heart failure, unspecified: Secondary | ICD-10-CM

## 2018-03-14 DIAGNOSIS — M05742 Rheumatoid arthritis with rheumatoid factor of left hand without organ or systems involvement: Secondary | ICD-10-CM

## 2018-03-14 DIAGNOSIS — M06329 Rheumatoid nodule, unspecified elbow: Secondary | ICD-10-CM

## 2018-03-14 DIAGNOSIS — I4891 Unspecified atrial fibrillation: Secondary | ICD-10-CM

## 2018-03-14 DIAGNOSIS — M05741 Rheumatoid arthritis with rheumatoid factor of right hand without organ or systems involvement: Secondary | ICD-10-CM

## 2018-03-14 DIAGNOSIS — E785 Hyperlipidemia, unspecified: Secondary | ICD-10-CM

## 2018-03-14 MED ORDER — ASPIRIN 325 MG PO TBEC: 325.0000 mg | DELAYED_RELEASE_TABLET | Freq: Every day | ORAL | 1 refills | Status: DC

## 2018-03-14 NOTE — Progress Notes (Signed)
Name: Jason Fitzgerald   MRN: [id number]    DOB: 1950/08/01   Date:03/14/2018       Progress Note  Subjective  Chief Complaint  Chief Complaint  Patient presents with  . Follow-up  . Hypertension  . COPD  . Atrial Fibrillation    HPI  History of alcoholism: he states he quit drinking a couple of months ago, taking Vitamin B1 and is feeling better.   HTN/CHF and Afib: rate controlled with beta-blocker, amiodarone, also taking aldactone, lasix, potassium. Potassium is finally back to normal, denies SOB or orthopnea, he states no longer having palpitation. He states he has been compliant with medication. Off Xarelto and Entresto because of cost of medication, advised to take aspiin 325 mg daily , discussed risk of bleeding  RA: under the care of Dr. Jefm Bryant. He has been taking Plaquenil double dose because pharmacy dispensed two different bottles with pills looking differently, we will place referral to chronic care management.   Malnutrition: doing better, eating more now that he stopped drinking   Emphysema: never smoked, states his mother did and also co-workers, under the care of Dr. Raul Del, has daily cough but no SOB or wheezing. Using Symbicort  Patient Active Problem List   Diagnosis Date Noted  . Moderate protein-calorie malnutrition ([place]) 02/09/2018  . Malnutrition of moderate degree 02/04/2018  . Hypokalemia 02/03/2018  . Alcoholism ([place]) 12/08/2017  . Rheumatoid arthritis involving multiple sites with positive rheumatoid factor ([place]) 10/13/2017  . Atrial fibrillation ([place]) 03/12/2017  . Arthritis of left knee 08/01/2016  . 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 Chronic cough 05/29/2015  . Chronic obstructive pulmonary disease ([place]) 11/20/2014  . CAFL (chronic airflow limitation) ([place]) 10/24/2014  . Arteriosclerosis of coronary artery 10/24/2014  . History of fundoplication 93/81/0175  . HLD (hyperlipidemia) 10/24/2014  . BP (high blood pressure)  10/24/2014  . LAD (lymphadenopathy), mediastinal 07/13/2013  . ILD (interstitial lung disease) ([place]) 06/19/2013  . Interstitial lung disease ([place]) 06/19/2013  . 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 HERNIA REPAIR Right 1973   open  . INGUINAL HERNIA REPAIR Right 11/08/2014   Procedure: LAPAROSCOPIC RIGHT INGUINAL HERNIA REPAIR;  Surgeon: Hubbard Robinson, MD;  Location: [facility];  Service: General;  Laterality: Right;  . knee arthroscopy Right 1972  . RIGHT/LEFT HEART CATH AND CORONARY ANGIOGRAPHY Right 03/05/2017   Procedure: RIGHT/LEFT HEART CATH AND CORONARY ANGIOGRAPHY;  Surgeon: Dionisio David, MD;  Location: [facility];  Service: Cardiovascular;  Laterality: Right;    Family History  Problem Relation Age of Onset  . Diabetes Mother   . Cancer Father   . AAA (abdominal aortic aneurysm) Brother     Social History   Socioeconomic History  . Marital status: Divorced    Spouse name: Not on file  . Number of children: 4  . Years of education: Not on file  . Highest education level: Not on file  Occupational History  . Occupation: retired   Scientific laboratory technician  . Financial resource strain: Not hard at all  . Food insecurity:    Worry: Never true    Inability: Never true  . Transportation needs:    Medical: No    Non-medical: No  Tobacco Use  . Smoking status: Never Smoker  . Smokeless tobacco: Never Used  Substance and Sexual Activity  . Alcohol use: Not Currently    Alcohol/week: 0.0 standard drinks  Comment: 2 quarts a week  . Drug use: Not Currently    Types: Cocaine    Comment: as an young adult   . Sexual activity: Not Currently  Lifestyle  . Physical activity:    Days per week: 0 days    Minutes per session: 0 min  . Stress: Not on file  Relationships  . Social connections:    Talks on phone: More than three times a week    Gets together: More than three times a week     Attends religious service: Never    Active member of club or organization: No    Attends meetings of clubs or organizations: Never    Relationship status: Divorced  . Intimate partner violence:    Fear of current or ex partner: No    Emotionally abused: No    Physically abused: No    Forced sexual activity: No  Other Topics Concern  . Not on file  Social History Narrative   Retired Summer of 2019   Lives alone     Current Outpatient Medications:  .  albuterol (PROVENTIL HFA) 108 (90 Base) MCG/ACT inhaler, Inhale 2 puffs into the lungs as needed., Disp: 1 Inhaler, Rfl: 2 .  amiodarone (PACERONE) 400 MG tablet, Take 1 tablet (400 mg total) by mouth daily., Disp: 30 tablet, Rfl: 0 .  budesonide-formoterol (SYMBICORT) 160-4.5 MCG/ACT inhaler, Inhale 2 puffs into the lungs as needed., Disp: , Rfl:  .  digoxin (LANOXIN) 0.125 MG tablet, Take 1 tablet (0.125 mg total) by mouth daily., Disp: 30 tablet, Rfl: 0 .  furosemide (LASIX) 40 MG tablet, Take 1 tablet (40 mg total) by mouth daily., Disp: 30 tablet, Rfl: 1 .  hydroxychloroquine (PLAQUENIL) 200 MG tablet, Take 2 tablets by mouth daily., Disp: , Rfl: 1 .  leflunomide (ARAVA) 10 MG tablet, Take by mouth., Disp: , Rfl:  .  metoprolol tartrate (LOPRESSOR) 25 MG tablet, Take 1 tablet (25 mg total) by mouth 2 (two) times daily., Disp: 60 tablet, Rfl: 1 .  potassium chloride SA (K-DUR,KLOR-CON) 20 MEQ tablet, Take 1 tablet (20 mEq total) by mouth daily., Disp: 30 tablet, Rfl: 1 .  predniSONE (DELTASONE) 5 MG tablet, Take 1 tablet by mouth daily., Disp: , Rfl:  .  spironolactone (ALDACTONE) 25 MG tablet, Take 0.5 tablets (12.5 mg total) by mouth daily., Disp: 45 tablet, Rfl: 3 .  Thiamine HCl (VITAMIN B1) 100 MG TABS, TAKE 1 TABLET BY MOUTH EVERY DAY, Disp: 30 tablet, Rfl: 1 .  aspirin EC 325 MG EC tablet, Take 1 tablet (325 mg total) by mouth daily., Disp: 90 tablet, Rfl: 1 .  pantoprazole (PROTONIX) 40 MG tablet, TAKE 1 TABLET (40 MG TOTAL) BY  MOUTH ONCE DAILY., Disp: , Rfl:   No Known Allergies  I personally reviewed active problem list, medication list, allergies, family history, social history, health maintenance with the patient/caregiver today.   ROS   Constitutional: Negative for fever or weight change.  Respiratory: positive for cough but no  shortness of breath.   Cardiovascular: Negative for chest pain or palpitations.  Gastrointestinal: Negative for abdominal pain, no bowel changes.  Musculoskeletal: Negative for gait problem or joint swelling.  Skin: Negative for rash.  Neurological: Negative for dizziness or headache.  No other specific complaints in a complete review of systems (except as listed in HPI above).  Objective  Vitals:   03/14/18 1341  BP: 122/68  Pulse: 86  Resp: 16  Temp: 97.6 F (36.4 C)  TempSrc: Oral  SpO2: 98%  Weight: 184 lb 12.8 oz (83.8 kg)  Height: 5\' 9"  (1.753 m)    Body mass index is 27.29 kg/m.  Physical Exam  Constitutional: Patient appears well-developed and well-nourished. No distress.  HEENT: head atraumatic, normocephalic, strabismus, pupils equal and reactive to light,  neck supple, throat within normal limits Cardiovascular: Normal rate, regular rhythm and normal heart sounds.  No murmur heard. No BLE edema. Pulmonary/Chest: Effort normal , mild end inspiratory coarse crackles. No respiratory distress. Abdominal: Soft.  There is no tenderness. Psychiatric: Patient has a normal mood and affect. behavior is normal. Judgment and thought content normal.  Recent Results (from the past 2160 hour(s))  Potassium     Status: None   Collection Time: 12/24/17 12:15 PM  Result Value Ref Range   Potassium 3.7 3.5 - 5.3 mmol/L  Magnesium     Status: None   Collection Time: 12/24/17 12:15 PM  Result Value Ref Range   Magnesium 2.0 1.5 - 2.5 mg/dL  Urine Microalbumin w/creat. ratio     Status: None   Collection Time: 12/24/17  3:47 PM  Result Value Ref Range   Creatinine,  Urine 226 20 - 320 mg/dL   Microalb, Ur 2.5 mg/dL    Comment: Reference Range Not established    Microalb Creat Ratio 11 <30 mcg/mg creat    Comment: . The ADA defines abnormalities in albumin excretion as follows: [place] Kitchen Category         Result (mcg/mg creatinine) . Normal                    <30 Microalbuminuria         30-299  Clinical albuminuria   > OR = 300 . The ADA recommends that at least two of three specimens collected within a 3-6 month period be abnormal before considering a patient to be within a diagnostic category.   Basic metabolic panel     Status: Abnormal   Collection Time: 02/03/18 10:00 AM  Result Value Ref Range   Sodium 139 135 - 145 mmol/L   Potassium 2.5 (LL) 3.5 - 5.1 mmol/L    Comment: CRITICAL RESULT CALLED TO, READ BACK BY AND VERIFIED WITH  JESSICA COLTRANE AT 1045 02/03/18 SDR    Chloride 99 98 - 111 mmol/L   CO2 31 22 - 32 mmol/L   Glucose, Bld 113 (H) 70 - 99 mg/dL   BUN 11 8 - 23 mg/dL   Creatinine, Ser 0.68 0.61 - 1.24 mg/dL   Calcium 8.4 (L) 8.9 - 10.3 mg/dL   GFR calc non Af Amer >60 >60 mL/min   GFR calc Af Amer >60 >60 mL/min   Anion gap 9 5 - 15    Comment: Performed at [facility], [address] [place], [place] [address]  CBC     Status: Abnormal   Collection Time: 02/03/18 10:00 AM  Result Value Ref Range   WBC 5.4 4.0 - 10.5 K/uL   RBC 4.02 (L) 4.22 - 5.81 MIL/uL   Hemoglobin 10.5 (L) 13.0 - 17.0 g/dL   HCT 32.8 (L) 39.0 - 52.0 %   MCV 81.6 80.0 - 100.0 fL   MCH 26.1 26.0 - 34.0 pg   MCHC 32.0 30.0 - 36.0 g/dL   RDW 15.5 11.5 - 15.5 %   Platelets 367 150 - 400 K/uL   nRBC 0.0 0.0 - 0.2 %    Comment: Performed at [facility], [address] [place], [place] [address]  Troponin I - ONCE - STAT     Status: None   Collection Time: 02/03/18 10:00 AM  Result Value Ref Range   Troponin I <0.03 <0.03 ng/mL    Comment: Performed at [facility], [address] [place], [place] [address]  Brain  natriuretic peptide     Status: Abnormal   Collection Time: 02/03/18 10:00 AM  Result Value Ref Range   B Natriuretic Peptide 869.0 (H) 0.0 - 100.0 pg/mL    Comment: Performed at [facility], [address], [place], [place] [address]  Magnesium     Status: None   Collection Time: 02/03/18 10:00 AM  Result Value Ref Range   Magnesium 2.0 1.7 - 2.4 mg/dL    Comment: Performed at [facility], [address] [place], [place] [address]  Digoxin level     Status: Abnormal   Collection Time: 02/03/18 10:19 AM  Result Value Ref Range   Digoxin Level <0.2 (L) 0.8 - 2.0 ng/mL    Comment: Performed at [facility], [address] [place], [place] [address]  Troponin I - Now Then Q6H     Status: None   Collection Time: 02/03/18  4:09 PM  Result Value Ref Range   Troponin I <0.03 <0.03 ng/mL    Comment: Performed at [facility], [address] [place], [place] [address]  Potassium     Status: Abnormal   Collection Time: 02/03/18  8:08 PM  Result Value Ref Range   Potassium 3.1 (L) 3.5 - 5.1 mmol/L    Comment: Performed at [facility], [address] [place], [place] [address]  Troponin I - Now Then Q6H     Status: None   Collection Time: 02/03/18  8:08 PM  Result Value Ref Range   Troponin I <0.03 <0.03 ng/mL    Comment: Performed at [facility], [address] [place], [place] [address]  Troponin I - Now Then Q6H     Status: None   Collection Time: 02/04/18  3:41 AM  Result Value Ref Range   Troponin I <0.03 <0.03 ng/mL    Comment: Performed at [facility], [address] [place], [place] [address]  Basic metabolic panel     Status: Abnormal   Collection Time: 02/04/18  3:41 AM  Result Value Ref Range   Sodium 138 135 - 145 mmol/L   Potassium 2.7 (LL) 3.5 - 5.1 mmol/L    Comment: CRITICAL RESULT CALLED TO, READ BACK BY AND VERIFIED WITH TAKERA NESBITT 02/04/18 0414 KBH    Chloride 101 98 - 111 mmol/L    CO2 31 22 - 32 mmol/L   Glucose, Bld 114 (H) 70 - 99 mg/dL   BUN 10 8 - 23 mg/dL   Creatinine, Ser 0.67 0.61 - 1.24 mg/dL   Calcium 7.8 (L) 8.9 - 10.3 mg/dL   GFR calc non Af Amer >60 >60 mL/min   GFR calc Af Amer >60 >60 mL/min   Anion gap 6 5 - 15    Comment: Performed at [facility], [address] [place], [place] [address]  CBC     Status: Abnormal   Collection Time: 02/04/18  3:41 AM  Result Value Ref Range   WBC 6.8 4.0 - 10.5 K/uL   RBC 4.13 (L) 4.22 - 5.81 MIL/uL   Hemoglobin 10.7 (L) 13.0 - 17.0 g/dL   HCT 33.0 (L) 39.0 - 52.0 %  MCV 79.9 (L) 80.0 - 100.0 fL   MCH 25.9 (L) 26.0 - 34.0 pg   MCHC 32.4 30.0 - 36.0 g/dL   RDW 15.5 11.5 - 15.5 %   Platelets 377 150 - 400 K/uL   nRBC 0.0 0.0 - 0.2 %    Comment: Performed at [facility], [address] [place], [place] [address]  Magnesium     Status: None   Collection Time: 02/04/18  3:41 AM  Result Value Ref Range   Magnesium 2.0 1.7 - 2.4 mg/dL    Comment: Performed at [facility], [address] [place], [place] [address]  ECHOCARDIOGRAM COMPLETE     Status: None   Collection Time: 02/04/18  8:07 AM  Result Value Ref Range   Weight 3,024 oz   Height 69 in   BP 97/67 mmHg  Basic metabolic panel     Status: Abnormal   Collection Time: 02/04/18  7:09 PM  Result Value Ref Range   Sodium 138 135 - 145 mmol/L   Potassium 3.6 3.5 - 5.1 mmol/L   Chloride 99 98 - 111 mmol/L   CO2 31 22 - 32 mmol/L   Glucose, Bld 125 (H) 70 - 99 mg/dL   BUN 13 8 - 23 mg/dL   Creatinine, Ser 1.03 0.61 - 1.24 mg/dL   Calcium 8.1 (L) 8.9 - 10.3 mg/dL   GFR calc non Af Amer >60 >60 mL/min   GFR calc Af Amer >60 >60 mL/min   Anion gap 8 5 - 15    Comment: Performed at [facility], [address] [place], [place] [address]  Magnesium     Status: None   Collection Time: 02/04/18  7:09 PM  Result Value Ref Range   Magnesium 1.9 1.7 - 2.4 mg/dL    Comment: Performed at [facility],  [address] [place], [place] [address]  Basic metabolic panel     Status: Abnormal   Collection Time: 02/05/18  3:30 AM  Result Value Ref Range   Sodium 135 135 - 145 mmol/L   Potassium 3.6 3.5 - 5.1 mmol/L   Chloride 100 98 - 111 mmol/L   CO2 28 22 - 32 mmol/L   Glucose, Bld 103 (H) 70 - 99 mg/dL   BUN 13 8 - 23 mg/dL   Creatinine, Ser 0.77 0.61 - 1.24 mg/dL   Calcium 7.8 (L) 8.9 - 10.3 mg/dL   GFR calc non Af Amer >60 >60 mL/min   GFR calc Af Amer >60 >60 mL/min   Anion gap 7 5 - 15    Comment: Performed at [facility], [address] [place],  [address]     PHQ2/9: Depression screen [facility] 2/9 03/14/2018 02/09/2018 12/08/2017 08/17/2017 05/11/2017  Decreased Interest 0 0 0 0 0  Down, Depressed, Hopeless 0 0 0 0 0  PHQ - 2 Score 0 0 0 0 0  Altered sleeping - - 0 - -  Tired, decreased energy - - 0 - -  Change in appetite - - 0 - -  Feeling bad or failure about yourself  - - 0 - -  Trouble concentrating - - 0 - -  Moving slowly or fidgety/restless - - 0 - -  Suicidal thoughts - - 0 - -  PHQ-9 Score - - 0 - -  Difficult doing work/chores - - Not difficult at all - -     Fall Risk: Fall Risk  03/14/2018 02/09/2018 12/14/2017 12/08/2017 08/17/2017  Falls in the past year? 1 0 1 0 No  Number falls in past yr: 0 0 0 0 -  Injury with Fall? 0 0 1 0 -     Functional Status Survey: Is the patient deaf or have difficulty hearing?: No Does the patient have difficulty seeing, even when wearing glasses/contacts?: Yes Does the patient have difficulty concentrating, remembering, or making decisions?: No Does the patient have difficulty walking or climbing stairs?: No Does the patient have difficulty dressing or bathing?: No Does the patient have difficulty doing errands alone such as visiting a doctor's office or shopping?: No    Assessment & Plan  1. History of alcoholism ([place])  He states he has not been drinking in months   2. Colon cancer screening  -  Cologuard  3. Rheumatoid nodule of upper arm (HCC)  Taking medication, under the care of Dr. Jefm Bryant  He brought his medications and has been taking two pills of plaquenil because pharmacy dispensed to bottles.   4. Centrilobular emphysema ([place])  Using Symbicort, he is under the care of Dr. Raul Del   5. Atrial fibrillation, unspecified type [facility])  He stopped Maxie Barb because of cost, we will place referral to chronic care management   6. Rheumatoid arthritis involving both hands with positive rheumatoid factor (HCC)  Keep follow up with Dr. Jefm Bryant.   7. Dyslipidemia   8. Vitamin B1 deficiency  Still taking supplements , we will recheck labs    9. Chronic congestive heart failure, unspecified heart failure type (HCC)  - aspirin EC 325 MG EC tablet; Take 1 tablet (325 mg total) by mouth daily.  Dispense: 90 tablet; Refill: 1  9. Chronic congestive heart failure, unspecified heart failure type (HCC)  - aspirin EC 325 MG EC tablet; Take 1 tablet (325 mg total) by mouth daily.  Dispense: 90 tablet; Refill: 1 - Referral to Chronic Care Management Services  10. Need for vaccination for pneumococcus  - Pneumococcal conjugate vaccine 13-valent IM

## 2018-03-18 ENCOUNTER — Ambulatory Visit: Payer: Self-pay | Admitting: Pharmacist

## 2018-03-18 DIAGNOSIS — I4891 Unspecified atrial fibrillation: Secondary | ICD-10-CM

## 2018-03-18 DIAGNOSIS — M06329 Rheumatoid nodule, unspecified elbow: Secondary | ICD-10-CM

## 2018-03-18 DIAGNOSIS — E785 Hyperlipidemia, unspecified: Secondary | ICD-10-CM

## 2018-03-18 DIAGNOSIS — F1021 Alcohol dependence, in remission: Secondary | ICD-10-CM

## 2018-03-18 DIAGNOSIS — M0630 Rheumatoid nodule, unspecified site: Secondary | ICD-10-CM

## 2018-03-18 DIAGNOSIS — I509 Heart failure, unspecified: Secondary | ICD-10-CM

## 2018-03-18 LAB — VITAMIN B1: Vitamin B1 (Thiamine): 44 nmol/L — ABNORMAL HIGH (ref 8–30)

## 2018-03-18 NOTE — Chronic Care Management (AMB) (Signed)
  Chronic Care Management   Note  03/18/2018 Name: JEOVANY HUITRON MRN: [id number] DOB: [birth year]  [name] is a 68 y.o. year old male who sees Steele Sizer, MD for primary care. Dr. Ancil Boozer asked the CCM team to consult the patient for assistance with chronic disease management related to medication managment. Referral was placed 03/14/18. Telephone outreach to patient today to introduce CCM services. HIPAA identifiers verified.  Mr. Ethington declined CCM services, stating "She (Dr. Ancil Boozer) got everything sorted out at the appointment".   Plan: Provided patient with contact information should he have questions or needs in the future. I will close out referral per protocol.   Ruben Reason, PharmD Clinical Pharmacist [facility] [phone]

## 2018-04-07 ENCOUNTER — Other Ambulatory Visit: Payer: Self-pay | Admitting: Family Medicine

## 2018-04-07 MED ORDER — METOPROLOL TARTRATE 25 MG PO TABS: 25.0000 mg | ORAL_TABLET | Freq: Two times a day (BID) | ORAL | 0 refills | Status: DC

## 2018-04-07 NOTE — Telephone Encounter (Signed)
Copied from [place] [id number]. Topic: Quick Communication - Rx Refill/Question >> Apr 07, 2018  9:24 AM Bea Graff, NT wrote: Medication: metoprolol tartrate (LOPRESSOR) 25 MG tablet and furosemide (LASIX) 40 MG tablet   Last filled while in the hospital   Has the patient contacted their pharmacy? Yes.   (Agent: If no, request that the patient contact the pharmacy for the refill.) (Agent: If yes, when and what did the pharmacy advise?)  Preferred Pharmacy (with phone number or street name): [id number] - [place], [place] - [address] [phone] (Phone) [phone] (Fax)    Agent: Please be advised that RX refills may take up to 3 business days. We ask that you follow-up with your pharmacy.

## 2018-04-08 ENCOUNTER — Telehealth: Payer: Self-pay | Admitting: Family Medicine

## 2018-04-14 ENCOUNTER — Ambulatory Visit (INDEPENDENT_AMBULATORY_CARE_PROVIDER_SITE_OTHER): Payer: Medicare Other

## 2018-04-14 ENCOUNTER — Other Ambulatory Visit: Payer: Self-pay

## 2018-04-14 VITALS — BP 112/72 | HR 74 | Temp 98.0°F | Resp 16 | Ht 69.0 in | Wt 190.0 lb

## 2018-04-14 DIAGNOSIS — Z0000 Encounter for general adult medical examination without abnormal findings: Secondary | ICD-10-CM

## 2018-04-14 DIAGNOSIS — Z1211 Encounter for screening for malignant neoplasm of colon: Secondary | ICD-10-CM

## 2018-04-14 DIAGNOSIS — Z0100 Encounter for examination of eyes and vision without abnormal findings: Secondary | ICD-10-CM

## 2018-04-14 NOTE — Progress Notes (Signed)
Subjective:   Jason Fitzgerald is a 68 y.o. male who presents for an Initial Medicare Annual Wellness Visit.  Review of Systems  Cardiac Risk Factors include: advanced age (>6mn, >>88women);hypertension;dyslipidemia;male gender    Objective:    Today's Vitals   04/14/18 0815  BP: 112/72  Pulse: 74  Resp: 16  Temp: 98 F (36.7 C)  TempSrc: Oral  SpO2: 95%  Weight: 190 lb (86.2 kg)  Height: 5' 9"  (1.753 m)   Body mass index is 28.06 kg/m.  Advanced Directives 04/14/2018 02/03/2018 02/03/2018 03/05/2017 03/04/2017 03/04/2017 10/15/2016  Does Patient Have a Medical Advance Directive? No No No No - No No  Would patient like information on creating a medical advance directive? No - Patient declined No - Patient declined - No - Patient declined No - Patient declined - -    Current Medications (verified) Outpatient Encounter Medications as of 04/14/2018  Medication Sig  . albuterol (PROVENTIL HFA) 108 (90 Base) MCG/ACT inhaler Inhale 2 puffs into the lungs as needed.  .Marland Kitchenamiodarone (PACERONE) 400 MG tablet Take 1 tablet (400 mg total) by mouth daily.  .Marland Kitchenaspirin EC 325 MG EC tablet Take 1 tablet (325 mg total) by mouth daily.  . budesonide-formoterol (SYMBICORT) 160-4.5 MCG/ACT inhaler Inhale 2 puffs into the lungs as needed.  . digoxin (LANOXIN) 0.125 MG tablet Take 1 tablet (0.125 mg total) by mouth daily.  . hydroxychloroquine (PLAQUENIL) 200 MG tablet Take 2 tablets by mouth daily.  .Marland Kitchenleflunomide (ARAVA) 10 MG tablet Take by mouth.  . metoprolol tartrate (LOPRESSOR) 25 MG tablet Take 1 tablet (25 mg total) by mouth 2 (two) times daily.  . predniSONE (DELTASONE) 5 MG tablet Take 1 tablet by mouth daily.  .Marland Kitchenspironolactone (ALDACTONE) 25 MG tablet Take 0.5 tablets (12.5 mg total) by mouth daily.  . Thiamine HCl (VITAMIN B1) 100 MG TABS TAKE 1 TABLET BY MOUTH EVERY DAY  . furosemide (LASIX) 40 MG tablet Take 1 tablet (40 mg total) by mouth daily. (Patient not taking: Reported on 04/14/2018)   . pantoprazole (PROTONIX) 40 MG tablet TAKE 1 TABLET (40 MG TOTAL) BY MOUTH ONCE DAILY.  .Marland Kitchenpotassium chloride SA (K-DUR,KLOR-CON) 20 MEQ tablet Take 1 tablet (20 mEq total) by mouth daily. (Patient not taking: Reported on 04/14/2018)   No facility-administered encounter medications on file as of 04/14/2018.     Allergies (verified) Patient has no known allergies.   History: Past Medical History:  Diagnosis Date  . CHF ([place]estive heart failure) ([place]   . Chronic cough   . COPD (chronic obstructive pulmonary disease) ([place]   . Elevated liver function tests   . Emphysema of lung ([facility]   . Fibrosis, pulmonary, interstitial, diffuse ([place]   . GERD (gastroesophageal reflux disease)   . History of cocaine abuse ([place]   . Mediastinal lymphadenopathy   . Pulmonary fibrosis ([place]   . Right inguinal hernia    Past Surgical History:  Procedure Laterality Date  . HERNIA REPAIR Right 1973   open  . INGUINAL HERNIA REPAIR Right 11/08/2014   Procedure: LAPAROSCOPIC RIGHT INGUINAL HERNIA REPAIR;  Surgeon: CHubbard Robinson MD;  Location: [facility] ORS;  Service: General;  Laterality: Right;  . knee arthroscopy Right 1972  . RIGHT/LEFT HEART CATH AND CORONARY ANGIOGRAPHY Right 03/05/2017   Procedure: RIGHT/LEFT HEART CATH AND CORONARY ANGIOGRAPHY;  Surgeon: KDionisio David MD;  [facility];  Service: Cardiovascular;  Laterality: Right;   Family History  Problem Relation  Age of Onset  . Diabetes Mother   . Cancer Father   . AAA (abdominal aortic aneurysm) Brother    Social History   Socioeconomic History  . Marital status: Divorced    Spouse name: Not on file  . Number of children: 4  . Years of education: Not on file  . Highest education level: Not on file  Occupational History  . Occupation: retired   Scientific laboratory technician  . Financial resource strain: Not hard at all  . Food insecurity:    Worry: Never true    Inability: Never true  . Transportation needs:    Medical: No     Non-medical: No  Tobacco Use  . Smoking status: Never Smoker  . Smokeless tobacco: Never Used  Substance and Sexual Activity  . Alcohol use: Not Currently    Alcohol/week: 0.0 standard drinks    Comment: 2 quarts a week  . Drug use: Not Currently    Types: Cocaine    Comment: as an young adult   . Sexual activity: Not Currently  Lifestyle  . Physical activity:    Days per week: 0 days    Minutes per session: 0 min  . Stress: Not at all  Relationships  . Social connections:    Talks on phone: More than three times a week    Gets together: More than three times a week    Attends religious service: Never    Active member of club or organization: No    Attends meetings of clubs or organizations: Never    Relationship status: Divorced  Other Topics Concern  . Not on file  Social History Narrative   Retired Summer of 2019   Lives alone   Tobacco Counseling Counseling given: Not Answered   Clinical Intake:  Pre-visit preparation completed: Yes  Pain : No/denies pain     BMI - recorded: 28.06 Nutritional Status: BMI 25 -29 Overweight Nutritional Risks: None Diabetes: No  How often do you need to have someone help you when you read instructions, pamphlets, or other written materials from your doctor or pharmacy?: 1 - Never  Interpreter Needed?: No  Information entered by :: Clemetine Marker LPN  Activities of Daily Living In your present state of health, do you have any difficulty performing the following activities: 04/14/2018 03/14/2018  Hearing? N N  Comment declines hearing aids -  Vision? Y Y  Comment glasses -  Difficulty concentrating or making decisions? Y N  Walking or climbing stairs? N N  Dressing or bathing? N N  Doing errands, shopping? N N  Preparing Food and eating ? N -  Using the Toilet? N -  In the past six months, have you accidently leaked urine? N -  Do you have problems with loss of bowel control? N -  Managing your Medications? N -   Managing your Finances? N -  Housekeeping or managing your Housekeeping? N -  Some recent data might be hidden     Immunizations and Health Maintenance Immunization History  Administered Date(s) Administered  . Influenza Split 01/07/2015  . Influenza, High Dose Seasonal PF 12/02/2017  . Influenza-Unspecified 11/27/2009, 11/06/2016  . Pneumococcal Conjugate-13 03/14/2018   Health Maintenance Due  Topic Date Due  . COLONOSCOPY  01/13/2002    Patient Care Team: Steele Sizer, MD as PCP - General (Family Medicine) Erby Pian, MD as Referring Physician (Specialist) Allyne Gee, MD as Consulting Physician (Internal Medicine)  Indicate any recent Medical Services you  may have received from other than [facility] providers in the past year (date may be approximate).    Assessment:   This is a routine wellness examination for [place].  Hearing/Vision screen Hearing Screening Comments: Pt denies hearing difficulty  Vision Screening Comments: Patient is past due for eye exam, requests referral to opthalmology  Dietary issues and exercise activities discussed: Current Exercise Habits: The patient does not participate in regular exercise at present, Exercise limited by: cardiac condition(s);respiratory conditions(s)  Goals    . DIET - INCREASE WATER INTAKE     Recommend to drink 6-8 glasses of water per day.       Depression Screen PHQ 2/9 Scores 04/14/2018 03/14/2018 02/09/2018 12/08/2017  PHQ - 2 Score 0 0 0 0  PHQ- 9 Score - - - 0    Fall Risk Fall Risk  04/14/2018 03/14/2018 02/09/2018 12/14/2017 12/08/2017  Falls in the past year? 1 1 0 1 0  Number falls in past yr: 1 0 0 0 0  Injury with Fall? 0 0 0 1 0  Risk for fall due to : History of fall(s) - - - -  Follow up Falls prevention discussed - - - -    FALL RISK PREVENTION PERTAINING TO THE HOME:  Any stairs in or around the home? Yes  If so, do they handrails? Yes   Home free of loose throw rugs in walkways, pet  beds, electrical cords, etc? Yes  Adequate lighting in your home to reduce risk of falls? Yes   ASSISTIVE DEVICES UTILIZED TO PREVENT FALLS:  Life alert? No  Use of a cane, walker or w/c? No  Grab bars in the bathroom? No  Shower chair or bench in shower? No  Elevated toilet seat or a handicapped toilet? Yes   DME ORDERS:  DME order needed?  No   TIMED UP AND GO:  Was the test performed? Yes .  Length of time to ambulate 10 feet: 5 sec.   GAIT:  Appearance of gait: Gait stead-fast and without the use of an assistive device.    Education: Fall risk prevention has been discussed.  Intervention(s) required? No   Cognitive Function:     6CIT Screen 04/14/2018  What Year? 0 points  What month? 0 points  What time? 0 points  Count back from 20 0 points  Months in reverse 4 points  Repeat phrase 4 points  Total Score 8    Screening Tests Health Maintenance  Topic Date Due  . COLONOSCOPY  01/13/2002  . TETANUS/TDAP  02/03/2019 (Originally 01/14/1970)  . PNA vac Low Risk Adult (2 of 2 - PPSV23) 03/15/2019  . INFLUENZA VACCINE  Completed  . Hepatitis C Screening  Completed    Qualifies for Shingles Vaccine? Yes  . Due for Shingrix. Education has been provided regarding the importance of this vaccine. Pt has been advised to call insurance company to determine out of pocket expense. Advised may also receive vaccine at local pharmacy or Health Dept. Verbalized acceptance and understanding.  Tdap: Although this vaccine is not a covered service during a Wellness Exam, does the patient still wish to receive this vaccine today?  No .  Education has been provided regarding the importance of this vaccine. Advised may receive this vaccine at local pharmacy or Health Dept. Aware to provide a copy of the vaccination record if obtained from local pharmacy or Health Dept. Verbalized acceptance and understanding.  Flu Vaccine: Up to date  Pneumococcal Vaccine: Up to date  Cancer  Screenings:  Colorectal Screening:  Referral to GI placed today. Pt aware the office will call re: appt.  Lung Cancer Screening: (Low Dose CT Chest recommended if Age 83-80 years, 30 pack-year currently smoking OR have quit w/in 15years.) does not qualify.   Additional Screening:  Hepatitis C Screening: does qualify; Completed 12/08/17  Vision Screening: Recommended annual ophthalmology exams for early detection of glaucoma and other disorders of the eye. Is the patient up to date with their annual eye exam?  No  Who is the provider or what is the name of the office in which the pt attends annual eye exams? No provider If pt is not established with a provider, would they like to be referred to a provider to establish care? Yes . Ophthalmology referral has been placed. Pt aware the office will call re: appt.  Dental Screening: Recommended annual dental exams for proper oral hygiene  Community Resource Referral:  CRR required this visit?  No      Plan:    I have personally reviewed and addressed the Medicare Annual Wellness questionnaire and have noted the following in the patient's chart:  A. Medical and social history B. Use of alcohol, tobacco or illicit drugs  C. Current medications and supplements D. Functional ability and status E.  Nutritional status F.  Physical activity G. Advance directives H. List of other physicians I.  Hospitalizations, surgeries, and ER visits in previous 12 months J.  [place] such as hearing and vision if needed, cognitive and depression L. Referrals and appointments   In addition, I have reviewed and discussed with patient certain preventive protocols, quality metrics, and best practice recommendations. A written personalized care plan for preventive services as well as general preventive health recommendations were provided to patient.   Signed,  Clemetine Marker, LPN Nurse Health Advisor   Nurse Notes: pt states he needs fluid pill  (furosemide) and potassium, both prescribed at hospital early January. He is also out of pantoprazole. Pt to follow up with Dr. Ancil Boozer next week. He also requested referral to GI for colonoscopy because he received Cologuard kit in the mail and does not want to complete and send off. Advised I would be happy to review it with him or have [employer] contact him for instructions but he declines.  Documentation completed post visit due to system wide Epic maintenance.

## 2018-04-14 NOTE — Patient Instructions (Signed)
Jason Fitzgerald , Thank you for taking time to come for your Medicare Wellness Visit. I appreciate your ongoing commitment to your health goals. Please review the following plan we discussed and let me know if I can assist you in the future.   Screening recommendations/referrals: Colonoscopy: referral sent today to gastroenterology for screening colonoscopy Recommended yearly ophthalmology/optometry visit for glaucoma screening and checkup Recommended yearly dental visit for hygiene and checkup  Vaccinations: Influenza vaccine: done 12/02/17 Pneumococcal vaccine: done 03/14/18 Tdap vaccine: due - please contact us if you get a cut or scrape Shingles vaccine: Shingrix discussed. Please contact your pharmacy for coverage information.   Advanced directives: Advance directive discussed with you today. Even though you declined this today please call our office should you change your mind and we can give you the proper paperwork for you to fill out.  Conditions/risks identified: Recommend drinking more water and increase physical activity.   Next appointment: Please follow up in one year for your Medicare Annual Wellness visit.    Preventive Care 81 Years and Older, Male Preventive care refers to lifestyle choices and visits with your health care provider that can promote health and wellness. What does preventive care include?  A yearly physical exam. This is also called an annual well check.  Dental exams once or twice a year.  Routine eye exams. Ask your health care provider how often you should have your eyes checked.  Personal lifestyle choices, including:  Daily care of your teeth and gums.  Regular physical activity.  Eating a healthy diet.  Avoiding tobacco and drug use.  Limiting alcohol use.  Practicing safe sex.  Taking low doses of aspirin every day.  Taking vitamin and mineral supplements as recommended by your health care provider. What happens during an annual well  check? The services and screenings done by your health care provider during your annual well check will depend on your age, overall health, lifestyle risk factors, and family history of disease. Counseling  Your health care provider may ask you questions about your:  Alcohol use.  Tobacco use.  Drug use.  Emotional well-being.  Home and relationship well-being.  Sexual activity.  Eating habits.  History of falls.  Memory and ability to understand (cognition).  Work and work Statistician. Screening  You may have the following tests or measurements:  Height, weight, and BMI.  Blood pressure.  Lipid and cholesterol levels. These may be checked every 5 years, or more frequently if you are over 22 years old.  Skin check.  Lung cancer screening. You may have this screening every year starting at age 21 if you have a 30-pack-year history of smoking and currently smoke or have quit within the past 15 years.  Fecal occult blood test (FOBT) of the stool. You may have this test every year starting at age 17.  Flexible sigmoidoscopy or colonoscopy. You may have a sigmoidoscopy every 5 years or a colonoscopy every 10 years starting at age 67.  Prostate cancer screening. Recommendations will vary depending on your family history and other risks.  Hepatitis C blood test.  Hepatitis B blood test.  Sexually transmitted disease (STD) testing.  Diabetes screening. This is done by checking your blood sugar (glucose) after you have not eaten for a while (fasting). You may have this done every 1-3 years.  Abdominal aortic aneurysm (AAA) screening. You may need this if you are a current or former smoker.  Osteoporosis. You may be screened starting at age 66 if  you are at high risk. Talk with your health care provider about your test results, treatment options, and if necessary, the need for more tests. Vaccines  Your health care provider may recommend certain vaccines, such as:   Influenza vaccine. This is recommended every year.  Tetanus, diphtheria, and acellular pertussis (Tdap, Td) vaccine. You may need a Td booster every 10 years.  Zoster vaccine. You may need this after age 22.  Pneumococcal 13-valent conjugate (PCV13) vaccine. One dose is recommended after age 39.  Pneumococcal polysaccharide (PPSV23) vaccine. One dose is recommended after age 39. Talk to your health care provider about which screenings and vaccines you need and how often you need them. This information is not intended to replace advice given to you by your health care provider. Make sure you discuss any questions you have with your health care provider. Document Released: 02/15/2015 Document Revised: 10/09/2015 Document Reviewed: 11/20/2014 Elsevier Interactive Patient Education  2017 [employer] Prevention in the Home Falls can cause injuries. They can happen to people of all ages. There are many things you can do to make your home safe and to help prevent falls. What can I do on the outside of my home?  Regularly fix the edges of walkways and driveways and fix any cracks.  Remove anything that might make you trip as you walk through a door, such as a raised step or threshold.  Trim any bushes or trees on the path to your home.  Use bright outdoor lighting.  Clear any walking paths of anything that might make someone trip, such as rocks or tools.  Regularly check to see if handrails are loose or broken. Make sure that both sides of any steps have handrails.  Any raised decks and porches should have guardrails on the edges.  Have any leaves, snow, or ice cleared regularly.  Use sand or salt on walking paths during winter.  Clean up any spills in your garage right away. This includes oil or grease spills. What can I do in the bathroom?  Use night lights.  Install grab bars by the toilet and in the tub and shower. Do not use towel bars as grab bars.  Use non-skid mats  or decals in the tub or shower.  If you need to sit down in the shower, use a plastic, non-slip stool.  Keep the floor dry. Clean up any water that spills on the floor as soon as it happens.  Remove soap buildup in the tub or shower regularly.  Attach bath mats securely with double-sided non-slip rug tape.  Do not have throw rugs and other things on the floor that can make you trip. What can I do in the bedroom?  Use night lights.  Make sure that you have a light by your bed that is easy to reach.  Do not use any sheets or blankets that are too big for your bed. They should not hang down onto the floor.  Have a firm chair that has side arms. You can use this for support while you get dressed.  Do not have throw rugs and other things on the floor that can make you trip. What can I do in the kitchen?  Clean up any spills right away.  Avoid walking on wet floors.  Keep items that you use a lot in easy-to-reach places.  If you need to reach something above you, use a strong step stool that has a grab bar.  Keep electrical cords  out of the way.  Do not use floor polish or wax that makes floors slippery. If you must use wax, use non-skid floor wax.  Do not have throw rugs and other things on the floor that can make you trip. What can I do with my stairs?  Do not leave any items on the stairs.  Make sure that there are handrails on both sides of the stairs and use them. Fix handrails that are broken or loose. Make sure that handrails are as long as the stairways.  Check any carpeting to make sure that it is firmly attached to the stairs. Fix any carpet that is loose or worn.  Avoid having throw rugs at the top or bottom of the stairs. If you do have throw rugs, attach them to the floor with carpet tape.  Make sure that you have a light switch at the top of the stairs and the bottom of the stairs. If you do not have them, ask someone to add them for you. What else can I do to  help prevent falls?  Wear shoes that:  Do not have high heels.  Have rubber bottoms.  Are comfortable and fit you well.  Are closed at the toe. Do not wear sandals.  If you use a stepladder:  Make sure that it is fully opened. Do not climb a closed stepladder.  Make sure that both sides of the stepladder are locked into place.  Ask someone to hold it for you, if possible.  Clearly mark and make sure that you can see:  Any grab bars or handrails.  First and last steps.  Where the edge of each step is.  Use tools that help you move around (mobility aids) if they are needed. These include:  Canes.  Walkers.  Scooters.  Crutches.  Turn on the lights when you go into a dark area. Replace any light bulbs as soon as they burn out.  Set up your furniture so you have a clear path. Avoid moving your furniture around.  If any of your floors are uneven, fix them.  If there are any pets around you, be aware of where they are.  Review your medicines with your doctor. Some medicines can make you feel dizzy. This can increase your chance of falling. Ask your doctor what other things that you can do to help prevent falls. This information is not intended to replace advice given to you by your health care provider. Make sure you discuss any questions you have with your health care provider. Document Released: 11/15/2008 Document Revised: 06/27/2015 Document Reviewed: 02/23/2014 Elsevier Interactive Patient Education  2017 [employer].

## 2018-04-15 ENCOUNTER — Telehealth: Payer: Self-pay

## 2018-04-15 NOTE — Telephone Encounter (Signed)
Contacted [facility] via in basket message to obtain clearance for colonoscopy.  She advised that I contact his cardiologist Dr.Shaukat Humphrey Rolls.  Contacted Dr. April Manson office and asked them to schedule patient an appt for cardiac clearance to have colonoscopy.  Thanks [employer]

## 2018-04-18 DIAGNOSIS — I5022 Chronic systolic (congestive) heart failure: Secondary | ICD-10-CM | POA: Diagnosis not present

## 2018-04-18 DIAGNOSIS — I480 Paroxysmal atrial fibrillation: Secondary | ICD-10-CM | POA: Diagnosis not present

## 2018-04-18 DIAGNOSIS — I509 Heart failure, unspecified: Secondary | ICD-10-CM | POA: Diagnosis not present

## 2018-04-18 DIAGNOSIS — I2510 Atherosclerotic heart disease of native coronary artery without angina pectoris: Secondary | ICD-10-CM | POA: Diagnosis not present

## 2018-04-19 ENCOUNTER — Telehealth: Payer: Self-pay | Admitting: Family Medicine

## 2018-04-19 NOTE — Telephone Encounter (Signed)
Received fax from [employer] [facility] on 04/15/2018 stating that they received an empty collection kit and that the patient will be contacted to initiate a new sample.  Dr. Ancil Boozer is aware.

## 2018-04-20 ENCOUNTER — Other Ambulatory Visit: Payer: Self-pay

## 2018-04-20 ENCOUNTER — Ambulatory Visit: Payer: Medicare Other | Admitting: Family Medicine

## 2018-04-29 ENCOUNTER — Other Ambulatory Visit: Payer: Self-pay | Admitting: Family Medicine

## 2018-04-29 DIAGNOSIS — E519 Thiamine deficiency, unspecified: Secondary | ICD-10-CM

## 2018-05-01 ENCOUNTER — Other Ambulatory Visit: Payer: Self-pay | Admitting: Family Medicine

## 2018-05-12 ENCOUNTER — Telehealth: Payer: Self-pay

## 2018-05-12 ENCOUNTER — Other Ambulatory Visit: Payer: Self-pay

## 2018-05-12 DIAGNOSIS — I509 Heart failure, unspecified: Secondary | ICD-10-CM | POA: Diagnosis not present

## 2018-05-12 NOTE — Telephone Encounter (Signed)
Cardiac Clearance request has been faxed to [place] office.  Once clearance has been received we will schedule patients colonoscopy.  Thanks [employer]

## 2018-05-17 ENCOUNTER — Other Ambulatory Visit (HOSPITAL_COMMUNITY): Payer: Self-pay | Admitting: Specialist

## 2018-05-17 ENCOUNTER — Other Ambulatory Visit: Payer: Self-pay | Admitting: Specialist

## 2018-05-17 DIAGNOSIS — R0609 Other forms of dyspnea: Secondary | ICD-10-CM | POA: Diagnosis not present

## 2018-05-17 DIAGNOSIS — R918 Other nonspecific abnormal finding of lung field: Secondary | ICD-10-CM | POA: Diagnosis not present

## 2018-05-17 DIAGNOSIS — J439 Emphysema, unspecified: Secondary | ICD-10-CM | POA: Diagnosis not present

## 2018-05-17 DIAGNOSIS — R0602 Shortness of breath: Secondary | ICD-10-CM | POA: Diagnosis not present

## 2018-05-17 DIAGNOSIS — J849 Interstitial pulmonary disease, unspecified: Secondary | ICD-10-CM

## 2018-05-24 DIAGNOSIS — I509 Heart failure, unspecified: Secondary | ICD-10-CM | POA: Diagnosis not present

## 2018-05-24 DIAGNOSIS — I4891 Unspecified atrial fibrillation: Secondary | ICD-10-CM | POA: Diagnosis not present

## 2018-05-24 DIAGNOSIS — I420 Dilated cardiomyopathy: Secondary | ICD-10-CM | POA: Diagnosis not present

## 2018-05-24 DIAGNOSIS — R0602 Shortness of breath: Secondary | ICD-10-CM | POA: Diagnosis not present

## 2018-05-24 DIAGNOSIS — I2510 Atherosclerotic heart disease of native coronary artery without angina pectoris: Secondary | ICD-10-CM | POA: Diagnosis not present

## 2018-05-30 DIAGNOSIS — I4891 Unspecified atrial fibrillation: Secondary | ICD-10-CM | POA: Diagnosis not present

## 2018-05-30 DIAGNOSIS — R079 Chest pain, unspecified: Secondary | ICD-10-CM | POA: Diagnosis not present

## 2018-05-30 DIAGNOSIS — I509 Heart failure, unspecified: Secondary | ICD-10-CM | POA: Diagnosis not present

## 2018-05-30 DIAGNOSIS — I10 Essential (primary) hypertension: Secondary | ICD-10-CM | POA: Diagnosis not present

## 2018-05-30 DIAGNOSIS — I420 Dilated cardiomyopathy: Secondary | ICD-10-CM | POA: Diagnosis not present

## 2018-06-02 DIAGNOSIS — I509 Heart failure, unspecified: Secondary | ICD-10-CM | POA: Diagnosis not present

## 2018-06-02 DIAGNOSIS — I4891 Unspecified atrial fibrillation: Secondary | ICD-10-CM | POA: Diagnosis not present

## 2018-06-02 DIAGNOSIS — I420 Dilated cardiomyopathy: Secondary | ICD-10-CM | POA: Diagnosis not present

## 2018-06-02 DIAGNOSIS — I10 Essential (primary) hypertension: Secondary | ICD-10-CM | POA: Diagnosis not present

## 2018-06-02 DIAGNOSIS — R079 Chest pain, unspecified: Secondary | ICD-10-CM | POA: Diagnosis not present

## 2018-06-09 DIAGNOSIS — M0579 Rheumatoid arthritis with rheumatoid factor of multiple sites without organ or systems involvement: Secondary | ICD-10-CM | POA: Diagnosis not present

## 2018-06-09 DIAGNOSIS — Z79899 Other long term (current) drug therapy: Secondary | ICD-10-CM | POA: Diagnosis not present

## 2018-06-10 ENCOUNTER — Ambulatory Visit (INDEPENDENT_AMBULATORY_CARE_PROVIDER_SITE_OTHER): Payer: Medicare Other | Admitting: Family Medicine

## 2018-06-10 ENCOUNTER — Other Ambulatory Visit: Payer: Self-pay

## 2018-06-10 ENCOUNTER — Encounter: Payer: Self-pay | Admitting: Family Medicine

## 2018-06-10 VITALS — BP 110/72 | HR 89 | Temp 98.1°F | Resp 16 | Ht 69.0 in | Wt 193.6 lb

## 2018-06-10 DIAGNOSIS — I509 Heart failure, unspecified: Secondary | ICD-10-CM | POA: Diagnosis not present

## 2018-06-10 DIAGNOSIS — G629 Polyneuropathy, unspecified: Secondary | ICD-10-CM

## 2018-06-10 DIAGNOSIS — M05741 Rheumatoid arthritis with rheumatoid factor of right hand without organ or systems involvement: Secondary | ICD-10-CM

## 2018-06-10 DIAGNOSIS — F1021 Alcohol dependence, in remission: Secondary | ICD-10-CM

## 2018-06-10 DIAGNOSIS — M05742 Rheumatoid arthritis with rheumatoid factor of left hand without organ or systems involvement: Secondary | ICD-10-CM

## 2018-06-10 DIAGNOSIS — E519 Thiamine deficiency, unspecified: Secondary | ICD-10-CM | POA: Diagnosis not present

## 2018-06-10 DIAGNOSIS — J8410 Pulmonary fibrosis, unspecified: Secondary | ICD-10-CM

## 2018-06-10 DIAGNOSIS — I2510 Atherosclerotic heart disease of native coronary artery without angina pectoris: Secondary | ICD-10-CM

## 2018-06-10 DIAGNOSIS — I4891 Unspecified atrial fibrillation: Secondary | ICD-10-CM

## 2018-06-10 DIAGNOSIS — N62 Hypertrophy of breast: Secondary | ICD-10-CM

## 2018-06-10 MED ORDER — METOPROLOL TARTRATE 25 MG PO TABS: 25.0000 mg | ORAL_TABLET | Freq: Two times a day (BID) | ORAL | 3 refills | Status: DC

## 2018-06-10 MED ORDER — GABAPENTIN 300 MG PO CAPS: 300.0000 mg | ORAL_CAPSULE | Freq: Two times a day (BID) | ORAL | 3 refills | Status: DC

## 2018-06-10 MED ORDER — ATORVASTATIN CALCIUM 10 MG PO TABS: 10.0000 mg | ORAL_TABLET | Freq: Every day | ORAL | 3 refills | Status: DC

## 2018-06-10 MED ORDER — VITAMIN B-1 100 MG PO TABS: 100.0000 mg | ORAL_TABLET | ORAL | 1 refills | Status: DC

## 2018-06-10 NOTE — Progress Notes (Signed)
Name: Jason Fitzgerald   MRN: [id number]    DOB: 1950-09-13   Date:06/10/2018       Progress Note  Subjective  Chief Complaint  Chief Complaint  Patient presents with  . Medication Refill    Needs refill of Lasix  . History of alcoholism  . Hypertension  . Dizziness    In the mornings    HPI  History of alcoholism: he states he quit drinking a couple of months ago, taking Vitamin B1 and last level was high , currently down to three times weekly and we will recheck next visit   HTN/CHF and Afib: rate controlled with beta-blocker, amiodarone, also taking aldactone, off lasix and  potassium. Denies SOB, palpitation  or orthopnea, he gets dizzy in am's occasionally but very brief and resolves when he sits down ( discussed getting up slowly to avoid fall) . He states he has been compliant with medication. Off Xarelto and Entresto because of cost of medication, he is on aspirin 325 mg, discussed again risk of bleeding since he takes prednisone daily.   RA: under the care of Dr. Jefm Bryant. He has been taking Plaquenil 400 mg daily, prednisone and Arava , denies side effects of medication, he states pain is under control at this time  Malnutrition: doing better, eating more now that he stopped drinking , gained almost 10 lbs in the past few months  CAD: he states he has been told he had a heart attack, not on statin, normal LDL, but we will add low dose atorvastatin, we will also try to review records from Dr. Humphrey Rolls, denies chest pain   Pulmonary fiboris : never smoked, states his mother did and also co-workers, under the care of Dr. Raul Del, has daily cough but no wheezing , he denies current SOB, CT chest done 2019 showed bilateral interstitial lung opacities with pulmonary fibrosis.   Neuropathy: on both feet, he states gabapentin has helped left leg more than right , we will try higher dose of gabapentin from 300 mg daily to BID and monitor.   Gynecomastia: discussed imaging, going on for  months, it may be from medications, history of alcoholism. He would like to hold off on further testing at this time              Patient Active Problem List   Diagnosis Date Noted  . History of alcoholism ([place]) 12/08/2017  . Rheumatoid arthritis involving multiple sites with positive rheumatoid factor ([place]) 10/13/2017  . Atrial fibrillation ([place]) 03/12/2017  . Arthritis of left knee 08/01/2016  . 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 Chronic cough 05/29/2015  . CAFL (chronic airflow limitation) ([place]) 10/24/2014  . Arteriosclerosis of coronary artery 10/24/2014  . History of fundoplication 21/30/8657  . BP (high blood pressure) 10/24/2014  . LAD (lymphadenopathy), mediastinal 07/13/2013  . Interstitial lung disease ([place]) 06/19/2013  . 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 HERNIA REPAIR Right 1973   open  . INGUINAL HERNIA REPAIR Right 11/08/2014   Procedure: LAPAROSCOPIC RIGHT INGUINAL HERNIA REPAIR;  Surgeon: Hubbard Robinson, MD;  Location: [facility];  Service: General;  Laterality: Right;  . knee arthroscopy Right 1972  . RIGHT/LEFT HEART CATH AND CORONARY ANGIOGRAPHY Right 03/05/2017   Procedure: RIGHT/LEFT HEART CATH AND CORONARY ANGIOGRAPHY;  Surgeon: Dionisio David, MD;  Location: [facility];  Service: Cardiovascular;  Laterality: Right;    Family History  Problem Relation  Age of Onset  . Diabetes Mother   . Cancer Father   . AAA (abdominal aortic aneurysm) Brother     Social History   Socioeconomic History  . Marital status: Divorced    Spouse name: Not on file  . Number of children: 4  . Years of education: Not on file  . Highest education level: Not on file  Occupational History  . Occupation: retired   Scientific laboratory technician  . Financial resource strain: Not hard at all  . Food insecurity:    Worry: Never true    Inability: Never true  . Transportation needs:     Medical: No    Non-medical: No  Tobacco Use  . Smoking status: Never Smoker  . Smokeless tobacco: Never Used  Substance and Sexual Activity  . Alcohol use: Not Currently    Alcohol/week: 0.0 standard drinks    Comment: 2 quarts a week  . Drug use: Not Currently    Types: Cocaine    Comment: as an young adult   . Sexual activity: Not Currently  Lifestyle  . Physical activity:    Days per week: 0 days    Minutes per session: 0 min  . Stress: Not at all  Relationships  . Social connections:    Talks on phone: More than three times a week    Gets together: More than three times a week    Attends religious service: Never    Active member of club or organization: No    Attends meetings of clubs or organizations: Never    Relationship status: Divorced  . Intimate partner violence:    Fear of current or ex partner: No    Emotionally abused: No    Physically abused: No    Forced sexual activity: No  Other Topics Concern  . Not on file  Social History Narrative   Retired Summer of 2019   Lives alone     Current Outpatient Medications:  .  albuterol (PROVENTIL HFA) 108 (90 Base) MCG/ACT inhaler, Inhale 2 puffs into the lungs as needed., Disp: 1 Inhaler, Rfl: 2 .  amiodarone (PACERONE) 200 MG tablet, Take 2 tablets by mouth daily., Disp: , Rfl:  .  aspirin EC 325 MG EC tablet, Take 1 tablet (325 mg total) by mouth daily., Disp: 90 tablet, Rfl: 1 .  budesonide-formoterol (SYMBICORT) 160-4.5 MCG/ACT inhaler, Inhale 2 puffs into the lungs as needed., Disp: , Rfl:  .  digoxin (LANOXIN) 0.125 MG tablet, Take 1 tablet (0.125 mg total) by mouth daily., Disp: 30 tablet, Rfl: 0 .  gabapentin (NEURONTIN) 300 MG capsule, Take 1 capsule (300 mg total) by mouth 2 (two) times daily., Disp: 60 capsule, Rfl: 3 .  hydroxychloroquine (PLAQUENIL) 200 MG tablet, Take 2 tablets by mouth daily., Disp: , Rfl: 1 .  leflunomide (ARAVA) 10 MG tablet, Take by mouth., Disp: , Rfl:  .  metoprolol tartrate  (LOPRESSOR) 25 MG tablet, Take 1 tablet (25 mg total) by mouth 2 (two) times daily., Disp: 60 tablet, Rfl: 3 .  potassium chloride SA (K-DUR,KLOR-CON) 20 MEQ tablet, Take 1 tablet (20 mEq total) by mouth daily., Disp: 30 tablet, Rfl: 1 .  predniSONE (DELTASONE) 5 MG tablet, Take 1 tablet by mouth daily., Disp: , Rfl:  .  spironolactone (ALDACTONE) 25 MG tablet, Take 0.5 tablets (12.5 mg total) by mouth daily., Disp: 45 tablet, Rfl: 3 .  valsartan (DIOVAN) 80 MG tablet, Take 80 mg by mouth daily., Disp: , Rfl:  .  atorvastatin (LIPITOR) 10 MG tablet, Take 1 tablet (10 mg total) by mouth daily., Disp: 30 tablet, Rfl: 3 .  thiamine (VITAMIN B-1) 100 MG tablet, Take 1 tablet (100 mg total) by mouth 3 (three) times a week., Disp: 12 tablet, Rfl: 1  No Known Allergies  I personally reviewed active problem list, medication list, allergies, family history, social history with the patient/caregiver today.   ROS  Constitutional: Negative for fever , positive for weight change - gain  Respiratory: positive  for cough and shortness of breath.   Cardiovascular: Negative for chest pain or palpitations.  Gastrointestinal: Negative for abdominal pain, no bowel changes.  Musculoskeletal: Negative for gait problem or joint swelling.  Skin: Negative for rash.  Neurological: positive  for dizziness but no  headache.  No other specific complaints in a complete review of systems (except as listed in HPI above).  Objective  Vitals:   06/10/18 0906  BP: 110/72  Pulse: 89  Resp: 16  Temp: 98.1 F (36.7 C)  TempSrc: Oral  SpO2: 97%  Weight: 193 lb 9.6 oz (87.8 kg)  Height: 5\' 9"  (1.753 m)    Body mass index is 28.59 kg/m.  Physical Exam  Constitutional: Patient appears well-developed and thin. No distress.  HEENT: head atraumatic, normocephalic, pupils equal and reactive to light, neck supple, oral mucosa not examined ( wearing a mask)  Cardiovascular: Normal rate, regular rhythm and normal heart  sounds.  No murmur heard. No BLE edema. Pulmonary/Chest: Effort normal and breath sounds normal. No respiratory distress. Abdominal: Soft.  There is no tenderness. Psychiatric: Patient has a normal mood and affect. behavior is normal. Judgment and thought content normal. Breast: both sides, right larger than left, non tender or lumps  Muscular skeletal: deformity of right 5th finger, no effusions  Recent Results (from the past 2160 hour(s))  Vitamin B1     Status: Abnormal   Collection Time: 03/14/18  2:51 PM  Result Value Ref Range   Vitamin B1 (Thiamine) 44 (H) 8 - 30 nmol/L    Comment: Marland Kitchen Vitamin supplementation within 24 hours prior to blood draw may affect the accuracy of the results. . This test was developed and its analytical performance characteristics have been determined by [place], [place]. It has not been cleared or approved by the U.S. Food and Drug Administration. This assay has been validated pursuant to the CLIA regulations and is used for clinical purposes. .      PHQ2/9: Depression screen [facility] 2/9 06/10/2018 04/14/2018 03/14/2018 02/09/2018 12/08/2017  Decreased Interest 0 0 0 0 0  Down, Depressed, Hopeless 0 0 0 0 0  PHQ - 2 Score 0 0 0 0 0  Altered sleeping 0 - - - 0  Tired, decreased energy 0 - - - 0  Change in appetite 0 - - - 0  Feeling bad or failure about yourself  0 - - - 0  Trouble concentrating 0 - - - 0  Moving slowly or fidgety/restless 1 - - - 0  Suicidal thoughts 0 - - - 0  PHQ-9 Score 1 - - - 0  Difficult doing work/chores Not difficult at all - - - Not difficult at all    phq 9 is negative   Fall Risk: Fall Risk  06/10/2018 04/14/2018 03/14/2018 02/09/2018 12/14/2017  Falls in the past year? 0 1 1 0 1  Number falls in past yr: 0 1 0 0 0  Injury with Fall? 0 0 0 0  1  Risk for fall due to : - History of fall(s) - - -  Follow up - Falls prevention discussed - - -     Functional Status Survey: Is the patient  deaf or have difficulty hearing?: No Does the patient have difficulty seeing, even when wearing glasses/contacts?: Yes Does the patient have difficulty concentrating, remembering, or making decisions?: No Does the patient have difficulty walking or climbing stairs?: No Does the patient have difficulty dressing or bathing?: No Does the patient have difficulty doing errands alone such as visiting a doctor's office or shopping?: No   Assessment & Plan  1. Atrial fibrillation, unspecified type (HCC)  - metoprolol tartrate (LOPRESSOR) 25 MG tablet; Take 1 tablet (25 mg total) by mouth 2 (two) times daily.  Dispense: 60 tablet; Refill: 3  2. Chronic congestive heart failure, unspecified heart failure type (HCC)  - metoprolol tartrate (LOPRESSOR) 25 MG tablet; Take 1 tablet (25 mg total) by mouth 2 (two) times daily.  Dispense: 60 tablet; Refill: 3  3. History of alcoholism ([place])  He quit drinking   4. Centrilobular emphysema (HCC)  Under the care of Dr. Raul Del   5. Vitamin B1 deficiency  - thiamine (VITAMIN B-1) 100 MG tablet; Take 1 tablet (100 mg total) by mouth 3 (three) times a week.  Dispense: 12 tablet; Refill: 1  6. Rheumatoid arthritis involving both hands with positive rheumatoid factor (HCC)  Keep follow up with Dr. Jefm Bryant   7. Neuropathy  - gabapentin (NEURONTIN) 300 MG capsule; Take 1 capsule (300 mg total) by mouth 2 (two) times daily.  Dispense: 60 capsule; Refill: 3  8. Coronary artery disease involving native coronary artery of native heart without angina pectoris  - atorvastatin (LIPITOR) 10 MG tablet; Take 1 tablet (10 mg total) by mouth daily.  Dispense: 30 tablet; Refill: 3   9. Gynecomastia, male  Discussed imaging or labs, he states it is okay to monitor for now

## 2018-06-14 ENCOUNTER — Ambulatory Visit: Payer: PRIVATE HEALTH INSURANCE | Admitting: Family

## 2018-06-21 DIAGNOSIS — M0579 Rheumatoid arthritis with rheumatoid factor of multiple sites without organ or systems involvement: Secondary | ICD-10-CM | POA: Diagnosis not present

## 2018-06-21 DIAGNOSIS — I5032 Chronic diastolic (congestive) heart failure: Secondary | ICD-10-CM | POA: Diagnosis not present

## 2018-06-21 DIAGNOSIS — J84112 Idiopathic pulmonary fibrosis: Secondary | ICD-10-CM | POA: Diagnosis not present

## 2018-06-25 ENCOUNTER — Other Ambulatory Visit: Payer: Self-pay | Admitting: Family

## 2018-06-28 ENCOUNTER — Other Ambulatory Visit: Payer: Self-pay

## 2018-06-28 MED ORDER — SPIRONOLACTONE 25 MG PO TABS: 12.5000 mg | ORAL_TABLET | Freq: Every day | ORAL | 0 refills | Status: DC

## 2018-06-30 NOTE — Telephone Encounter (Signed)
closing

## 2018-07-06 DIAGNOSIS — M069 Rheumatoid arthritis, unspecified: Secondary | ICD-10-CM | POA: Diagnosis not present

## 2018-07-06 DIAGNOSIS — Z79899 Other long term (current) drug therapy: Secondary | ICD-10-CM | POA: Diagnosis not present

## 2018-07-12 DIAGNOSIS — M722 Plantar fascial fibromatosis: Secondary | ICD-10-CM | POA: Diagnosis not present

## 2018-07-12 DIAGNOSIS — M79671 Pain in right foot: Secondary | ICD-10-CM | POA: Diagnosis not present

## 2018-07-12 DIAGNOSIS — M779 Enthesopathy, unspecified: Secondary | ICD-10-CM | POA: Diagnosis not present

## 2018-07-12 DIAGNOSIS — M79672 Pain in left foot: Secondary | ICD-10-CM | POA: Diagnosis not present

## 2018-07-14 DIAGNOSIS — I4891 Unspecified atrial fibrillation: Secondary | ICD-10-CM | POA: Diagnosis not present

## 2018-07-14 DIAGNOSIS — I480 Paroxysmal atrial fibrillation: Secondary | ICD-10-CM | POA: Diagnosis not present

## 2018-07-14 DIAGNOSIS — I5022 Chronic systolic (congestive) heart failure: Secondary | ICD-10-CM | POA: Diagnosis not present

## 2018-07-14 DIAGNOSIS — I2510 Atherosclerotic heart disease of native coronary artery without angina pectoris: Secondary | ICD-10-CM | POA: Diagnosis not present

## 2018-07-14 DIAGNOSIS — I509 Heart failure, unspecified: Secondary | ICD-10-CM | POA: Diagnosis not present

## 2018-08-02 ENCOUNTER — Telehealth: Payer: Self-pay

## 2018-08-02 ENCOUNTER — Other Ambulatory Visit: Payer: Self-pay

## 2018-08-02 DIAGNOSIS — Z1211 Encounter for screening for malignant neoplasm of colon: Secondary | ICD-10-CM

## 2018-08-02 NOTE — Telephone Encounter (Signed)
Gastroenterology Pre-Procedure Review  Request Date: 08/19/18 Requesting Physician: Dr. Bonna Gains  PATIENT REVIEW QUESTIONS: The patient responded to the following health history questions as indicated:    1. Are you having any GI issues? no 2. Do you have a personal history of Polyps? no 3. Do you have a family history of Colon Cancer or Polyps? no 4. Diabetes Mellitus? no 5. Joint replacements in the past 12 months?no 6. Major health problems in the past 3 months?no 7. Any artificial heart valves, MVP, or defibrillator?no    MEDICATIONS & ALLERGIES:    Patient reports the following regarding taking any anticoagulation/antiplatelet therapy:   Plavix, Coumadin, Eliquis, Xarelto, Lovenox, Pradaxa, Brilinta, or Effient? no Aspirin? yes (Aspirin 325mg  Blood Thinner request sent to PCP)  Patient confirms/reports the following medications:  Current Outpatient Medications  Medication Sig Dispense Refill  . albuterol (PROVENTIL HFA) 108 (90 Base) MCG/ACT inhaler Inhale 2 puffs into the lungs as needed. 1 Inhaler 2  . amiodarone (PACERONE) 200 MG tablet Take 2 tablets by mouth daily.    Marland Kitchen aspirin EC 325 MG EC tablet Take 1 tablet (325 mg total) by mouth daily. 90 tablet 1  . atorvastatin (LIPITOR) 10 MG tablet Take 1 tablet (10 mg total) by mouth daily. 30 tablet 3  . budesonide-formoterol (SYMBICORT) 160-4.5 MCG/ACT inhaler Inhale 2 puffs into the lungs as needed.    . digoxin (LANOXIN) 0.125 MG tablet Take 1 tablet (0.125 mg total) by mouth daily. 30 tablet 0  . gabapentin (NEURONTIN) 300 MG capsule Take 1 capsule (300 mg total) by mouth 2 (two) times daily. 60 capsule 3  . hydroxychloroquine (PLAQUENIL) 200 MG tablet Take 2 tablets by mouth daily.  1  . leflunomide (ARAVA) 10 MG tablet Take by mouth.    . metoprolol tartrate (LOPRESSOR) 25 MG tablet Take 1 tablet (25 mg total) by mouth 2 (two) times daily. 60 tablet 3  . potassium chloride SA (K-DUR,KLOR-CON) 20 MEQ tablet Take 1 tablet  (20 mEq total) by mouth daily. 30 tablet 1  . predniSONE (DELTASONE) 5 MG tablet Take 1 tablet by mouth daily.    Marland Kitchen spironolactone (ALDACTONE) 25 MG tablet Take 0.5 tablets (12.5 mg total) by mouth daily. 45 tablet 0  . thiamine (VITAMIN B-1) 100 MG tablet Take 1 tablet (100 mg total) by mouth 3 (three) times a week. 12 tablet 1  . valsartan (DIOVAN) 80 MG tablet Take 80 mg by mouth daily.     No current facility-administered medications for this visit.     Patient confirms/reports the following allergies:  No Known Allergies  No orders of the defined types were placed in this encounter.   AUTHORIZATION INFORMATION Primary Insurance: 1D#: Group #:  Secondary Insurance: 1D#: Group #:  SCHEDULE INFORMATION: Date: 08/19/18 Time: Location:[facility]

## 2018-08-15 DIAGNOSIS — G5791 Unspecified mononeuropathy of right lower limb: Secondary | ICD-10-CM | POA: Diagnosis not present

## 2018-08-15 DIAGNOSIS — M779 Enthesopathy, unspecified: Secondary | ICD-10-CM | POA: Diagnosis not present

## 2018-08-15 DIAGNOSIS — G5792 Unspecified mononeuropathy of left lower limb: Secondary | ICD-10-CM | POA: Diagnosis not present

## 2018-08-16 ENCOUNTER — Other Ambulatory Visit
Admission: RE | Admit: 2018-08-16 | Discharge: 2018-08-16 | Disposition: A | Discharge status: Home / Self Care | Payer: Medicare Other | Source: Ambulatory Visit | Attending: Gastroenterology | Admitting: Gastroenterology

## 2018-08-16 ENCOUNTER — Other Ambulatory Visit: Payer: Self-pay

## 2018-08-16 DIAGNOSIS — Z1159 Encounter for screening for other viral diseases: Secondary | ICD-10-CM | POA: Insufficient documentation

## 2018-08-16 LAB — SARS CORONAVIRUS 2 (TAT 6-24 HRS): SARS Coronavirus 2: NEGATIVE

## 2018-08-19 ENCOUNTER — Encounter
Admission: RE | Disposition: A | Discharge status: Home / Self Care | Payer: Self-pay | Source: Home / Self Care | Attending: Gastroenterology

## 2018-08-19 ENCOUNTER — Ambulatory Visit
Admission: RE | Admit: 2018-08-19 | Discharge: 2018-08-19 | Disposition: A | Discharge status: Home / Self Care | Payer: Medicare Other | Attending: Gastroenterology | Admitting: Gastroenterology

## 2018-08-19 ENCOUNTER — Encounter: Payer: Self-pay | Admitting: Anesthesiology

## 2018-08-19 ENCOUNTER — Ambulatory Visit: Payer: Medicare Other | Admitting: Anesthesiology

## 2018-08-19 ENCOUNTER — Other Ambulatory Visit: Payer: Self-pay

## 2018-08-19 DIAGNOSIS — K219 Gastro-esophageal reflux disease without esophagitis: Secondary | ICD-10-CM | POA: Insufficient documentation

## 2018-08-19 DIAGNOSIS — Z515 Encounter for palliative care: Secondary | ICD-10-CM

## 2018-08-19 DIAGNOSIS — I110 Hypertensive heart disease with heart failure: Secondary | ICD-10-CM | POA: Diagnosis not present

## 2018-08-19 DIAGNOSIS — J449 Chronic obstructive pulmonary disease, unspecified: Secondary | ICD-10-CM | POA: Diagnosis not present

## 2018-08-19 DIAGNOSIS — J8410 Pulmonary fibrosis, unspecified: Secondary | ICD-10-CM | POA: Insufficient documentation

## 2018-08-19 DIAGNOSIS — Z7952 Long term (current) use of systemic steroids: Secondary | ICD-10-CM | POA: Insufficient documentation

## 2018-08-19 DIAGNOSIS — Z1211 Encounter for screening for malignant neoplasm of colon: Secondary | ICD-10-CM | POA: Insufficient documentation

## 2018-08-19 DIAGNOSIS — Z79899 Other long term (current) drug therapy: Secondary | ICD-10-CM | POA: Diagnosis not present

## 2018-08-19 DIAGNOSIS — K648 Other hemorrhoids: Secondary | ICD-10-CM

## 2018-08-19 DIAGNOSIS — D123 Benign neoplasm of transverse colon: Secondary | ICD-10-CM | POA: Insufficient documentation

## 2018-08-19 DIAGNOSIS — Z7951 Long term (current) use of inhaled steroids: Secondary | ICD-10-CM | POA: Diagnosis not present

## 2018-08-19 DIAGNOSIS — Z7689 Persons encountering health services in other specified circumstances: Secondary | ICD-10-CM | POA: Diagnosis not present

## 2018-08-19 DIAGNOSIS — M1990 Unspecified osteoarthritis, unspecified site: Secondary | ICD-10-CM | POA: Insufficient documentation

## 2018-08-19 DIAGNOSIS — I6350 Cerebral infarction due to unspecified occlusion or stenosis of unspecified cerebral artery: Secondary | ICD-10-CM | POA: Diagnosis not present

## 2018-08-19 DIAGNOSIS — Z7982 Long term (current) use of aspirin: Secondary | ICD-10-CM | POA: Diagnosis not present

## 2018-08-19 DIAGNOSIS — K635 Polyp of colon: Secondary | ICD-10-CM

## 2018-08-19 DIAGNOSIS — I509 Heart failure, unspecified: Secondary | ICD-10-CM | POA: Diagnosis not present

## 2018-08-19 DIAGNOSIS — D122 Benign neoplasm of ascending colon: Secondary | ICD-10-CM | POA: Diagnosis not present

## 2018-08-19 HISTORY — PX: COLONOSCOPY WITH PROPOFOL: SHX5780

## 2018-08-19 SURGERY — COLONOSCOPY WITH PROPOFOL
Anesthesia: General

## 2018-08-19 MED ORDER — GLYCOPYRROLATE 0.2 MG/ML IJ SOLN
INTRAMUSCULAR | Status: DC | PRN
  Administered 2018-08-19: 0.2 mg via INTRAVENOUS

## 2018-08-19 MED ORDER — PROPOFOL 10 MG/ML IV BOLUS
INTRAVENOUS | Status: DC | PRN
  Administered 2018-08-19: 50 mg via INTRAVENOUS

## 2018-08-19 MED ORDER — SODIUM CHLORIDE 0.9 % IV SOLN
INTRAVENOUS | Status: DC
  Administered 2018-08-19: 08:00:00 1000 mL via INTRAVENOUS

## 2018-08-19 MED ORDER — PROPOFOL 500 MG/50ML IV EMUL
INTRAVENOUS | Status: DC | PRN
  Administered 2018-08-19: 125 ug/kg/min via INTRAVENOUS

## 2018-08-19 MED ORDER — PROPOFOL 500 MG/50ML IV EMUL
INTRAVENOUS | Status: AC
  Filled 2018-08-19: qty 50

## 2018-08-19 NOTE — Anesthesia Post-op Follow-up Note (Signed)
Anesthesia QCDR form completed.        

## 2018-08-19 NOTE — Transfer of Care (Signed)
Immediate Anesthesia Transfer of Care Note  Patient: Jason Fitzgerald  Procedure(s) Performed: COLONOSCOPY WITH PROPOFOL (N/A )  Patient Location: PACU  Anesthesia Type:General  Level of Consciousness: sedated  Airway & Oxygen Therapy: Patient Spontanous Breathing and Patient connected to nasal cannula oxygen  Post-op Assessment: Report given to RN and Post -op Vital signs reviewed and stable  Post vital signs: Reviewed and stable  Last Vitals:  Vitals Value Taken Time  BP 95/73 08/19/18 0855  Temp    Pulse 76 08/19/18 0855  Resp 25 08/19/18 0855  SpO2 99 % 08/19/18 0855  Vitals shown include unvalidated device data.  Last Pain:  Vitals:   08/19/18 0724  TempSrc: Oral  PainSc: 0-No pain         Complications: No apparent anesthesia complications

## 2018-08-19 NOTE — H&P (Signed)
Jason Antigua, MD [address] [place], [place], [place], [address] [address], [place], [place], [place], [address] Phone: [phone]  Fax: [phone]  Primary Care Physician:  Steele Sizer, MD   Pre-Procedure History & Physical: HPI:  Jason Fitzgerald is a 68 y.o. male is here for a colonoscopy.   Past Medical History:  Diagnosis Date  . CHF (congestive heart failure) ([place])   . Chronic cough   . COPD (chronic obstructive pulmonary disease) ([place])   . Elevated liver function tests   . Emphysema of lung ([place])   . Fibrosis, pulmonary, interstitial, diffuse ([place])   . GERD (gastroesophageal reflux disease)   . History of cocaine abuse ([place])   . Mediastinal lymphadenopathy   . Pulmonary fibrosis ([place])   . Right inguinal hernia     Past Surgical History:  Procedure Laterality Date  . HERNIA REPAIR Right 1973   open  . INGUINAL HERNIA REPAIR Right 11/08/2014   Procedure: LAPAROSCOPIC RIGHT INGUINAL HERNIA REPAIR;  Surgeon: Hubbard Robinson, MD;  Location: [facility];  Service: General;  Laterality: Right;  . knee arthroscopy Right 1972  . RIGHT/LEFT HEART CATH AND CORONARY ANGIOGRAPHY Right 03/05/2017   Procedure: RIGHT/LEFT HEART CATH AND CORONARY ANGIOGRAPHY;  Surgeon: Dionisio David, MD;  Location: [facility];  Service: Cardiovascular;  Laterality: Right;    Prior to Admission medications   Medication Sig Start Date End Date Taking? Authorizing Provider  albuterol (PROVENTIL HFA) 108 (90 Base) MCG/ACT inhaler Inhale 2 puffs into the lungs as needed. 02/05/18 12/18/19 Yes Demetrios Loll, MD  amiodarone (PACERONE) 200 MG tablet Take 2 tablets by mouth daily. 05/19/18  Yes Allyne Gee, MD  aspirin EC 325 MG EC tablet Take 1 tablet (325 mg total) by mouth daily. 03/14/18  Yes Sowles, Drue Stager, MD  atorvastatin (LIPITOR) 10 MG tablet Take 1 tablet (10 mg total) by mouth daily. 06/10/18  Yes Sowles, Drue Stager, MD  budesonide-formoterol [facility]) 160-4.5 MCG/ACT  inhaler Inhale 2 puffs into the lungs as needed.   Yes [provider]  digoxin (LANOXIN) 0.125 MG tablet Take 1 tablet (0.125 mg total) by mouth daily. 03/06/17  Yes Epifanio Lesches, MD  gabapentin (NEURONTIN) 300 MG capsule Take 1 capsule (300 mg total) by mouth 2 (two) times daily. 06/10/18  Yes Sowles, Drue Stager, MD  hydroxychloroquine (PLAQUENIL) 200 MG tablet Take 2 tablets by mouth daily. 01/23/17  Yes [provider]  leflunomide (ARAVA) 10 MG tablet Take by mouth. 10/13/17  Yes [provider]  metoprolol tartrate (LOPRESSOR) 25 MG tablet Take 1 tablet (25 mg total) by mouth 2 (two) times daily. 06/10/18  Yes Sowles, Drue Stager, MD  potassium chloride SA (K-DUR,KLOR-CON) 20 MEQ tablet Take 1 tablet (20 mEq total) by mouth daily. 02/05/18  Yes Demetrios Loll, MD  predniSONE (DELTASONE) 5 MG tablet Take 1 tablet by mouth daily. 01/22/17  Yes Emmaline Kluver., MD  spironolactone (ALDACTONE) 25 MG tablet Take 0.5 tablets (12.5 mg total) by mouth daily. 06/28/18 06/28/19 Yes Sowles, Drue Stager, MD  thiamine (VITAMIN B-1) 100 MG tablet Take 1 tablet (100 mg total) by mouth 3 (three) times a week. 06/10/18  Yes Sowles, Drue Stager, MD  valsartan (DIOVAN) 80 MG tablet Take 80 mg by mouth daily. 06/02/18  Yes [provider]    Allergies as of 08/03/2018  . (No Known Allergies)    Family History  Problem Relation Age of Onset  . Diabetes Mother   . Cancer Father   .  AAA (abdominal aortic aneurysm) Brother     Social History   Socioeconomic History  . Marital status: Divorced    Spouse name: Not on file  . Number of children: 4  . Years of education: Not on file  . Highest education level: Not on file  Occupational History  . Occupation: retired   Scientific laboratory technician  . Financial resource strain: Not hard at all  . Food insecurity    Worry: Never true    Inability: Never true  . Transportation needs    Medical: No    Non-medical: No  Tobacco Use  . Smoking status:  Never Smoker  . Smokeless tobacco: Never Used  Substance and Sexual Activity  . Alcohol use: Not Currently    Alcohol/week: 0.0 standard drinks    Comment: 2 quarts a week  . Drug use: Not Currently    Types: Cocaine    Comment: as an young adult   . Sexual activity: Not Currently  Lifestyle  . Physical activity    Days per week: 0 days    Minutes per session: 0 min  . Stress: Not at all  Relationships  . Social connections    Talks on phone: More than three times a week    Gets together: More than three times a week    Attends religious service: Never    Active member of club or organization: No    Attends meetings of clubs or organizations: Never    Relationship status: Divorced  . Intimate partner violence    Fear of current or ex partner: No    Emotionally abused: No    Physically abused: No    Forced sexual activity: No  Other Topics Concern  . Not on file  Social History Narrative   Retired Summer of 2019   Lives alone    Review of Systems: See HPI, otherwise negative ROS  Physical Exam: BP 110/69   Pulse 72   Temp 98.2 F (36.8 C) (Oral)   Resp 16   Ht 5\' 6"  (1.676 m)   Wt 87.1 kg   SpO2 98%   BMI 30.99 kg/m  General:   Alert,  pleasant and cooperative in NAD Head:  Normocephalic and atraumatic. Neck:  Supple; no masses or thyromegaly. Lungs:  Clear throughout to auscultation, normal respiratory effort.    Heart:  +S1, +S2, Regular rate and rhythm, No edema. Abdomen:  Soft, nontender and nondistended. Normal bowel sounds, without guarding, and without rebound.   Neurologic:  Alert and  oriented x4;  grossly normal neurologically.  Impression/Plan: Jason Fitzgerald is here for a colonoscopy to be performed for average risk screening.  Risks, benefits, limitations, and alternatives regarding  colonoscopy have been reviewed with the patient.  Questions have been answered.  All parties agreeable.   Virgel Manifold, MD  08/19/2018, 8:07 AM

## 2018-08-19 NOTE — Op Note (Signed)
[facility] Gastroenterology Patient Name: Jason Fitzgerald Procedure Date: 08/19/2018 6:53 AM MRN: [id number] Account #: [id number] Date of Birth: Sep 07, 1950 Admit Type: Outpatient Age: 68 Room: [facility] ENDO ROOM 4 Gender: Male Note Status: Finalized Procedure:            Colonoscopy Indications:          Screening for colorectal malignant neoplasm Providers:            Eulonda Andalon B. Bonna Gains MD, MD Medicines:            Monitored Anesthesia Care Complications:        No immediate complications. Procedure:            Pre-Anesthesia Assessment:                       - ASA Grade Assessment: II - A patient with mild                        systemic disease.                       - Prior to the procedure, a History and Physical was                        performed, and patient medications, allergies and                        sensitivities were reviewed. The patient's tolerance of                        previous anesthesia was reviewed.                       - The risks and benefits of the procedure and the                        sedation options and risks were discussed with the                        patient. All questions were answered and informed                        consent was obtained.                       - Patient identification and proposed procedure were                        verified prior to the procedure by the physician, the                        nurse, the anesthesiologist, the anesthetist and the                        technician. The procedure was verified in the procedure                        room.                       After obtaining informed consent, the colonoscope was  passed under direct vision. Throughout the procedure,                        the patient's blood pressure, pulse, and oxygen                        saturations were monitored continuously. The                        Colonoscope was introduced through the  anus and                        advanced to the the cecum, identified by appendiceal                        orifice and ileocecal valve. The colonoscopy was                        performed with ease. The patient tolerated the                        procedure well. The quality of the bowel preparation                        was good. Findings:      The perianal and digital rectal examinations were normal.      A 3 mm polyp was found in the ascending colon. The polyp was sessile.       The polyp was removed with a cold biopsy forceps. Resection and       retrieval were complete.      A 10 mm polyp was found in the transverse colon. The polyp was sessile.       Area was successfully injected with saline for lesion assessment, and       this injection appeared to lift the lesion adequately. The polyp was       removed with a hot snare. Resection and retrieval were complete.      Two sessile polyps were found in the transverse colon. The polyps were 6       to 7 mm in size. These polyps were removed with a cold snare. Resection       and retrieval were complete.      The exam was otherwise without abnormality.      The rectum, sigmoid colon, descending colon, transverse colon, ascending       colon and cecum appeared normal.      Non-bleeding internal hemorrhoids were found during retroflexion. The       hemorrhoids were small. Impression:           - One 3 mm polyp in the ascending colon, removed with a                        cold biopsy forceps. Resected and retrieved.                       - One 10 mm polyp in the transverse colon, removed with                        a hot snare. Resected and retrieved. Injected.                       -  Two 6 to 7 mm polyps in the transverse colon, removed                        with a cold snare. Resected and retrieved.                       - The examination was otherwise normal.                       - The rectum, sigmoid colon, descending colon,                         transverse colon, ascending colon and cecum are normal.                       - Non-bleeding internal hemorrhoids. Recommendation:       - Discharge patient to home (with escort).                       - Advance diet as tolerated.                       - Continue present medications.                       - Await pathology results.                       - Repeat colonoscopy in 3 years.                       - The findings and recommendations were discussed with                        the patient.                       - The findings and recommendations were discussed with                        the patient's family.                       - Return to primary care physician as previously                        scheduled.                       - High fiber diet. Procedure Code(s):    --- Professional ---                       [phone], Colonoscopy, flexible; with removal of tumor(s),                        polyp(s), or other lesion(s) by snare technique                       66599, 59, Colonoscopy, flexible; with biopsy, single                        or multiple  45381, Colonoscopy, flexible; with directed submucosal                        injection(s), any substance Diagnosis Code(s):    --- Professional ---                       K63.5, Polyp of colon                       Z12.11, Encounter for screening for malignant neoplasm                        of colon                       K64.8, Other hemorrhoids CPT copyright 2019 American Medical Association. All rights reserved. The codes documented in this report are preliminary and upon coder review may  be revised to meet current compliance requirements.  Vonda Antigua, MD Margretta Sidle B. Bonna Gains MD, MD 08/19/2018 8:57:54 AM This report has been signed electronically. Number of Addenda: 0 Note Initiated On: 08/19/2018 6:53 AM Scope Withdrawal Time: 0 hours 27 minutes 31 seconds  Total Procedure  Duration: 0 hours 30 minutes 58 seconds  Estimated Blood Loss: Estimated blood loss: none.      [facility]

## 2018-08-19 NOTE — Anesthesia Postprocedure Evaluation (Signed)
Anesthesia Post Note  Patient: MILON DETHLOFF  Procedure(s) Performed: COLONOSCOPY WITH PROPOFOL (N/A )  Patient location during evaluation: Endoscopy Anesthesia Type: General Level of consciousness: awake and alert Pain management: pain level controlled Vital Signs Assessment: post-procedure vital signs reviewed and stable Respiratory status: spontaneous breathing, nonlabored ventilation, respiratory function stable and patient connected to nasal cannula oxygen Cardiovascular status: blood pressure returned to baseline and stable Postop Assessment: no apparent nausea or vomiting Anesthetic complications: no     Last Vitals:  Vitals:   08/19/18 0724 08/19/18 0854  BP: 110/69   Pulse: 72   Resp: 16   Temp: 36.8 C (!) 36.2 C  SpO2: 98%     Last Pain:  Vitals:   08/19/18 0924  TempSrc:   PainSc: 0-No pain                 Precious Haws Piscitello

## 2018-08-19 NOTE — Anesthesia Procedure Notes (Signed)
Date/Time: 08/19/2018 8:16 AM Performed by: Nelda Marseille, CRNA Pre-anesthesia Checklist: Patient identified, Emergency Drugs available, Suction available, Patient being monitored and Timeout performed Oxygen Delivery Method: Nasal cannula

## 2018-08-19 NOTE — Anesthesia Preprocedure Evaluation (Addendum)
Anesthesia Evaluation  Patient identified by MRN, date of birth, ID band Patient awake    Reviewed: Allergy & Precautions, H&P , NPO status , Patient's Chart, lab work & pertinent test results  History of Anesthesia Complications Negative for: history of anesthetic complications  Airway Mallampati: III  TM Distance: >3 FB Neck ROM: limited    Dental  (+) Chipped   Pulmonary shortness of breath and with exertion, Continuous Positive Airway Pressure Ventilation , COPD,           Cardiovascular Exercise Tolerance: Good hypertension, (-) angina+ CAD, + Past MI and +CHF       Neuro/Psych negative neurological ROS  negative psych ROS   GI/Hepatic Neg liver ROS, GERD  Medicated and Controlled,  Endo/Other  negative endocrine ROS  Renal/GU negative Renal ROS  negative genitourinary   Musculoskeletal  (+) Arthritis ,   Abdominal   Peds  Hematology negative hematology ROS (+)   Anesthesia Other Findings Past Medical History: No date: CHF (congestive heart failure) (HCC) No date: Chronic cough No date: COPD (chronic obstructive pulmonary disease) (HCC) No date: Elevated liver function tests No date: Emphysema of lung (HCC) No date: Fibrosis, pulmonary, interstitial, diffuse (HCC) No date: GERD (gastroesophageal reflux disease) No date: History of cocaine abuse (HCC) No date: Mediastinal lymphadenopathy No date: Pulmonary fibrosis (HCC) No date: Right inguinal hernia  Past Surgical History: 1973: HERNIA REPAIR; Right     Comment:  open 11/08/2014: INGUINAL HERNIA REPAIR; Right     Comment:  Procedure: LAPAROSCOPIC RIGHT INGUINAL HERNIA REPAIR;                Surgeon: Hubbard Robinson, MD;  Location: [facility] ORS;                Service: General;  Laterality: Right; 1972: knee arthroscopy; Right 03/05/2017: RIGHT/LEFT HEART CATH AND CORONARY ANGIOGRAPHY; Right     Comment:  Procedure: RIGHT/LEFT HEART CATH AND CORONARY                ANGIOGRAPHY;  Surgeon: Dionisio David, MD;  Location:               [place] CV LAB;  Service: Cardiovascular;                Laterality: Right;  BMI    Body Mass Index: 30.99 kg/m      Reproductive/Obstetrics negative OB ROS                            Anesthesia Physical Anesthesia Plan  ASA: III  Anesthesia Plan: General   Post-op Pain Management:    Induction: Intravenous  PONV Risk Score and Plan: Propofol infusion and TIVA  Airway Management Planned: Natural Airway and Nasal Cannula  Additional Equipment:   Intra-op Plan:   Post-operative Plan:   Informed Consent: I have reviewed the patients History and Physical, chart, labs and discussed the procedure including the risks, benefits and alternatives for the proposed anesthesia with the patient or authorized representative who has indicated his/her understanding and acceptance.     Dental Advisory Given  Plan Discussed with: Anesthesiologist, CRNA and Surgeon  Anesthesia Plan Comments: (Patient consented for risks of anesthesia including but not limited to:  - adverse reactions to medications - risk of intubation if required - damage to teeth, lips or other oral mucosa - sore throat or hoarseness - Damage to heart, brain, lungs or loss of life  Patient voiced understanding.)        Anesthesia Quick Evaluation

## 2018-08-20 NOTE — Progress Notes (Signed)
Non-identifying voicemail left.

## 2018-08-22 ENCOUNTER — Encounter: Payer: Self-pay | Admitting: Gastroenterology

## 2018-08-22 LAB — SURGICAL PATHOLOGY

## 2018-08-23 ENCOUNTER — Encounter: Payer: Self-pay | Admitting: Gastroenterology

## 2018-08-24 DIAGNOSIS — I509 Heart failure, unspecified: Secondary | ICD-10-CM | POA: Diagnosis not present

## 2018-08-25 ENCOUNTER — Other Ambulatory Visit: Payer: Self-pay

## 2018-08-25 NOTE — Patient Outreach (Signed)
[facility]) Care Management  08/25/2018  Jason Fitzgerald 1950-09-23 [id number]   Medication Adherence call to Mr. Jason Fitzgerald Hippa Identifiers Verify spoke with patient he is past due on Atorvastin patient explain he is taking 1 tablet daily and has plenty at this time patient said he always takes his medication on time. Jason Fitzgerald is showing past due under [place].   [place] Management Direct Dial [phone]  Fax [phone] [email or web address]

## 2018-09-08 DIAGNOSIS — E876 Hypokalemia: Secondary | ICD-10-CM | POA: Diagnosis not present

## 2018-09-14 ENCOUNTER — Ambulatory Visit (INDEPENDENT_AMBULATORY_CARE_PROVIDER_SITE_OTHER): Payer: Medicare Other | Admitting: Family Medicine

## 2018-09-14 ENCOUNTER — Encounter: Payer: Self-pay | Admitting: Family Medicine

## 2018-09-14 ENCOUNTER — Other Ambulatory Visit: Payer: Self-pay

## 2018-09-14 DIAGNOSIS — M05742 Rheumatoid arthritis with rheumatoid factor of left hand without organ or systems involvement: Secondary | ICD-10-CM

## 2018-09-14 DIAGNOSIS — I4891 Unspecified atrial fibrillation: Secondary | ICD-10-CM | POA: Diagnosis not present

## 2018-09-14 DIAGNOSIS — F1021 Alcohol dependence, in remission: Secondary | ICD-10-CM | POA: Diagnosis not present

## 2018-09-14 DIAGNOSIS — I2510 Atherosclerotic heart disease of native coronary artery without angina pectoris: Secondary | ICD-10-CM

## 2018-09-14 DIAGNOSIS — M05741 Rheumatoid arthritis with rheumatoid factor of right hand without organ or systems involvement: Secondary | ICD-10-CM | POA: Diagnosis not present

## 2018-09-14 DIAGNOSIS — E519 Thiamine deficiency, unspecified: Secondary | ICD-10-CM

## 2018-09-14 DIAGNOSIS — J8410 Pulmonary fibrosis, unspecified: Secondary | ICD-10-CM

## 2018-09-14 DIAGNOSIS — G629 Polyneuropathy, unspecified: Secondary | ICD-10-CM

## 2018-09-14 DIAGNOSIS — I509 Heart failure, unspecified: Secondary | ICD-10-CM | POA: Diagnosis not present

## 2018-09-14 NOTE — Progress Notes (Signed)
Name: Jason Fitzgerald   MRN: [id number]    DOB: 18-Jun-1950   Date:09/14/2018       Progress Note  Subjective  Chief Complaint  Chief Complaint  Patient presents with  . Follow-up    3 Month Follow Up    I connected with  [name]  on 09/14/18 at 11:20 AM EDT by a telephone encounter   and verified that I am speaking with the correct person using two identifiers.  I discussed the limitations of evaluation and management by telemedicine and the availability of in person appointments. The patient expressed understanding and agreed to proceed. Staff also discussed with the patient that there may be a patient responsible charge related to this service. Patient Location: at home  Provider Location: [facility]   HPI  History of alcoholism: he states he quit drinking in 2020 but not sure of the month, taking Vitamin B1 and last level was high , currently down to three times weekly .   HTN/CHF and Afib: rate controlled with beta-blocker, amiodarone, also taking aldactone, off lasix and  potassium. Denies SOB, palpitation  or orthopnea, he states dizziness has resolved once he started getting up slowly  . He states he has been compliant with medication. Off Xarelto and Entresto because of cost of medication, he is on aspirin 325 mg, discussed again risk of bleeding since he takes prednisone daily for RA.   RA: under the care of Dr. Jefm Bryant. He has been taking Plaquenil 400 mg daily, prednisone and Arava , denies side effects of medication, he states pain is under control at this time he states  today is 6/10 and states for him it is under control  Toe paresthesia and sometimes pain: he was seen by Podiatrist and states they did not find the cause, discussed low B12 may be a cause, also discussed possible neuropathy and is already on Neurontin   Malnutrition: doing better, eating more now that he stopped drinking , gained almost 10 lbs prior to last visit but he was not  able to check his weight at home today.   CAD: he states he has been told he had a heart attack, not on statin, normal LDL,he is on statin therapy and will have labs done by Dr. Humphrey Rolls tomorrow for labs  Pulmonary fiboris : never smoked, states his mother did and also co-workers, under the care of Dr. Raul Del, has daily cough but no wheezing , he denies current SOB, CT chest done 2019 showed bilateral interstitial lung opacities with pulmonary fibrosis.  Unchanged   Neuropathy: on both feet, he states gabapentin has helped left leg more than right ,he has toe tingling  Gynecomastia: discussed imaging, going on for months, it may be from medications, history of alcoholism. He would like to hold off on further testing at this time. Unchanged    Patient Active Problem List   Diagnosis Date Noted  . Encounter for screening colonoscopy   . Polyp of transverse colon   . Internal hemorrhoids   . History of alcoholism ([place]) 12/08/2017  . Rheumatoid arthritis involving multiple sites with positive rheumatoid factor ([place]) 10/13/2017  . Atrial fibrillation ([place]) 03/12/2017  . Arthritis of left knee 08/01/2016  . 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 Chronic cough 05/29/2015  . CAFL (chronic airflow limitation) ([place]) 10/24/2014  . Arteriosclerosis of coronary artery 10/24/2014  . History of fundoplication 35/00/9381  . BP (high blood pressure) 10/24/2014  . LAD (lymphadenopathy), mediastinal  07/13/2013  . Interstitial lung disease ([place]) 06/19/2013  . 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 COLONOSCOPY WITH PROPOFOL N/A 08/19/2018   Procedure: COLONOSCOPY WITH PROPOFOL;  Surgeon: Virgel Manifold, MD;  Location: [facility] ENDOSCOPY;  Service: Endoscopy;  Laterality: N/A;  . HERNIA REPAIR Right 1973   open  . INGUINAL HERNIA REPAIR Right 11/08/2014   Procedure: LAPAROSCOPIC RIGHT INGUINAL HERNIA REPAIR;   Surgeon: Hubbard Robinson, MD;  Location: [facility] ORS;  Service: General;  Laterality: Right;  . knee arthroscopy Right 1972  . RIGHT/LEFT HEART CATH AND CORONARY ANGIOGRAPHY Right 03/05/2017   Procedure: RIGHT/LEFT HEART CATH AND CORONARY ANGIOGRAPHY;  Surgeon: Dionisio David, MD;  Location: [place] CV LAB;  Service: Cardiovascular;  Laterality: Right;    Family History  Problem Relation Age of Onset  . Diabetes Mother   . Cancer Father   . AAA (abdominal aortic aneurysm) Brother     Social History   Socioeconomic History  . Marital status: Divorced    Spouse name: Not on file  . Number of children: 4  . Years of education: Not on file  . Highest education level: Not on file  Occupational History  . Occupation: retired   Scientific laboratory technician  . Financial resource strain: Not hard at all  . Food insecurity    Worry: Never true    Inability: Never true  . Transportation needs    Medical: No    Non-medical: No  Tobacco Use  . Smoking status: Never Smoker  . Smokeless tobacco: Never Used  Substance and Sexual Activity  . Alcohol use: Not Currently    Alcohol/week: 0.0 standard drinks    Comment: 2 quarts a week  . Drug use: Not Currently    Types: Cocaine    Comment: as an young adult   . Sexual activity: Not Currently  Lifestyle  . Physical activity    Days per week: 0 days    Minutes per session: 0 min  . Stress: Not at all  Relationships  . Social connections    Talks on phone: More than three times a week    Gets together: More than three times a week    Attends religious service: Never    Active member of club or organization: No    Attends meetings of clubs or organizations: Never    Relationship status: Divorced  . Intimate partner violence    Fear of current or ex partner: No    Emotionally abused: No    Physically abused: No    Forced sexual activity: No  Other Topics Concern  . Not on file  Social History Narrative   Retired Summer of 2019   Lives  alone     Current Outpatient Medications:  .  albuterol (PROVENTIL HFA) 108 (90 Base) MCG/ACT inhaler, Inhale 2 puffs into the lungs as needed., Disp: 1 Inhaler, Rfl: 2 .  aspirin EC 325 MG EC tablet, Take 1 tablet (325 mg total) by mouth daily., Disp: 90 tablet, Rfl: 1 .  atorvastatin (LIPITOR) 10 MG tablet, Take 1 tablet (10 mg total) by mouth daily., Disp: 30 tablet, Rfl: 3 .  budesonide-formoterol (SYMBICORT) 160-4.5 MCG/ACT inhaler, Inhale 2 puffs into the lungs as needed., Disp: , Rfl:  .  digoxin (LANOXIN) 0.125 MG tablet, Take 1 tablet (0.125 mg total) by mouth daily., Disp: 30 tablet, Rfl: 0 .  gabapentin (NEURONTIN) 300 MG capsule, Take 1 capsule (  300 mg total) by mouth 2 (two) times daily., Disp: 60 capsule, Rfl: 3 .  hydroxychloroquine (PLAQUENIL) 200 MG tablet, Take 2 tablets by mouth daily., Disp: , Rfl: 1 .  leflunomide (ARAVA) 10 MG tablet, Take by mouth., Disp: , Rfl:  .  metoprolol tartrate (LOPRESSOR) 25 MG tablet, Take 1 tablet (25 mg total) by mouth 2 (two) times daily., Disp: 60 tablet, Rfl: 3 .  potassium chloride SA (K-DUR,KLOR-CON) 20 MEQ tablet, Take 1 tablet (20 mEq total) by mouth daily., Disp: 30 tablet, Rfl: 1 .  predniSONE (DELTASONE) 5 MG tablet, Take 1 tablet by mouth daily., Disp: , Rfl:  .  spironolactone (ALDACTONE) 25 MG tablet, Take 0.5 tablets (12.5 mg total) by mouth daily., Disp: 45 tablet, Rfl: 0 .  thiamine (VITAMIN B-1) 100 MG tablet, Take 1 tablet (100 mg total) by mouth 3 (three) times a week., Disp: 12 tablet, Rfl: 1 .  valsartan (DIOVAN) 80 MG tablet, Take 80 mg by mouth daily., Disp: , Rfl:  .  amiodarone (PACERONE) 200 MG tablet, Take 2 tablets by mouth daily., Disp: , Rfl:   No Known Allergies  I personally reviewed active problem list, medication list, allergies, family history, social history with the patient/caregiver today.   ROS  Ten systems reviewed and is negative except as mentioned in HPI   Objective  Virtual encounter,  vitals not obtained.  There is no height or weight on file to calculate BMI.  Physical Exam  Awake, alert and oriented  PHQ2/9: Depression screen [facility] 2/9 09/14/2018 06/10/2018 04/14/2018 03/14/2018 02/09/2018  Decreased Interest 0 0 0 0 0  Down, Depressed, Hopeless 0 0 0 0 0  PHQ - 2 Score 0 0 0 0 0  Altered sleeping 0 0 - - -  Tired, decreased energy 0 0 - - -  Change in appetite 0 0 - - -  Feeling bad or failure about yourself  0 0 - - -  Trouble concentrating 0 0 - - -  Moving slowly or fidgety/restless 0 1 - - -  Suicidal thoughts 0 0 - - -  PHQ-9 Score 0 1 - - -  Difficult doing work/chores - Not difficult at all - - -   PHQ-2/9 Result is negative.    Fall Risk: Fall Risk  09/14/2018 06/10/2018 04/14/2018 03/14/2018 02/09/2018  Falls in the past year? 0 0 1 1 0  Number falls in past yr: 0 0 1 0 0  Injury with Fall? 0 0 0 0 0  Risk for fall due to : - - History of fall(s) - -  Follow up - - Falls prevention discussed - -    Assessment & Plan  1. Atrial fibrillation, unspecified type [facility])  He will see Dr. Humphrey Rolls this week and having labs tomorrow  2. Chronic congestive heart failure, unspecified heart failure type ([place])  Denies orthopnea, states feeling good right now  3. History of alcoholism ([place])  He states still not drinking alcohol, taking B1 a few times a week now  4. Rheumatoid arthritis involving both hands with positive rheumatoid factor (HCC)  Seeing Dr, Jefm Bryant and pain seems to be controlled   5. Vitamin B1 deficiency   6. Coronary artery disease involving native coronary artery of native heart without angina pectoris  Under the care of Dr. Humphrey Rolls and denies chest pain at this time, on aspirin and statin therapy now  7. Pulmonary fibrosis (HCC)  Sees Dr. Raul Del states cough not as severe  8.  Neuropathy  On gabapentin   I discussed the assessment and treatment plan with the patient. The patient was provided an opportunity to ask questions and all were  answered. The patient agreed with the plan and demonstrated an understanding of the instructions.  The patient was advised to call back or seek an in-person evaluation if the symptoms worsen or if the condition fails to improve as anticipated.  I provided 25  minutes of non-face-to-face time during this encounter.

## 2018-09-15 DIAGNOSIS — I509 Heart failure, unspecified: Secondary | ICD-10-CM | POA: Diagnosis not present

## 2018-09-15 DIAGNOSIS — R0602 Shortness of breath: Secondary | ICD-10-CM | POA: Diagnosis not present

## 2018-09-15 DIAGNOSIS — I420 Dilated cardiomyopathy: Secondary | ICD-10-CM | POA: Diagnosis not present

## 2018-09-15 DIAGNOSIS — I10 Essential (primary) hypertension: Secondary | ICD-10-CM | POA: Diagnosis not present

## 2018-09-18 ENCOUNTER — Other Ambulatory Visit: Payer: Self-pay | Admitting: Family Medicine

## 2018-09-18 DIAGNOSIS — I509 Heart failure, unspecified: Secondary | ICD-10-CM

## 2018-09-18 DIAGNOSIS — I4891 Unspecified atrial fibrillation: Secondary | ICD-10-CM

## 2018-09-21 DIAGNOSIS — M0579 Rheumatoid arthritis with rheumatoid factor of multiple sites without organ or systems involvement: Secondary | ICD-10-CM | POA: Diagnosis not present

## 2018-09-21 DIAGNOSIS — Z79899 Other long term (current) drug therapy: Secondary | ICD-10-CM | POA: Diagnosis not present

## 2018-10-09 ENCOUNTER — Other Ambulatory Visit: Payer: Self-pay | Admitting: Family Medicine

## 2018-10-09 DIAGNOSIS — E519 Thiamine deficiency, unspecified: Secondary | ICD-10-CM

## 2018-10-14 DIAGNOSIS — I420 Dilated cardiomyopathy: Secondary | ICD-10-CM | POA: Diagnosis not present

## 2018-10-14 DIAGNOSIS — I480 Paroxysmal atrial fibrillation: Secondary | ICD-10-CM | POA: Diagnosis not present

## 2018-10-14 DIAGNOSIS — I5022 Chronic systolic (congestive) heart failure: Secondary | ICD-10-CM | POA: Diagnosis not present

## 2018-10-14 DIAGNOSIS — I509 Heart failure, unspecified: Secondary | ICD-10-CM | POA: Diagnosis not present

## 2018-10-14 DIAGNOSIS — I2510 Atherosclerotic heart disease of native coronary artery without angina pectoris: Secondary | ICD-10-CM | POA: Diagnosis not present

## 2018-10-18 ENCOUNTER — Other Ambulatory Visit: Payer: Self-pay | Admitting: Family Medicine

## 2018-10-18 DIAGNOSIS — I2510 Atherosclerotic heart disease of native coronary artery without angina pectoris: Secondary | ICD-10-CM

## 2018-10-21 DIAGNOSIS — I420 Dilated cardiomyopathy: Secondary | ICD-10-CM | POA: Diagnosis not present

## 2018-10-21 DIAGNOSIS — I10 Essential (primary) hypertension: Secondary | ICD-10-CM | POA: Diagnosis not present

## 2018-10-21 DIAGNOSIS — I509 Heart failure, unspecified: Secondary | ICD-10-CM | POA: Diagnosis not present

## 2018-11-16 DIAGNOSIS — J849 Interstitial pulmonary disease, unspecified: Secondary | ICD-10-CM | POA: Diagnosis not present

## 2018-11-16 DIAGNOSIS — R0600 Dyspnea, unspecified: Secondary | ICD-10-CM | POA: Diagnosis not present

## 2018-11-16 DIAGNOSIS — J439 Emphysema, unspecified: Secondary | ICD-10-CM | POA: Diagnosis not present

## 2018-11-17 ENCOUNTER — Other Ambulatory Visit: Payer: Self-pay | Admitting: Specialist

## 2018-11-17 DIAGNOSIS — J849 Interstitial pulmonary disease, unspecified: Secondary | ICD-10-CM

## 2018-11-24 ENCOUNTER — Ambulatory Visit
Admission: RE | Admit: 2018-11-24 | Discharge: 2018-11-24 | Disposition: A | Discharge status: Home / Self Care | Payer: Medicare Other | Source: Ambulatory Visit | Attending: Specialist | Admitting: Specialist

## 2018-11-24 ENCOUNTER — Other Ambulatory Visit: Payer: Self-pay

## 2018-11-24 DIAGNOSIS — E041 Nontoxic single thyroid nodule: Secondary | ICD-10-CM | POA: Diagnosis not present

## 2018-11-24 DIAGNOSIS — J849 Interstitial pulmonary disease, unspecified: Secondary | ICD-10-CM | POA: Insufficient documentation

## 2018-12-14 ENCOUNTER — Ambulatory Visit: Payer: Self-pay | Admitting: Pharmacist

## 2018-12-15 DIAGNOSIS — I4891 Unspecified atrial fibrillation: Secondary | ICD-10-CM | POA: Diagnosis not present

## 2018-12-15 DIAGNOSIS — R0602 Shortness of breath: Secondary | ICD-10-CM | POA: Diagnosis not present

## 2018-12-15 DIAGNOSIS — I2510 Atherosclerotic heart disease of native coronary artery without angina pectoris: Secondary | ICD-10-CM | POA: Diagnosis not present

## 2018-12-15 DIAGNOSIS — I509 Heart failure, unspecified: Secondary | ICD-10-CM | POA: Diagnosis not present

## 2018-12-16 ENCOUNTER — Other Ambulatory Visit: Payer: Self-pay

## 2018-12-16 ENCOUNTER — Encounter: Payer: Self-pay | Admitting: Family Medicine

## 2018-12-16 ENCOUNTER — Ambulatory Visit (INDEPENDENT_AMBULATORY_CARE_PROVIDER_SITE_OTHER): Payer: Medicare Other | Admitting: Family Medicine

## 2018-12-16 VITALS — BP 82/50 | HR 79 | Temp 97.7°F | Resp 16 | Ht 66.0 in | Wt 190.8 lb

## 2018-12-16 DIAGNOSIS — D649 Anemia, unspecified: Secondary | ICD-10-CM

## 2018-12-16 DIAGNOSIS — I2510 Atherosclerotic heart disease of native coronary artery without angina pectoris: Secondary | ICD-10-CM

## 2018-12-16 DIAGNOSIS — F1021 Alcohol dependence, in remission: Secondary | ICD-10-CM

## 2018-12-16 DIAGNOSIS — J8410 Pulmonary fibrosis, unspecified: Secondary | ICD-10-CM

## 2018-12-16 DIAGNOSIS — M05741 Rheumatoid arthritis with rheumatoid factor of right hand without organ or systems involvement: Secondary | ICD-10-CM

## 2018-12-16 DIAGNOSIS — I4891 Unspecified atrial fibrillation: Secondary | ICD-10-CM | POA: Diagnosis not present

## 2018-12-16 DIAGNOSIS — Z23 Encounter for immunization: Secondary | ICD-10-CM

## 2018-12-16 DIAGNOSIS — M05742 Rheumatoid arthritis with rheumatoid factor of left hand without organ or systems involvement: Secondary | ICD-10-CM | POA: Diagnosis not present

## 2018-12-16 DIAGNOSIS — E519 Thiamine deficiency, unspecified: Secondary | ICD-10-CM | POA: Diagnosis not present

## 2018-12-16 DIAGNOSIS — I509 Heart failure, unspecified: Secondary | ICD-10-CM

## 2018-12-16 NOTE — Progress Notes (Signed)
Name: Jason Fitzgerald   MRN: [id number]    DOB: 1950-06-02   Date:12/16/2018       Progress Note  Subjective  Chief Complaint  Chief Complaint  Patient presents with  . Medication Refill    3 month F/U  . Atrial Fibrillation  . Hypertension  . RA  . Gynecomastia  . Coronary Artery Disease    HPI  History of alcoholism: he states he quit drinking in 2020 but not sure of the month, taking Vitamin B1and last level was high , currently down to three times weekly , he has been off for a few days , we will recheck labs today .   HTN/CHF and Afib: rate controlled with beta-blocker, amiodarone, also taking aldactone,off lasix andpotassium. Denies SOB, palpitationor orthopnea, he states dizziness has resolved once he started getting up slowly . He states he has been compliant with medication. Off Xarelto and Entresto because of cost of medication,he is on aspirin 325 mg, discussed again risk of bleeding since he takes prednisone daily for RA.  RA: under the care of Dr. Jefm Bryant. He is off plaquenil ( he states stopped by cardiologist) still taking  prednisone and Arava. He states pain is under control at this time he states  today is 7/10 and states for him it is under control  Malnutrition: doing better, eating more now that he stopped drinking, he gained up to 193 lbs and is down again a little, discussed importance of eating high protein diet   CAD: he states he has been told he had a heart attack, not on statin, normal LDL,he is on statin therapy and will have labs done today, he states he has not had any recent labs.   Pulmonary fiboris: never smoked, states his mother did and also co-workers, under the care of Dr. Raul Del, has daily cough but nowheezing , he denies current SOB, CT chest done 2019 showed bilateral interstitial lung opacities with pulmonary fibrosis.  He has to continuously has to spit up water and mucus - discussed referral GI but he refused     IMPRESSION:  1. 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 [place], Iss 5,  [phone], Oct 03 2016.  2. One vessel coronary atherosclerosis.  3. Prominent symmetric bilateral gynecomastia, worsened.   Neuropathy: on both feet, he states gabapentin has helped left leg more than right ,he has toe tingling  Gynecomastia: discussed imaging, going on for months, it may be from medications, history of alcoholism. He does not want further testing  He brought a list of his medication that does not match all of our medications, he refused chronic care management, explained importance of taking all medications to all medication visits   Patient Active Problem List   Diagnosis Date Noted  . Encounter for screening colonoscopy   . Polyp of transverse colon   . Internal hemorrhoids   . History of alcoholism ([place]) 12/08/2017  . Rheumatoid arthritis involving multiple sites with positive rheumatoid factor ([place]) 10/13/2017  . Atrial fibrillation ([place]) 03/12/2017  . Arthritis of left knee 08/01/2016  . 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 Chronic cough 05/29/2015  . CAFL (chronic airflow limitation) ([place]) 10/24/2014  . Arteriosclerosis of coronary artery 10/24/2014  . History  of fundoplication 47/42/5956  . BP (high blood pressure) 10/24/2014  . LAD (lymphadenopathy), mediastinal 07/13/2013  . Interstitial lung disease ([place]) 06/19/2013  . 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 COLONOSCOPY WITH PROPOFOL N/A 08/19/2018   Procedure: COLONOSCOPY WITH PROPOFOL;  Surgeon: Virgel Manifold, MD;  Location: [facility] ENDOSCOPY;  Service: Endoscopy;  Laterality: N/A;  . HERNIA REPAIR Right 1973   open  . INGUINAL HERNIA REPAIR Right 11/08/2014   Procedure: LAPAROSCOPIC RIGHT INGUINAL HERNIA REPAIR;  Surgeon: Hubbard Robinson, MD;  Location: [facility] ORS;  Service: General;  Laterality: Right;  . knee arthroscopy Right 1972  . RIGHT/LEFT HEART CATH AND CORONARY ANGIOGRAPHY Right 03/05/2017   Procedure: RIGHT/LEFT HEART CATH AND CORONARY ANGIOGRAPHY;  Surgeon: Dionisio David, MD;  Location: [facility];  Service: Cardiovascular;  Laterality: Right;    Family History  Problem Relation Age of Onset  . Diabetes Mother   . Cancer Father   . AAA (abdominal aortic aneurysm) Brother     Social History   Socioeconomic History  . Marital status: Divorced    Spouse name: Not on file  . Number of children: 4  . Years of education: Not on file  . Highest education level: Not on file  Occupational History  . Occupation: retired   Scientific laboratory technician  . Financial resource strain: Not hard at all  . Food insecurity    Worry: Never true    Inability: Never true  . Transportation needs    Medical: No    Non-medical: No  Tobacco Use  . Smoking status: Never Smoker  . Smokeless tobacco: Never Used  Substance and Sexual Activity  . Alcohol use: Not Currently    Alcohol/week: 0.0 standard drinks    Comment: 2 quarts a week  . Drug use: Not Currently    Types: Cocaine    Comment: as an young adult   . Sexual activity: Not Currently  Lifestyle  . Physical activity    Days per week: 0 days    Minutes per session: 0 min  . Stress: Not at all  Relationships  . Social connections    Talks on phone: More than three times a week    Gets together: More than three times a week    Attends religious service: Never    Active member of club or organization: No    Attends meetings of clubs or organizations: Never    Relationship status: Divorced  . Intimate partner violence     Fear of current or ex partner: No    Emotionally abused: No    Physically abused: No    Forced sexual activity: No  Other Topics Concern  . Not on file  Social History Narrative   Retired Summer of 2019   Lives alone     Current Outpatient Medications:  .  albuterol (PROVENTIL HFA) 108 (90 Base) MCG/ACT inhaler, Inhale 2 puffs into the lungs as needed., Disp: 1 Inhaler, Rfl: 2 .  amiodarone (PACERONE) 200 MG tablet, Take 2 tablets by mouth daily., Disp: , Rfl:  .  aspirin 325 MG EC tablet, TAKE 1 TABLET BY MOUTH EVERY DAY, Disp: 90 tablet, Rfl: 1 .  atorvastatin (LIPITOR) 10 MG tablet, TAKE 1 TABLET BY MOUTH EVERY DAY, Disp: 30 tablet, Rfl: 3 .  budesonide-formoterol (SYMBICORT) 160-4.5 MCG/ACT inhaler, Inhale 2 puffs into the lungs as needed., Disp: , Rfl:  .  gabapentin (NEURONTIN)  300 MG capsule, Take 1 capsule (300 mg total) by mouth 2 (two) times daily., Disp: 60 capsule, Rfl: 3 .  leflunomide (ARAVA) 10 MG tablet, Take by mouth., Disp: , Rfl:  .  metoprolol succinate (TOPROL-XL) 100 MG 24 hr tablet, Take 100 mg by mouth daily., Disp: , Rfl:  .  metoprolol tartrate (LOPRESSOR) 25 MG tablet, Take 1 tablet (25 mg total) by mouth 2 (two) times daily., Disp: 60 tablet, Rfl: 3 .  potassium chloride SA (K-DUR,KLOR-CON) 20 MEQ tablet, Take 1 tablet (20 mEq total) by mouth daily., Disp: 30 tablet, Rfl: 1 .  predniSONE (DELTASONE) 5 MG tablet, Take 1 tablet by mouth daily., Disp: , Rfl:  .  spironolactone (ALDACTONE) 25 MG tablet, Take 0.5 tablets (12.5 mg total) by mouth daily., Disp: 45 tablet, Rfl: 0 .  thiamine (VITAMIN B-1) 100 MG tablet, TAKE 1 TABLET BY MOUTH EVERY DAY, Disp: 30 tablet, Rfl: 1 .  valsartan (DIOVAN) 80 MG tablet, Take 80 mg by mouth daily., Disp: , Rfl:   No Known Allergies  I personally reviewed active problem list, medication list, allergies, family history, social history with the patient/caregiver today.   ROS  Ten systems reviewed and is negative except as  mentioned in HPI   Objective  Vitals:   12/16/18 1100 12/16/18 1144  BP: (!) 80/50 (!) 82/50  Pulse: 79   Resp: 16   Temp: 97.7 F (36.5 C)   TempSrc: Temporal   SpO2: 94%   Weight: 190 lb 12.8 oz (86.5 kg)   Height: 5\' 6"  (1.676 m)     Body mass index is 30.8 kg/m.  Physical Exam  Constitutional: Patient appears well-developed and well-nourished. Obese  No distress.  HEENT: head atraumatic, normocephalic, pupils equal and reactive to light, Cardiovascular: Normal rate, regular rhythm and normal heart sounds.  No murmur heard. No BLE edema. Skin: hyperpigmentation of nose, discussed referral to dermatologist but he refused Pulmonary/Chest: Effort normal , upper airway noise, no crackles or wheezing. No respiratory distress. Abdominal: Soft.  There is no tenderness. Psychiatric: Patient has a normal mood and affect. behavior is normal. Judgment and thought content normal.  PHQ2/9: Depression screen [facility] 2/9 12/16/2018 09/14/2018 06/10/2018 04/14/2018 03/14/2018  Decreased Interest 0 0 0 0 0  Down, Depressed, Hopeless 0 0 0 0 0  PHQ - 2 Score 0 0 0 0 0  Altered sleeping 1 0 0 - -  Tired, decreased energy 1 0 0 - -  Change in appetite 2 0 0 - -  Feeling bad or failure about yourself  0 0 0 - -  Trouble concentrating 0 0 0 - -  Moving slowly or fidgety/restless - 0 1 - -  Suicidal thoughts 0 0 0 - -  PHQ-9 Score 4 0 1 - -  Difficult doing work/chores Somewhat difficult - Not difficult at all - -    phq 9 is negative  Fall Risk: Fall Risk  09/14/2018 06/10/2018 04/14/2018 03/14/2018 02/09/2018  Falls in the past year? 0 0 1 1 0  Number falls in past yr: 0 0 1 0 0  Injury with Fall? 0 0 0 0 0  Risk for fall due to : - - History of fall(s) - -  Follow up - - Falls prevention discussed - -     Assessment & Plan  1. Need for immunization against influenza  - Flu Vaccine QUAD High Dose(Fluad)  2. History of alcoholism ([place])  - COMPLETE METABOLIC PANEL WITH GFR  3. Atrial  fibrillation, unspecified type ([place])  Rate controlled  4. Pulmonary fibrosis ([place])  Keep follow up with Dr. Raul Del   5. Vitamin B1 deficiency  - Vitamin B1  6. Coronary artery disease involving native coronary artery of native heart without angina pectoris  - Lipid panel   7. Rheumatoid arthritis involving both hands with positive rheumatoid factor ([facility]) - COMPLETE METABOLIC PANEL WITH GFR  8. Chronic congestive heart failure, unspecified heart failure type ([place])  stable  9. Anemia, unspecified type  - CBC with Differential/Platelet

## 2018-12-17 ENCOUNTER — Other Ambulatory Visit: Payer: Self-pay

## 2018-12-17 ENCOUNTER — Encounter: Payer: Self-pay | Admitting: Family Medicine

## 2018-12-17 ENCOUNTER — Observation Stay: Payer: Medicare Other

## 2018-12-17 ENCOUNTER — Inpatient Hospital Stay
Admission: EM | Admit: 2018-12-17 | Discharge: 2018-12-20 | DRG: 641 | Disposition: A | Discharge status: Home / Self Care | Payer: Medicare Other | Source: Ambulatory Visit | Attending: Internal Medicine | Admitting: Internal Medicine

## 2018-12-17 ENCOUNTER — Encounter: Payer: Self-pay | Admitting: Emergency Medicine

## 2018-12-17 DIAGNOSIS — J449 Chronic obstructive pulmonary disease, unspecified: Secondary | ICD-10-CM | POA: Diagnosis present

## 2018-12-17 DIAGNOSIS — I110 Hypertensive heart disease with heart failure: Secondary | ICD-10-CM | POA: Diagnosis present

## 2018-12-17 DIAGNOSIS — M069 Rheumatoid arthritis, unspecified: Secondary | ICD-10-CM | POA: Diagnosis not present

## 2018-12-17 DIAGNOSIS — Z79899 Other long term (current) drug therapy: Secondary | ICD-10-CM | POA: Diagnosis not present

## 2018-12-17 DIAGNOSIS — J439 Emphysema, unspecified: Secondary | ICD-10-CM | POA: Diagnosis present

## 2018-12-17 DIAGNOSIS — Z7952 Long term (current) use of systemic steroids: Secondary | ICD-10-CM | POA: Diagnosis not present

## 2018-12-17 DIAGNOSIS — Z833 Family history of diabetes mellitus: Secondary | ICD-10-CM | POA: Diagnosis not present

## 2018-12-17 DIAGNOSIS — R509 Fever, unspecified: Secondary | ICD-10-CM | POA: Diagnosis not present

## 2018-12-17 DIAGNOSIS — I10 Essential (primary) hypertension: Secondary | ICD-10-CM | POA: Diagnosis not present

## 2018-12-17 DIAGNOSIS — R7989 Other specified abnormal findings of blood chemistry: Secondary | ICD-10-CM | POA: Diagnosis present

## 2018-12-17 DIAGNOSIS — J84112 Idiopathic pulmonary fibrosis: Secondary | ICD-10-CM | POA: Diagnosis not present

## 2018-12-17 DIAGNOSIS — Z20828 Contact with and (suspected) exposure to other viral communicable diseases: Secondary | ICD-10-CM | POA: Diagnosis present

## 2018-12-17 DIAGNOSIS — I959 Hypotension, unspecified: Secondary | ICD-10-CM | POA: Diagnosis not present

## 2018-12-17 DIAGNOSIS — D61818 Other pancytopenia: Secondary | ICD-10-CM | POA: Diagnosis present

## 2018-12-17 DIAGNOSIS — F1010 Alcohol abuse, uncomplicated: Secondary | ICD-10-CM | POA: Diagnosis present

## 2018-12-17 DIAGNOSIS — R197 Diarrhea, unspecified: Secondary | ICD-10-CM | POA: Diagnosis not present

## 2018-12-17 DIAGNOSIS — M0579 Rheumatoid arthritis with rheumatoid factor of multiple sites without organ or systems involvement: Secondary | ICD-10-CM | POA: Diagnosis present

## 2018-12-17 DIAGNOSIS — J8410 Pulmonary fibrosis, unspecified: Secondary | ICD-10-CM | POA: Diagnosis present

## 2018-12-17 DIAGNOSIS — I480 Paroxysmal atrial fibrillation: Secondary | ICD-10-CM

## 2018-12-17 DIAGNOSIS — K219 Gastro-esophageal reflux disease without esophagitis: Secondary | ICD-10-CM | POA: Diagnosis not present

## 2018-12-17 DIAGNOSIS — D649 Anemia, unspecified: Secondary | ICD-10-CM

## 2018-12-17 DIAGNOSIS — I4821 Permanent atrial fibrillation: Secondary | ICD-10-CM | POA: Diagnosis not present

## 2018-12-17 DIAGNOSIS — R531 Weakness: Secondary | ICD-10-CM | POA: Diagnosis not present

## 2018-12-17 DIAGNOSIS — R945 Abnormal results of liver function studies: Secondary | ICD-10-CM | POA: Diagnosis not present

## 2018-12-17 DIAGNOSIS — Z7982 Long term (current) use of aspirin: Secondary | ICD-10-CM | POA: Diagnosis not present

## 2018-12-17 DIAGNOSIS — E876 Hypokalemia: Secondary | ICD-10-CM | POA: Diagnosis not present

## 2018-12-17 DIAGNOSIS — R42 Dizziness and giddiness: Secondary | ICD-10-CM | POA: Diagnosis not present

## 2018-12-17 DIAGNOSIS — I5022 Chronic systolic (congestive) heart failure: Secondary | ICD-10-CM | POA: Diagnosis not present

## 2018-12-17 DIAGNOSIS — R0602 Shortness of breath: Secondary | ICD-10-CM | POA: Diagnosis not present

## 2018-12-17 DIAGNOSIS — I509 Heart failure, unspecified: Secondary | ICD-10-CM

## 2018-12-17 DIAGNOSIS — J849 Interstitial pulmonary disease, unspecified: Secondary | ICD-10-CM | POA: Diagnosis present

## 2018-12-17 LAB — URINALYSIS, COMPLETE (UACMP) WITH MICROSCOPIC
Bilirubin Urine: NEGATIVE
Glucose, UA: NEGATIVE mg/dL
Hgb urine dipstick: NEGATIVE
Ketones, ur: 20 mg/dL — AB
Leukocytes,Ua: NEGATIVE
Nitrite: NEGATIVE
Protein, ur: 30 mg/dL — AB
Specific Gravity, Urine: 1.014 (ref 1.005–1.030)
pH: 6 (ref 5.0–8.0)

## 2018-12-17 LAB — LIPID PANEL
Cholesterol: 176 (ref 0–200)
HDL: 113 — AB (ref 35–70)
LDL Cholesterol: 46
Triglycerides: 85 (ref 40–160)

## 2018-12-17 LAB — COMPREHENSIVE METABOLIC PANEL
ALT: 31 U/L (ref 0–44)
AST: 109 U/L — ABNORMAL HIGH (ref 15–41)
Albumin: 2.7 g/dL — ABNORMAL LOW (ref 3.5–5.0)
Alkaline Phosphatase: 254 U/L — ABNORMAL HIGH (ref 38–126)
Anion gap: 14 (ref 5–15)
BUN: 8 mg/dL (ref 8–23)
CO2: 30 mmol/L (ref 22–32)
Calcium: 7.5 mg/dL — ABNORMAL LOW (ref 8.9–10.3)
Chloride: 95 mmol/L — ABNORMAL LOW (ref 98–111)
Creatinine, Ser: 0.69 mg/dL (ref 0.61–1.24)
GFR calc Af Amer: >60 mL/min (ref >60)
GFR calc non Af Amer: >60 mL/min (ref >60)
Glucose, Bld: 104 mg/dL — ABNORMAL HIGH (ref 70–99)
Potassium: 2.1 mmol/L — CL (ref 3.5–5.1)
Sodium: 139 mmol/L (ref 135–145)
Total Bilirubin: 2 mg/dL — ABNORMAL HIGH (ref 0.3–1.2)
Total Protein: 6.5 g/dL (ref 6.5–8.1)

## 2018-12-17 LAB — IRON AND TIBC
Iron: 38 ug/dL — ABNORMAL LOW (ref 45–182)
Saturation Ratios: 20 % (ref 17.9–39.5)
TIBC: 191 ug/dL — ABNORMAL LOW (ref 250–450)
UIBC: 153 ug/dL

## 2018-12-17 LAB — RETICULOCYTES
Immature Retic Fract: 24.2 % — ABNORMAL HIGH (ref 2.3–15.9)
RBC.: 3.5 MIL/uL — ABNORMAL LOW (ref 4.22–5.81)
Retic Count, Absolute: 71.1 10*3/uL (ref 19.0–186.0)
Retic Ct Pct: 2 % (ref 0.4–3.1)

## 2018-12-17 LAB — CBC
HCT: 32.6 % — ABNORMAL LOW (ref 39.0–52.0)
HCT: 28.8 % — ABNORMAL LOW (ref 39.0–52.0)
Hemoglobin: 11.7 g/dL — ABNORMAL LOW (ref 13.0–17.0)
Hemoglobin: 10.6 g/dL — ABNORMAL LOW (ref 13.0–17.0)
MCH: 31.1 pg (ref 26.0–34.0)
MCH: 32.2 pg (ref 26.0–34.0)
MCHC: 35.9 g/dL (ref 30.0–36.0)
MCHC: 36.8 g/dL — ABNORMAL HIGH (ref 30.0–36.0)
MCV: 86.7 fL (ref 80.0–100.0)
MCV: 87.5 fL (ref 80.0–100.0)
Platelets: 126 10*3/uL — ABNORMAL LOW (ref 150–400)
Platelets: 116 10*3/uL — ABNORMAL LOW (ref 150–400)
RBC: 3.76 MIL/uL — ABNORMAL LOW (ref 4.22–5.81)
RBC: 3.29 MIL/uL — ABNORMAL LOW (ref 4.22–5.81)
RDW: 15.5 % (ref 11.5–15.5)
RDW: 15.3 % (ref 11.5–15.5)
WBC: 3.7 10*3/uL — ABNORMAL LOW (ref 4.0–10.5)
WBC: 3.4 10*3/uL — ABNORMAL LOW (ref 4.0–10.5)
nRBC: 0 % (ref 0.0–0.2)
nRBC: 0 % (ref 0.0–0.2)

## 2018-12-17 LAB — BASIC METABOLIC PANEL
BUN: 7 (ref 4–21)
Chloride: 96 — AB (ref 99–108)
Glucose: 117
Potassium: 2.3 — AB (ref 3.4–5.3)
Sodium: 141 (ref 137–147)

## 2018-12-17 LAB — FIBRIN DERIVATIVES D-DIMER (ARMC ONLY): Fibrin derivatives D-dimer (ARMC): 802.18 ng/mL (FEU) — ABNORMAL HIGH (ref 0.00–499.00)

## 2018-12-17 LAB — MAGNESIUM
Magnesium: 1.7 mg/dL (ref 1.7–2.4)
Magnesium: 1.7 mg/dL (ref 1.7–2.4)

## 2018-12-17 LAB — ETHANOL: Alcohol, Ethyl (B): <10 mg/dL (ref <10)

## 2018-12-17 LAB — TSH: TSH: 1.122 u[IU]/mL (ref 0.350–4.500)

## 2018-12-17 LAB — PHOSPHORUS: Phosphorus: 2.3 mg/dL — ABNORMAL LOW (ref 2.5–4.6)

## 2018-12-17 LAB — T4, FREE: Free T4: 1.06 ng/dL (ref 0.61–1.12)

## 2018-12-17 LAB — TROPONIN I (HIGH SENSITIVITY): Troponin I (High Sensitivity): 26 ng/L — ABNORMAL HIGH (ref <18)

## 2018-12-17 LAB — SARS CORONAVIRUS 2 (TAT 6-24 HRS): SARS Coronavirus 2: NEGATIVE

## 2018-12-17 LAB — HIV ANTIBODY (ROUTINE TESTING W REFLEX): HIV Screen 4th Generation wRfx: NONREACTIVE

## 2018-12-17 MED ORDER — ALBUTEROL SULFATE (2.5 MG/3ML) 0.083% IN NEBU
3.0000 mL | INHALATION_SOLUTION | Freq: Four times a day (QID) | RESPIRATORY_TRACT | Status: DC
  Administered 2018-12-17 – 2018-12-18 (×3): 3 mL via RESPIRATORY_TRACT
  Filled 2018-12-17 (×3): qty 3

## 2018-12-17 MED ORDER — HEPARIN SODIUM (PORCINE) 5000 UNIT/ML IJ SOLN
5000.0000 [IU] | Freq: Three times a day (TID) | INTRAMUSCULAR | Status: DC
  Administered 2018-12-17 – 2018-12-18 (×2): 5000 [IU] via SUBCUTANEOUS
  Filled 2018-12-17 (×5): qty 1

## 2018-12-17 MED ORDER — POTASSIUM PHOSPHATES 15 MMOLE/5ML IV SOLN
30.0000 mmol | Freq: Once | INTRAVENOUS | Status: DC
  Filled 2018-12-17: qty 10

## 2018-12-17 MED ORDER — AMIODARONE HCL 200 MG PO TABS
400.0000 mg | ORAL_TABLET | Freq: Every day | ORAL | Status: DC
  Filled 2018-12-17: qty 2

## 2018-12-17 MED ORDER — PANTOPRAZOLE SODIUM 40 MG IV SOLR
40.0000 mg | INTRAVENOUS | Status: DC
  Administered 2018-12-17 – 2018-12-18 (×2): 40 mg via INTRAVENOUS
  Filled 2018-12-17 (×2): qty 40

## 2018-12-17 MED ORDER — POTASSIUM CHLORIDE 10 MEQ/100ML IV SOLN
10.0000 meq | Freq: Once | INTRAVENOUS | Status: AC
  Administered 2018-12-17: 10 meq via INTRAVENOUS
  Filled 2018-12-17: qty 100

## 2018-12-17 MED ORDER — SODIUM CHLORIDE 0.9 % IV BOLUS: 500.0000 mL | Freq: Once | INTRAVENOUS | Status: DC

## 2018-12-17 MED ORDER — POTASSIUM CHLORIDE CRYS ER 20 MEQ PO TBCR
40.0000 meq | EXTENDED_RELEASE_TABLET | Freq: Once | ORAL | Status: AC
  Administered 2018-12-17: 12:00:00 40 meq via ORAL
  Filled 2018-12-17: qty 2

## 2018-12-17 MED ORDER — GABAPENTIN 300 MG PO CAPS
300.0000 mg | ORAL_CAPSULE | Freq: Two times a day (BID) | ORAL | Status: DC
  Administered 2018-12-17 – 2018-12-20 (×7): 300 mg via ORAL
  Filled 2018-12-17 (×8): qty 1

## 2018-12-17 MED ORDER — SODIUM CHLORIDE 0.9 % IV BOLUS
1000.0000 mL | Freq: Once | INTRAVENOUS | Status: AC
  Administered 2018-12-17: 11:00:00 1000 mL via INTRAVENOUS

## 2018-12-17 MED ORDER — IOHEXOL 350 MG/ML SOLN
75.0000 mL | Freq: Once | INTRAVENOUS | Status: AC | PRN
  Administered 2018-12-17: 20:00:00 75 mL via INTRAVENOUS

## 2018-12-17 MED ORDER — METOPROLOL TARTRATE 50 MG PO TABS
25.0000 mg | ORAL_TABLET | Freq: Two times a day (BID) | ORAL | Status: DC
  Administered 2018-12-17: 16:00:00 25 mg via ORAL
  Filled 2018-12-17: qty 0.5
  Filled 2018-12-17: qty 1

## 2018-12-17 MED ORDER — MOMETASONE FURO-FORMOTEROL FUM 200-5 MCG/ACT IN AERO
2.0000 | INHALATION_SPRAY | Freq: Two times a day (BID) | RESPIRATORY_TRACT | Status: DC
  Administered 2018-12-17 – 2018-12-20 (×6): 2 via RESPIRATORY_TRACT
  Filled 2018-12-17: qty 8.8

## 2018-12-17 MED ORDER — MAGNESIUM SULFATE IN D5W 1-5 GM/100ML-% IV SOLN
1.0000 g | Freq: Once | INTRAVENOUS | Status: AC
  Administered 2018-12-18: 02:00:00 1 g via INTRAVENOUS
  Filled 2018-12-17: qty 100

## 2018-12-17 MED ORDER — SODIUM CHLORIDE 0.9 % IV SOLN: INTRAVENOUS | Status: DC

## 2018-12-17 MED ORDER — ATORVASTATIN CALCIUM 10 MG PO TABS
10.0000 mg | ORAL_TABLET | Freq: Every day | ORAL | Status: DC
  Administered 2018-12-18 – 2018-12-19 (×3): 10 mg via ORAL
  Filled 2018-12-17 (×3): qty 1

## 2018-12-17 MED ORDER — SPIRONOLACTONE 25 MG PO TABS
12.5000 mg | ORAL_TABLET | Freq: Every day | ORAL | Status: DC
  Administered 2018-12-17: 16:00:00 12.5 mg via ORAL
  Filled 2018-12-17: qty 0.5

## 2018-12-17 MED ORDER — MAGNESIUM SULFATE 50 % IJ SOLN: 1.0000 g | Freq: Once | INTRAMUSCULAR | Status: DC

## 2018-12-17 MED ORDER — SODIUM PHOSPHATES 45 MMOLE/15ML IV SOLN
10.0000 mmol | Freq: Once | INTRAVENOUS | Status: AC
  Administered 2018-12-17: 10 mmol via INTRAVENOUS
  Filled 2018-12-17: qty 3.33

## 2018-12-17 MED ORDER — PREDNISONE 10 MG PO TABS
5.0000 mg | ORAL_TABLET | Freq: Every day | ORAL | Status: DC
  Administered 2018-12-17 – 2018-12-20 (×4): 5 mg via ORAL
  Filled 2018-12-17 (×4): qty 1

## 2018-12-17 MED ORDER — LEFLUNOMIDE 20 MG PO TABS
10.0000 mg | ORAL_TABLET | Freq: Every day | ORAL | Status: DC
  Administered 2018-12-17 – 2018-12-20 (×4): 10 mg via ORAL
  Filled 2018-12-17 (×4): qty 0.5

## 2018-12-17 MED ORDER — AMIODARONE HCL 200 MG PO TABS
200.0000 mg | ORAL_TABLET | Freq: Every day | ORAL | Status: DC
  Administered 2018-12-17 – 2018-12-20 (×4): 200 mg via ORAL
  Filled 2018-12-17 (×3): qty 1

## 2018-12-17 MED ORDER — VITAMIN B-1 100 MG PO TABS
100.0000 mg | ORAL_TABLET | Freq: Every day | ORAL | Status: DC
  Administered 2018-12-17 – 2018-12-20 (×4): 100 mg via ORAL
  Filled 2018-12-17 (×4): qty 1

## 2018-12-17 NOTE — ED Notes (Signed)
Pt called out to use restroom. This RN gave him a urinal. Bed locked low. Rail up. Call bell within reach.

## 2018-12-17 NOTE — ED Notes (Signed)
Date and time results received: 12/17/18 1207  Test: Potassium  Critical Value: 2.1  Name of Provider Notified: Dr. Kerman Passey, MD

## 2018-12-17 NOTE — ED Notes (Signed)
Pt ambulated to toilet without assistance, gait steady. Pt ate dinner tray provided to him by his brother.

## 2018-12-17 NOTE — ED Notes (Signed)
Pt presents to the ED due to low K+ levels, pt was called by his PCP. Pt states he has been feeling weak for about a week. Denies pain and Denies NVD. Pt is A&Ox4 at this time and NAD.

## 2018-12-17 NOTE — ED Notes (Signed)
Pt given meal tray and is attempting to eat

## 2018-12-17 NOTE — ED Provider Notes (Signed)
[facility] Emergency Department Provider Note  Time seen: 11:10 AM  I have reviewed the triage vital signs and the nursing notes.   HISTORY  Chief Complaint Abnormal Lab   HPI Jason Fitzgerald is a 68 y.o. male with a past medical history of CHF, COPD, gastric reflux, presents to the emergency department referred by his doctor for low potassium.  According to the patient he was seen yesterday by his doctor for routine lab work.  Patient states he was called back today and told to go the emergency department for very low potassium.  Per record review it appears his potassium was 2.3 yesterday.  Patient found to be hypotensive in the emergency department today currently 84/45 in triage.  Denies any significant weakness or muscle weakness but states he has been feeling dizzy over the past several days.  Denies any new medication changes.  Patient does not know what medications he takes but per record review it appears that he is on spironolactone.  Denies any fever cough or shortness of breath.  Per recent note from PCP he is no longer currently taking Lasix or potassium.  Past Medical History:  Diagnosis Date  . CHF (congestive heart failure) ([place])   . Chronic cough   . COPD (chronic obstructive pulmonary disease) ([place])   . Elevated liver function tests   . Emphysema of lung ([place])   . Fibrosis, pulmonary, interstitial, diffuse ([place])   . GERD (gastroesophageal reflux disease)   . History of cocaine abuse ([place])   . Mediastinal lymphadenopathy   . Pulmonary fibrosis ([place])   . Right inguinal hernia     Patient Active Problem List   Diagnosis Date Noted  . Encounter for screening colonoscopy   . Polyp of transverse colon   . Internal hemorrhoids   . History of alcoholism ([place]) 12/08/2017  . Rheumatoid arthritis involving multiple sites with positive rheumatoid factor ([place]) 10/13/2017  . Atrial fibrillation ([place]) 03/12/2017  . Arthritis of left knee 08/01/2016  .  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 Chronic cough 05/29/2015  . CAFL (chronic airflow limitation) ([place]) 10/24/2014  . Arteriosclerosis of coronary artery 10/24/2014  . History of fundoplication 16/11/9602  . BP (high blood pressure) 10/24/2014  . LAD (lymphadenopathy), mediastinal 07/13/2013  . Interstitial lung disease ([place]) 06/19/2013  . 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 COLONOSCOPY WITH PROPOFOL N/A 08/19/2018   Procedure: COLONOSCOPY WITH PROPOFOL;  Surgeon: Virgel Manifold, MD;  Location: [facility] ENDOSCOPY;  Service: Endoscopy;  Laterality: N/A;  . HERNIA REPAIR Right 1973   open  . INGUINAL HERNIA REPAIR Right 11/08/2014   Procedure: LAPAROSCOPIC RIGHT INGUINAL HERNIA REPAIR;  Surgeon: Hubbard Robinson, MD;  Location: [facility] ORS;  Service: General;  Laterality: Right;  . knee arthroscopy Right 1972  . RIGHT/LEFT HEART CATH AND CORONARY ANGIOGRAPHY Right 03/05/2017   Procedure: RIGHT/LEFT HEART CATH AND CORONARY ANGIOGRAPHY;  Surgeon: Dionisio David, MD;  Location: [facility];  Service: Cardiovascular;  Laterality: Right;    Prior to Admission medications   Medication Sig Start Date End Date Taking? Authorizing Provider  albuterol (PROVENTIL HFA) 108 (90 Base) MCG/ACT inhaler Inhale 2 puffs into the lungs as needed. 02/05/18 12/18/19  Demetrios Loll, MD  amiodarone (PACERONE) 200 MG tablet Take 2 tablets by mouth daily. 05/19/18   Allyne Gee, MD  aspirin 325 MG EC tablet TAKE 1 TABLET BY MOUTH EVERY  DAY 09/18/18   Steele Sizer, MD  atorvastatin (LIPITOR) 10 MG tablet TAKE 1 TABLET BY MOUTH EVERY DAY 10/18/18   Steele Sizer, MD  budesonide-formoterol [facility]) 160-4.5 MCG/ACT inhaler Inhale 2 puffs into the lungs as needed.    [provider]  gabapentin (NEURONTIN) 300 MG capsule Take 1 capsule (300 mg total) by mouth 2 (two) times daily. 06/10/18   Steele Sizer, MD   leflunomide (ARAVA) 10 MG tablet Take by mouth. 10/13/17   [provider]  metoprolol succinate (TOPROL-XL) 100 MG 24 hr tablet Take 100 mg by mouth daily. 10/14/18   [provider]  metoprolol tartrate (LOPRESSOR) 25 MG tablet Take 1 tablet (25 mg total) by mouth 2 (two) times daily. 06/10/18   Steele Sizer, MD  potassium chloride SA (K-DUR,KLOR-CON) 20 MEQ tablet Take 1 tablet (20 mEq total) by mouth daily. 02/05/18   Demetrios Loll, MD  predniSONE (DELTASONE) 5 MG tablet Take 1 tablet by mouth daily. 01/22/17   Emmaline Kluver., MD  spironolactone (ALDACTONE) 25 MG tablet Take 0.5 tablets (12.5 mg total) by mouth daily. 06/28/18 06/28/19  Steele Sizer, MD  thiamine (VITAMIN B-1) 100 MG tablet TAKE 1 TABLET BY MOUTH EVERY DAY 10/10/18   Ancil Boozer, Drue Stager, MD  valsartan (DIOVAN) 80 MG tablet Take 80 mg by mouth daily. 06/02/18   [provider]    No Known Allergies  Family History  Problem Relation Age of Onset  . Diabetes Mother   . Cancer Father   . AAA (abdominal aortic aneurysm) Brother     Social History Social History   Tobacco Use  . Smoking status: Never Smoker  . Smokeless tobacco: Never Used  Substance Use Topics  . Alcohol use: Not Currently    Alcohol/week: 0.0 standard drinks    Comment: 2 quarts a week  . Drug use: Not Currently    Types: Cocaine    Comment: as an young adult     Review of Systems Constitutional: Negative for fever.  Positive for intermittent dizziness. Cardiovascular: Negative for chest pain. Respiratory: Negative for shortness of breath. Gastrointestinal: Negative for abdominal pain, vomiting Musculoskeletal: Negative for musculoskeletal complaints Neurological: Negative for headache All other ROS negative  ____________________________________________   PHYSICAL EXAM:  VITAL SIGNS: ED Triage Vitals  Enc Vitals Group     BP 12/17/18 1057 (!) 84/45     Pulse Rate 12/17/18 1057 79     Resp 12/17/18 1057 18      Temp 12/17/18 1057 99.4 F (37.4 C)     Temp Source 12/17/18 1057 Oral     SpO2 12/17/18 1057 95 %     Weight 12/17/18 1058 188 lb (85.3 kg)     Height 12/17/18 1058 5\' 9"  (1.753 m)     Head Circumference --      Peak Flow --      Pain Score 12/17/18 1057 0     Pain Loc --      Pain Edu? --      Excl. in [place]? --    Constitutional: Alert and oriented. Well appearing and in no distress. Eyes: Normal exam ENT      Head: Normocephalic and atraumatic.      Mouth/Throat: Mucous membranes are moist. Cardiovascular: Normal rate, regular rhythm.  Respiratory: Normal respiratory effort without tachypnea nor retractions. Breath sounds are clear  Gastrointestinal: Soft and nontender. No distention. Musculoskeletal: Nontender with normal range of motion in all extremities.  Mild pedal edema bilaterally. Neurologic:  Normal speech and language. No gross focal neurologic deficits Skin:  Skin is warm, dry and intact.  Psychiatric: Mood and affect are normal.   EKG viewed and interpreted by myself shows what appears to be a sinus rhythm at 70 bpm with a narrow QRS, normal axis, slight QTC prolongation otherwise largely normal intervals nonspecific ST changes. ____________________________________________  INITIAL IMPRESSION / ASSESSMENT AND PLAN / ED COURSE  Pertinent labs & imaging results that were available during my care of the patient were reviewed by me and considered in my medical decision making (see chart for details).   Patient presents to the emergency department for low potassium.  Patient does state intermittent dizziness worse over the past several days.  I reviewed the patient's note from yesterday he appears to be quite hypertensive yesterday x2 blood pressures in the office similar to today's value.  We will check labs, EKG, dose IV fluids and continue to closely monitor the patient.  Differential would include hypotension which could cause dizziness, hypokalemia, no significant  EKG findings, ACS we will check a troponin, dehydration.  Patient's labs have resulted showing a critical low potassium of 2.1.  Remainder of lab work is still pending.  Patient's blood pressure is improving with IV hydration.  We will start the patient on IV and oral potassium.  Patient will require hospitalization given his significant hypokalemia.  Jason Fitzgerald was evaluated in Emergency Department on 12/17/2018 for the symptoms described in the history of present illness. He was evaluated in the context of the global COVID-19 pandemic, which necessitated consideration that the patient might be at risk for infection with the SARS-CoV-2 virus that causes COVID-19. Institutional protocols and algorithms that pertain to the evaluation of patients at risk for COVID-19 are in a state of rapid change based on information released by regulatory bodies including the CDC and federal and state organizations. These policies and algorithms were followed during the patient's care in the ED.  ____________________________________________   FINAL CLINICAL IMPRESSION(S) / ED DIAGNOSES  Hypotension Hypokalemia   Harvest Dark, MD 12/17/18 1208

## 2018-12-17 NOTE — ED Triage Notes (Signed)
Patient presents to the ED for low potassium.  Patient was sent by his MD.  Patient states he has had that issue in the past.

## 2018-12-17 NOTE — H&P (Signed)
History and Physical    [name]:[id number] DOB: 04/11/1950 DOA: 12/17/2018  PCP: Steele Sizer, MD (Confirm with patient/family/NH records and if not entered, this has to be entered at [facility] point of entry) Patient coming from: Home  I have personally briefly reviewed patient's old medical records in [place]  Chief Complaint: Low potassium.  HPI: Jason Fitzgerald is a 68 y.o. male with medical history significant of htn , sent ot [place] from pcp for low potassium and found ot have low blood pressure in ed.  Pt feels fine and says had some occasional headache staggering for off and on for two week. ROS is negative otherwise for any chest pain or sob.   Overall basically 68 year old African-American male referred to [place] from primary care for low potassium that was detected on lab work yesterday when the patient arrived here he was noted to be hypotensive.  Patient then reported that he has been dizzy and has been staggering off and on intermittently for about 2 weeks.  But denied any other complaints. Patient does have a past medical history of COPD/pulmonary fibrosis/CHF.   ED Course:  Blood pressure (!) 84/45, pulse 62, temperature 99.4 F (37.4 C), temperature source Oral, resp. rate (!)18 height 5' 9"  (1.753 m), weight 85.3 kg, SpO2 97 %. Pt states his back bothers him every so often. Pt feels good and again no complaints today.  Patient's initial labs showed low potassium, normal creatinine of 0.69, BUN of 2.7, AST of 109, total bili of 2.0. CBC does show anemia hemoglobin of 11.7, white count of 3.7, platelets of 126.   Review of Systems: As per HPI otherwise 10 point review of systems negative.  Review of Systems  Constitutional: Negative.   HENT: Negative.   Eyes: Negative.   Respiratory: Negative.   Cardiovascular: Negative.   Gastrointestinal: Negative.   Genitourinary: Negative.   Musculoskeletal: Negative.   Skin: Negative.   Neurological: Negative.    Endo/Heme/Allergies: Negative.   Psychiatric/Behavioral: Negative.      Past Medical History:  Diagnosis Date  . CHF (congestive heart failure) ([place])   . Chronic cough   . COPD (chronic obstructive pulmonary disease) ([place])   . Elevated liver function tests   . Emphysema of lung ([place])   . Fibrosis, pulmonary, interstitial, diffuse ([place])   . GERD (gastroesophageal reflux disease)   . History of cocaine abuse ([place])   . Mediastinal lymphadenopathy   . Pulmonary fibrosis ([place])   . Right inguinal hernia     Past Surgical History:  Procedure Laterality Date  . COLONOSCOPY WITH PROPOFOL N/A 08/19/2018   Procedure: COLONOSCOPY WITH PROPOFOL;  Surgeon: Virgel Manifold, MD;  Location: [facility] ENDOSCOPY;  Service: Endoscopy;  Laterality: N/A;  . HERNIA REPAIR Right 1973   open  . INGUINAL HERNIA REPAIR Right 11/08/2014   Procedure: LAPAROSCOPIC RIGHT INGUINAL HERNIA REPAIR;  Surgeon: Hubbard Robinson, MD;  Location: [facility] ORS;  Service: General;  Laterality: Right;  . knee arthroscopy Right 1972  . RIGHT/LEFT HEART CATH AND CORONARY ANGIOGRAPHY Right 03/05/2017   Procedure: RIGHT/LEFT HEART CATH AND CORONARY ANGIOGRAPHY;  Surgeon: Dionisio David, MD;  Location: [place] CV LAB;  Service: Cardiovascular;  Laterality: Right;     reports that he has never smoked. He has never used smokeless tobacco. He reports previous alcohol use. He reports previous drug use. Drug: Cocaine.  No Known Allergies  Family History  Problem Relation Age of Onset  . Diabetes Mother   .  Cancer Father   . AAA (abdominal aortic aneurysm) Brother      Prior to Admission medications   Medication Sig Start Date End Date Taking? Authorizing Provider  albuterol (PROVENTIL HFA) 108 (90 Base) MCG/ACT inhaler Inhale 2 puffs into the lungs as needed. 02/05/18 12/18/19  Demetrios Loll, MD  amiodarone (PACERONE) 200 MG tablet Take 2 tablets by mouth daily. 05/19/18   Allyne Gee, MD  aspirin 325 MG EC tablet TAKE 1  TABLET BY MOUTH EVERY DAY 09/18/18   Ancil Boozer, Drue Stager, MD  atorvastatin (LIPITOR) 10 MG tablet TAKE 1 TABLET BY MOUTH EVERY DAY 10/18/18   Ancil Boozer, Drue Stager, MD  budesonide-formoterol [facility]) 160-4.5 MCG/ACT inhaler Inhale 2 puffs into the lungs as needed.    [provider]  gabapentin (NEURONTIN) 300 MG capsule Take 1 capsule (300 mg total) by mouth 2 (two) times daily. 06/10/18   Steele Sizer, MD  leflunomide (ARAVA) 10 MG tablet Take by mouth. 10/13/17   [provider]  metoprolol succinate (TOPROL-XL) 100 MG 24 hr tablet Take 100 mg by mouth daily. 10/14/18   [provider]  metoprolol tartrate (LOPRESSOR) 25 MG tablet Take 1 tablet (25 mg total) by mouth 2 (two) times daily. 06/10/18   Steele Sizer, MD  potassium chloride SA (K-DUR,KLOR-CON) 20 MEQ tablet Take 1 tablet (20 mEq total) by mouth daily. 02/05/18   Demetrios Loll, MD  predniSONE (DELTASONE) 5 MG tablet Take 1 tablet by mouth daily. 01/22/17   Emmaline Kluver., MD  spironolactone (ALDACTONE) 25 MG tablet Take 0.5 tablets (12.5 mg total) by mouth daily. 06/28/18 06/28/19  Steele Sizer, MD  thiamine (VITAMIN B-1) 100 MG tablet TAKE 1 TABLET BY MOUTH EVERY DAY 10/10/18   Ancil Boozer, Drue Stager, MD  valsartan (DIOVAN) 80 MG tablet Take 80 mg by mouth daily. 06/02/18   [provider]    Physical Exam: Vitals:   12/17/18 1445 12/17/18 1530 12/17/18 1615 12/17/18 1930  BP: (!) 147/71 136/70 133/66 (!) 117/57  Pulse: 66 63 67 67  Resp: (!) 21 18 (!) 23 20  Temp:    98.8 F (37.1 C)  TempSrc:    Oral  SpO2: 98% 99% 99% 98%  Weight:      Height:        Constitutional: NAD, calm, comfortable Vitals:   12/17/18 1445 12/17/18 1530 12/17/18 1615 12/17/18 1930  BP: (!) 147/71 136/70 133/66 (!) 117/57  Pulse: 66 63 67 67  Resp: (!) 21 18 (!) 23 20  Temp:    98.8 F (37.1 C)  TempSrc:    Oral  SpO2: 98% 99% 99% 98%  Weight:      Height:       Eyes: PERRL, lids and conjunctivae normal ENMT: Mucous  membranes are moist. Posterior pharynx clear of any exudate or lesions.Normal dentition.  Neck: normal, supple, no masses, no thyromegaly Respiratory: clear to auscultation bilaterally, no wheezing, no crackles. Normal respiratory effort. No accessory muscle use.  Cardiovascular: Regular rate and rhythm, no murmurs / rubs / gallops. No extremity edema. 2+ pedal pulses. No carotid bruits.  Abdomen: no tenderness, no masses palpated. No hepatosplenomegaly. Bowel sounds positive.  Musculoskeletal: no clubbing / cyanosis. No joint deformity upper and lower extremities. Good ROM, no contractures. Normal muscle tone.  Skin: no rashes, lesions, ulcers. No induration Neurologic: CN 2-12 grossly intact. Sensation intact, DTR normal. Strength 5/5 in all 4.  Psychiatric: Normal judgment and insight. Alert and oriented x 3. Normal mood.  Labs on Admission: I have personally reviewed following labs and imaging studies  CBC: Recent Labs  Lab 12/16/18 0000 12/17/18 1120 12/17/18 1637  WBC 5.1 3.7* 3.4*  NEUTROABS 3,254  --   --   HGB 11.5* 11.7* 10.6*  HCT 35.1* 32.6* 28.8*  MCV 94.1 86.7 87.5  PLT 135* 126* 203*   Basic Metabolic Panel: Recent Labs  Lab 12/16/18 0000 12/17/18 1120 12/17/18 1637  NA 141 139  --   K 2.3* 2.1*  --   CL 96* 95*  --   CO2 36* 30  --   GLUCOSE 117* 104*  --   BUN 7 8  --   CREATININE 0.70 0.69  --   CALCIUM 7.7* 7.5*  --   MG  --  1.7 1.7  PHOS  --   --  2.3*   GFR: Estimated Creatinine Clearance: 98.3 mL/min (by C-G formula based on SCr of 0.69 mg/dL). Liver Function Tests: Recent Labs  Lab 12/16/18 0000 12/17/18 1120  AST 127* 109*  ALT 29 31  ALKPHOS  --  254*  BILITOT 1.2 2.0*  PROT 6.2 6.5  ALBUMIN  --  2.7*   No results for input(s): LIPASE, AMYLASE in the last 168 hours. No results for input(s): AMMONIA in the last 168 hours. Coagulation Profile: No results for input(s): INR, PROTIME in the last 168 hours. Cardiac Enzymes: No  results for input(s): CKTOTAL, CKMB, CKMBINDEX, TROPONINI in the last 168 hours. BNP (last 3 results) No results for input(s): PROBNP in the last 8760 hours. HbA1C: No results for input(s): HGBA1C in the last 72 hours. CBG: No results for input(s): GLUCAP in the last 168 hours. Lipid Profile: Recent Labs    12/16/18 0000  CHOL 176  HDL 113  LDLCALC 46  TRIG 85  CHOLHDL 1.6   Thyroid Function Tests: Recent Labs    12/17/18 1637  TSH 1.122  FREET4 1.06   Anemia Panel: No results for input(s): VITAMINB12, FOLATE, FERRITIN, TIBC, IRON, RETICCTPCT in the last 72 hours. Urine analysis:    Component Value Date/Time   COLORURINE AMBER (A) 12/17/2018 1437   APPEARANCEUR HAZY (A) 12/17/2018 1437   LABSPEC 1.014 12/17/2018 1437   PHURINE 6.0 12/17/2018 1437   GLUCOSEU NEGATIVE 12/17/2018 1437   HGBUR NEGATIVE 12/17/2018 1437   BILIRUBINUR NEGATIVE 12/17/2018 1437   BILIRUBINUR Negative 12/08/2017 0947   KETONESUR 20 (A) 12/17/2018 1437   PROTEINUR 30 (A) 12/17/2018 1437   UROBILINOGEN 1.0 12/08/2017 0947   NITRITE NEGATIVE 12/17/2018 1437   LEUKOCYTESUR NEGATIVE 12/17/2018 1437    Radiological Exams on Admission: No results found.  EKG: Independently reviewed.  EKG shows sinus rhythm rhythm, sinus arrhythmia at the rate of 70, T wave inversions in V2 V3 V4, QT interval is difficult to measure and does say it looks prolonged on the EKG however the T waves are difficult to discern so I think it is an accurate we will however repeat another EKG and monitor patient on telemetry.   Assessment/Plan Again patient admitted from primary care office for hypokalemia found to be hypotensive and dizzy.  Will admit patient with close telemetry monitoring.   Hypokalemia/ Hypophosphatemia/ Hypomagnesemia We will replete patient's potassium level with kcl 40 meq x 1. Patient will be admitted with telemetry monitoring for any electrolyte abnormality associated dysrhythmias.   Sodium phos x  10 mmol, and magnesium 1 gm iv.  Hypotension: Suspect low blood pressure to be related to patient's medication dosages, will admit for  further evaluation of this low blood pressure.  Patient has been having balance issues that he has noted he denies any falls and he reports it to be transient.  Patient received IV fluids in the emergency room As he has positive d-dimer we will also evaluate for PE.  COPD/ Pulmonary fibrosis: Stable.PRN albuterol . Oxygen saturation are normal at room air.  CT chest noncontrast 11/2018: IMPRESSION:  1. 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 [place], Iss 5,  [phone], Oct 03 2016.  2. One vessel coronary atherosclerosis.  3. Prominent symmetric bilateral gynecomastia, worsened.  Aortic Atherosclerosis (ICD10-I70.0).    Atrial fibrillation (HCC)/CHF (congestive heart failure) ([place]) Patient has past medical history of paroxysmal atrial fibrillation currently in sinus rhythm. He does see Dr.shaukat khan for his heart , and is off xarelto which I suspect may be due to his anemia. Last card visit in jan 2020. CHF is stable pt is dry and dizzy. Will hodl his bp meds due to his hypotension.  Abnormal LFTs (liver function tests): Pt has elevated t.bili and alk phos we will get gb/liver usg.  GGT pending.  Anemia:  Occult stools/IV PPI./Iron panel Pt was on xarelto 20 mg but is not. Suspect secondary to it.   Iron studies today . Colonoscopy 08/2018:  Impression:   - One 3 mm polyp in the ascending colon, removed with a                        cold biopsy forceps. Resected and retrieved.                       - One 10 mm polyp in  the transverse colon, removed with                        a hot snare. Resected and retrieved. Injected.                       - Two 6 to 7 mm polyps in the transverse colon, removed                        with a cold snare. Resected and retrieved.                       - The examination was otherwise normal.                       - The rectum, sigmoid colon, descending colon,                        transverse colon, ascending colon and cecum are normal.                       - Non-bleeding internal hemorrhoids.   DVT prophylaxis: Heparin (Lovenox/Heparin/SCD's/anticoagulated/None (if comfort care) Code Status: Full code (Full/Partial (specify details) Family Communication: None at bedside (Specify name, relationship. Do not write "discussed with patient". Specify tel # if discussed over the phone) Disposition Plan: Discharge home in 3 to 4 days (specify when and  where you expect patient to be discharged) Consults called: None (with names) Admission status: Inpatient (inpatient / obs / tele / medical floor / SDU)   Para Skeans MD [facility] Pager [phone]   If 7PM-7AM, please contact night-coverage [email or web address] Password [facility]  12/17/2018, 7:31 PM

## 2018-12-17 NOTE — Progress Notes (Addendum)
ADDENDUM:  1. Hx of Paroxysmal Atrial Fibrillation - Rate controlled on Amiodarone - Not on Xarelto per PCP due to cost of Medication - Consider Cardiology follow up with Dr. Humphrey Rolls to consider Coumadin pending evaluation for occult blood per treatment team. Risk for thromboembolic event  2. Hx of ETOH Abuse ? Last Drink  Patient state in 2020 - Ethanol levels pending - Check INR, Daily BMP+Mg - Daily Thiamine, Folate, MVI once tolerating PO - SW consult for cessation resources - PT/OT evaluation for mobility - CIWA per protocol   3. Elevated LFTs - Hx of Etoh abuse - Consider US abdomen if with RUQ tenderness - Check hepatitis panel - LDH, Lipid profile - Will continue to follow  4. Thrombocytopenia - Likely due to ETOH abuse - No evidence of bleeding or infection    This patient was discussed with admitting Dr. Posey Pronto, who personally reviewed documentation and agreed with addendum plan of care as above.   Rufina Falco, DNP, CCRN, FNP-C [facility] Nurse Practitioner Between 7pm to 7am - Pager [phone] Actively using [employer] secure chat messaging  After 7am go to [email or web address] - [email or web address]  [facility] [employer]  [phone]

## 2018-12-18 DIAGNOSIS — Z7952 Long term (current) use of systemic steroids: Secondary | ICD-10-CM | POA: Diagnosis not present

## 2018-12-18 DIAGNOSIS — R197 Diarrhea, unspecified: Secondary | ICD-10-CM | POA: Diagnosis present

## 2018-12-18 DIAGNOSIS — I4821 Permanent atrial fibrillation: Secondary | ICD-10-CM

## 2018-12-18 DIAGNOSIS — M069 Rheumatoid arthritis, unspecified: Secondary | ICD-10-CM | POA: Diagnosis present

## 2018-12-18 DIAGNOSIS — F1010 Alcohol abuse, uncomplicated: Secondary | ICD-10-CM | POA: Diagnosis present

## 2018-12-18 DIAGNOSIS — Z833 Family history of diabetes mellitus: Secondary | ICD-10-CM | POA: Diagnosis not present

## 2018-12-18 DIAGNOSIS — I110 Hypertensive heart disease with heart failure: Secondary | ICD-10-CM | POA: Diagnosis present

## 2018-12-18 DIAGNOSIS — R42 Dizziness and giddiness: Secondary | ICD-10-CM | POA: Diagnosis present

## 2018-12-18 DIAGNOSIS — Z7982 Long term (current) use of aspirin: Secondary | ICD-10-CM | POA: Diagnosis not present

## 2018-12-18 DIAGNOSIS — Z20828 Contact with and (suspected) exposure to other viral communicable diseases: Secondary | ICD-10-CM | POA: Diagnosis present

## 2018-12-18 DIAGNOSIS — J439 Emphysema, unspecified: Secondary | ICD-10-CM | POA: Diagnosis present

## 2018-12-18 DIAGNOSIS — J84112 Idiopathic pulmonary fibrosis: Secondary | ICD-10-CM | POA: Diagnosis present

## 2018-12-18 DIAGNOSIS — R509 Fever, unspecified: Secondary | ICD-10-CM | POA: Diagnosis present

## 2018-12-18 DIAGNOSIS — M0579 Rheumatoid arthritis with rheumatoid factor of multiple sites without organ or systems involvement: Secondary | ICD-10-CM

## 2018-12-18 DIAGNOSIS — K219 Gastro-esophageal reflux disease without esophagitis: Secondary | ICD-10-CM | POA: Diagnosis present

## 2018-12-18 DIAGNOSIS — I959 Hypotension, unspecified: Secondary | ICD-10-CM | POA: Diagnosis present

## 2018-12-18 DIAGNOSIS — R945 Abnormal results of liver function studies: Secondary | ICD-10-CM | POA: Diagnosis present

## 2018-12-18 DIAGNOSIS — I5022 Chronic systolic (congestive) heart failure: Secondary | ICD-10-CM

## 2018-12-18 DIAGNOSIS — J8410 Pulmonary fibrosis, unspecified: Secondary | ICD-10-CM

## 2018-12-18 DIAGNOSIS — Z79899 Other long term (current) drug therapy: Secondary | ICD-10-CM | POA: Diagnosis not present

## 2018-12-18 DIAGNOSIS — D61818 Other pancytopenia: Secondary | ICD-10-CM | POA: Diagnosis present

## 2018-12-18 DIAGNOSIS — E876 Hypokalemia: Secondary | ICD-10-CM | POA: Diagnosis present

## 2018-12-18 DIAGNOSIS — I480 Paroxysmal atrial fibrillation: Secondary | ICD-10-CM | POA: Diagnosis present

## 2018-12-18 LAB — COMPREHENSIVE METABOLIC PANEL
ALT: 23 U/L (ref 0–44)
AST: 68 U/L — ABNORMAL HIGH (ref 15–41)
Albumin: 2.3 g/dL — ABNORMAL LOW (ref 3.5–5.0)
Alkaline Phosphatase: 204 U/L — ABNORMAL HIGH (ref 38–126)
Anion gap: 10 (ref 5–15)
BUN: 7 mg/dL — ABNORMAL LOW (ref 8–23)
CO2: 30 mmol/L (ref 22–32)
Calcium: 6.9 mg/dL — ABNORMAL LOW (ref 8.9–10.3)
Chloride: 99 mmol/L (ref 98–111)
Creatinine, Ser: 0.63 mg/dL (ref 0.61–1.24)
GFR calc Af Amer: >60 mL/min (ref >60)
GFR calc non Af Amer: >60 mL/min (ref >60)
Glucose, Bld: 111 mg/dL — ABNORMAL HIGH (ref 70–99)
Potassium: 2.1 mmol/L — CL (ref 3.5–5.1)
Sodium: 139 mmol/L (ref 135–145)
Total Bilirubin: 1.3 mg/dL — ABNORMAL HIGH (ref 0.3–1.2)
Total Protein: 5.6 g/dL — ABNORMAL LOW (ref 6.5–8.1)

## 2018-12-18 LAB — PROTIME-INR
INR: 1.1 (ref 0.8–1.2)
Prothrombin Time: 13.7 seconds (ref 11.4–15.2)

## 2018-12-18 LAB — MAGNESIUM: Magnesium: 2 mg/dL (ref 1.7–2.4)

## 2018-12-18 LAB — PHOSPHORUS: Phosphorus: 2.2 mg/dL — ABNORMAL LOW (ref 2.5–4.6)

## 2018-12-18 LAB — TSH: TSH: 2.797 u[IU]/mL (ref 0.350–4.500)

## 2018-12-18 LAB — GAMMA GT: GGT: 318 U/L — ABNORMAL HIGH (ref 7–50)

## 2018-12-18 LAB — VITAMIN B12: Vitamin B-12: 469 pg/mL (ref 180–914)

## 2018-12-18 LAB — FERRITIN: Ferritin: 227 ng/mL (ref 24–336)

## 2018-12-18 MED ORDER — POTASSIUM CHLORIDE 10 MEQ/100ML IV SOLN
10.0000 meq | INTRAVENOUS | Status: AC
  Administered 2018-12-18 (×5): 10 meq via INTRAVENOUS
  Filled 2018-12-18 (×7): qty 100

## 2018-12-18 MED ORDER — POTASSIUM CHLORIDE CRYS ER 20 MEQ PO TBCR
40.0000 meq | EXTENDED_RELEASE_TABLET | Freq: Once | ORAL | Status: AC
  Administered 2018-12-18: 40 meq via ORAL
  Filled 2018-12-18: qty 2

## 2018-12-18 MED ORDER — POTASSIUM CHLORIDE CRYS ER 20 MEQ PO TBCR
20.0000 meq | EXTENDED_RELEASE_TABLET | Freq: Two times a day (BID) | ORAL | Status: DC
  Administered 2018-12-18 (×2): 20 meq via ORAL
  Filled 2018-12-18 (×3): qty 1

## 2018-12-18 MED ORDER — POTASSIUM CHLORIDE 10 MEQ/100ML IV SOLN
10.0000 meq | INTRAVENOUS | Status: AC
  Administered 2018-12-18: 10 meq via INTRAVENOUS
  Filled 2018-12-18: qty 100

## 2018-12-18 MED ORDER — SODIUM CHLORIDE 0.9% FLUSH
10.0000 mL | Freq: Two times a day (BID) | INTRAVENOUS | Status: DC
  Administered 2018-12-18 – 2018-12-20 (×4): 10 mL via INTRAVENOUS

## 2018-12-18 MED ORDER — POTASSIUM PHOSPHATES 15 MMOLE/5ML IV SOLN
40.0000 meq | Freq: Once | INTRAVENOUS | Status: AC
  Administered 2018-12-18: 10:00:00 40 meq via INTRAVENOUS
  Filled 2018-12-18: qty 9.09

## 2018-12-18 MED ORDER — IPRATROPIUM-ALBUTEROL 0.5-2.5 (3) MG/3ML IN SOLN
3.0000 mL | Freq: Three times a day (TID) | RESPIRATORY_TRACT | Status: DC
  Administered 2018-12-18 – 2018-12-19 (×3): 3 mL via RESPIRATORY_TRACT
  Filled 2018-12-18 (×3): qty 3

## 2018-12-18 MED ORDER — ALBUTEROL SULFATE (2.5 MG/3ML) 0.083% IN NEBU: 3.0000 mL | INHALATION_SOLUTION | RESPIRATORY_TRACT | Status: DC | PRN

## 2018-12-18 NOTE — Plan of Care (Signed)
  Problem: Education: Goal: Knowledge of General Education information will improve Description: Including pain rating scale, medication(s)/side effects and non-pharmacologic comfort measures Outcome: Progressing   Problem: Health Behavior/Discharge Planning: Goal: Ability to manage health-related needs will improve Outcome: Progressing   Problem: Clinical Measurements: Goal: Ability to maintain clinical measurements within normal limits will improve Outcome: Progressing Goal: Respiratory complications will improve Outcome: Progressing Goal: Cardiovascular complication will be avoided Outcome: Progressing   Problem: Pain Managment: Goal: General experience of comfort will improve Outcome: Adequate for Discharge

## 2018-12-18 NOTE — Progress Notes (Signed)
Patient ID: Jason Fitzgerald, male   DOB: 1950-11-06, 68 y.o.   MRN: [id number] [facility] PROGRESS NOTE  DONNA SILVERMAN [id number]:[id number] DOB: [birth year] DOA: 12/17/2018 PCP: Steele Sizer, MD  HPI/Subjective: Patient starting to eat.  He states that he did not have an appetite for the past few days and did not eat.  Patient states that he has been having diarrhea for a few days.  No blood in the bowel movements.  Some abdominal cramping.  Nobody else at home sick.  No recent travel out of the country.  No recent antibiotics.  The patient does not complain of any respiratory symptoms.  Objective: Vitals:   12/18/18 0708 12/18/18 0828  BP: (!) 112/57   Pulse: 66 70  Resp: 17 18  Temp: 99.1 F (37.3 C)   SpO2: 97% 97%    Intake/Output Summary (Last 24 hours) at 12/18/2018 1341 Last data filed at 12/18/2018 0951 Gross per 24 hour  Intake 340 ml  Output 125 ml  Net 215 ml   Filed Weights   12/17/18 1058 12/18/18 0500  Weight: 85.3 kg 85.6 kg    ROS: Review of Systems  Constitutional: Negative for chills and fever.  Eyes: Negative for blurred vision.  Respiratory: Negative for cough and shortness of breath.   Cardiovascular: Negative for chest pain.  Gastrointestinal: Positive for diarrhea and nausea. Negative for abdominal pain, constipation and vomiting.  Genitourinary: Negative for dysuria.  Musculoskeletal: Negative for joint pain.  Neurological: Negative for dizziness and headaches.   Exam: Physical Exam  Constitutional: He is oriented to person, place, and time.  HENT:  Nose: No mucosal edema.  Mouth/Throat: No oropharyngeal exudate or posterior oropharyngeal edema.  Eyes: Pupils are equal, round, and reactive to light. Conjunctivae, EOM and lids are normal.  Neck: No JVD present. Carotid bruit is not present. No edema present. No thyroid mass and no thyromegaly present.  Cardiovascular: S1 normal and S2 normal. Exam reveals no gallop.  No murmur  heard. Pulses:      Dorsalis pedis pulses are 2+ on the right side and 2+ on the left side.  Respiratory: No respiratory distress. He has decreased breath sounds in the right lower field and the left lower field. He has no wheezes. He has no rhonchi. He has no rales.  GI: Soft. Bowel sounds are normal. There is no abdominal tenderness.  Musculoskeletal:     Right ankle: He exhibits no swelling.     Left ankle: He exhibits no swelling.  Lymphadenopathy:    He has no cervical adenopathy.  Neurological: He is alert and oriented to person, place, and time. No cranial nerve deficit.  Skin: Skin is warm. No rash noted. Nails show no clubbing.  Psychiatric: He has a normal mood and affect.      Data Reviewed: Basic Metabolic Panel: Recent Labs  Lab 12/16/18 0000 12/17/18 1120 12/17/18 1637 12/18/18 0419  NA 141 139  --  139  K 2.3* 2.1*  --  2.1*  CL 96* 95*  --  99  CO2 36* 30  --  30  GLUCOSE 117* 104*  --  111*  BUN 7 8  --  7*  CREATININE 0.70 0.69  --  0.63  CALCIUM 7.7* 7.5*  --  6.9*  MG  --  1.7 1.7 2.0  PHOS  --   --  2.3* 2.2*   Liver Function Tests: Recent Labs  Lab 12/16/18 0000 12/17/18 1120 12/18/18 0419  AST 127* 109*  68*  ALT 29 31 23   ALKPHOS  --  254* 204*  BILITOT 1.2 2.0* 1.3*  PROT 6.2 6.5 5.6*  ALBUMIN  --  2.7* 2.3*   CBC: Recent Labs  Lab 12/16/18 0000 12/17/18 1120 12/17/18 1637  WBC 5.1 3.7* 3.4*  NEUTROABS 3,254  --   --   HGB 11.5* 11.7* 10.6*  HCT 35.1* 32.6* 28.8*  MCV 94.1 86.7 87.5  PLT 135* 126* 116*   BNP (last 3 results) Recent Labs    02/03/18 1000  BNP 869.0*      Recent Results (from the past 240 hour(s))  SARS CORONAVIRUS 2 (TAT 6-24 HRS) Nasopharyngeal Nasopharyngeal Swab     Status: None   Collection Time: 12/17/18 12:37 PM   Specimen: Nasopharyngeal Swab  Result Value Ref Range Status   SARS Coronavirus 2 NEGATIVE NEGATIVE Final    Comment: (NOTE) SARS-CoV-2 target nucleic acids are NOT DETECTED. The  SARS-CoV-2 RNA is generally detectable in upper and lower respiratory specimens during the acute phase of infection. Negative results do not preclude SARS-CoV-2 infection, do not rule out co-infections with other pathogens, and should not be used as the sole basis for treatment or other patient management decisions. Negative results must be combined with clinical observations, patient history, and epidemiological information. The expected result is Negative. Fact Sheet for Patients: SugarRoll.be Fact Sheet for Healthcare Providers: https://www.woods-mathews.com/ This test is not yet approved or cleared by the [place] FDA and  has been authorized for detection and/or diagnosis of SARS-CoV-2 by FDA under an Emergency Use Authorization (EUA). This EUA will remain  in effect (meaning this test can be used) for the duration of the COVID-19 declaration under Section 56 4(b)(1) of the Act, 21 U.S.C. section 360bbb-3(b)(1), unless the authorization is terminated or revoked sooner. Performed at [facility], [place] [address] [place], [place] [address]      Studies: Ct Angio Chest Pe W Or Wo Contrast  Result Date: 12/17/2018 CLINICAL DATA:  Acute shortness of breath and weakness EXAM: CT ANGIOGRAPHY CHEST WITH CONTRAST TECHNIQUE: Multidetector CT imaging of the chest was performed using the standard protocol during bolus administration of intravenous contrast. Multiplanar CT image reconstructions and MIPs were obtained to evaluate the vascular anatomy. CONTRAST:  51mL OMNIPAQUE IOHEXOL 350 MG/ML SOLN COMPARISON:  11/24/2018 FINDINGS: Cardiovascular: Thoracic aorta demonstrates variant anatomy with the left vertebral artery arising directly from the aorta just after the origin of the left subclavian artery. No aneurysmal dilatation is seen. Mild atherosclerotic calcifications are seen. No cardiac enlargement is seen. Coronary calcifications are  noted. Pulmonary artery is well visualized without intraluminal filling defect to suggest pulmonary embolism. Mediastinum/Nodes: Thoracic inlet is well visualized with mild persistent heterogeneity of the thyroid. Stable mediastinal lymph nodes are noted when compared with the prior exam. Small hilar nodes are seen as well stable from the prior study. The esophagus is within normal limits. Lungs/Pleura: Lungs again demonstrate diffuse basilar predominant fibrotic changes similar to that seen on the prior exam consistent with UIP. No acute superimposed inflammatory changes are noted. Small bilateral pleural effusions are noted. No sizable parenchymal nodules seen. Upper Abdomen: Visualized upper abdomen is within normal limits. Stable cyst is noted within the peripheral aspect of the right lobe of the liver. Musculoskeletal: Mild degenerative changes of the thoracic spine are seen. No bony abnormality is noted. Changes of gynecomastia again and stable. Review of the MIP images confirms the above findings. IMPRESSION: Diffuse fibrotic changes consistent with UIP and  stable from the most recent high-resolution CT scan. No evidence of pulmonary emboli. No acute abnormality is noted. Electronically Signed   By: Inez Catalina M.D.   On: 12/17/2018 20:07    Scheduled Meds: . amiodarone  200 mg Oral Daily  . atorvastatin  10 mg Oral Daily  . gabapentin  300 mg Oral BID  . heparin  5,000 Units Subcutaneous Q8H  . ipratropium-albuterol  3 mL Nebulization TID  . leflunomide  10 mg Oral Daily  . mometasone-formoterol  2 puff Inhalation BID  . pantoprazole (PROTONIX) IV  40 mg Intravenous Q24H  . predniSONE  5 mg Oral Daily  . thiamine  100 mg Oral Daily   Continuous Infusions: . potassium PHOSPHATE IVPB (mEq) 40 mEq (12/18/18 1014)  . sodium chloride      Assessment/Plan:  1. Severe hypokalemia.  Replace potassium and IV and orally. 2. Hypomagnesemia.  This was replaced yesterday and now in the normal  range today. 3. Hypophosphatemia.  Replace with K-Phos today and recheck again tomorrow. 4. Diarrhea, low-grade fever and abdominal cramping and pancytopenia.  Send off stool studies. 5. Hypotension improved with IV fluid.  Holding medications that can lower blood pressure. 6. Rheumatoid arthritis with CT scan of the chest showing diffuse fibrotic changes with usual interstitial pneumonia.  Patient not having any respiratory symptoms.  On chronic prednisone and leflunomide. 7. Chronic systolic congestive heart failure.  Holding Lasix at this time and continue to monitor closely with electrolyte replacements. 8. Atrial fibrillation on amiodarone  Code Status:     Code Status Orders  (From admission, onward)         Start     Ordered   12/17/18 1452  Full code  Continuous     12/17/18 1453        Code Status History    Date Active Date Inactive Code Status Order ID Comments User Context   02/03/2018 1520 02/05/2018 1811 Full Code [id number]  Saundra Shelling, MD ED   03/04/2017 1231 03/06/2017 1707 Full Code [id number]  Demetrios Loll, MD ED   10/26/2014 1128 10/26/2014 1500 Full Code [id number]  Hubbard Robinson, MD Outpatient   Advance Care Planning Activity     Family Communication: Spoke with sister on the phone Disposition Plan: We will need to replace electrolytes prior to disposition  Time spent: 28 minutes.  Reviewed prior outpatient notes radiological and laboratory data.  [place]  [facility] [employer]

## 2018-12-19 DIAGNOSIS — I959 Hypotension, unspecified: Secondary | ICD-10-CM

## 2018-12-19 LAB — PHOSPHORUS: Phosphorus: 2.3 mg/dL — ABNORMAL LOW (ref 2.5–4.6)

## 2018-12-19 LAB — BASIC METABOLIC PANEL
Anion gap: 10 (ref 5–15)
BUN: <5 mg/dL — ABNORMAL LOW (ref 8–23)
CO2: 29 mmol/L (ref 22–32)
Calcium: 6.6 mg/dL — ABNORMAL LOW (ref 8.9–10.3)
Chloride: 103 mmol/L (ref 98–111)
Creatinine, Ser: 0.55 mg/dL — ABNORMAL LOW (ref 0.61–1.24)
GFR calc Af Amer: >60 mL/min (ref >60)
GFR calc non Af Amer: >60 mL/min (ref >60)
Glucose, Bld: 99 mg/dL (ref 70–99)
Potassium: 2.6 mmol/L — CL (ref 3.5–5.1)
Sodium: 142 mmol/L (ref 135–145)

## 2018-12-19 LAB — MAGNESIUM: Magnesium: 1.8 mg/dL (ref 1.7–2.4)

## 2018-12-19 LAB — FERRITIN: Ferritin: 218 ng/mL (ref 24–336)

## 2018-12-19 MED ORDER — POTASSIUM CHLORIDE 20 MEQ PO PACK
40.0000 meq | PACK | Freq: Three times a day (TID) | ORAL | Status: DC
  Administered 2018-12-19: 12:00:00 40 meq via ORAL
  Filled 2018-12-19: qty 2

## 2018-12-19 MED ORDER — POTASSIUM PHOSPHATES 15 MMOLE/5ML IV SOLN
30.0000 mmol | Freq: Once | INTRAVENOUS | Status: AC
  Administered 2018-12-19: 30 mmol via INTRAVENOUS
  Filled 2018-12-19: qty 10

## 2018-12-19 MED ORDER — POTASSIUM CHLORIDE CRYS ER 20 MEQ PO TBCR
40.0000 meq | EXTENDED_RELEASE_TABLET | Freq: Three times a day (TID) | ORAL | Status: DC
  Administered 2018-12-19: 40 meq via ORAL
  Filled 2018-12-19: qty 2

## 2018-12-19 MED ORDER — IPRATROPIUM-ALBUTEROL 0.5-2.5 (3) MG/3ML IN SOLN: 3.0000 mL | RESPIRATORY_TRACT | Status: DC | PRN

## 2018-12-19 MED ORDER — POTASSIUM CHLORIDE 20 MEQ PO PACK
40.0000 meq | PACK | Freq: Three times a day (TID) | ORAL | Status: DC
  Administered 2018-12-19 (×2): 40 meq via ORAL
  Filled 2018-12-19 (×2): qty 2

## 2018-12-19 MED ORDER — PANTOPRAZOLE SODIUM 40 MG PO TBEC
40.0000 mg | DELAYED_RELEASE_TABLET | Freq: Every day | ORAL | Status: DC
  Administered 2018-12-19: 22:00:00 40 mg via ORAL
  Filled 2018-12-19: qty 1

## 2018-12-19 MED ORDER — MAGNESIUM SULFATE 2 GM/50ML IV SOLN
2.0000 g | Freq: Once | INTRAVENOUS | Status: AC
  Administered 2018-12-19: 2 g via INTRAVENOUS
  Filled 2018-12-19: qty 50

## 2018-12-19 MED ORDER — POTASSIUM CHLORIDE 10 MEQ/100ML IV SOLN
10.0000 meq | INTRAVENOUS | Status: AC
  Administered 2018-12-19 (×2): 10 meq via INTRAVENOUS
  Filled 2018-12-19 (×2): qty 100

## 2018-12-19 NOTE — Progress Notes (Signed)
Patient ID: Jason Fitzgerald, male   DOB: [birth year], 68 y.o.   MRN: [id number] [facility] PROGRESS NOTE  ROCKET GUNDERSON OJJ:[id number] DOB: 07/05/1950 DOA: 12/17/2018 PCP: Steele Sizer, MD  HPI/Subjective: Patient states he has not had any further diarrhea.  States his appetite is starting to come back.  Objective: Vitals:   12/19/18 0411 12/19/18 0815  BP: 129/76 (!) 151/86  Pulse: 74 73  Resp: 18 19  Temp: 98 F (36.7 C)   SpO2: 95% 100%    Filed Weights   12/17/18 1058 12/18/18 0500 12/19/18 0411  Weight: 85.3 kg 85.6 kg 88.2 kg    ROS: Review of Systems  Constitutional: Negative for chills and fever.  Eyes: Negative for blurred vision.  Respiratory: Negative for cough and shortness of breath.   Cardiovascular: Negative for chest pain.  Gastrointestinal: Positive for diarrhea and nausea. Negative for abdominal pain, constipation and vomiting.  Genitourinary: Negative for dysuria.  Musculoskeletal: Negative for joint pain.  Neurological: Negative for dizziness and headaches.   Exam: Physical Exam  Constitutional: He is oriented to person, place, and time.  HENT:  Nose: No mucosal edema.  Mouth/Throat: No oropharyngeal exudate or posterior oropharyngeal edema.  Eyes: Pupils are equal, round, and reactive to light. Conjunctivae, EOM and lids are normal.  Neck: No JVD present. Carotid bruit is not present. No edema present. No thyroid mass and no thyromegaly present.  Cardiovascular: S1 normal and S2 normal. Exam reveals no gallop.  No murmur heard. Pulses:      Dorsalis pedis pulses are 2+ on the right side and 2+ on the left side.  Respiratory: No respiratory distress. He has decreased breath sounds in the right lower field and the left lower field. He has no wheezes. He has no rhonchi. He has no rales.  GI: Soft. Bowel sounds are normal. There is no abdominal tenderness.  Musculoskeletal:     Right ankle: He exhibits no swelling.     Left ankle: He  exhibits no swelling.  Lymphadenopathy:    He has no cervical adenopathy.  Neurological: He is alert and oriented to person, place, and time. No cranial nerve deficit.  Skin: Skin is warm. No rash noted. Nails show no clubbing.  Psychiatric: He has a normal mood and affect.      Data Reviewed: Basic Metabolic Panel: Recent Labs  Lab 12/16/18 0000 12/17/18 1120 12/17/18 1637 12/18/18 0419 12/19/18 0414  NA 141 139  --  139 142  K 2.3* 2.1*  --  2.1* 2.6*  CL 96* 95*  --  99 103  CO2 36* 30  --  30 29  GLUCOSE 117* 104*  --  111* 99  BUN 7 8  --  7* <5*  CREATININE 0.70 0.69  --  0.63 0.55*  CALCIUM 7.7* 7.5*  --  6.9* 6.6*  MG  --  1.7 1.7 2.0 1.8  PHOS  --   --  2.3* 2.2* 2.3*   Liver Function Tests: Recent Labs  Lab 12/16/18 0000 12/17/18 1120 12/18/18 0419  AST 127* 109* 68*  ALT 29 31 23   ALKPHOS  --  254* 204*  BILITOT 1.2 2.0* 1.3*  PROT 6.2 6.5 5.6*  ALBUMIN  --  2.7* 2.3*   CBC: Recent Labs  Lab 12/16/18 0000 12/17/18 1120 12/17/18 1637  WBC 5.1 3.7* 3.4*  NEUTROABS 3,254  --   --   HGB 11.5* 11.7* 10.6*  HCT 35.1* 32.6* 28.8*  MCV 94.1 86.7 87.5  PLT 135* 126* 116*   BNP (last 3 results) Recent Labs    02/03/18 1000  BNP 869.0*      Recent Results (from the past 240 hour(s))  SARS CORONAVIRUS 2 (TAT 6-24 HRS) Nasopharyngeal Nasopharyngeal Swab     Status: None   Collection Time: 12/17/18 12:37 PM   Specimen: Nasopharyngeal Swab  Result Value Ref Range Status   SARS Coronavirus 2 NEGATIVE NEGATIVE Final    Comment: (NOTE) SARS-CoV-2 target nucleic acids are NOT DETECTED. The SARS-CoV-2 RNA is generally detectable in upper and lower respiratory specimens during the acute phase of infection. Negative results do not preclude SARS-CoV-2 infection, do not rule out co-infections with other pathogens, and should not be used as the sole basis for treatment or other patient management decisions. Negative results must be combined with  clinical observations, patient history, and epidemiological information. The expected result is Negative. Fact Sheet for Patients: SugarRoll.be Fact Sheet for Healthcare Providers: https://www.woods-mathews.com/ This test is not yet approved or cleared by the [place] FDA and  has been authorized for detection and/or diagnosis of SARS-CoV-2 by FDA under an Emergency Use Authorization (EUA). This EUA will remain  in effect (meaning this test can be used) for the duration of the COVID-19 declaration under Section 56 4(b)(1) of the Act, 21 U.S.C. section 360bbb-3(b)(1), unless the authorization is terminated or revoked sooner. Performed at [facility], [place] [address], [place], [place] [address]      Studies: Ct Angio Chest Pe W Or Wo Contrast  Result Date: 12/17/2018 CLINICAL DATA:  Acute shortness of breath and weakness EXAM: CT ANGIOGRAPHY CHEST WITH CONTRAST TECHNIQUE: Multidetector CT imaging of the chest was performed using the standard protocol during bolus administration of intravenous contrast. Multiplanar CT image reconstructions and MIPs were obtained to evaluate the vascular anatomy. CONTRAST:  62mL OMNIPAQUE IOHEXOL 350 MG/ML SOLN COMPARISON:  11/24/2018 FINDINGS: Cardiovascular: Thoracic aorta demonstrates variant anatomy with the left vertebral artery arising directly from the aorta just after the origin of the left subclavian artery. No aneurysmal dilatation is seen. Mild atherosclerotic calcifications are seen. No cardiac enlargement is seen. Coronary calcifications are noted. Pulmonary artery is well visualized without intraluminal filling defect to suggest pulmonary embolism. Mediastinum/Nodes: Thoracic inlet is well visualized with mild persistent heterogeneity of the thyroid. Stable mediastinal lymph nodes are noted when compared with the prior exam. Small hilar nodes are seen as well stable from the prior study. The  esophagus is within normal limits. Lungs/Pleura: Lungs again demonstrate diffuse basilar predominant fibrotic changes similar to that seen on the prior exam consistent with UIP. No acute superimposed inflammatory changes are noted. Small bilateral pleural effusions are noted. No sizable parenchymal nodules seen. Upper Abdomen: Visualized upper abdomen is within normal limits. Stable cyst is noted within the peripheral aspect of the right lobe of the liver. Musculoskeletal: Mild degenerative changes of the thoracic spine are seen. No bony abnormality is noted. Changes of gynecomastia again and stable. Review of the MIP images confirms the above findings. IMPRESSION: Diffuse fibrotic changes consistent with UIP and stable from the most recent high-resolution CT scan. No evidence of pulmonary emboli. No acute abnormality is noted. Electronically Signed   By: Inez Catalina M.D.   On: 12/17/2018 20:07    Scheduled Meds: . amiodarone  200 mg Oral Daily  . atorvastatin  10 mg Oral Daily  . gabapentin  300 mg Oral BID  . heparin  5,000 Units Subcutaneous Q8H  . leflunomide  10  mg Oral Daily  . mometasone-formoterol  2 puff Inhalation BID  . pantoprazole  40 mg Oral Daily  . potassium chloride  40 mEq Oral TID  . predniSONE  5 mg Oral Daily  . sodium chloride flush  10 mL Intravenous Q12H  . thiamine  100 mg Oral Daily   Continuous Infusions: . potassium chloride    . potassium PHOSPHATE IVPB (in mmol) 30 mmol (12/19/18 0947)  . sodium chloride      Assessment/Plan:  1. Severe hypokalemia.  Replace with IV K-Phos today, oral K Lor and IV KCl runs.  Recheck potassium daily 2. Hypomagnesemia.  Replace IV again today 3. Hypophosphatemia.  Replace with K-Phos today and recheck again tomorrow. 4. Diarrhea, low-grade fever and abdominal cramping and pancytopenia.  Since patient has not had any diarrhea I will discontinue C. difficile testing. 5. Hypotension has resolved 6. Rheumatoid arthritis with CT  scan of the chest showing diffuse fibrotic changes with usual interstitial pneumonia.  Patient not having any respiratory symptoms.  On chronic prednisone and leflunomide. 7. Chronic systolic congestive heart failure.  Holding Lasix at this time and continue to monitor closely with electrolyte replacements. 8. Paroxysmal atrial fibrillation on amiodarone.   Code Status:     Code Status Orders  (From admission, onward)         Start     Ordered   12/17/18 1452  Full code  Continuous     12/17/18 1453        Code Status History    Date Active Date Inactive Code Status Order ID Comments User Context   02/03/2018 1520 02/05/2018 1811 Full Code [id number]  Saundra Shelling, MD ED   03/04/2017 1231 03/06/2017 1707 Full Code [id number]  Demetrios Loll, MD ED   10/26/2014 1128 10/26/2014 1500 Full Code [id number]  Hubbard Robinson, MD Outpatient   Advance Care Planning Activity     Family Communication: Spoke with sister on the phone Disposition Plan: Still working on electrolyte replacement.  Time spent: 27 minutes.   [place]  [facility] [employer]

## 2018-12-19 NOTE — Plan of Care (Signed)
  Problem: Clinical Measurements: Goal: Ability to maintain clinical measurements within normal limits will improve Outcome: Progressing   Problem: Clinical Measurements: Goal: Will remain free from infection Outcome: Progressing   Problem: Clinical Measurements: Goal: Diagnostic test results will improve Outcome: Progressing   

## 2018-12-20 LAB — BASIC METABOLIC PANEL
Anion gap: 8 (ref 5–15)
BUN: <5 mg/dL — ABNORMAL LOW (ref 8–23)
CO2: 28 mmol/L (ref 22–32)
Calcium: 7.3 mg/dL — ABNORMAL LOW (ref 8.9–10.3)
Chloride: 103 mmol/L (ref 98–111)
Creatinine, Ser: 0.56 mg/dL — ABNORMAL LOW (ref 0.61–1.24)
GFR calc Af Amer: >60 mL/min (ref >60)
GFR calc non Af Amer: >60 mL/min (ref >60)
Glucose, Bld: 94 mg/dL (ref 70–99)
Potassium: 3.7 mmol/L (ref 3.5–5.1)
Sodium: 139 mmol/L (ref 135–145)

## 2018-12-20 LAB — MAGNESIUM: Magnesium: 2.3 mg/dL (ref 1.7–2.4)

## 2018-12-20 LAB — PHOSPHORUS: Phosphorus: 2.3 mg/dL — ABNORMAL LOW (ref 2.5–4.6)

## 2018-12-20 MED ORDER — POTASSIUM CHLORIDE CRYS ER 20 MEQ PO TBCR: 20.0000 meq | EXTENDED_RELEASE_TABLET | Freq: Every day | ORAL | 0 refills | Status: DC

## 2018-12-20 MED ORDER — K PHOS MONO-SOD PHOS DI & MONO 155-852-130 MG PO TABS: 500.0000 mg | ORAL_TABLET | Freq: Two times a day (BID) | ORAL | 0 refills | Status: AC

## 2018-12-20 MED ORDER — K PHOS MONO-SOD PHOS DI & MONO 155-852-130 MG PO TABS
500.0000 mg | ORAL_TABLET | Freq: Two times a day (BID) | ORAL | Status: DC
  Administered 2018-12-20: 09:00:00 500 mg via ORAL
  Filled 2018-12-20 (×2): qty 2

## 2018-12-20 MED ORDER — MAGNESIUM OXIDE 400 MG PO CAPS: 400.0000 mg | ORAL_CAPSULE | Freq: Every morning | ORAL | 0 refills | Status: DC

## 2018-12-20 MED ORDER — VALSARTAN 40 MG PO TABS: 80.0000 mg | ORAL_TABLET | Freq: Every day | ORAL | 0 refills | Status: DC

## 2018-12-20 MED ORDER — METOPROLOL SUCCINATE ER 25 MG PO TB24: 25.0000 mg | ORAL_TABLET | Freq: Every day | ORAL | 0 refills | Status: DC

## 2018-12-20 MED ORDER — SPIRONOLACTONE 25 MG PO TABS: 12.5000 mg | ORAL_TABLET | Freq: Every day | ORAL | 0 refills | Status: DC

## 2018-12-20 NOTE — Discharge Summary (Signed)
[facility] [place] NAME: Jason Fitzgerald    MR#:  [id number]  DATE OF BIRTH:  1950-03-22  DATE OF ADMISSION:  12/17/2018 ADMITTING PHYSICIAN: Para Skeans, MD  DATE OF DISCHARGE: 12/20/2018 12:15 PM  PRIMARY CARE PHYSICIAN: Steele Sizer, MD    ADMISSION DIAGNOSIS:  Hypokalemia [E87.6] Weakness [R53.1]  DISCHARGE DIAGNOSIS:  Principal Problem:   Hypokalemia Active Problems:   HTN (hypertension)   Interstitial lung disease (HCC)   AF (paroxysmal atrial fibrillation) (HCC)   Rheumatoid arthritis involving multiple sites with positive rheumatoid factor (HCC)   Pulmonary fibrosis (HCC)   GERD (gastroesophageal reflux disease)   COPD (chronic obstructive pulmonary disease) (HCC)   CHF (congestive heart failure) (HCC)   Dizziness   Hypotension   Hypophosphatemia   Hypomagnesemia   Positive D-dimer   Abnormal LFTs (liver function tests)   Anemia   Diarrhea   SECONDARY DIAGNOSIS:   Past Medical History:  Diagnosis Date  . CHF (congestive heart failure) ([place])   . Chronic cough   . COPD (chronic obstructive pulmonary disease) ([place])   . Elevated liver function tests   . Emphysema of lung ([place])   . Fibrosis, pulmonary, interstitial, diffuse ([place])   . GERD (gastroesophageal reflux disease)   . History of cocaine abuse ([place])   . Mediastinal lymphadenopathy   . Pulmonary fibrosis ([place])   . Right inguinal hernia     HOSPITAL COURSE:   1.  Severe hypokalemia.  It took a few days of replacement but finally back into the normal range.  Patient not having any further diarrhea.  He is feeling stronger.  Continue potassium supplementation upon going home. 2.  Hypomagnesemia.  Replaced into the normal range.  Will prescribe magnesium oxide upon going home. 3.  Hypophosphatemia.  Patient replaced with IV K-Phos 2 days in a row.  Oral supplementation for a few more days upon going home. 4.  Diarrhea with low-grade fever and abdominal  cramping and pancytopenia.  The patient had no bowel movements during the hospital course. 5.  Hypotension.  This has resolved.  Can restart lower dose medications as outpatient. 6.  Rheumatoid arthritis with CT scan of the chest showing diffuse fibrotic changes with usual interstitial pneumonia.  Patient not having any respiratory symptoms.  On chronic prednisone and leflunomide. 7.  Chronic systolic congestive heart failure.  Because of initial hypotension we held all of his usual medications.  These can be restarted as outpatient.  Low-dose Toprol 25 mg daily, low-dose spironolactone 12.5 mg daily and low-dose valsartan 40 mg daily.  Follow-up with Dr. Chancy Milroy cardiology as outpatient. 8.  Paroxysmal atrial fibrillation on amiodarone and aspirin.  Follow-up with Dr. Chancy Milroy as outpatient. 9.  Patient states he is going to stop drinking alcohol.  I told him if he continues to drink alcohol his electrolytes will go low again.  DISCHARGE CONDITIONS:   Satisfactory  CONSULTS OBTAINED:  None  DRUG ALLERGIES:  No Known Allergies  DISCHARGE MEDICATIONS:   Allergies as of 12/20/2018   No Known Allergies     Medication List    STOP taking these medications   gabapentin 300 MG capsule Commonly known as: NEURONTIN     TAKE these medications   albuterol 108 (90 Base) MCG/ACT inhaler Commonly known as: Proventil HFA Inhale 2 puffs into the lungs as needed.   amiodarone 200 MG tablet Commonly known as: PACERONE Take 1 tablet by mouth daily.   aspirin 325 MG EC  tablet TAKE 1 TABLET BY MOUTH EVERY DAY   atorvastatin 10 MG tablet Commonly known as: LIPITOR TAKE 1 TABLET BY MOUTH EVERY DAY   budesonide-formoterol 160-4.5 MCG/ACT inhaler Commonly known as: SYMBICORT Inhale 2 puffs into the lungs as needed.   leflunomide 10 MG tablet Commonly known as: ARAVA Take 10 mg by mouth daily.   Magnesium Oxide 400 MG Caps Take 1 capsule (400 mg total) by mouth every morning.   metoprolol  succinate 25 MG 24 hr tablet Commonly known as: TOPROL-XL Take 1 tablet (25 mg total) by mouth daily. Take with or immediately following a meal. What changed:  medication strength how much to take   phosphorus 155-852-130 MG tablet Commonly known as: K PHOS NEUTRAL Take 2 tablets (500 mg total) by mouth 2 (two) times daily for 3 days.   potassium chloride SA 20 MEQ tablet Commonly known as: KLOR-CON Take 1 tablet (20 mEq total) by mouth daily.   predniSONE 5 MG tablet Commonly known as: DELTASONE Take 5 mg by mouth daily.   spironolactone 25 MG tablet Commonly known as: Aldactone Take 0.5 tablets (12.5 mg total) by mouth daily. What changed: how much to take   thiamine 100 MG tablet Commonly known as: VITAMIN B-1 TAKE 1 TABLET BY MOUTH EVERY DAY   valsartan 40 MG tablet Commonly known as: DIOVAN Take 2 tablets (80 mg total) by mouth daily. What changed: medication strength        DISCHARGE INSTRUCTIONS:   Follow-up PMD 5 days Follow-up with Dr. Ceasar Mons cardiology  If you experience worsening of your admission symptoms, develop shortness of breath, life threatening emergency, suicidal or homicidal thoughts you must seek medical attention immediately by calling 911 or calling your MD immediately  if symptoms less severe.  You Must read complete instructions/literature along with all the possible adverse reactions/side effects for all the Medicines you take and that have been prescribed to you. Take any new Medicines after you have completely understood and accept all the possible adverse reactions/side effects.   Please note  You were cared for by a hospitalist during your hospital stay. If you have any questions about your discharge medications or the care you received while you were in the hospital after you are discharged, you can call the unit and asked to speak with the hospitalist on call if the hospitalist that took care of you is not available. Once you are  discharged, your primary care physician will handle any further medical issues. Please note that NO REFILLS for any discharge medications will be authorized once you are discharged, as it is imperative that you return to your primary care physician (or establish a relationship with a primary care physician if you do not have one) for your aftercare needs so that they can reassess your need for medications and monitor your lab values.    Today   CHIEF COMPLAINT:   Chief Complaint  Patient presents with  . Abnormal Lab    HISTORY OF PRESENT ILLNESS:  Jason Fitzgerald  is a 68 y.o. male came in with low potassium   VITAL SIGNS:  Blood pressure 123/81, pulse 87, temperature 98.4 F (36.9 C), temperature source Oral, resp. rate 15, height 5\' 9"  (1.753 m), weight 87.6 kg, SpO2 99 %.  I/O:    Intake/Output Summary (Last 24 hours) at 12/20/2018 1240 Last data filed at 12/20/2018 0508 Gross per 24 hour  Intake 240 ml  Output 1250 ml  Net -1010 ml  PHYSICAL EXAMINATION:  GENERAL:  68 y.o.-year-old patient lying in the bed with no acute distress.  EYES: Pupils equal, round, reactive to light and accommodation. No scleral icterus. Extraocular muscles intact.  HEENT: Head atraumatic, normocephalic. Oropharynx and nasopharynx clear.  NECK:  Supple, no jugular venous distention. No thyroid enlargement, no tenderness.  LUNGS: Decreased breath sounds bilaterally, no wheezing, rales,rhonchi or crepitation. No use of accessory muscles of respiration.  CARDIOVASCULAR: S1, S2 normal.  2-6 systolic murmurs.  No rubs, or gallops.  ABDOMEN: Soft, non-tender, non-distended. Bowel sounds present. No organomegaly or mass.  EXTREMITIES: No pedal edema, cyanosis, or clubbing.  NEUROLOGIC: Cranial nerves II through XII are intact. Muscle strength 5/5 in all extremities. Sensation intact. Gait not checked.  PSYCHIATRIC: The patient is alert and oriented x 3.  SKIN: No obvious rash, lesion, or ulcer.    DATA REVIEW:   CBC Recent Labs  Lab 12/17/18 1637  WBC 3.4*  HGB 10.6*  HCT 28.8*  PLT 116*    Chemistries  Recent Labs  Lab 12/18/18 0419  12/20/18 0431  NA 139   < > 139  K 2.1*   < > 3.7  CL 99   < > 103  CO2 30   < > 28  GLUCOSE 111*   < > 94  BUN 7*   < > <5*  CREATININE 0.63   < > 0.56*  CALCIUM 6.9*   < > 7.3*  MG 2.0   < > 2.3  AST 68*  --   --   ALT 23  --   --   ALKPHOS 204*  --   --   BILITOT 1.3*  --   --    < > = values in this interval not displayed.     Microbiology Results  Results for orders placed or performed during the hospital encounter of 12/17/18  SARS CORONAVIRUS 2 (TAT 6-24 HRS) Nasopharyngeal Nasopharyngeal Swab     Status: None   Collection Time: 12/17/18 12:37 PM   Specimen: Nasopharyngeal Swab  Result Value Ref Range Status   SARS Coronavirus 2 NEGATIVE NEGATIVE Final    Comment: (NOTE) SARS-CoV-2 target nucleic acids are NOT DETECTED. The SARS-CoV-2 RNA is generally detectable in upper and lower respiratory specimens during the acute phase of infection. Negative results do not preclude SARS-CoV-2 infection, do not rule out co-infections with other pathogens, and should not be used as the sole basis for treatment or other patient management decisions. Negative results must be combined with clinical observations, patient history, and epidemiological information. The expected result is Negative. Fact Sheet for Patients: SugarRoll.be Fact Sheet for Healthcare Providers: https://www.woods-mathews.com/ This test is not yet approved or cleared by the [place] FDA and  has been authorized for detection and/or diagnosis of SARS-CoV-2 by FDA under an Emergency Use Authorization (EUA). This EUA will remain  in effect (meaning this test can be used) for the duration of the COVID-19 declaration under Section 56 4(b)(1) of the Act, 21 U.S.C. section 360bbb-3(b)(1), unless the authorization is  terminated or revoked sooner. Performed at [facility], [place] [address] [place], [place] [address]       Management plans discussed with the patient, family and they are in agreement.  CODE STATUS:     Code Status Orders  (From admission, onward)         Start     Ordered   12/17/18 1452  Full code  Continuous     12/17/18 1453  Code Status History    Date Active Date Inactive Code Status Order ID Comments User Context   02/03/2018 1520 02/05/2018 1811 Full Code [id number]  Saundra Shelling, MD ED   03/04/2017 1231 03/06/2017 1707 Full Code [id number]  Demetrios Loll, MD ED   10/26/2014 1128 10/26/2014 1500 Full Code [id number]  Hubbard Robinson, MD Outpatient   Advance Care Planning Activity      TOTAL TIME TAKING CARE OF THIS PATIENT: 35 minutes.    Loletha Grayer M.D on 12/20/2018 at 12:40 PM  Between 7am to 6pm - Pager - [phone]  After 6pm go to [email or web address] - password [email or web address]  [facility]  CC: Primary care physician; Steele Sizer, MD

## 2018-12-20 NOTE — Plan of Care (Signed)
  Problem: Clinical Measurements: Goal: Ability to maintain clinical measurements within normal limits will improve Outcome: Not Progressing Note: Patient's potassium level is 2.6   Problem: Skin Integrity: Goal: Risk for impaired skin integrity will decrease Outcome: Completed/Met

## 2018-12-20 NOTE — Discharge Instructions (Signed)

## 2018-12-20 NOTE — Evaluation (Signed)
Physical Therapy Evaluation Patient Details Name: Jason Fitzgerald MRN: [id number] DOB: 04-23-1950 Today's Date: 12/20/2018   History of Present Illness  Pt admitted for hypokalemia with complaints of weakness x 1 week. History includes COPD, pulmonary fibrosis, and CHF.  Clinical Impression  Pt is a pleasant 68 year old male who was admitted for hypokalemia. Pt demonstrates all bed mobility/transfers/ambulation at baseline level. Pt anxious to dc home and reports he has all necessary support in place. Pt does not require any further PT needs at this time. Pt will be dc in house and does not require follow up. RN aware. Will dc current orders.     Follow Up Recommendations No PT follow up    Equipment Recommendations  None recommended by PT    Recommendations for Other Services       Precautions / Restrictions Precautions Precautions: Fall Restrictions Weight Bearing Restrictions: No      Mobility  Bed Mobility Overal bed mobility: Independent             General bed mobility comments: safe technique  Transfers Overall transfer level: Independent Equipment used: None             General transfer comment: upright posture without LOB  Ambulation/Gait Ambulation/Gait assistance: Independent Gait Distance (Feet): 60 Feet Assistive device: None Gait Pattern/deviations: WFL(Within Functional Limits)     General Gait Details: ambulated multiple laps in room without AD. Able to navigate turns with steady balance and good speed. LImited to in room ambulation due to isolation precautions.  Stairs            Wheelchair Mobility    Modified Rankin (Stroke Patients Only)       Balance Overall balance assessment: Independent                                           Pertinent Vitals/Pain Pain Assessment: No/denies pain    Home Living Family/patient expects to be discharged to:: Private residence Living Arrangements: Alone   Type  of Home: Apartment Home Access: Level entry     Home Layout: One level Home Equipment: None      Prior Function Level of Independence: Independent         Comments: household ambulator at baseline, reports no falls     Hand Dominance        Extremity/Trunk Assessment   Upper Extremity Assessment Upper Extremity Assessment: Overall WFL for tasks assessed    Lower Extremity Assessment Lower Extremity Assessment: Overall WFL for tasks assessed       Communication   Communication: No difficulties  Cognition Arousal/Alertness: Awake/alert Behavior During Therapy: WFL for tasks assessed/performed Overall Cognitive Status: Within Functional Limits for tasks assessed                                        General Comments      Exercises     Assessment/Plan    PT Assessment Patent does not need any further PT services  PT Problem List         PT Treatment Interventions      PT Goals (Current goals can be found in the Care Plan section)  Acute Rehab PT Goals Patient Stated Goal: to go home PT Goal Formulation: All assessment and education  complete, DC therapy Time For Goal Achievement: 12/20/18 Potential to Achieve Goals: Good    Frequency     Barriers to discharge        Co-evaluation               AM-PAC PT "6 Clicks" Mobility  Outcome Measure Help needed turning from your back to your side while in a flat bed without using bedrails?: None Help needed moving from lying on your back to sitting on the side of a flat bed without using bedrails?: None Help needed moving to and from a bed to a chair (including a wheelchair)?: None Help needed standing up from a chair using your arms (e.g., wheelchair or bedside chair)?: None Help needed to walk in hospital room?: None Help needed climbing 3-5 steps with a railing? : None 6 Click Score: 24    End of Session   Activity Tolerance: Patient tolerated treatment well Patient left:  in chair Nurse Communication: Mobility status PT Visit Diagnosis: Unsteadiness on feet (R26.81);Muscle weakness (generalized) (M62.81)    Time: 4970-2637 PT Time Calculation (min) (ACUTE ONLY): 18 min   Charges:   PT Evaluation $PT Eval Low Complexity: 1 Low          Greggory Stallion, PT, DPT [phone]   Jason Fitzgerald 12/20/2018, 11:50 AM

## 2018-12-21 ENCOUNTER — Telehealth: Payer: Self-pay

## 2018-12-21 ENCOUNTER — Encounter: Payer: Self-pay | Admitting: Family Medicine

## 2018-12-21 LAB — COMPLETE METABOLIC PANEL WITH GFR
AG Ratio: 1.1 (calc) (ref 1.0–2.5)
ALT: 29 U/L (ref 9–46)
AST: 127 U/L — ABNORMAL HIGH (ref 10–35)
Albumin: 3.2 g/dL — ABNORMAL LOW (ref 3.6–5.1)
Alkaline phosphatase (APISO): 269 U/L — ABNORMAL HIGH (ref 35–144)
BUN: 7 mg/dL (ref 7–25)
CO2: 36 mmol/L — ABNORMAL HIGH (ref 20–32)
Calcium: 7.7 mg/dL — ABNORMAL LOW (ref 8.6–10.3)
Chloride: 96 mmol/L — ABNORMAL LOW (ref 98–110)
Creat: 0.7 mg/dL (ref 0.70–1.25)
GFR, Est African American: 114 mL/min/{1.73_m2} (ref >=60)
GFR, Est Non African American: 98 mL/min/{1.73_m2} (ref >=60)
Globulin: 3 g/dL (calc) (ref 1.9–3.7)
Glucose, Bld: 117 mg/dL — ABNORMAL HIGH (ref 65–99)
Potassium: 2.3 mmol/L — CL (ref 3.5–5.3)
Sodium: 141 mmol/L (ref 135–146)
Total Bilirubin: 1.2 mg/dL (ref 0.2–1.2)
Total Protein: 6.2 g/dL (ref 6.1–8.1)

## 2018-12-21 LAB — LIPID PANEL
Cholesterol: 176 mg/dL (ref <200)
HDL: 113 mg/dL (ref >=40)
LDL Cholesterol (Calc): 46 mg/dL (calc)
Non-HDL Cholesterol (Calc): 63 mg/dL (calc) (ref <130)
Total CHOL/HDL Ratio: 1.6 (calc) (ref <5.0)
Triglycerides: 85 mg/dL (ref <150)

## 2018-12-21 LAB — CBC WITH DIFFERENTIAL/PLATELET
Absolute Monocytes: 434 cells/uL (ref 200–950)
Basophils Absolute: 41 cells/uL (ref 0–200)
Basophils Relative: 0.8 %
Eosinophils Absolute: 51 cells/uL (ref 15–500)
Eosinophils Relative: 1 %
HCT: 35.1 % — ABNORMAL LOW (ref 38.5–50.0)
Hemoglobin: 11.5 g/dL — ABNORMAL LOW (ref 13.2–17.1)
Lymphs Abs: 1321 cells/uL (ref 850–3900)
MCH: 30.8 pg (ref 27.0–33.0)
MCHC: 32.8 g/dL (ref 32.0–36.0)
MCV: 94.1 fL (ref 80.0–100.0)
MPV: 10.3 fL (ref 7.5–12.5)
Monocytes Relative: 8.5 %
Neutro Abs: 3254 cells/uL (ref 1500–7800)
Neutrophils Relative %: 63.8 %
Platelets: 135 10*3/uL — ABNORMAL LOW (ref 140–400)
RBC: 3.73 10*6/uL — ABNORMAL LOW (ref 4.20–5.80)
RDW: 15.4 % — ABNORMAL HIGH (ref 11.0–15.0)
Total Lymphocyte: 25.9 %
WBC: 5.1 10*3/uL (ref 3.8–10.8)

## 2018-12-21 LAB — VITAMIN B1: Vitamin B1 (Thiamine): 22 nmol/L (ref 8–30)

## 2018-12-21 NOTE — Telephone Encounter (Signed)
Transition Care Management Follow-up Telephone Call  Date of discharge and from where: 12/20/18 [facility]  How have you been since you were released from the hospital? Pt states he is still feeling sluggish but hoping to start feeling better soon. Plans to start new medications this morning.   Any questions or concerns? No   Items Reviewed:  Did the pt receive and understand the discharge instructions provided? Yes   Medications obtained and verified? Yes  - pt declined my offer to go through medication list because he had just woken up. Advised to bring all medications to hospital follow up appointment.   Any new allergies since your discharge? No   Dietary orders reviewed? Yes  Do you have support at home? Yes   Functional Questionnaire: (I = Independent and D = Dependent) ADLs: I  Bathing/Dressing- I  Meal Prep- I  Eating- I  Maintaining continence- I  Transferring/Ambulation- I  Managing Meds- I  Follow up appointments reviewed:   PCP Hospital f/u appt confirmed? Yes  Scheduled to see Dr. Ancil Boozer on 12/27/18 @ 11:40.  [facility] f/u appt confirmed? Yes  Scheduled to see Dr. Humphrey Rolls (cardiology) on 12/27/18 @ 10:45.  Are transportation arrangements needed? No   If their condition worsens, is the pt aware to call PCP or go to the Emergency Dept.? Yes  Was the patient provided with contact information for the PCP's office or ED? Yes  Was to pt encouraged to call back with questions or concerns? Yes

## 2018-12-21 NOTE — Telephone Encounter (Signed)
Spoke with pt and he wanted me to contact his sister Regino Schultze. I tried to contact her and had to leave a message for her to return call. Dr Ancil Boozer said that pt had appointment conflict and his appointment here need to be changed.

## 2018-12-22 DIAGNOSIS — J84112 Idiopathic pulmonary fibrosis: Secondary | ICD-10-CM | POA: Diagnosis not present

## 2018-12-22 DIAGNOSIS — I5032 Chronic diastolic (congestive) heart failure: Secondary | ICD-10-CM | POA: Diagnosis not present

## 2018-12-22 DIAGNOSIS — M0579 Rheumatoid arthritis with rheumatoid factor of multiple sites without organ or systems involvement: Secondary | ICD-10-CM | POA: Diagnosis not present

## 2018-12-23 LAB — DRUG SCREEN 10 W/CONF, SERUM
Amphetamines, IA: NEGATIVE ng/mL
Barbiturates, IA: NEGATIVE ug/mL
Benzodiazepines, IA: NEGATIVE ng/mL
Cocaine & Metabolite, IA: NEGATIVE ng/mL
Methadone, IA: NEGATIVE ng/mL
Opiates, IA: NEGATIVE ng/mL
Oxycodones, IA: NEGATIVE ng/mL
Phencyclidine, IA: NEGATIVE ng/mL
Propoxyphene, IA: NEGATIVE ng/mL
THC(Marijuana) Metabolite, IA: NEGATIVE ng/mL

## 2018-12-23 LAB — THC,MS,WB/SP RFX
Cannabidiol: NEGATIVE ng/mL
Cannabinoid Confirmation: NEGATIVE
Cannabinol: NEGATIVE ng/mL
Carboxy-THC: NEGATIVE ng/mL
Hydroxy-THC: NEGATIVE ng/mL
Tetrahydrocannabinol(THC): NEGATIVE ng/mL

## 2018-12-26 ENCOUNTER — Inpatient Hospital Stay: Payer: Medicare Other | Admitting: Family Medicine

## 2018-12-26 DIAGNOSIS — I509 Heart failure, unspecified: Secondary | ICD-10-CM | POA: Diagnosis not present

## 2018-12-26 DIAGNOSIS — I4891 Unspecified atrial fibrillation: Secondary | ICD-10-CM | POA: Diagnosis not present

## 2018-12-26 DIAGNOSIS — I2510 Atherosclerotic heart disease of native coronary artery without angina pectoris: Secondary | ICD-10-CM | POA: Diagnosis not present

## 2018-12-26 DIAGNOSIS — I4892 Unspecified atrial flutter: Secondary | ICD-10-CM | POA: Diagnosis not present

## 2018-12-26 DIAGNOSIS — R0602 Shortness of breath: Secondary | ICD-10-CM | POA: Diagnosis not present

## 2018-12-26 DIAGNOSIS — I10 Essential (primary) hypertension: Secondary | ICD-10-CM | POA: Diagnosis not present

## 2018-12-27 ENCOUNTER — Encounter: Payer: Self-pay | Admitting: Family Medicine

## 2018-12-27 ENCOUNTER — Other Ambulatory Visit: Payer: Self-pay

## 2018-12-27 ENCOUNTER — Ambulatory Visit (INDEPENDENT_AMBULATORY_CARE_PROVIDER_SITE_OTHER): Payer: Medicare Other | Admitting: Family Medicine

## 2018-12-27 VITALS — BP 110/68 | HR 67 | Temp 96.9°F | Resp 16 | Ht 69.0 in | Wt 192.0 lb

## 2018-12-27 DIAGNOSIS — F1020 Alcohol dependence, uncomplicated: Secondary | ICD-10-CM

## 2018-12-27 DIAGNOSIS — E876 Hypokalemia: Secondary | ICD-10-CM | POA: Diagnosis not present

## 2018-12-27 DIAGNOSIS — D61818 Other pancytopenia: Secondary | ICD-10-CM | POA: Diagnosis not present

## 2018-12-27 DIAGNOSIS — Z09 Encounter for follow-up examination after completed treatment for conditions other than malignant neoplasm: Secondary | ICD-10-CM | POA: Diagnosis not present

## 2018-12-27 NOTE — Progress Notes (Signed)
Name: Jason Fitzgerald   MRN: [id number]    DOB: 1951-01-18   Date:12/27/2018       Progress Note  Subjective  Chief Complaint  Chief Complaint  Patient presents with  . Hospitalization Follow-up    Stated he had hypokalemia  . Extremity Weakness    HPI  Hospital discharge follow up: he was found to have severe hypokalemia, he had seen me but did not tell me about having diarrhea, we noted his bp was low but he denied any dizziness. He had to stay a few days because it took a long time to correct his level, he also had low magnesium and phosphorus. He was sent home on potassium tablets, oral magnesium and phosphorus . Reviewed records with the patient and also the medications he was supposed to take upon discharge. He was not taking it correctly. He states he is feeling well, he denies any diarrhea, he states since discharged from hospital he stopped drinking alcohol again. He is a very poor historian. He states he is feeling fine   Patient Active Problem List   Diagnosis Date Noted  . Diarrhea   . Dizziness 12/17/2018  . Hypotension 12/17/2018  . Hypophosphatemia 12/17/2018  . Hypomagnesemia 12/17/2018  . Positive D-dimer 12/17/2018  . Abnormal LFTs (liver function tests) 12/17/2018  . Anemia 12/17/2018  . Pulmonary fibrosis ([place])   . GERD (gastroesophageal reflux disease)   . COPD (chronic obstructive pulmonary disease) ([place])   . CHF (congestive heart failure) ([place])   . Encounter for screening colonoscopy   . Polyp of transverse colon   . Internal hemorrhoids   . Hypokalemia 02/03/2018  . History of alcoholism ([place]) 12/08/2017  . Rheumatoid arthritis involving multiple sites with positive rheumatoid factor ([place]) 10/13/2017  . Chronic systolic CHF (congestive heart failure) ([place]) 03/12/2017  . AF (paroxysmal atrial fibrillation) ([place]) 03/12/2017  . Arthritis of left knee 08/01/2016  . 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 Chronic cough 05/29/2015  . CAFL  (chronic airflow limitation) ([place]) 10/24/2014  . Arteriosclerosis of coronary artery 10/24/2014  . History of fundoplication 00/76/2263  . HTN (hypertension) 10/24/2014  . LAD (lymphadenopathy), mediastinal 07/13/2013  . Interstitial lung disease ([place]) 06/19/2013  . 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 COLONOSCOPY WITH PROPOFOL N/A 08/19/2018   Procedure: COLONOSCOPY WITH PROPOFOL;  Surgeon: Virgel Manifold, MD;  Location: [facility] ENDOSCOPY;  Service: Endoscopy;  Laterality: N/A;  . HERNIA REPAIR Right 1973   open  . INGUINAL HERNIA REPAIR Right 11/08/2014   Procedure: LAPAROSCOPIC RIGHT INGUINAL HERNIA REPAIR;  Surgeon: Hubbard Robinson, MD;  Location: [facility] ORS;  Service: General;  Laterality: Right;  . knee arthroscopy Right 1972  . RIGHT/LEFT HEART CATH AND CORONARY ANGIOGRAPHY Right 03/05/2017   Procedure: RIGHT/LEFT HEART CATH AND CORONARY ANGIOGRAPHY;  Surgeon: Dionisio David, MD;  Location: [place] [facility];  Service: Cardiovascular;  Laterality: Right;    Family History  Problem Relation Age of Onset  . Diabetes Mother   . Cancer Father   . AAA (abdominal aortic aneurysm) Brother     Social History   Socioeconomic History  . Marital status: Divorced    Spouse name: Not on file  . Number of children: 4  . Years of education: Not on file  . Highest education level: Not on file  Occupational History  . Occupation: retired   Scientific laboratory technician  . Financial resource strain: Not hard  at all  . Food insecurity    Worry: Never true    Inability: Never true  . Transportation needs    Medical: No    Non-medical: No  Tobacco Use  . Smoking status: Never Smoker  . Smokeless tobacco: Never Used  Substance and Sexual Activity  . Alcohol use: Not Currently    Alcohol/week: 0.0 standard drinks    Comment: 2 quarts a week  . Drug use: Not Currently    Types: Cocaine    Comment: as an young adult   .  Sexual activity: Not Currently  Lifestyle  . Physical activity    Days per week: 0 days    Minutes per session: 0 min  . Stress: Not at all  Relationships  . Social connections    Talks on phone: More than three times a week    Gets together: More than three times a week    Attends religious service: Never    Active member of club or organization: No    Attends meetings of clubs or organizations: Never    Relationship status: Divorced  . Intimate partner violence    Fear of current or ex partner: No    Emotionally abused: No    Physically abused: No    Forced sexual activity: No  Other Topics Concern  . Not on file  Social History Narrative   Retired Summer of 2019   Lives alone     Current Outpatient Medications:  .  albuterol (PROVENTIL HFA) 108 (90 Base) MCG/ACT inhaler, Inhale 2 puffs into the lungs as needed., Disp: 1 Inhaler, Rfl: 2 .  amiodarone (PACERONE) 200 MG tablet, Take 1 tablet by mouth daily. , Disp: , Rfl:  .  aspirin 325 MG EC tablet, TAKE 1 TABLET BY MOUTH EVERY DAY, Disp: 90 tablet, Rfl: 1 .  atorvastatin (LIPITOR) 10 MG tablet, TAKE 1 TABLET BY MOUTH EVERY DAY, Disp: 30 tablet, Rfl: 3 .  budesonide-formoterol (SYMBICORT) 160-4.5 MCG/ACT inhaler, Inhale 2 puffs into the lungs as needed., Disp: , Rfl:  .  leflunomide (ARAVA) 10 MG tablet, Take 10 mg by mouth daily. , Disp: , Rfl:  .  metoprolol succinate (TOPROL-XL) 25 MG 24 hr tablet, Take 1 tablet (25 mg total) by mouth daily. Take with or immediately following a meal. (Patient taking differently: Take 25 mg by mouth 2 (two) times daily. Take with or immediately following a meal. ), Disp: 30 tablet, Rfl: 0 .  potassium chloride SA (KLOR-CON) 20 MEQ tablet, Take 1 tablet (20 mEq total) by mouth daily., Disp: 30 tablet, Rfl: 0 .  predniSONE (DELTASONE) 5 MG tablet, Take 10 mg by mouth daily. , Disp: , Rfl:  .  spironolactone (ALDACTONE) 25 MG tablet, Take 0.5 tablets (12.5 mg total) by mouth daily. (Patient  taking differently: Take 25 mg by mouth daily. ), Disp: 15 tablet, Rfl: 0 .  thiamine (VITAMIN B-1) 100 MG tablet, TAKE 1 TABLET BY MOUTH EVERY DAY, Disp: 30 tablet, Rfl: 1 .  valsartan (DIOVAN) 40 MG tablet, Take 2 tablets (80 mg total) by mouth daily., Disp: 30 tablet, Rfl: 0 .  Magnesium Oxide 400 MG CAPS, Take 1 capsule (400 mg total) by mouth every morning. (Patient not taking: Reported on 12/27/2018), Disp: 30 capsule, Rfl: 0  No Known Allergies  I personally reviewed active problem list, medication list, allergies, family history, social history, health maintenance with the patient/caregiver today.   ROS  Constitutional: Negative for fever or weight change.  Respiratory: Negative for cough and shortness of breath.   Cardiovascular: Negative for chest pain or palpitations.  Gastrointestinal: Negative for abdominal pain, no bowel changes.  Musculoskeletal: Negative for gait problem or joint swelling.  Skin: Negative for rash.  Neurological: Negative for dizziness or headache.  No other specific complaints in a complete review of systems (except as listed in HPI above).  Objective  Vitals:   12/27/18 1153  BP: 110/68  Pulse: 67  Resp: 16  Temp: (!) 96.9 F (36.1 C)  TempSrc: Temporal  SpO2: 98%  Weight: 192 lb (87.1 kg)  Height: 5\' 9"  (1.753 m)    Body mass index is 28.35 kg/m.  Physical Exam  Constitutional: Patient appears well-developed and well-nourished. Obese  No distress.  HEENT: head atraumatic, normocephalic, pupils equal and reactive to light Cardiovascular: Normal rate, regular rhythm and normal heart sounds.  No murmur heard. No BLE edema. Pulmonary/Chest: Effort normal and breath sounds normal. No respiratory distress. Abdominal: Soft.  There is no tenderness. Psychiatric: Patient has a normal mood and affect. behavior is normal. Judgment and thought content normal.  Recent Results (from the past 2160 hour(s))  CBC with Differential/Platelet      Status: Abnormal   Collection Time: 12/16/18 12:00 AM  Result Value Ref Range   WBC 5.1 3.8 - 10.8 Thousand/uL   RBC 3.73 (L) 4.20 - 5.80 Million/uL   Hemoglobin 11.5 (L) 13.2 - 17.1 g/dL   HCT 35.1 (L) 38.5 - 50.0 %   MCV 94.1 80.0 - 100.0 fL   MCH 30.8 27.0 - 33.0 pg   MCHC 32.8 32.0 - 36.0 g/dL   RDW 15.4 (H) 11.0 - 15.0 %   Platelets 135 (L) 140 - 400 Thousand/uL   MPV 10.3 7.5 - 12.5 fL   Neutro Abs 3,254 1,500 - 7,800 cells/uL   Lymphs Abs 1,321 850 - 3,900 cells/uL   Absolute Monocytes 434 200 - 950 cells/uL   Eosinophils Absolute 51 15 - 500 cells/uL   Basophils Absolute 41 0 - 200 cells/uL   Neutrophils Relative % 63.8 %   Total Lymphocyte 25.9 %   Monocytes Relative 8.5 %   Eosinophils Relative 1.0 %   Basophils Relative 0.8 %  COMPLETE METABOLIC PANEL WITH GFR     Status: Abnormal   Collection Time: 12/16/18 12:00 AM  Result Value Ref Range   Glucose, Bld 117 (H) 65 - 99 mg/dL    Comment: .            Fasting reference interval . For someone without known diabetes, a glucose value between 100 and 125 mg/dL is consistent with prediabetes and should be confirmed with a follow-up test. .    BUN 7 7 - 25 mg/dL   Creat 0.70 0.70 - 1.25 mg/dL    Comment: For patients >82 years of age, the reference limit for Creatinine is approximately 13% higher for people identified as African-American. .    GFR, Est Non African American 98 > OR = 60 mL/min/1.81m2   GFR, Est African American 114 > OR = 60 mL/min/1.48m2   BUN/Creatinine Ratio NOT APPLICABLE 6 - 22 (calc)   Sodium 141 135 - 146 mmol/L   Potassium 2.3 (LL) 3.5 - 5.3 mmol/L    Comment: Verified by repeat analysis. .    Chloride 96 (L) 98 - 110 mmol/L   CO2 36 (H) 20 - 32 mmol/L   Calcium 7.7 (L) 8.6 - 10.3 mg/dL   Total Protein 6.2 6.1 -  8.1 g/dL   Albumin 3.2 (L) 3.6 - 5.1 g/dL   Globulin 3.0 1.9 - 3.7 g/dL (calc)   AG Ratio 1.1 1.0 - 2.5 (calc)   Total Bilirubin 1.2 0.2 - 1.2 mg/dL   Alkaline  phosphatase (APISO) 269 (H) 35 - 144 U/L   AST 127 (H) 10 - 35 U/L   ALT 29 9 - 46 U/L  Vitamin B1     Status: None   Collection Time: 12/16/18 12:00 AM  Result Value Ref Range   Vitamin B1 (Thiamine) 22 8 - 30 nmol/L    Comment: Marland Kitchen Vitamin supplementation within 24 hours prior to blood draw may affect the accuracy of the results. . This test was developed and its analytical performance characteristics have been determined by [place], [place]. It has not been cleared or approved by the U.S. Food and Drug Administration. This assay has been validated pursuant to the CLIA regulations and is used for clinical purposes. .   Lipid panel     Status: None   Collection Time: 12/16/18 12:00 AM  Result Value Ref Range   Cholesterol 176 <200 mg/dL   HDL 113 > OR = 40 mg/dL   Triglycerides 85 <150 mg/dL   LDL Cholesterol (Calc) 46 mg/dL (calc)    Comment: Reference range: <100 . Desirable range <100 mg/dL for primary prevention;   <70 mg/dL for patients with CHD or diabetic patients  with > or = 2 CHD risk factors. Marland Kitchen LDL-C is now calculated using the Martin-Hopkins  calculation, which is a validated novel method providing  better accuracy than the Friedewald equation in the  estimation of LDL-C.  Cresenciano Genre et al. Annamaria Helling. 1287;867(67): 2061-2068  (http://education.Quest[employer].com/faq/FAQ164)    Total CHOL/HDL Ratio 1.6 <5.0 (calc)   Non-HDL Cholesterol (Calc) 63 <130 mg/dL (calc)    Comment: For patients with diabetes plus 1 major ASCVD risk  factor, treating to a non-HDL-C goal of <100 mg/dL  (LDL-C of <70 mg/dL) is considered a therapeutic  option.   Basic metabolic panel     Status: Abnormal   Collection Time: 12/17/18 12:00 AM  Result Value Ref Range   Glucose 117    BUN 7 4 - 21   Potassium 2.3 (A) 3.4 - 5.3   Sodium 141 137 - 147   Chloride 96 (A) 99 - 108  Lipid panel     Status: Abnormal   Collection Time: 12/17/18 12:00 AM   Result Value Ref Range   Triglycerides 85 40 - 160   Cholesterol 176 0 - 200   HDL 113 (A) 35 - 70   LDL Cholesterol 46   CBC     Status: Abnormal   Collection Time: 12/17/18 11:20 AM  Result Value Ref Range   WBC 3.7 (L) 4.0 - 10.5 K/uL   RBC 3.76 (L) 4.22 - 5.81 MIL/uL   Hemoglobin 11.7 (L) 13.0 - 17.0 g/dL   HCT 32.6 (L) 39.0 - 52.0 %   MCV 86.7 80.0 - 100.0 fL   MCH 31.1 26.0 - 34.0 pg   MCHC 35.9 30.0 - 36.0 g/dL   RDW 15.5 11.5 - 15.5 %   Platelets 126 (L) 150 - 400 K/uL    Comment: Immature Platelet Fraction may be clinically indicated, consider ordering this additional test [id number]    nRBC 0.0 0.0 - 0.2 %    Comment: Performed at [facility], [address] [place], [place] [address]  CMP  Status: Abnormal   Collection Time: 12/17/18 11:20 AM  Result Value Ref Range   Sodium 139 135 - 145 mmol/L   Potassium 2.1 (LL) 3.5 - 5.1 mmol/L    Comment: CRITICAL RESULT CALLED TO, READ BACK BY AND VERIFIED WITH ASHLEY MURRAY @1159  12/17/18 MJU    Chloride 95 (L) 98 - 111 mmol/L   CO2 30 22 - 32 mmol/L   Glucose, Bld 104 (H) 70 - 99 mg/dL   BUN 8 8 - 23 mg/dL   Creatinine, Ser 0.69 0.61 - 1.24 mg/dL   Calcium 7.5 (L) 8.9 - 10.3 mg/dL   Total Protein 6.5 6.5 - 8.1 g/dL   Albumin 2.7 (L) 3.5 - 5.0 g/dL   AST 109 (H) 15 - 41 U/L   ALT 31 0 - 44 U/L   Alkaline Phosphatase 254 (H) 38 - 126 U/L   Total Bilirubin 2.0 (H) 0.3 - 1.2 mg/dL   GFR calc non Af Amer >60 >60 mL/min   GFR calc Af Amer >60 >60 mL/min   Anion gap 14 5 - 15    Comment: Performed at [facility], [address] [place], [place] [address]  Magnesium     Status: None   Collection Time: 12/17/18 11:20 AM  Result Value Ref Range   Magnesium 1.7 1.7 - 2.4 mg/dL    Comment: Performed at [facility], [place], [place] [address]  Troponin I (High Sensitivity)     Status: Abnormal   Collection Time: 12/17/18 11:20 AM  Result Value Ref Range   Troponin I  (High Sensitivity) 26 (H) <18 ng/L    Comment: (NOTE) Elevated high sensitivity troponin I (hsTnI) values and significant  changes across serial measurements may suggest ACS but many other  chronic and acute conditions are known to elevate hsTnI results.  Refer to the "Links" section for chest pain algorithms and additional  guidance. Performed at [facility], [place], [place] [address]   SARS CORONAVIRUS 2 (TAT 6-24 HRS) Nasopharyngeal Nasopharyngeal Swab     Status: None   Collection Time: 12/17/18 12:37 PM   Specimen: Nasopharyngeal Swab  Result Value Ref Range   SARS Coronavirus 2 NEGATIVE NEGATIVE    Comment: (NOTE) SARS-CoV-2 target nucleic acids are NOT DETECTED. The SARS-CoV-2 RNA is generally detectable in upper and lower respiratory specimens during the acute phase of infection. Negative results do not preclude SARS-CoV-2 infection, do not rule out co-infections with other pathogens, and should not be used as the sole basis for treatment or other patient management decisions. Negative results must be combined with clinical observations, patient history, and epidemiological information. The expected result is Negative. Fact Sheet for Patients: SugarRoll.be Fact Sheet for Healthcare Providers: https://www.woods-mathews.com/ This test is not yet approved or cleared by the [place] FDA and  has been authorized for detection and/or diagnosis of SARS-CoV-2 by FDA under an Emergency Use Authorization (EUA). This EUA will remain  in effect (meaning this test can be used) for the duration of the COVID-19 declaration under Section 56 4(b)(1) of the Act, 21 U.S.C. section 360bbb-3(b)(1), unless the authorization is terminated or revoked sooner. Performed at [facility], [place] [address], [place], [place] [address]   Urinalysis, Complete w Microscopic     Status: Abnormal   Collection Time: 12/17/18   2:37 PM  Result Value Ref Range   Color, Urine AMBER (A) YELLOW    Comment: BIOCHEMICALS MAY BE AFFECTED BY COLOR  APPearance HAZY (A) CLEAR   Specific Gravity, Urine 1.014 1.005 - 1.030   pH 6.0 5.0 - 8.0   Glucose, UA NEGATIVE NEGATIVE mg/dL   Hgb urine dipstick NEGATIVE NEGATIVE   Bilirubin Urine NEGATIVE NEGATIVE   Ketones, ur 20 (A) NEGATIVE mg/dL   Protein, ur 30 (A) NEGATIVE mg/dL   Nitrite NEGATIVE NEGATIVE   Leukocytes,Ua NEGATIVE NEGATIVE   RBC / HPF 0-5 0 - 5 RBC/hpf   WBC, UA 6-10 0 - 5 WBC/hpf   Bacteria, UA RARE (A) NONE SEEN   Squamous Epithelial / LPF 0-5 0 - 5   Mucus PRESENT    Hyaline Casts, UA PRESENT     Comment: Performed at [facility], [address] [place], [place] [address]  HIV Antibody (routine testing w rflx)     Status: None   Collection Time: 12/17/18  4:37 PM  Result Value Ref Range   HIV Screen 4th Generation wRfx NON REACTIVE NON REACTIVE    Comment: Performed at [facility] [address], [place], [place] [address]  CBC     Status: Abnormal   Collection Time: 12/17/18  4:37 PM  Result Value Ref Range   WBC 3.4 (L) 4.0 - 10.5 K/uL   RBC 3.29 (L) 4.22 - 5.81 MIL/uL   Hemoglobin 10.6 (L) 13.0 - 17.0 g/dL   HCT 28.8 (L) 39.0 - 52.0 %   MCV 87.5 80.0 - 100.0 fL   MCH 32.2 26.0 - 34.0 pg   MCHC 36.8 (H) 30.0 - 36.0 g/dL   RDW 15.3 11.5 - 15.5 %   Platelets 116 (L) 150 - 400 K/uL    Comment: Immature Platelet Fraction may be clinically indicated, consider ordering this additional test [id number]    nRBC 0.0 0.0 - 0.2 %    Comment: Performed at [facility], [address] [place], [place] [address]  TSH     Status: None   Collection Time: 12/17/18  4:37 PM  Result Value Ref Range   TSH 1.122 0.350 - 4.500 uIU/mL    Comment: Performed by a 3rd Generation assay with a functional sensitivity of <=0.01 uIU/mL. Performed at [facility], [address] [place], [place] [address]   T4, free      Status: None   Collection Time: 12/17/18  4:37 PM  Result Value Ref Range   Free T4 1.06 0.61 - 1.12 ng/dL    Comment: (NOTE) Biotin ingestion may interfere with free T4 tests. If the results are inconsistent with the TSH level, previous test results, or the clinical presentation, then consider biotin interference. If needed, order repeat testing after stopping biotin. Performed at [facility], [address] [place], [place] [address]   Magnesium     Status: None   Collection Time: 12/17/18  4:37 PM  Result Value Ref Range   Magnesium 1.7 1.7 - 2.4 mg/dL    Comment: Performed at [facility], [address] [place], [place] [address]  Phosphorus     Status: Abnormal   Collection Time: 12/17/18  4:37 PM  Result Value Ref Range   Phosphorus 2.3 (L) 2.5 - 4.6 mg/dL    Comment: Performed at [facility], [address] [place],  [address]  Fibrin derivatives D-Dimer [facility] only)     Status: Abnormal   Collection Time: 12/17/18  4:37 PM  Result Value Ref Range   Fibrin derivatives D-dimer ([facility]) 802.18 (H) 0.00 - 499.00 ng/mL (FEU)  Comment: (NOTE) <> Exclusion of Venous Thromboembolism (VTE) - OUTPATIENT ONLY   (Emergency Department or [place])   0-499 ng/ml (FEU): With a low to intermediate pretest probability                      for VTE this test result excludes the diagnosis                      of VTE.   >499 ng/ml (FEU) : VTE not excluded; additional work up for VTE is                      required. <> Testing on Inpatients and Evaluation of Disseminated Intravascular   Coagulation (DIC) Reference Range:   0-499 ng/ml (FEU) Performed at [facility], [address] [place], [place] [address]   Gamma GT     Status: Abnormal   Collection Time: 12/17/18  7:10 PM  Result Value Ref Range   GGT 318 (H) 7 - 50 U/L    Comment: Performed at [facility], [place] [address] [place], [place] [address]  Ethanol     Status: None    Collection Time: 12/17/18  7:10 PM  Result Value Ref Range   Alcohol, Ethyl (B) <10 <10 mg/dL    Comment: (NOTE) Lowest detectable limit for serum alcohol is 10 mg/dL. For medical purposes only. Performed at [facility], [address] [place], [place] [address]   Drug Screen 10 W/Conf, Serum     Status: None   Collection Time: 12/17/18  7:10 PM  Result Value Ref Range   Amphetamines, IA Negative Cutoff:50 ng/mL   Barbiturates, IA Negative Cutoff:0.1 ug/mL   Benzodiazepines, IA Negative Cutoff:20 ng/mL   Cocaine & Metabolite, IA Negative Cutoff:25 ng/mL   Phencyclidine, IA Negative Cutoff:8 ng/mL   THC(Marijuana) Metabolite, IA Negative Cutoff:5 ng/mL    Comment: (NOTE) Presumptive immunoassay result indicated need for further testing; definitive confirmation was negative.    Opiates, IA Negative Cutoff:5 ng/mL   Oxycodones, IA Negative Cutoff:5 ng/mL   Methadone, IA Negative Cutoff:25 ng/mL   Propoxyphene, IA Negative Cutoff:50 ng/mL    Comment: (NOTE) This test was developed and its performance characteristics determined by [facility].  It has not been cleared or approved by the Food and Drug Administration. Performed At: [facility] [place], [place] [id number] Madelaine Etienne PhrmD [id number]   Vitamin B12     Status: None   Collection Time: 12/17/18  7:10 PM  Result Value Ref Range   Vitamin B-12 469 180 - 914 pg/mL    Comment: (NOTE) This assay is not validated for testing neonatal or myeloproliferative syndrome specimens for Vitamin B12 levels. Performed at [facility], [place] [address] [place], [place] [address]   Reticulocytes     Status: Abnormal   Collection Time: 12/17/18  7:10 PM  Result Value Ref Range   Retic Ct Pct 2.0 0.4 - 3.1 %   RBC. 3.50 (L) 4.22 - 5.81 MIL/uL   Retic Count, Absolute 71.1 19.0 - 186.0 K/uL   Immature Retic Fract 24.2 (H) 2.3 - 15.9 %    Comment: Performed at [facility],  [address] [place], [place] [address]  Iron and TIBC     Status: Abnormal   Collection Time: 12/17/18  7:10 PM  Result Value Ref Range   Iron 38 (L) 45 -  182 ug/dL   TIBC 191 (L) 250 - 450 ug/dL   Saturation Ratios 20 17.9 - 39.5 %   UIBC 153 ug/dL    Comment: Performed at [facility], [address] [place], [place] [address]  THC,MS,WB/Sp Rfx     Status: None   Collection Time: 12/17/18  7:10 PM  Result Value Ref Range   Cannabinoid Confirmation Negative    Tetrahydrocannabinol(THC) Negative ng/mL   Carboxy-THC Negative ng/mL   Hydroxy-THC Negative ng/mL   Cannabinol Negative ng/mL   Cannabidiol Negative ng/mL    Comment: (NOTE) Confirmation threshold: 1.0 ng/mL Performed At: [facility] [place], [place] [id number] Madelaine Etienne PhrmD [id number]   Magnesium     Status: None   Collection Time: 12/18/18  4:19 AM  Result Value Ref Range   Magnesium 2.0 1.7 - 2.4 mg/dL    Comment: Performed at [facility], [address] [place], [place] [address]  Phosphorus     Status: Abnormal   Collection Time: 12/18/18  4:19 AM  Result Value Ref Range   Phosphorus 2.2 (L) 2.5 - 4.6 mg/dL    Comment: Performed at [facility], [address] [place], [place] [address]  Comprehensive metabolic panel     Status: Abnormal   Collection Time: 12/18/18  4:19 AM  Result Value Ref Range   Sodium 139 135 - 145 mmol/L   Potassium 2.1 (LL) 3.5 - 5.1 mmol/L    Comment: CRITICAL RESULT CALLED TO, READ BACK BY AND VERIFIED WITH JANET TUTTLE ON 12/18/18 AT 0551 [facility]    Chloride 99 98 - 111 mmol/L   CO2 30 22 - 32 mmol/L   Glucose, Bld 111 (H) 70 - 99 mg/dL   BUN 7 (L) 8 - 23 mg/dL   Creatinine, Ser 0.63 0.61 - 1.24 mg/dL   Calcium 6.9 (L) 8.9 - 10.3 mg/dL   Total Protein 5.6 (L) 6.5 - 8.1 g/dL   Albumin 2.3 (L) 3.5 - 5.0 g/dL   AST 68 (H) 15 - 41 U/L   ALT 23 0 - 44 U/L   Alkaline Phosphatase 204 (H) 38 - 126 U/L   Total  Bilirubin 1.3 (H) 0.3 - 1.2 mg/dL   GFR calc non Af Amer >60 >60 mL/min   GFR calc Af Amer >60 >60 mL/min   Anion gap 10 5 - 15    Comment: Performed at [facility], [address] [place], [place] [address]  TSH     Status: None   Collection Time: 12/18/18  4:19 AM  Result Value Ref Range   TSH 2.797 0.350 - 4.500 uIU/mL    Comment: Performed by a 3rd Generation assay with a functional sensitivity of <=0.01 uIU/mL. Performed at [facility], [address] [place], [place] [address]   Ferritin     Status: None   Collection Time: 12/18/18  4:19 AM  Result Value Ref Range   Ferritin 227 24 - 336 ng/mL    Comment: Performed at [facility], [address] [place], [place] [address]  Protime-INR     Status: None   Collection Time: 12/18/18  4:19 AM  Result Value Ref Range   Prothrombin Time 13.7 11.4 - 15.2 seconds   INR 1.1 0.8 - 1.2    Comment: (NOTE) INR goal varies based on device and disease states. Performed at [facility], [address] [place],  [address]   Ferritin  Status: None   Collection Time: 12/19/18  4:14 AM  Result Value Ref Range   Ferritin 218 24 - 336 ng/mL    Comment: Performed at [facility], [address] [place], [place] [address]  Basic metabolic panel     Status: Abnormal   Collection Time: 12/19/18  4:14 AM  Result Value Ref Range   Sodium 142 135 - 145 mmol/L   Potassium 2.6 (LL) 3.5 - 5.1 mmol/L    Comment: CRITICAL RESULT CALLED TO, READ BACK BY AND VERIFIED WITH SOUMOUN TOURE 12/19/2018 0754 KBH    Chloride 103 98 - 111 mmol/L   CO2 29 22 - 32 mmol/L   Glucose, Bld 99 70 - 99 mg/dL   BUN <5 (L) 8 - 23 mg/dL   Creatinine, Ser 0.55 (L) 0.61 - 1.24 mg/dL   Calcium 6.6 (L) 8.9 - 10.3 mg/dL   GFR calc non Af Amer >60 >60 mL/min   GFR calc Af Amer >60 >60 mL/min   Anion gap 10 5 - 15    Comment: Performed at [facility], [address] [place], [place] [address]   Magnesium     Status: None   Collection Time: 12/19/18  4:14 AM  Result Value Ref Range   Magnesium 1.8 1.7 - 2.4 mg/dL    Comment: Performed at [facility], [address] [place], [place] [address]  Phosphorus     Status: Abnormal   Collection Time: 12/19/18  4:14 AM  Result Value Ref Range   Phosphorus 2.3 (L) 2.5 - 4.6 mg/dL    Comment: Performed at [facility], [address] [place], [place] [address]  Basic metabolic panel     Status: Abnormal   Collection Time: 12/20/18  4:31 AM  Result Value Ref Range   Sodium 139 135 - 145 mmol/L   Potassium 3.7 3.5 - 5.1 mmol/L   Chloride 103 98 - 111 mmol/L   CO2 28 22 - 32 mmol/L   Glucose, Bld 94 70 - 99 mg/dL   BUN <5 (L) 8 - 23 mg/dL   Creatinine, Ser 0.56 (L) 0.61 - 1.24 mg/dL   Calcium 7.3 (L) 8.9 - 10.3 mg/dL   GFR calc non Af Amer >60 >60 mL/min   GFR calc Af Amer >60 >60 mL/min   Anion gap 8 5 - 15    Comment: Performed at [facility], [address], [place], [place] [address]  Phosphorus     Status: Abnormal   Collection Time: 12/20/18  4:31 AM  Result Value Ref Range   Phosphorus 2.3 (L) 2.5 - 4.6 mg/dL    Comment: Performed at [facility], [address] [place], [place] [address]  Magnesium     Status: None   Collection Time: 12/20/18  4:31 AM  Result Value Ref Range   Magnesium 2.3 1.7 - 2.4 mg/dL    Comment: Performed at [facility], [address] [place], [place] [address]     PHQ2/9: Depression screen [facility] 2/9 12/27/2018 12/16/2018 09/14/2018 06/10/2018 04/14/2018  Decreased Interest 0 0 0 0 0  Down, Depressed, Hopeless 0 0 0 0 0  PHQ - 2 Score 0 0 0 0 0  Altered sleeping 0 1 0 0 -  Tired, decreased energy 0 1 0 0 -  Change in appetite 0 2 0 0 -  Feeling bad or failure about yourself  0 0 0 0 -  Trouble concentrating 0 0 0 0 -  Moving slowly or fidgety/restless 0 - 0 1 -  Suicidal thoughts 0 0 0 0 -  PHQ-9 Score 0 4 0 1 -  Difficult doing  work/chores Not difficult at all Somewhat difficult - Not difficult at all -    phq 9 is negative   Fall Risk: Fall Risk  12/27/2018 09/14/2018 06/10/2018 04/14/2018 03/14/2018  Falls in the past year? 0 0 0 1 1  Number falls in past yr: 0 0 0 1 0  Injury with Fall? 0 0 0 0 0  Risk for fall due to : - - - History of fall(s) -  Follow up - - - Falls prevention discussed -     Functional Status Survey: Is the patient deaf or have difficulty hearing?: No Does the patient have difficulty seeing, even when wearing glasses/contacts?: Yes Does the patient have difficulty concentrating, remembering, or making decisions?: No Does the patient have difficulty walking or climbing stairs?: No Does the patient have difficulty dressing or bathing?: No Does the patient have difficulty doing errands alone such as visiting a doctor's office or shopping?: No   Assessment & Plan  1. Pancytopenia ([place])  - CBC with Differential/Platelet - he refused to have any labs done today, he states he will return when he is ready for it   2. Hypokalemia  - BMP w/ GFR - [facility]  3. Hypomagnesemia  - BMP w/ GFR - [facility]  4. Hospital discharge follow-up  Reviewed medications with patient, he was taking full dose of spironolactone instead of half, he was taking metoprolol 100 mg given by Dr. Humphrey Rolls and the 25 mg of metoprolol given by hospitalist, he was also taking 40 meq of potassium instead of 20 meq. Discussed chronic care team to help with medication management but he refused , he said his sister will help him out.   5. Alcoholism ([place])  He states he was drinking heavily again prior to hospital stay but he stopped since discharged.

## 2019-01-02 DIAGNOSIS — Z79899 Other long term (current) drug therapy: Secondary | ICD-10-CM | POA: Diagnosis not present

## 2019-01-02 DIAGNOSIS — M069 Rheumatoid arthritis, unspecified: Secondary | ICD-10-CM | POA: Diagnosis not present

## 2019-01-03 DIAGNOSIS — I10 Essential (primary) hypertension: Secondary | ICD-10-CM | POA: Diagnosis not present

## 2019-01-03 DIAGNOSIS — R0602 Shortness of breath: Secondary | ICD-10-CM | POA: Diagnosis not present

## 2019-01-03 DIAGNOSIS — I2510 Atherosclerotic heart disease of native coronary artery without angina pectoris: Secondary | ICD-10-CM | POA: Diagnosis not present

## 2019-01-03 DIAGNOSIS — I509 Heart failure, unspecified: Secondary | ICD-10-CM | POA: Diagnosis not present

## 2019-01-03 DIAGNOSIS — I4891 Unspecified atrial fibrillation: Secondary | ICD-10-CM | POA: Diagnosis not present

## 2019-01-04 ENCOUNTER — Ambulatory Visit: Payer: Self-pay | Admitting: Pharmacist

## 2019-01-04 NOTE — Chronic Care Management (AMB) (Signed)
  Chronic Care Management   Note  12/14/2018 Name: Jason Fitzgerald MRN: [id number] DOB: 19-Nov-1950  68 y.o. year old male referred to Chronic Care Management by his health plan.   Was unable to reach patient via telephone today and have left HIPAA compliant voicemail asking patient to return my call. (unsuccessful outreach #1).  Follow up plan: A HIPPA compliant phone message was left for the patient providing contact information and requesting a return call.  The care management team will reach out to the patient again over the next 10 days.   Ruben Reason, PharmD Clinical Pharmacist [facility] [phone]

## 2019-01-04 NOTE — Chronic Care Management (AMB) (Signed)
  Chronic Care Management   Note  01/04/2019 Name: Jason Fitzgerald MRN: [id number] DOB: 1950-07-08  Jason Fitzgerald is a 68 y.o. year old male who sees Steele Sizer, MD for primary care. Patient referred to [facility] clinical pharmacy services by his health plan. Telephone outreach to patient today to introduce CCM services.   Plan:  Jason Fitzgerald was given information about Chronic Care Management services today including:  1. CCM service includes personalized support from designated clinical staff supervised by her physician, including individualized plan of care and coordination with other care providers 2. 24/7 contact phone numbers for assistance for urgent and routine care needs. 3. Service will only be billed when office clinical staff spend 20 minutes or more in a month to coordinate care. 4. Only one practitioner may furnish and bill the service in a calendar month. 5. The patient may stop CCM services at any time (effective at the end of the month) by phone call to the office staff. 6. The patient will be responsible for cost sharing (co-pay) of up to 20% of the service fee (after annual deductible is met).  Patient did not agree to enrollment in care management services and does not wish to consider at this time.   Follow up plan: The patient has been provided with contact information for the care management team and has been advised to call with any health related questions or concerns.     Ruben Reason, PharmD Clinical Pharmacist [facility] [phone]

## 2019-01-09 ENCOUNTER — Other Ambulatory Visit: Payer: Self-pay | Admitting: Family Medicine

## 2019-01-09 DIAGNOSIS — E876 Hypokalemia: Secondary | ICD-10-CM | POA: Diagnosis not present

## 2019-01-09 DIAGNOSIS — D61818 Other pancytopenia: Secondary | ICD-10-CM | POA: Diagnosis not present

## 2019-01-09 LAB — BASIC METABOLIC PANEL WITH GFR
BUN: 9 mg/dL (ref 7–25)
CO2: 29 mmol/L (ref 20–32)
Calcium: 8.8 mg/dL (ref 8.6–10.3)
Chloride: 106 mmol/L (ref 98–110)
Creat: 0.8 mg/dL (ref 0.70–1.25)
GFR, Est African American: 108 mL/min/{1.73_m2} (ref >=60)
GFR, Est Non African American: 93 mL/min/{1.73_m2} (ref >=60)
Glucose, Bld: 90 mg/dL (ref 65–99)
Potassium: 3.6 mmol/L (ref 3.5–5.3)
Sodium: 143 mmol/L (ref 135–146)

## 2019-01-09 LAB — CBC WITH DIFFERENTIAL/PLATELET
Absolute Monocytes: 524 cells/uL (ref 200–950)
Basophils Absolute: 40 cells/uL (ref 0–200)
Basophils Relative: 0.9 %
Eosinophils Absolute: 132 cells/uL (ref 15–500)
Eosinophils Relative: 3 %
HCT: 32 % — ABNORMAL LOW (ref 38.5–50.0)
Hemoglobin: 10.6 g/dL — ABNORMAL LOW (ref 13.2–17.1)
Lymphs Abs: 1628 cells/uL (ref 850–3900)
MCH: 30 pg (ref 27.0–33.0)
MCHC: 33.1 g/dL (ref 32.0–36.0)
MCV: 90.7 fL (ref 80.0–100.0)
MPV: 10.7 fL (ref 7.5–12.5)
Monocytes Relative: 11.9 %
Neutro Abs: 2077 cells/uL (ref 1500–7800)
Neutrophils Relative %: 47.2 %
Platelets: 291 10*3/uL (ref 140–400)
RBC: 3.53 10*6/uL — ABNORMAL LOW (ref 4.20–5.80)
RDW: 13.8 % (ref 11.0–15.0)
Total Lymphocyte: 37 %
WBC: 4.4 10*3/uL (ref 3.8–10.8)

## 2019-01-11 ENCOUNTER — Other Ambulatory Visit: Payer: Self-pay | Admitting: Family Medicine

## 2019-01-11 DIAGNOSIS — E519 Thiamine deficiency, unspecified: Secondary | ICD-10-CM

## 2019-01-11 NOTE — Telephone Encounter (Signed)
Requested medication (s) are due for refill today: yes  Requested medication (s) are on the active medication list: yes  Last refill:  12/19/2018  Future visit scheduled: yes  Notes to clinic:  Review for refill    Requested Prescriptions  Pending Prescriptions Disp Refills   Thiamine HCl (VITAMIN B1) 100 MG TABS [Pharmacy Med Name: VITAMIN B-1 100 MG TABLET] 30 tablet 1    Sig: TAKE 1 TABLET BY MOUTH EVERY DAY     Off-Protocol Failed - 01/11/2019  9:31 AM      Failed - Medication not assigned to a protocol, review manually.      Passed - Valid encounter within last 12 months    Recent Outpatient Visits          2 weeks ago Pancytopenia [facility])   [facility] Steele Sizer, MD   3 weeks ago History of alcoholism [facility])   [facility] Steele Sizer, MD   3 months ago Atrial fibrillation, unspecified type [facility])   [facility] Steele Sizer, MD   7 months ago Atrial fibrillation, unspecified type [facility])   [facility] Steele Sizer, MD   10 months ago History of alcoholism [facility])   [facility] Steele Sizer, MD      Future Appointments            In 2 months Ancil Boozer, Drue Stager, MD [facility], [facility]

## 2019-01-12 DIAGNOSIS — R0600 Dyspnea, unspecified: Secondary | ICD-10-CM | POA: Diagnosis not present

## 2019-01-12 DIAGNOSIS — J432 Centrilobular emphysema: Secondary | ICD-10-CM | POA: Diagnosis not present

## 2019-01-12 DIAGNOSIS — J84112 Idiopathic pulmonary fibrosis: Secondary | ICD-10-CM | POA: Diagnosis not present

## 2019-01-20 ENCOUNTER — Other Ambulatory Visit: Payer: Self-pay

## 2019-01-20 NOTE — Patient Outreach (Signed)
[facility]) Care Management  01/20/2019  Jason Fitzgerald 06/26/1950 [id number]   Medication Adherence call to Jason Fitzgerald [facility] Verify spoke with patient he is past due on Valsartan 80 mg,patient explain he take s1 tablet daily,patient explain he has pick up this medication from [facility] and has enough for 3 month. Jason Fitzgerald is showing past due under [facility].   [facility] Management Direct Dial [phone]  Fax [phone] [email or web address]

## 2019-02-08 ENCOUNTER — Other Ambulatory Visit: Payer: Self-pay | Admitting: Family Medicine

## 2019-02-08 DIAGNOSIS — H2513 Age-related nuclear cataract, bilateral: Secondary | ICD-10-CM | POA: Diagnosis not present

## 2019-02-08 DIAGNOSIS — E519 Thiamine deficiency, unspecified: Secondary | ICD-10-CM

## 2019-02-08 LAB — HM DIABETES EYE EXAM

## 2019-02-08 NOTE — Telephone Encounter (Signed)
Requested medication (s) are due for refill today: yes  Requested medication (s) are on the active medication list: yes  Last refill:  01/12/2019  Future visit scheduled: yes  Notes to clinic:  Medication not assigned to a protocol, review manually   Requested Prescriptions  Pending Prescriptions Disp Refills   thiamine 100 MG tablet [Pharmacy Med Name: VITAMIN B-1 100 MG TABLET] 30 tablet 1    Sig: TAKE 1 TABLET BY MOUTH EVERY DAY      Off-Protocol Failed - 02/08/2019  8:31 AM      Failed - Medication not assigned to a protocol, review manually.      Passed - Valid encounter within last 12 months    Recent Outpatient Visits           1 month ago Pancytopenia [facility])   [facility] Steele Sizer, MD   1 month ago History of alcoholism [facility])   [facility] Steele Sizer, MD   4 months ago Atrial fibrillation, unspecified type [facility])   [facility] Steele Sizer, MD   8 months ago Atrial fibrillation, unspecified type [facility])   [facility] Steele Sizer, MD   11 months ago History of alcoholism [facility])   [facility] Steele Sizer, MD       Future Appointments             In 1 month Ancil Boozer, Drue Stager, MD [facility], [facility]

## 2019-02-10 ENCOUNTER — Other Ambulatory Visit: Payer: Self-pay | Admitting: Family Medicine

## 2019-02-10 DIAGNOSIS — E519 Thiamine deficiency, unspecified: Secondary | ICD-10-CM

## 2019-02-13 ENCOUNTER — Encounter: Payer: Self-pay | Admitting: Family Medicine

## 2019-03-02 ENCOUNTER — Other Ambulatory Visit: Payer: Self-pay | Admitting: Family Medicine

## 2019-03-02 DIAGNOSIS — I2510 Atherosclerotic heart disease of native coronary artery without angina pectoris: Secondary | ICD-10-CM

## 2019-03-06 DIAGNOSIS — R0602 Shortness of breath: Secondary | ICD-10-CM | POA: Diagnosis not present

## 2019-03-06 DIAGNOSIS — I4891 Unspecified atrial fibrillation: Secondary | ICD-10-CM | POA: Diagnosis not present

## 2019-03-06 DIAGNOSIS — I509 Heart failure, unspecified: Secondary | ICD-10-CM | POA: Diagnosis not present

## 2019-03-06 DIAGNOSIS — I10 Essential (primary) hypertension: Secondary | ICD-10-CM | POA: Diagnosis not present

## 2019-03-14 ENCOUNTER — Other Ambulatory Visit: Payer: Self-pay | Admitting: Family Medicine

## 2019-03-14 DIAGNOSIS — I4891 Unspecified atrial fibrillation: Secondary | ICD-10-CM

## 2019-03-14 DIAGNOSIS — I509 Heart failure, unspecified: Secondary | ICD-10-CM

## 2019-03-15 DIAGNOSIS — Z79899 Other long term (current) drug therapy: Secondary | ICD-10-CM | POA: Diagnosis not present

## 2019-03-15 DIAGNOSIS — M0579 Rheumatoid arthritis with rheumatoid factor of multiple sites without organ or systems involvement: Secondary | ICD-10-CM | POA: Diagnosis not present

## 2019-03-16 DIAGNOSIS — I509 Heart failure, unspecified: Secondary | ICD-10-CM | POA: Diagnosis not present

## 2019-03-16 DIAGNOSIS — I2510 Atherosclerotic heart disease of native coronary artery without angina pectoris: Secondary | ICD-10-CM | POA: Diagnosis not present

## 2019-03-16 DIAGNOSIS — I351 Nonrheumatic aortic (valve) insufficiency: Secondary | ICD-10-CM | POA: Diagnosis not present

## 2019-03-16 DIAGNOSIS — I4891 Unspecified atrial fibrillation: Secondary | ICD-10-CM | POA: Diagnosis not present

## 2019-03-16 DIAGNOSIS — I10 Essential (primary) hypertension: Secondary | ICD-10-CM | POA: Diagnosis not present

## 2019-03-20 ENCOUNTER — Other Ambulatory Visit: Payer: Self-pay

## 2019-03-20 ENCOUNTER — Encounter: Payer: Self-pay | Admitting: Family Medicine

## 2019-03-20 ENCOUNTER — Ambulatory Visit (INDEPENDENT_AMBULATORY_CARE_PROVIDER_SITE_OTHER): Payer: Medicare Other | Admitting: Family Medicine

## 2019-03-20 VITALS — BP 100/62 | HR 84 | Temp 96.8°F | Resp 16 | Ht 69.0 in | Wt 210.3 lb

## 2019-03-20 DIAGNOSIS — M0579 Rheumatoid arthritis with rheumatoid factor of multiple sites without organ or systems involvement: Secondary | ICD-10-CM | POA: Diagnosis not present

## 2019-03-20 DIAGNOSIS — J8410 Pulmonary fibrosis, unspecified: Secondary | ICD-10-CM | POA: Diagnosis not present

## 2019-03-20 DIAGNOSIS — Z23 Encounter for immunization: Secondary | ICD-10-CM

## 2019-03-20 DIAGNOSIS — I5032 Chronic diastolic (congestive) heart failure: Secondary | ICD-10-CM | POA: Diagnosis not present

## 2019-03-20 DIAGNOSIS — I4891 Unspecified atrial fibrillation: Secondary | ICD-10-CM

## 2019-03-20 DIAGNOSIS — E519 Thiamine deficiency, unspecified: Secondary | ICD-10-CM

## 2019-03-20 DIAGNOSIS — E785 Hyperlipidemia, unspecified: Secondary | ICD-10-CM

## 2019-03-20 DIAGNOSIS — I509 Heart failure, unspecified: Secondary | ICD-10-CM

## 2019-03-20 DIAGNOSIS — J84112 Idiopathic pulmonary fibrosis: Secondary | ICD-10-CM | POA: Diagnosis not present

## 2019-03-20 DIAGNOSIS — G629 Polyneuropathy, unspecified: Secondary | ICD-10-CM

## 2019-03-20 DIAGNOSIS — R202 Paresthesia of skin: Secondary | ICD-10-CM

## 2019-03-20 DIAGNOSIS — I2510 Atherosclerotic heart disease of native coronary artery without angina pectoris: Secondary | ICD-10-CM

## 2019-03-20 DIAGNOSIS — F1021 Alcohol dependence, in remission: Secondary | ICD-10-CM

## 2019-03-20 DIAGNOSIS — M05742 Rheumatoid arthritis with rheumatoid factor of left hand without organ or systems involvement: Secondary | ICD-10-CM

## 2019-03-20 DIAGNOSIS — M05741 Rheumatoid arthritis with rheumatoid factor of right hand without organ or systems involvement: Secondary | ICD-10-CM | POA: Diagnosis not present

## 2019-03-20 MED ORDER — ATORVASTATIN CALCIUM 10 MG PO TABS: 10.0000 mg | ORAL_TABLET | Freq: Every day | ORAL | 0 refills | Status: DC

## 2019-03-20 NOTE — Progress Notes (Signed)
Name: Jason Fitzgerald   MRN: [id number]    DOB: Dec 04, 1950   Date:03/20/2019       Progress Note  Subjective  Chief Complaint  Chief Complaint  Patient presents with  . Medication Refill    3 month F/U  . Hypertension  . Coronary Artery Disease  . History of alcoholism  . Peripheral Neuropathy    HPI  History of alcoholism: he quit early 2020, had a relapse but said not drinking since end of 2020. He is drinking water and ginger ale. Appetite is better and he has gained weight   HTN/CHF and Afib: rate controlled with beta-blocker, amiodarone, also taking aldactone,off lasix andpotassium. He has SOB with a activities but denies, palpitationor orthopnea, hestates dizziness has resolved once he started getting up slowly. He did not bring his medications with him today . He is avoiding salt, eating more fish   RA: under the care of Dr. Jefm Bryant. He is off plaquenil ( he states stopped by cardiologist) still taking  prednisone and Arava ( but prednisone is not on his last note from Rheumatologist, advised to bring all medications on his next visit) . He states pain is under control at this timehe states today is 0/10, he states had a flare last week and right wrist was swollen and tender   CAD: he states he has been told he had a heart attack, not on statin, normal LDL,he is on statin therapy , reviewed last labs  Pulmonary fiboris: never smoked, states his mother did and also co-workers, under the care of Dr. Raul Del, has daily cough but nowheezing , he has SOB with activity , CT chest done 2019 showed bilateral interstitial lung opacities with pulmonary fibrosis.He states not using inhalers, I did not see that it was discharged He still spits up a lot   IMPRESSION:  1. Mild-to-moderate interval progression of basilar predominant  fibrotic interstitial lung disease with extensive honeycombing,  compatible with usual interstitial pneumonia (UIP). Findings are  consistent  with UIP per consensus guidelines: Diagnosis of  Idiopathic Pulmonary Fibrosis: An Official ATS/ERS/JRS/ALAT Clinical  Practice Guideline. [place], Iss 5,  [phone], Oct 03 2016.  2. One vessel coronary atherosclerosis.  3. Prominent symmetric bilateral gynecomastia, worsened.   Neuropathy: on both feet, he states gabapentin has helped left leg more than right ,he has toe tingling. He has seen Dr. Manuella Ghazi in the past. He states feeling better since he quit drinking   Gynecomastia: discussed imaging, going on for months, it may be from medications, history of alcoholism. He does not want further testing. Unchanged   Anemia: he has not seen hematologist, platelets has normalized anemia has been stable. He states eating better now and we will recheck next visit, colonoscopy is up to date   Patient Active Problem List   Diagnosis Date Noted  . Diarrhea   . Dizziness 12/17/2018  . Hypotension 12/17/2018  . Hypophosphatemia 12/17/2018  . Hypomagnesemia 12/17/2018  . Positive D-dimer 12/17/2018  . Abnormal LFTs (liver function tests) 12/17/2018  . Anemia 12/17/2018  . Pulmonary fibrosis ([place])   . GERD (gastroesophageal reflux disease)   . COPD (chronic obstructive pulmonary disease) ([place])   . CHF (congestive heart failure) ([place])   . Encounter for screening colonoscopy   . Polyp of transverse colon   . Internal hemorrhoids   . Hypokalemia 02/03/2018  . History of alcoholism ([place]) 12/08/2017  . Rheumatoid arthritis involving multiple sites with positive rheumatoid factor (  [place]) 10/13/2017  . Chronic systolic CHF (congestive heart failure) ([place]) 03/12/2017  . AF (paroxysmal atrial fibrillation) ([place]) 03/12/2017  . Arthritis of left knee 08/01/2016  . 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 Chronic cough 05/29/2015  . CAFL (chronic airflow limitation) ([place]) 10/24/2014  . Arteriosclerosis of coronary artery 10/24/2014  . History of fundoplication  96/29/5284  . HTN (hypertension) 10/24/2014  . LAD (lymphadenopathy), mediastinal 07/13/2013  . Interstitial lung disease ([place]) 06/19/2013  . 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 COLONOSCOPY WITH PROPOFOL N/A 08/19/2018   Procedure: COLONOSCOPY WITH PROPOFOL;  Surgeon: Virgel Manifold, MD;  Location: [facility] ENDOSCOPY;  Service: Endoscopy;  Laterality: N/A;  . HERNIA REPAIR Right 1973   open  . INGUINAL HERNIA REPAIR Right 11/08/2014   Procedure: LAPAROSCOPIC RIGHT INGUINAL HERNIA REPAIR;  Surgeon: Hubbard Robinson, MD;  Location: [facility] ORS;  Service: General;  Laterality: Right;  . knee arthroscopy Right 1972  . RIGHT/LEFT HEART CATH AND CORONARY ANGIOGRAPHY Right 03/05/2017   Procedure: RIGHT/LEFT HEART CATH AND CORONARY ANGIOGRAPHY;  Surgeon: Dionisio David, MD;  Location: [facility];  Service: Cardiovascular;  Laterality: Right;    Family History  Problem Relation Age of Onset  . Diabetes Mother   . Cancer Father   . AAA (abdominal aortic aneurysm) Brother     Social History   Tobacco Use  . Smoking status: Never Smoker  . Smokeless tobacco: Never Used  Substance Use Topics  . Alcohol use: Not Currently    Alcohol/week: 0.0 standard drinks    Comment: 2 quarts a week, he quit again 12/2018  . Drug use: Not Currently    Types: Cocaine    Comment: as an young adult      Current Outpatient Medications:  .  albuterol (PROVENTIL HFA) 108 (90 Base) MCG/ACT inhaler, Inhale 2 puffs into the lungs as needed., Disp: 1 Inhaler, Rfl: 2 .  amiodarone (PACERONE) 200 MG tablet, Take 1 tablet by mouth daily. , Disp: , Rfl:  .  aspirin 325 MG EC tablet, TAKE 1 TABLET BY MOUTH EVERY DAY, Disp: 90 tablet, Rfl: 1 .  atorvastatin (LIPITOR) 10 MG tablet, TAKE 1 TABLET BY MOUTH EVERY DAY, Disp: 90 tablet, Rfl: 0 .  budesonide-formoterol (SYMBICORT) 160-4.5 MCG/ACT inhaler, Inhale 2 puffs into the lungs as  needed., Disp: , Rfl:  .  hydroxychloroquine (PLAQUENIL) 200 MG tablet, Take by mouth., Disp: , Rfl:  .  leflunomide (ARAVA) 10 MG tablet, Take 10 mg by mouth daily. , Disp: , Rfl:  .  Magnesium Oxide 400 MG CAPS, Take 1 capsule (400 mg total) by mouth every morning., Disp: 30 capsule, Rfl: 0 .  metoprolol succinate (TOPROL-XL) 100 MG 24 hr tablet, Take 100 mg by mouth daily. Take with or immediately following a meal., Disp: , Rfl:  .  potassium chloride SA (KLOR-CON) 20 MEQ tablet, Take 1 tablet (20 mEq total) by mouth daily., Disp: 30 tablet, Rfl: 0 .  predniSONE (DELTASONE) 5 MG tablet, Take 10 mg by mouth daily. , Disp: , Rfl:  .  spironolactone (ALDACTONE) 25 MG tablet, Take 0.5 tablets (12.5 mg total) by mouth daily. (Patient taking differently: Take 25 mg by mouth daily. ), Disp: 15 tablet, Rfl: 0 .  thiamine 100 MG tablet, TAKE 1 TABLET BY MOUTH EVERY DAY, Disp: 90 tablet, Rfl: 1 .  valsartan (DIOVAN) 40 MG tablet, Take 2 tablets (80 mg total) by  mouth daily., Disp: 30 tablet, Rfl: 0  No Known Allergies  I personally reviewed active problem list, medication list, allergies, family history, social history with the patient/caregiver today.   ROS   Constitutional: Negative for fever , positive for  weight change.  Respiratory: Positive for cough, positive for  shortness of breath with activity .   Cardiovascular: Negative for chest pain or palpitations.  Gastrointestinal: Negative for abdominal pain, no bowel changes.  Musculoskeletal: Negative for gait problem or joint swelling.  Skin: Negative for rash.  Neurological: Negative for dizziness or headache.  No other specific complaints in a complete review of systems (except as listed in HPI above).  Objective  Vitals:   03/20/19 0946 03/20/19 1029  BP:  100/62  Pulse: 84   Resp: 16   Temp: (!) 96.8 F (36 C)   TempSrc: Temporal   SpO2: 96%   Weight: 210 lb 4.8 oz (95.4 kg)   Height: 5\' 9"  (1.753 m)     Body mass index  is 31.06 kg/m.  Physical Exam  Constitutional: Patient appears well-developed and well-nourished. Obese No distress.  HEENT: head atraumatic, normocephalic, pupils equal and reactive to light Cardiovascular: Normal rate, regular rhythm and normal heart sounds.  No murmur heard. No BLE edema. Pulmonary/Chest: Effort normal and breath sounds normal. No respiratory distress. Abdominal: Soft.  There is no tenderness. Psychiatric: Patient has a normal mood and affect. behavior is normal. Judgment and thought content normal.  Recent Results (from the past 2160 hour(s))  BASIC METABOLIC PANEL WITH GFR     Status: None   Collection Time: 01/09/19 12:00 AM  Result Value Ref Range   Glucose, Bld 90 65 - 99 mg/dL    Comment: .            Fasting reference interval .    BUN 9 7 - 25 mg/dL   Creat 0.80 0.70 - 1.25 mg/dL    Comment: For patients >57 years of age, the reference limit for Creatinine is approximately 13% higher for people identified as African-American. .    GFR, Est Non African American 93 > OR = 60 mL/min/1.69m2   GFR, Est African American 108 > OR = 60 mL/min/1.30m2   BUN/Creatinine Ratio NOT APPLICABLE 6 - 22 (calc)   Sodium 143 135 - 146 mmol/L   Potassium 3.6 3.5 - 5.3 mmol/L   Chloride 106 98 - 110 mmol/L   CO2 29 20 - 32 mmol/L   Calcium 8.8 8.6 - 10.3 mg/dL  CBC with Differential/Platelet     Status: Abnormal   Collection Time: 01/09/19 12:00 AM  Result Value Ref Range   WBC 4.4 3.8 - 10.8 Thousand/uL   RBC 3.53 (L) 4.20 - 5.80 Million/uL   Hemoglobin 10.6 (L) 13.2 - 17.1 g/dL   HCT 32.0 (L) 38.5 - 50.0 %   MCV 90.7 80.0 - 100.0 fL   MCH 30.0 27.0 - 33.0 pg   MCHC 33.1 32.0 - 36.0 g/dL   RDW 13.8 11.0 - 15.0 %   Platelets 291 140 - 400 Thousand/uL   MPV 10.7 7.5 - 12.5 fL   Neutro Abs 2,077 1,500 - 7,800 cells/uL   Lymphs Abs 1,628 850 - 3,900 cells/uL   Absolute Monocytes 524 200 - 950 cells/uL   Eosinophils Absolute 132 15 - 500 cells/uL   Basophils  Absolute 40 0 - 200 cells/uL   Neutrophils Relative % 47.2 %   Total Lymphocyte 37.0 %   Monocytes Relative 11.9 %  Eosinophils Relative 3.0 %   Basophils Relative 0.9 %  HM DIABETES EYE EXAM     Status: None   Collection Time: 02/08/19 12:00 AM  Result Value Ref Range   HM Diabetic Eye Exam No Retinopathy No Retinopathy    Comment: Continued use of hydroxychloroquine-5 mg/day/kg safe OCT-nml     PHQ2/9: Depression screen [facility] 2/9 03/20/2019 12/27/2018 12/16/2018 09/14/2018 06/10/2018  Decreased Interest 0 0 0 0 0  Down, Depressed, Hopeless 0 0 0 0 0  PHQ - 2 Score 0 0 0 0 0  Altered sleeping 0 0 1 0 0  Tired, decreased energy 0 0 1 0 0  Change in appetite 0 0 2 0 0  Feeling bad or failure about yourself  0 0 0 0 0  Trouble concentrating 0 0 0 0 0  Moving slowly or fidgety/restless 0 0 - 0 1  Suicidal thoughts 0 0 0 0 0  PHQ-9 Score 0 0 4 0 1  Difficult doing work/chores Not difficult at all Not difficult at all Somewhat difficult - Not difficult at all  Some recent data might be hidden    phq 9 is negative   Fall Risk: Fall Risk  03/20/2019 12/27/2018 09/14/2018 06/10/2018 04/14/2018  Falls in the past year? 0 0 0 0 1  Number falls in past yr: 0 0 0 0 1  Injury with Fall? 0 0 0 0 0  Risk for fall due to : - - - - History of fall(s)  Follow up - - - - Falls prevention discussed     Functional Status Survey: Is the patient deaf or have difficulty hearing?: No Does the patient have difficulty seeing, even when wearing glasses/contacts?: No Does the patient have difficulty concentrating, remembering, or making decisions?: No Does the patient have difficulty walking or climbing stairs?: No Does the patient have difficulty dressing or bathing?: No Does the patient have difficulty doing errands alone such as visiting a doctor's office or shopping?: No    Assessment & Plan  1. Atrial fibrillation, unspecified type ([place])  Rate controlled  2. Pulmonary fibrosis ([place])  Keep  follow up with Dr. Raul Del   3. 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 Need for 23-polyvalent pneumococcal polysaccharide vaccine  - Pneumococcal polysaccharide vaccine 23-valent greater than or equal to 2yo subcutaneous/IM  5. Neuropathy  Doing better  6. Chronic congestive heart failure, unspecified heart failure type (HCC)  Stable, states recently seen by Dr. Humphrey Rolls   7. Dyslipidemia  - atorvastatin (LIPITOR) 10 MG tablet; Take 1 tablet (10 mg total) by mouth daily.  Dispense: 90 tablet; Refill: 0  8. Paresthesia of both feet   9. History of alcoholism ([place])  Quit again 12/2018  10. Vitamin B1 deficiency  Taking B1 a few times a week   11. Coronary artery disease involving native coronary artery of native heart without angina pectoris  - atorvastatin (LIPITOR) 10 MG tablet; Take 1 tablet (10 mg total) by mouth daily.  Dispense: 90 tablet; Refill: 0

## 2019-04-11 NOTE — Progress Notes (Signed)
Patient ID: Jason Fitzgerald, male    DOB: January 25, 1951, 69 y.o.   MRN: [id number]  PCP: Steele Sizer, MD  Chief Complaint  Patient presents with  . Headache    right side, feels like water in his head  . Ear Pain    right side  . Dizziness  . Feet Numb    both feet, for a very long time    Subjective:   Jason Fitzgerald is a 69 y.o. male, presents to clinic with CC of the following:  Chief Complaint  Patient presents with  . Headache    right side, feels like water in his head  . Ear Pain    right side  . Dizziness  . Feet Numb    both feet, for a very long time    HPI:  Patient is a 69 y.o. male who presents today with complaints above.  He is here with his daughter.  He he notes for the past 3 weeks, he has been battling a headache, more on the right side and some mild ear discomfort, feels pain more above the ear and adjacent in the TMJ region than deep within the ear.  He has had no fevers, no discharge from the ear.  He notes it is difficult at times to open and close his jaw due to pain in this area on the right.  Feels like his arthritis pain by his description.  No hearing loss, no ringing in the ears.  No room spinning dizziness.  At times when he looks up, he states he feels a little lightheaded.  He has a history of cataracts, and wondered if it was related to this.  At times, when he awakens in the morning, his right eye is harder to open, with a little bit of crustiness, at times may be a little swelling present.  No redness of the eye.  no passing out episodes or feeling like he is going to pass out.  The symptoms are more intermittent than all the time. He has bilateral numbness and both feet, and has had this for years, has not significantly worsened in the recent past. He denies any one-sided symptoms with no right arm or leg or left arm or leg weakness or numbness and tingling, no facial droop, no difficulty with speech, no vision loss or acute reduced  vision in 1 eye.  He brought his medicines with him today to review, and he noted he is not taking the prednisone.  That was prescribed by Dr. Jefm Bryant for arthritis. He denies any recent chest pains, palpitations, shortness of breath, change in bowel habits, abdominal pains.  He does have some intermittent knee pains related to his arthritis.   He just recently saw Dr. Ancil Boozer 03/20/19 with the following issues addressed: History of alcoholism: he quit early 2020, had a relapse but said not drinking since end of 2020. He is drinking water and ginger ale. Appetite is better and he has gained weight   HTN/CHF and Afib: rate controlled with beta-blocker, amiodarone, also taking aldactone,off lasix andpotassium. He has SOB with a activities but denies, palpitationor orthopnea, hestates dizziness has resolved once he started getting up slowly. He did not bring his medications with him today . He is avoiding salt, eating more fish   RA: under the care of Dr. Jefm Bryant. Heis off plaquenil ( he states stopped by cardiologist) still [place] ( but prednisone is not on his last note  from Rheumatologist, advised to bring all medications on his next visit) . He states pain is under control at this timehe states today is0/10, he states had a flare last week and right wrist was swollen and tender   CAD: he states he has been told he had a heart attack, not on statin, normal LDL,he is on statin therapy , reviewed last labs  Pulmonary fiboris: never smoked, states his mother did and also co-workers, under the care of Dr. Raul Del, has daily cough but nowheezing , he has SOB with activity , CT chest done 2019 showed bilateral interstitial lung opacities with pulmonary fibrosis.He states not using inhalers, I did not see that it was discharged He still spits up a lot   IMPRESSION:  1. Mild-to-moderate interval progression of basilar predominant  fibrotic interstitial lung disease  with extensive honeycombing,  compatible with usual interstitial pneumonia (UIP). Findings are  consistent with UIP per consensus guidelines: Diagnosis of  Idiopathic Pulmonary Fibrosis: An Official ATS/ERS/JRS/ALAT Clinical  Practice Guideline. [place], Iss 5,  [phone], Oct 03 2016.  2. One vessel coronary atherosclerosis.  3. Prominent symmetric bilateral gynecomastia, worsened.   Neuropathy: on both feet, he states gabapentin has helped left leg more than right ,he has toe tingling. He has seen Dr. Manuella Ghazi in the past. He states feeling better since he quit drinking   Gynecomastia: discussed imaging, going on for months, it may be from medications, history of alcoholism. [place] not want further testing. Unchanged   Anemia: he has not seen hematologist, platelets has normalized anemia has been stable. He states eating better now and we will recheck next visit, colonoscopy is up to date     Patient Active Problem List   Diagnosis Date Noted  . Diarrhea   . Dizziness 12/17/2018  . Hypotension 12/17/2018  . Hypophosphatemia 12/17/2018  . Hypomagnesemia 12/17/2018  . Positive D-dimer 12/17/2018  . Abnormal LFTs (liver function tests) 12/17/2018  . Anemia 12/17/2018  . Pulmonary fibrosis ([place])   . GERD (gastroesophageal reflux disease)   . COPD (chronic obstructive pulmonary disease) ([place])   . CHF (congestive heart failure) ([place])   . Encounter for screening colonoscopy   . Polyp of transverse colon   . Internal hemorrhoids   . Hypokalemia 02/03/2018  . History of alcoholism ([place]) 12/08/2017  . Rheumatoid arthritis involving multiple sites with positive rheumatoid factor ([place]) 10/13/2017  . Chronic systolic CHF (congestive heart failure) ([place]) 03/12/2017  . AF (paroxysmal atrial fibrillation) ([place]) 03/12/2017  . Arthritis of left knee 08/01/2016  . 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 Chronic cough 05/29/2015  . CAFL (chronic airflow  limitation) ([place]) 10/24/2014  . Arteriosclerosis of coronary artery 10/24/2014  . History of fundoplication 66/44/0347  . HTN (hypertension) 10/24/2014  . LAD (lymphadenopathy), mediastinal 07/13/2013  . Interstitial lung disease ([place]) 06/19/2013  . 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  amiodarone (PACERONE) 200 MG tablet, Take 1 tablet by mouth daily. , Disp: , Rfl:  .  aspirin 325 MG EC tablet, TAKE 1 TABLET BY MOUTH EVERY DAY, Disp: 90 tablet, Rfl: 1 .  atorvastatin (LIPITOR) 10 MG tablet, Take 1 tablet (10 mg total) by mouth daily., Disp: 90 tablet, Rfl: 0 .  hydroxychloroquine (PLAQUENIL) 200 MG tablet, Take by mouth., Disp: , Rfl:  .  leflunomide (ARAVA) 10 MG tablet, Take 10 mg by mouth daily. , Disp: , Rfl:  .  metoprolol succinate (TOPROL-XL) 50 MG 24 hr tablet, Take 50 mg by mouth daily. Take with or immediately following a meal., Disp: , Rfl:  .  spironolactone (ALDACTONE) 25 MG tablet, Take 0.5 tablets (12.5 mg total) by mouth daily. (Patient taking differently: Take 25 mg by mouth daily. ), Disp: 15 tablet, Rfl: 0 .  valsartan (DIOVAN) 40 MG tablet, Take 2 tablets (80 mg total) by mouth daily., Disp: 30 tablet, Rfl: 0 .  Magnesium Oxide 400 MG CAPS, Take 1 capsule (400 mg total) by mouth every morning. (Patient not taking: Reported on 04/12/2019), Disp: 30 capsule, Rfl: 0 .  potassium chloride SA (KLOR-CON) 20 MEQ tablet, Take 1 tablet (20 mEq total) by mouth daily. (Patient not taking: Reported on 04/12/2019), Disp: 30 tablet, Rfl: 0 .  predniSONE (DELTASONE) 5 MG tablet, Take 10 mg by mouth daily. , Disp: , Rfl:  .  thiamine 100 MG tablet, TAKE 1 TABLET BY MOUTH EVERY DAY (Patient not taking: Reported on 04/12/2019), Disp: 90 tablet, Rfl: 1  Not taking the prednisone  No Known Allergies   Past Surgical History:  Procedure Laterality Date  . COLONOSCOPY WITH PROPOFOL N/A 08/19/2018   Procedure: COLONOSCOPY WITH  PROPOFOL;  Surgeon: Virgel Manifold, MD;  Location: [facility] ENDOSCOPY;  Service: Endoscopy;  Laterality: N/A;  . HERNIA REPAIR Right 1973   open  . INGUINAL HERNIA REPAIR Right 11/08/2014   Procedure: LAPAROSCOPIC RIGHT INGUINAL HERNIA REPAIR;  Surgeon: Hubbard Robinson, MD;  Location: [facility] ORS;  Service: General;  Laterality: Right;  . knee arthroscopy Right 1972  . RIGHT/LEFT HEART CATH AND CORONARY ANGIOGRAPHY Right 03/05/2017   Procedure: RIGHT/LEFT HEART CATH AND CORONARY ANGIOGRAPHY;  Surgeon: Dionisio David, MD;  Location: [place] [facility];  Service: Cardiovascular;  Laterality: Right;     Family History  Problem Relation Age of Onset  . Diabetes Mother   . Cancer Father   . AAA (abdominal aortic aneurysm) Brother      Social History   Tobacco Use  . Smoking status: Never Smoker  . Smokeless tobacco: Never Used  Substance Use Topics  . Alcohol use: Not Currently    Alcohol/week: 0.0 standard drinks    Comment: 2 quarts a week, he quit again 12/2018    With staff assistance, above reviewed with the patient/daughter today.  ROS: As per HPI, otherwise no specific complaints on a limited and focused system review   No results found for this or any previous visit (from the past 72 hour(s)).   PHQ2/9: Depression screen [facility] 2/9 04/12/2019 03/20/2019 12/27/2018 12/16/2018 09/14/2018  Decreased Interest 0 0 0 0 0  Down, Depressed, Hopeless 0 0 0 0 0  PHQ - 2 Score 0 0 0 0 0  Altered sleeping 1 0 0 1 0  Tired, decreased energy 2 0 0 1 0  Change in appetite 3 0 0 2 0  Feeling bad or failure about yourself  1 0 0 0 0  Trouble concentrating 0 0 0 0 0  Moving slowly or fidgety/restless 0 0 0 - 0  Suicidal thoughts 0 0 0 0 0  PHQ-9 Score 7 0 0 4 0  Difficult doing work/chores Not difficult at all Not difficult at all Not difficult at all Somewhat difficult -  Some recent data might be hidden   PHQ-2/9 Result reviewed  Fall Risk: Fall Risk  04/12/2019 03/20/2019  12/27/2018 09/14/2018 06/10/2018  Falls in the past year? 0 0 0 0 0  Number  falls in past yr: 0 0 0 0 0  Injury with Fall? 0 0 0 0 0  Risk for fall due to : - - - - -  Follow up - - - - -      Objective:   Vitals:   04/12/19 1443  BP: 124/78  Pulse: 71  Resp: 16  Temp: 97.8 F (36.6 C)  TempSrc: Temporal  SpO2: 93%  Weight: 212 lb 9.6 oz (96.4 kg)  Height: 5\' 9"  (1.753 m)    Body mass index is 31.4 kg/m.  Physical Exam   NAD, masked HEENT - [place]/AT, no skin openings or abrasions/rash on the head noted, sclera anicteric, positive arcus, PERRL, EOMI, conj - non-inj'ed, TM's and canals clear, pharynx clear, tender at the right TMJ joint with palpation, increased with trying to open and close his jaw.  No marked disruption of the TMJ joint with opening and closing his jaw,poor dentition Neck - supple, adequate range of motion without rigidity, no adenopathy,  carotids 2+ and = without bruits bilat Car - RRR without m/g/r Pulm- RR and effort normal at rest, predominantly CTA with faint bibasilar crackles Abd - soft, NT,  Ext - no LE edema, no active joints Neuro/psychiatric - affect was not flat, appropriate with conversation  Alert and oriented  Cranial nerves II through XII intact with the exception of formal acuity not checked, no facial droop, good facial muscle strength, no tongue deviation, sensation intact to light touch over the face  Grossly non-focal - good strength on testing extremities, sensation intact to LT in distal extremities  Speech normal, gait grossly normal   Results for orders placed or performed in visit on 02/13/19  [facility] DIABETES EYE EXAM  Result Value Ref Range   [facility] Diabetic Eye Exam No Retinopathy No Retinopathy       Assessment & Plan:   1. 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  May be what is causing the headache above and also some feelings of discomfort in the ear as that is not uncommon.   No evidence of an ear infection on exam. Educated on this, and will get back on the low-dose steroid briefly and assess his response.  To take 10 mg of prednisone daily for 1 week, and then can reduce to 5 mg daily and assess his response.  If he is doing better, will stop the prednisone completely after another week and continue to monitor. Cold to the TMJ joint can also help.  2. Acute nonintractable headache, unspecified headache type TMJ may be contributing to this headache, if not causing.  Will assess his response with the prednisone above.  Headache not severe presently, and also noted no other signs of concern for possible CVA.  Educated on signs and symptoms to watch for, and if any develop, he needs to proceed to the emergency room immediately.  Both him and his daughter present were understanding of that. We will follow-up if the headache is not improving with the above. 3. Neuropathy This has been going on for years, has not changed recently, had been on gabapentin in the past, questioned if it helped some, although he is very anxious to not be on more medicines at this point.  We will continue to monitor.  4. Essential hypertension Blood pressure was good today. Continue medication regimen. 5. Rheumatoid arthritis involving multiple sites with positive rheumatoid factor (HCC) Had seen Dr. Jefm Bryant, and continues to do so.  6. Pulmonary fibrosis (HCC) Likely the cause of his faint bibasilar crackles, has been stable symptomatically to date.  His next follow-up appointment with Dr. Ancil Boozer is in May, and will follow up sooner if symptoms not improving or more problematic, and as needed.     Towanda Malkin, MD 04/12/19 3:03 PM

## 2019-04-12 ENCOUNTER — Other Ambulatory Visit: Payer: Self-pay

## 2019-04-12 ENCOUNTER — Encounter: Payer: Self-pay | Admitting: Internal Medicine

## 2019-04-12 ENCOUNTER — Ambulatory Visit (INDEPENDENT_AMBULATORY_CARE_PROVIDER_SITE_OTHER): Payer: Medicare Other | Admitting: Internal Medicine

## 2019-04-12 VITALS — BP 124/78 | HR 71 | Temp 97.8°F | Resp 16 | Ht 69.0 in | Wt 212.6 lb

## 2019-04-12 DIAGNOSIS — M0579 Rheumatoid arthritis with rheumatoid factor of multiple sites without organ or systems involvement: Secondary | ICD-10-CM

## 2019-04-12 DIAGNOSIS — I10 Essential (primary) hypertension: Secondary | ICD-10-CM | POA: Diagnosis not present

## 2019-04-12 DIAGNOSIS — G629 Polyneuropathy, unspecified: Secondary | ICD-10-CM

## 2019-04-12 DIAGNOSIS — R519 Headache, unspecified: Secondary | ICD-10-CM | POA: Diagnosis not present

## 2019-04-12 DIAGNOSIS — M26641 Arthritis of right temporomandibular joint: Secondary | ICD-10-CM | POA: Diagnosis not present

## 2019-04-12 DIAGNOSIS — J8410 Pulmonary fibrosis, unspecified: Secondary | ICD-10-CM

## 2019-04-12 NOTE — Patient Instructions (Signed)
Take the prednisone - 2 tabs daily for one week, Then decrease to 1 tab daily for one week and then stop if symptoms improved.  Any increasing HA's that are severe, any vision decrease or loss, any one-sided symptoms of concern such as weakness or numbness and tingling, or any facial droop or difficulty talking, need to be seen in the emergency room setting immediately.

## 2019-04-18 ENCOUNTER — Ambulatory Visit (INDEPENDENT_AMBULATORY_CARE_PROVIDER_SITE_OTHER): Payer: Medicare Other

## 2019-04-18 VITALS — Ht 69.0 in | Wt 213.0 lb

## 2019-04-18 DIAGNOSIS — Z0000 Encounter for general adult medical examination without abnormal findings: Secondary | ICD-10-CM | POA: Diagnosis not present

## 2019-04-18 NOTE — Progress Notes (Signed)
Subjective:   Jason Fitzgerald is a 69 y.o. male who presents for Medicare Annual/Subsequent preventive examination.  Virtual Visit via Telephone Note  I connected with Jason Fitzgerald on 04/18/19 at  8:40 AM EDT by telephone and verified that I am speaking with the correct person using two identifiers.  Medicare Annual Wellness visit completed telephonically due to Covid-19 pandemic.   Location: Patient: home Provider: office   I discussed the limitations, risks, security and privacy concerns of performing an evaluation and management service by telephone and the availability of in person appointments. The patient expressed understanding and agreed to proceed.  Some vital signs may be absent or patient reported.   Clemetine Marker, LPN    Review of Systems:   Cardiac Risk Factors include: advanced age (>91men, >4 women);hypertension;dyslipidemia;male gender     Objective:    Vitals: Ht 5\' 9"  (1.753 m)   Wt 213 lb (96.6 kg)   BMI 31.45 kg/m   Body mass index is 31.45 kg/m.  Advanced Directives 04/18/2019 12/17/2018 08/19/2018 04/14/2018 02/03/2018 02/03/2018 03/05/2017  Does Patient Have a Medical Advance Directive? No No No No No No No  Would patient like information on creating a medical advance directive? No - Patient declined No - Patient declined No - Patient declined No - Patient declined No - Patient declined - No - Patient declined    Tobacco Social History   Tobacco Use  Smoking Status Never Smoker  Smokeless Tobacco Never Used     Counseling given: Not Answered   Clinical Intake:  Pre-visit preparation completed: Yes  Pain : No/denies pain     BMI - recorded: 31.45 Nutritional Status: BMI > 30  Obese Nutritional Risks: None Diabetes: No  How often do you need to have someone help you when you read instructions, pamphlets, or other written materials from your doctor or pharmacy?: 1 - Never  Interpreter Needed?: No  Information entered by :: Clemetine Marker  LPN  Past Medical History:  Diagnosis Date  . CHF (congestive heart failure) ([place])   . Chronic cough   . COPD (chronic obstructive pulmonary disease) ([place])   . Elevated liver function tests   . Emphysema of lung ([place])   . 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 GERD (gastroesophageal reflux disease)   . History of cocaine abuse ([place])   . Mediastinal lymphadenopathy   . Pulmonary fibrosis ([place])   . Right inguinal hernia    Past Surgical History:  Procedure Laterality Date  . COLONOSCOPY WITH PROPOFOL N/A 08/19/2018   Procedure: COLONOSCOPY WITH PROPOFOL;  Surgeon: Virgel Manifold, MD;  Location: [facility] ENDOSCOPY;  Service: Endoscopy;  Laterality: N/A;  . HERNIA REPAIR Right 1973   open  . INGUINAL HERNIA REPAIR Right 11/08/2014   Procedure: LAPAROSCOPIC RIGHT INGUINAL HERNIA REPAIR;  Surgeon: Hubbard Robinson, MD;  Location: [facility] ORS;  Service: General;  Laterality: Right;  . knee arthroscopy Right 1972  . RIGHT/LEFT HEART CATH AND CORONARY ANGIOGRAPHY Right 03/05/2017   Procedure: RIGHT/LEFT HEART CATH AND CORONARY ANGIOGRAPHY;  Surgeon: Dionisio David, MD;  Location: [facility];  Service: Cardiovascular;  Laterality: Right;   Family History  Problem Relation Age of Onset  . Diabetes Mother   . Cancer Father   . AAA (abdominal aortic aneurysm) Brother    Social History   Socioeconomic History  . Marital status: Divorced    Spouse name: Not on file  . Number of children: 4  . Years  of education: Not on file  . Highest education level: Not on file  Occupational History  . Occupation: retired   Tobacco Use  . Smoking status: Never Smoker  . Smokeless tobacco: Never Used  Substance and Sexual Activity  . Alcohol use: Not Currently    Alcohol/week: 0.0 standard drinks    Comment: 2 quarts a week, he quit again 12/2018  . Drug use: Not Currently    Types: Cocaine    Comment: as an young adult   . Sexual activity: Not Currently  Other Topics  Concern  . Not on file  Social History Narrative   Retired Summer of 2019   Lives alone   He had 4 children. One son was killed years ago, but has 3 living children    Social Determinants of Health   Financial Resource Strain: Low Risk   . Difficulty of Paying Living Expenses: Not very hard  Food Insecurity: No Food Insecurity  . Worried About Charity fundraiser in the Last Year: Never true  . Ran Out of Food in the Last Year: Never true  Transportation Needs: No Transportation Needs  . Lack of Transportation (Medical): No  . Lack of Transportation (Non-Medical): No  Physical Activity: Inactive  . Days of Exercise per Week: 0 days  . Minutes of Exercise per Session: 0 min  Stress: No Stress Concern Present  . Feeling of Stress : Not at all  Social Connections: Severely Isolated  . Frequency of Communication with Friends and Family: Once a week  . Frequency of Social Gatherings with Friends and Family: Once a week  . Attends Religious Services: Never  . Active Member of Clubs or Organizations: No  . Attends Archivist Meetings: Never  . Marital Status: Divorced    Outpatient Encounter Medications as of 04/18/2019  Medication Sig  . amiodarone (PACERONE) 200 MG tablet Take 1 tablet by mouth daily.   Marland Kitchen aspirin 325 MG EC tablet TAKE 1 TABLET BY MOUTH EVERY DAY  . atorvastatin (LIPITOR) 10 MG tablet Take 1 tablet (10 mg total) by mouth daily.  . hydroxychloroquine (PLAQUENIL) 200 MG tablet Take by mouth.  . metoprolol succinate (TOPROL-XL) 50 MG 24 hr tablet Take 50 mg by mouth daily. Take with or immediately following a meal.  . potassium chloride SA (KLOR-CON) 20 MEQ tablet Take 1 tablet (20 mEq total) by mouth daily.  . predniSONE (DELTASONE) 5 MG tablet Take 10 mg by mouth daily.   Marland Kitchen spironolactone (ALDACTONE) 25 MG tablet Take 0.5 tablets (12.5 mg total) by mouth daily. (Patient taking differently: Take 25 mg by mouth daily. )  . thiamine 100 MG tablet TAKE 1 TABLET  BY MOUTH EVERY DAY  . valsartan (DIOVAN) 80 MG tablet Take 80 mg by mouth daily.  . [DISCONTINUED] leflunomide (ARAVA) 10 MG tablet Take 10 mg by mouth daily.   . [DISCONTINUED] Magnesium Oxide 400 MG CAPS Take 1 capsule (400 mg total) by mouth every morning.  . [DISCONTINUED] valsartan (DIOVAN) 40 MG tablet Take 2 tablets (80 mg total) by mouth daily.   No facility-administered encounter medications on file as of 04/18/2019.    Activities of Daily Living In your present state of health, do you have any difficulty performing the following activities: 04/18/2019 04/12/2019  Hearing? N N  Comment declines hearing aids -  Vision? Y Y  Difficulty concentrating or making decisions? N N  Walking or climbing stairs? Y Y  Comment - stairs  Dressing or  bathing? N N  Doing errands, shopping? N N  Preparing Food and eating ? N -  Using the Toilet? N -  In the past six months, have you accidently leaked urine? N -  Do you have problems with loss of bowel control? N -  Managing your Medications? N -  Managing your Finances? N -  Housekeeping or managing your Housekeeping? N -  Some recent data might be hidden    Patient Care Team: Steele Sizer, MD as PCP - General (Family Medicine) Erby Pian, MD as Referring Physician (Specialist) Allyne Gee, MD as Consulting Physician (Internal Medicine) Emmaline Kluver., MD (Rheumatology) Sharlotte Alamo, DPM (Podiatry) Alisa Graff, FNP   Assessment:   This is a routine wellness examination for [place].  Exercise Activities and Dietary recommendations Current Exercise Habits: The patient does not participate in regular exercise at present, Exercise limited by: orthopedic condition(s)  Goals    . DIET - INCREASE WATER INTAKE     Recommend to drink 6-8 glasses of water per day.        Fall Risk Fall Risk  04/18/2019 04/12/2019 03/20/2019 12/27/2018 09/14/2018  Falls in the past year? 0 0 0 0 0  Number falls in past yr: 0 0 0 0 0    Injury with Fall? 0 0 0 0 0  Risk for fall due to : Orthopedic patient - - - -  Follow up Falls prevention discussed - - - -   FALL RISK PREVENTION PERTAINING TO THE HOME:  Any stairs in or around the home? No  If so, do they handrails? No   Home free of loose throw rugs in walkways, pet beds, electrical cords, etc? Yes  Adequate lighting in your home to reduce risk of falls? Yes   ASSISTIVE DEVICES UTILIZED TO PREVENT FALLS:  Life alert? No  Use of a cane, walker or w/c? No  Grab bars in the bathroom? No  Shower chair or bench in shower? No  Elevated toilet seat or a handicapped toilet? No   DME ORDERS:  DME order needed?  No   TIMED UP AND GO:  Was the test performed? No . Telephonic visit.   Education: Fall risk prevention has been discussed.  Intervention(s) required? No    Depression Screen PHQ 2/9 Scores 04/18/2019 04/12/2019 03/20/2019 12/27/2018  PHQ - 2 Score 0 0 0 0  PHQ- 9 Score - 7 0 0    Cognitive Function     6CIT Screen 04/18/2019 04/14/2018  What Year? 0 points 0 points  What month? 0 points 0 points  What time? 0 points 0 points  Count back from 20 0 points 0 points  Months in reverse 4 points 4 points  Repeat phrase 4 points 4 points  Total Score 8 8    Immunization History  Administered Date(s) Administered  . Fluad Quad(high Dose 65+) 12/16/2018  . Influenza Split 01/07/2015  . Influenza, High Dose Seasonal PF 12/02/2017  . Influenza-Unspecified 11/27/2009, 11/06/2016  . Pneumococcal Conjugate-13 03/14/2018  . Pneumococcal Polysaccharide-23 03/20/2019    Qualifies for Shingles Vaccine? Yes . Due for Shingrix. Education has been provided regarding the importance of this vaccine. Pt has been advised to call insurance company to determine out of pocket expense. Advised may also receive vaccine at local pharmacy or Health Dept. Verbalized acceptance and understanding.  Tdap: Although this vaccine is not a covered service during a Wellness  Exam, does the patient still wish to receive  this vaccine today?  No .  Education has been provided regarding the importance of this vaccine. Advised may receive this vaccine at local pharmacy or Health Dept. Aware to provide a copy of the vaccination record if obtained from local pharmacy or Health Dept. Verbalized acceptance and understanding.  Flu Vaccine: Up to date  Pneumococcal Vaccine: Up to date   Screening Tests Health Maintenance  Topic Date Due  . TETANUS/TDAP  03/19/2020 (Originally 01/14/1970)  . COLONOSCOPY  08/18/2021  . INFLUENZA VACCINE  Completed  . Hepatitis C Screening  Completed  . PNA vac Low Risk Adult  Completed   Cancer Screenings:  Colorectal Screening: Completed 08/19/18. Repeat every 3 years;   Lung Cancer Screening: (Low Dose CT Chest recommended if Age 27-80 years, 30 pack-year currently smoking OR have quit w/in 15years.) does not qualify.   Additional Screening:  Hepatitis C Screening: does qualify; Completed 12/08/17.  Vision Screening: Recommended annual ophthalmology exams for early detection of glaucoma and other disorders of the eye. Is the patient up to date with their annual eye exam?  Yes  Who is the provider or what is the name of the office in which the pt attends annual eye exams? [facility] Screening: Recommended annual dental exams for proper oral hygiene  Community Resource Referral:  CRR required this visit?  No       Plan:    I have personally reviewed and addressed the Medicare Annual Wellness questionnaire and have noted the following in the patient's chart:  A. Medical and social history B. Use of alcohol, tobacco or illicit drugs  C. Current medications and supplements D. Functional ability and status E.  Nutritional status F.  Physical activity G. Advance directives H. List of other physicians I.  Hospitalizations, surgeries, and ER visits in previous 12 months J.  [place] such as  hearing and vision if needed, cognitive and depression L. Referrals and appointments   In addition, I have reviewed and discussed with patient certain preventive protocols, quality metrics, and best practice recommendations. A written personalized care plan for preventive services as well as general preventive health recommendations were provided to patient.   Signed,  Clemetine Marker, LPN Nurse Health Advisor   Nurse Notes: none

## 2019-04-18 NOTE — Patient Instructions (Signed)
Jason Fitzgerald , Thank you for taking time to come for your Medicare Wellness Visit. I appreciate your ongoing commitment to your health goals. Please review the following plan we discussed and let me know if I can assist you in the future.   Screening recommendations/referrals: Colonoscopy: done 08/19/18. Repeat in 2023.  Recommended yearly ophthalmology/optometry visit for glaucoma screening and checkup Recommended yearly dental visit for hygiene and checkup  Vaccinations: Influenza vaccine: done 12/16/18 Pneumococcal vaccine: done 03/20/19 Tdap vaccine: due Shingles vaccine: Shingrix discussed. Please contact your pharmacy for coverage information.     Advanced directives: Advance directive discussed with you today. Even though you declined this today please call our office should you change your mind and we can give you the proper paperwork for you to fill out.  Conditions/risks identified: Recommend increasing physical activity to at least 3 days per week  Next appointment: Please follow up in one year for your Medicare Annual Wellness visit.    Preventive Care 68 Years and Older, Male Preventive care refers to lifestyle choices and visits with your health care provider that can promote health and wellness. What does preventive care include?  A yearly physical exam. This is also called an annual well check.  Dental exams once or twice a year.  Routine eye exams. Ask your health care provider how often you should have your eyes checked.  Personal lifestyle choices, including:  Daily care of your teeth and gums.  Regular physical activity.  Eating a healthy diet.  Avoiding tobacco and drug use.  Limiting alcohol use.  Practicing safe sex.  Taking low doses of aspirin every day.  Taking vitamin and mineral supplements as recommended by your health care provider. What happens during an annual well check? The services and screenings done by your health care provider during  your annual well check will depend on your age, overall health, lifestyle risk factors, and family history of disease. Counseling  Your health care provider may ask you questions about your:  Alcohol use.  Tobacco use.  Drug use.  Emotional well-being.  Home and relationship well-being.  Sexual activity.  Eating habits.  History of falls.  Memory and ability to understand (cognition).  Work and work Statistician. Screening  You may have the following tests or measurements:  Height, weight, and BMI.  Blood pressure.  Lipid and cholesterol levels. These may be checked every 5 years, or more frequently if you are over 54 years old.  Skin check.  Lung cancer screening. You may have this screening every year starting at age 34 if you have a 30-pack-year history of smoking and currently smoke or have quit within the past 15 years.  Fecal occult blood test (FOBT) of the stool. You may have this test every year starting at age 12.  Flexible sigmoidoscopy or colonoscopy. You may have a sigmoidoscopy every 5 years or a colonoscopy every 10 years starting at age 44.  Prostate cancer screening. Recommendations will vary depending on your family history and other risks.  Hepatitis C blood test.  Hepatitis B blood test.  Sexually transmitted disease (STD) testing.  Diabetes screening. This is done by checking your blood sugar (glucose) after you have not eaten for a while (fasting). You may have this done every 1-3 years.  Abdominal aortic aneurysm (AAA) screening. You may need this if you are a current or former smoker.  Osteoporosis. You may be screened starting at age 55 if you are at high risk. Talk with your health  care provider about your test results, treatment options, and if necessary, the need for more tests. Vaccines  Your health care provider may recommend certain vaccines, such as:  Influenza vaccine. This is recommended every year.  Tetanus, diphtheria, and  acellular pertussis (Tdap, Td) vaccine. You may need a Td booster every 10 years.  Zoster vaccine. You may need this after age 16.  Pneumococcal 13-valent conjugate (PCV13) vaccine. One dose is recommended after age 35.  Pneumococcal polysaccharide (PPSV23) vaccine. One dose is recommended after age 65. Talk to your health care provider about which screenings and vaccines you need and how often you need them. This information is not intended to replace advice given to you by your health care provider. Make sure you discuss any questions you have with your health care provider. Document Released: 02/15/2015 Document Revised: 10/09/2015 Document Reviewed: 11/20/2014 Elsevier Interactive Patient Education  2017 [place] Prevention in the Home Falls can cause injuries. They can happen to people of all ages. There are many things you can do to make your home safe and to help prevent falls. What can I do on the outside of my home?  Regularly fix the edges of walkways and driveways and fix any cracks.  Remove anything that might make you trip as you walk through a door, such as a raised step or threshold.  Trim any bushes or trees on the path to your home.  Use bright outdoor lighting.  Clear any walking paths of anything that might make someone trip, such as rocks or tools.  Regularly check to see if handrails are loose or broken. Make sure that both sides of any steps have handrails.  Any raised decks and porches should have guardrails on the edges.  Have any leaves, snow, or ice cleared regularly.  Use sand or salt on walking paths during winter.  Clean up any spills in your garage right away. This includes oil or grease spills. What can I do in the bathroom?  Use night lights.  Install grab bars by the toilet and in the tub and shower. Do not use towel bars as grab bars.  Use non-skid mats or decals in the tub or shower.  If you need to sit down in the shower, use  a plastic, non-slip stool.  Keep the floor dry. Clean up any water that spills on the floor as soon as it happens.  Remove soap buildup in the tub or shower regularly.  Attach bath mats securely with double-sided non-slip rug tape.  Do not have throw rugs and other things on the floor that can make you trip. What can I do in the bedroom?  Use night lights.  Make sure that you have a light by your bed that is easy to reach.  Do not use any sheets or blankets that are too big for your bed. They should not hang down onto the floor.  Have a firm chair that has side arms. You can use this for support while you get dressed.  Do not have throw rugs and other things on the floor that can make you trip. What can I do in the kitchen?  Clean up any spills right away.  Avoid walking on wet floors.  Keep items that you use a lot in easy-to-reach places.  If you need to reach something above you, use a strong step stool that has a grab bar.  Keep electrical cords out of the way.  Do not use floor  polish or wax that makes floors slippery. If you must use wax, use non-skid floor wax.  Do not have throw rugs and other things on the floor that can make you trip. What can I do with my stairs?  Do not leave any items on the stairs.  Make sure that there are handrails on both sides of the stairs and use them. Fix handrails that are broken or loose. Make sure that handrails are as long as the stairways.  Check any carpeting to make sure that it is firmly attached to the stairs. Fix any carpet that is loose or worn.  Avoid having throw rugs at the top or bottom of the stairs. If you do have throw rugs, attach them to the floor with carpet tape.  Make sure that you have a light switch at the top of the stairs and the bottom of the stairs. If you do not have them, ask someone to add them for you. What else can I do to help prevent falls?  Wear shoes that:  Do not have high heels.  Have  rubber bottoms.  Are comfortable and fit you well.  Are closed at the toe. Do not wear sandals.  If you use a stepladder:  Make sure that it is fully opened. Do not climb a closed stepladder.  Make sure that both sides of the stepladder are locked into place.  Ask someone to hold it for you, if possible.  Clearly mark and make sure that you can see:  Any grab bars or handrails.  First and last steps.  Where the edge of each step is.  Use tools that help you move around (mobility aids) if they are needed. These include:  Canes.  Walkers.  Scooters.  Crutches.  Turn on the lights when you go into a dark area. Replace any light bulbs as soon as they burn out.  Set up your furniture so you have a clear path. Avoid moving your furniture around.  If any of your floors are uneven, fix them.  If there are any pets around you, be aware of where they are.  Review your medicines with your doctor. Some medicines can make you feel dizzy. This can increase your chance of falling. Ask your doctor what other things that you can do to help prevent falls. This information is not intended to replace advice given to you by your health care provider. Make sure you discuss any questions you have with your health care provider. Document Released: 11/15/2008 Document Revised: 06/27/2015 Document Reviewed: 02/23/2014 Elsevier Interactive Patient Education  2017 [employer].

## 2019-05-16 DIAGNOSIS — R0602 Shortness of breath: Secondary | ICD-10-CM | POA: Diagnosis not present

## 2019-05-16 DIAGNOSIS — I10 Essential (primary) hypertension: Secondary | ICD-10-CM | POA: Diagnosis not present

## 2019-05-16 DIAGNOSIS — I509 Heart failure, unspecified: Secondary | ICD-10-CM | POA: Diagnosis not present

## 2019-05-16 DIAGNOSIS — I4891 Unspecified atrial fibrillation: Secondary | ICD-10-CM | POA: Diagnosis not present

## 2019-05-16 DIAGNOSIS — I2510 Atherosclerotic heart disease of native coronary artery without angina pectoris: Secondary | ICD-10-CM | POA: Diagnosis not present

## 2019-05-25 DIAGNOSIS — I5032 Chronic diastolic (congestive) heart failure: Secondary | ICD-10-CM | POA: Diagnosis not present

## 2019-05-25 DIAGNOSIS — M0579 Rheumatoid arthritis with rheumatoid factor of multiple sites without organ or systems involvement: Secondary | ICD-10-CM | POA: Diagnosis not present

## 2019-05-25 DIAGNOSIS — Z79899 Other long term (current) drug therapy: Secondary | ICD-10-CM | POA: Diagnosis not present

## 2019-05-25 DIAGNOSIS — J84112 Idiopathic pulmonary fibrosis: Secondary | ICD-10-CM | POA: Diagnosis not present

## 2019-06-19 ENCOUNTER — Ambulatory Visit (INDEPENDENT_AMBULATORY_CARE_PROVIDER_SITE_OTHER): Payer: Medicare Other | Admitting: Family Medicine

## 2019-06-19 ENCOUNTER — Encounter: Payer: Self-pay | Admitting: Family Medicine

## 2019-06-19 DIAGNOSIS — I10 Essential (primary) hypertension: Secondary | ICD-10-CM | POA: Diagnosis not present

## 2019-06-19 DIAGNOSIS — E785 Hyperlipidemia, unspecified: Secondary | ICD-10-CM | POA: Diagnosis not present

## 2019-06-19 DIAGNOSIS — E519 Thiamine deficiency, unspecified: Secondary | ICD-10-CM | POA: Diagnosis not present

## 2019-06-19 DIAGNOSIS — I4891 Unspecified atrial fibrillation: Secondary | ICD-10-CM

## 2019-06-19 DIAGNOSIS — J8410 Pulmonary fibrosis, unspecified: Secondary | ICD-10-CM

## 2019-06-19 DIAGNOSIS — G629 Polyneuropathy, unspecified: Secondary | ICD-10-CM

## 2019-06-19 DIAGNOSIS — I2510 Atherosclerotic heart disease of native coronary artery without angina pectoris: Secondary | ICD-10-CM

## 2019-06-19 DIAGNOSIS — M0579 Rheumatoid arthritis with rheumatoid factor of multiple sites without organ or systems involvement: Secondary | ICD-10-CM

## 2019-06-19 DIAGNOSIS — F1021 Alcohol dependence, in remission: Secondary | ICD-10-CM

## 2019-06-19 MED ORDER — ATORVASTATIN CALCIUM 10 MG PO TABS: 10.0000 mg | ORAL_TABLET | Freq: Every day | ORAL | 0 refills | Status: DC

## 2019-06-19 MED ORDER — THIAMINE HCL 100 MG PO TABS: 100.0000 mg | ORAL_TABLET | Freq: Every day | ORAL | 1 refills | Status: DC

## 2019-06-19 NOTE — Progress Notes (Signed)
Name: Jason Fitzgerald   MRN: [id number]    DOB: December 17, 1950   Date:06/19/2019       Progress Note  Subjective  Chief Complaint  Chief Complaint  Patient presents with  . Atrial Fibrillation  . Cough    He has a cough that comes and goes x 2 weeks. Mostly after he finishes eating.  . Knee Pain    He has soreness in his left knee. He thinks it is arthritis. He is taking medication for it.  . Medication Refill    Needs refill on B1.    I connected with  [name] on 06/19/19 at  9:40 AM EDT by telephone and verified that I am speaking with the correct person using two identifiers.  I discussed the limitations, risks, security and privacy concerns of performing an evaluation and management service by telephone and the availability of in person appointments. Staff also discussed with the patient that there may be a patient responsible charge related to this service. Patient Location: at home Provider Location: [facility]   HPI  History of alcoholism: he quit early 2020, had a relapse but said not drinking since end of 2020. He is drinking water, sweet tea  and ginger ale. Appetite waxes and wanes     HTN/CHF and Afib: rate controlled with beta-blocker, amiodarone, also taking aldactone,off lasix andpotassium. He has SOB with a activities but denies, palpitationor orthopnea, hestates dizziness has resolved once he started getting up slowly. It was a virtual visit and he states he is back on furosemide  RA: under the care of Dr. Jefm Bryant. Heis off plaquenil ( he states stopped by cardiologist) still takingprednisone and [place] . He states pain is under control at this timehe states, he states only problem currently left knee stiffness and mild pain when he first stands up. Sed rate was in the 80's back in April. No effusion or redness  CAD: he states he has been told he had a heart attack, not on statin, normal LDL,he is on statin therapy. He is under the care of Dr. Humphrey Rolls    Pulmonary fiboris: never smoked, states his mother did and also co-workers, under the care of Dr. Raul Del, has daily cough but nowheezing , he has SOB with activity , CT chest done 2019 showed bilateral interstitial lung opacities with pulmonary fibrosis.He states cough has been worse after meals for a while, no fever or chills.   IMPRESSION:  1. 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 [place], Iss 5,  [phone], Oct 03 2016.  2. One vessel coronary atherosclerosis.  3. Prominent symmetric bilateral gynecomastia, worsened.   Neuropathy: on both feet, he states gabapentin has helped left leg more than right ,he has toe tingling. He has seen Dr. Manuella Ghazi in the past. He states feeling better since he quit drinking , he would like a refill of vitamin B1  Anemia: he has not seen hematologist, platelets has normalized anemia has been stable. Reviewed labs done at Dr. Scharlene Gloss office 05/2019   Patient Active Problem List   Diagnosis Date Noted  . Diarrhea   . Dizziness 12/17/2018  . Hypotension 12/17/2018  . Hypophosphatemia 12/17/2018  . Hypomagnesemia 12/17/2018  . Positive D-dimer 12/17/2018  . Abnormal LFTs (  liver function tests) 12/17/2018  . Anemia 12/17/2018  . Pulmonary fibrosis ([place])   . GERD (gastroesophageal reflux disease)   . COPD (chronic obstructive pulmonary disease) ([place])   . CHF (congestive heart failure) ([place])   . Encounter for screening colonoscopy   . Polyp of transverse colon   . Internal hemorrhoids   . Hypokalemia 02/03/2018  . History of alcoholism ([place]) 12/08/2017  . Rheumatoid arthritis involving multiple sites with positive  rheumatoid factor ([place]) 10/13/2017  . Chronic systolic CHF (congestive heart failure) ([place]) 03/12/2017  . AF (paroxysmal atrial fibrillation) ([place]) 03/12/2017  . Arthritis of left knee 08/01/2016  . 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 Chronic cough 05/29/2015  . CAFL (chronic airflow limitation) ([place]) 10/24/2014  . Arteriosclerosis of coronary artery 10/24/2014  . History of fundoplication 16/02/930  . HTN (hypertension) 10/24/2014  . LAD (lymphadenopathy), mediastinal 07/13/2013  . Interstitial lung disease ([place]) 06/19/2013  . 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 COLONOSCOPY WITH PROPOFOL N/A 08/19/2018   Procedure: COLONOSCOPY WITH PROPOFOL;  Surgeon: Virgel Manifold, MD;  Location: [facility] ENDOSCOPY;  Service: Endoscopy;  Laterality: N/A;  . HERNIA REPAIR Right 1973   open  . INGUINAL HERNIA REPAIR Right 11/08/2014   Procedure: LAPAROSCOPIC RIGHT INGUINAL HERNIA REPAIR;  Surgeon: [place] Robinson, MD;  Location: [facility] ORS;  Service: General;  Laterality: Right;  . knee arthroscopy Right 1972  . RIGHT/LEFT HEART CATH AND CORONARY ANGIOGRAPHY Right 03/05/2017   Procedure: RIGHT/LEFT HEART CATH AND CORONARY ANGIOGRAPHY;  Surgeon: Dionisio David, MD;  Location: [place] [facility];  Service: Cardiovascular;  Laterality: Right;    Family History  Problem Relation Age of Onset  . Diabetes Mother   . Cancer Father   . AAA (abdominal aortic aneurysm) Brother     Social History   Socioeconomic History  . Marital status: Divorced    Spouse name: Not on file  . Number of children: 4  . Years of education: Not on file  . Highest education level: Not on file  Occupational History  . Occupation: retired   Tobacco Use  . Smoking status: Never Smoker  . Smokeless tobacco: Never Used  Substance and Sexual Activity  . Alcohol use: Not Currently    Alcohol/week: 0.0 standard drinks     Comment: 2 quarts a week, he quit again 12/2018  . Drug use: Not Currently    Types: Cocaine    Comment: as an young adult   . Sexual activity: Not Currently  Other Topics Concern  . Not on file  Social History Narrative   Retired Summer of 2019   Lives alone   He had 4 children. One son was killed years ago, but has 3 living children    Social Determinants of Health   Financial Resource Strain: Low Risk   . Difficulty of Paying Living Expenses: Not very hard  Food Insecurity: No Food Insecurity  . Worried About Charity fundraiser in the Last Year: Never true  . Ran Out of Food in the Last Year: Never true  Transportation Needs: No Transportation Needs  . Lack of Transportation (Medical): No  . Lack of Transportation (Non-Medical): No  Physical Activity: Inactive  . Days of Exercise per Week: 0 days  . Minutes of Exercise per Session: 0 min  Stress: No Stress Concern Present  . Feeling of Stress : Not at all  Social Connections: Severely Isolated  .  Frequency of Communication with Friends and Family: Once a week  . Frequency of Social Gatherings with Friends and Family: Once a week  . Attends Religious Services: Never  . Active Member of Clubs or Organizations: No  . Attends Archivist Meetings: Never  . Marital Status: Divorced  Human resources officer Violence: Not At Risk  . Fear of Current or Ex-Partner: No  . Emotionally Abused: No  . Physically Abused: No  . Sexually Abused: No     Current Outpatient Medications:  .  amiodarone (PACERONE) 200 MG tablet, Take 1 tablet by mouth daily. , Disp: , Rfl:  .  aspirin 325 MG EC tablet, TAKE 1 TABLET BY MOUTH EVERY DAY, Disp: 90 tablet, Rfl: 1 .  atorvastatin (LIPITOR) 10 MG tablet, Take 1 tablet (10 mg total) by mouth daily., Disp: 90 tablet, Rfl: 0 .  budesonide-formoterol (SYMBICORT) 160-4.5 MCG/ACT inhaler, Inhale into the lungs., Disp: , Rfl:  .  hydroxychloroquine (PLAQUENIL) 200 MG tablet, Take by mouth., Disp: ,  Rfl:  .  metoprolol succinate (TOPROL-XL) 50 MG 24 hr tablet, Take 50 mg by mouth daily. Take with or immediately following a meal., Disp: , Rfl:  .  potassium chloride SA (KLOR-CON) 20 MEQ tablet, Take 1 tablet (20 mEq total) by mouth daily., Disp: 30 tablet, Rfl: 0 .  predniSONE (DELTASONE) 5 MG tablet, Take 10 mg by mouth daily. , Disp: , Rfl:  .  spironolactone (ALDACTONE) 25 MG tablet, Take 0.5 tablets (12.5 mg total) by mouth daily. (Patient taking differently: Take 25 mg by mouth daily. ), Disp: 15 tablet, Rfl: 0 .  thiamine 100 MG tablet, TAKE 1 TABLET BY MOUTH EVERY DAY, Disp: 90 tablet, Rfl: 1 .  valsartan (DIOVAN) 80 MG tablet, Take 80 mg by mouth daily., Disp: , Rfl:   No Known Allergies  I personally reviewed active problem list, medication list, allergies, family history, social history, health maintenance with the patient/caregiver today.   ROS  Ten systems reviewed and is negative except as mentioned in HPI   Objective  Virtual encounter, vitals not obtained.  There is no height or weight on file to calculate BMI.  Physical Exam  Awake, alert and oriented   PHQ2/9: Depression screen [facility] 2/9 06/19/2019 04/18/2019 04/12/2019 03/20/2019 12/27/2018  Decreased Interest 0 0 0 0 0  Down, Depressed, Hopeless 0 0 0 0 0  PHQ - 2 Score 0 0 0 0 0  Altered sleeping 0 - 1 0 0  Tired, decreased energy 0 - 2 0 0  Change in appetite 0 - 3 0 0  Feeling bad or failure about yourself  0 - 1 0 0  Trouble concentrating 0 - 0 0 0  Moving slowly or fidgety/restless 0 - 0 0 0  Suicidal thoughts 0 - 0 0 0  PHQ-9 Score 0 - 7 0 0  Difficult doing work/chores - - Not difficult at all Not difficult at all Not difficult at all  Some recent data might be hidden   PHQ-2/9 Result is negative.    Fall Risk: Fall Risk  06/19/2019 04/18/2019 04/12/2019 03/20/2019 12/27/2018  Falls in the past year? 0 0 0 0 0  Number falls in past yr: 0 0 0 0 0  Injury with Fall? 0 0 0 0 0  Risk for fall due to :  - Orthopedic patient - - -  Follow up - Falls prevention discussed - - -    Assessment & Plan  1. Dyslipidemia  -  atorvastatin (LIPITOR) 10 MG tablet; Take 1 tablet (10 mg total) by mouth daily.  Dispense: 90 tablet; Refill: 0  2. Coronary artery disease involving native coronary artery of native heart without angina pectoris  - atorvastatin (LIPITOR) 10 MG tablet; Take 1 tablet (10 mg total) by mouth daily.  Dispense: 90 tablet; Refill: 0  3. Vitamin B1 deficiency  - thiamine 100 MG tablet; Take 1 tablet (100 mg total) by mouth daily. Every other day  Dispense: 45 tablet; Refill: 1  4. Neuropathy   5. Essential hypertension  At goal   6. Rheumatoid arthritis involving multiple sites with positive rheumatoid factor (HCC)  Under the care of Dr. Jefm Bryant   7. Pulmonary fibrosis [facility])  He sees Dr. Raul Del   8. Atrial fibrillation, unspecified type (HCC)  No palpitation   9. History of alcoholism ([place])  - thiamine 100 MG tablet; Take 1 tablet (100 mg total) by mouth daily. Every other day  Dispense: 45 tablet; Refill: 1  I discussed the assessment and treatment plan with the patient. The patient was provided an opportunity to ask questions and all were answered. The patient agreed with the plan and demonstrated an understanding of the instructions.   The patient was advised to call back or seek an in-person evaluation if the symptoms worsen or if the condition fails to improve as anticipated.  I provided 25 minutes of non-face-to-face time during this encounter.  Loistine Chance, MD

## 2019-07-20 DIAGNOSIS — J84112 Idiopathic pulmonary fibrosis: Secondary | ICD-10-CM | POA: Diagnosis not present

## 2019-07-20 DIAGNOSIS — J439 Emphysema, unspecified: Secondary | ICD-10-CM | POA: Diagnosis not present

## 2019-07-20 DIAGNOSIS — M0579 Rheumatoid arthritis with rheumatoid factor of multiple sites without organ or systems involvement: Secondary | ICD-10-CM | POA: Diagnosis not present

## 2019-07-20 DIAGNOSIS — Z01818 Encounter for other preprocedural examination: Secondary | ICD-10-CM | POA: Diagnosis not present

## 2019-07-20 DIAGNOSIS — M25512 Pain in left shoulder: Secondary | ICD-10-CM | POA: Diagnosis not present

## 2019-07-20 DIAGNOSIS — R0600 Dyspnea, unspecified: Secondary | ICD-10-CM | POA: Diagnosis not present

## 2019-08-03 ENCOUNTER — Other Ambulatory Visit: Payer: Self-pay | Admitting: Family Medicine

## 2019-08-03 NOTE — Telephone Encounter (Signed)
Requested medication (s) are due for refill today:no  Requested medication (s) are on the active medication list: yes  Last refill:  04/06/2019  Future visit scheduled: yes  Notes to clinic:  medication refill by different provider  Review for refill   Requested Prescriptions  Pending Prescriptions Disp Refills   spironolactone (ALDACTONE) 25 MG tablet [Pharmacy Med Name: SPIRONOLACTONE 25 MG TABLET] 15 tablet 0    Sig: TAKE 1/2 TABLET BY MOUTH EVERY DAY      Cardiovascular: Diuretics - Aldosterone Antagonist Passed - 08/03/2019  9:02 AM      Passed - Cr in normal range and within 360 days    Creat  Date Value Ref Range Status  01/09/2019 0.80 0.70 - 1.25 mg/dL Final    Comment:    For patients >65 years of age, the reference limit for Creatinine is approximately 13% higher for people identified as African-American. .    Creatinine, Urine  Date Value Ref Range Status  12/24/2017 226 20 - 320 mg/dL Final          Passed - K in normal range and within 360 days    Potassium  Date Value Ref Range Status  01/09/2019 3.6 3.5 - 5.3 mmol/L Final          Passed - Na in normal range and within 360 days    Sodium  Date Value Ref Range Status  01/09/2019 143 135 - 146 mmol/L Final  12/17/2018 141 137 - 147 Final          Passed - Last BP in normal range    BP Readings from Last 1 Encounters:  04/12/19 124/78          Passed - Valid encounter within last 6 months    Recent Outpatient Visits           1 month ago Neuropathy   [facility] Steele Sizer, MD   3 months ago Arthritis of right temporomandibular joint   [place], MD   4 months ago Atrial fibrillation, unspecified type [facility])   [facility] Steele Sizer, MD   7 months ago Pancytopenia [facility])   [facility] Steele Sizer, MD   7 months ago History of alcoholism [facility])   [place] Sizer, MD       Future Appointments             In 1 month Ancil Boozer, Drue Stager, MD [facility], [place]   In 8 months  [place]

## 2019-08-05 ENCOUNTER — Inpatient Hospital Stay
Admission: EM | Admit: 2019-08-05 | Discharge: 2019-08-07 | DRG: 293 | Disposition: A | Discharge status: Home / Self Care | Payer: Medicare Other | Attending: Internal Medicine | Admitting: Internal Medicine

## 2019-08-05 ENCOUNTER — Other Ambulatory Visit: Payer: Self-pay

## 2019-08-05 ENCOUNTER — Emergency Department: Payer: Medicare Other

## 2019-08-05 ENCOUNTER — Inpatient Hospital Stay
Admit: 2019-08-05 | Discharge: 2019-08-05 | Disposition: A | Discharge status: Home / Self Care | Payer: Medicare Other | Attending: Nurse Practitioner | Admitting: Nurse Practitioner

## 2019-08-05 ENCOUNTER — Encounter: Payer: Self-pay | Admitting: Emergency Medicine

## 2019-08-05 DIAGNOSIS — Z7951 Long term (current) use of inhaled steroids: Secondary | ICD-10-CM | POA: Diagnosis not present

## 2019-08-05 DIAGNOSIS — I110 Hypertensive heart disease with heart failure: Secondary | ICD-10-CM | POA: Diagnosis not present

## 2019-08-05 DIAGNOSIS — T502X5A Adverse effect of carbonic-anhydrase inhibitors, benzothiadiazides and other diuretics, initial encounter: Secondary | ICD-10-CM | POA: Diagnosis not present

## 2019-08-05 DIAGNOSIS — I2510 Atherosclerotic heart disease of native coronary artery without angina pectoris: Secondary | ICD-10-CM | POA: Diagnosis not present

## 2019-08-05 DIAGNOSIS — M069 Rheumatoid arthritis, unspecified: Secondary | ICD-10-CM | POA: Diagnosis present

## 2019-08-05 DIAGNOSIS — Z6828 Body mass index (BMI) 28.0-28.9, adult: Secondary | ICD-10-CM

## 2019-08-05 DIAGNOSIS — I480 Paroxysmal atrial fibrillation: Secondary | ICD-10-CM | POA: Diagnosis not present

## 2019-08-05 DIAGNOSIS — I5031 Acute diastolic (congestive) heart failure: Secondary | ICD-10-CM

## 2019-08-05 DIAGNOSIS — Z20822 Contact with and (suspected) exposure to covid-19: Secondary | ICD-10-CM | POA: Diagnosis not present

## 2019-08-05 DIAGNOSIS — I493 Ventricular premature depolarization: Secondary | ICD-10-CM | POA: Diagnosis not present

## 2019-08-05 DIAGNOSIS — I5033 Acute on chronic diastolic (congestive) heart failure: Secondary | ICD-10-CM | POA: Diagnosis not present

## 2019-08-05 DIAGNOSIS — J810 Acute pulmonary edema: Secondary | ICD-10-CM | POA: Diagnosis not present

## 2019-08-05 DIAGNOSIS — D638 Anemia in other chronic diseases classified elsewhere: Secondary | ICD-10-CM | POA: Diagnosis not present

## 2019-08-05 DIAGNOSIS — Y92239 Unspecified place in hospital as the place of occurrence of the external cause: Secondary | ICD-10-CM | POA: Diagnosis not present

## 2019-08-05 DIAGNOSIS — K219 Gastro-esophageal reflux disease without esophagitis: Secondary | ICD-10-CM | POA: Diagnosis not present

## 2019-08-05 DIAGNOSIS — I509 Heart failure, unspecified: Secondary | ICD-10-CM

## 2019-08-05 DIAGNOSIS — Z7952 Long term (current) use of systemic steroids: Secondary | ICD-10-CM | POA: Diagnosis not present

## 2019-08-05 DIAGNOSIS — J439 Emphysema, unspecified: Secondary | ICD-10-CM | POA: Diagnosis present

## 2019-08-05 DIAGNOSIS — Z833 Family history of diabetes mellitus: Secondary | ICD-10-CM

## 2019-08-05 DIAGNOSIS — E785 Hyperlipidemia, unspecified: Secondary | ICD-10-CM | POA: Diagnosis not present

## 2019-08-05 DIAGNOSIS — J8410 Pulmonary fibrosis, unspecified: Secondary | ICD-10-CM | POA: Diagnosis present

## 2019-08-05 DIAGNOSIS — E876 Hypokalemia: Secondary | ICD-10-CM | POA: Diagnosis not present

## 2019-08-05 DIAGNOSIS — Z79899 Other long term (current) drug therapy: Secondary | ICD-10-CM

## 2019-08-05 DIAGNOSIS — J9601 Acute respiratory failure with hypoxia: Secondary | ICD-10-CM | POA: Diagnosis not present

## 2019-08-05 DIAGNOSIS — R0789 Other chest pain: Secondary | ICD-10-CM | POA: Diagnosis not present

## 2019-08-05 DIAGNOSIS — Z7982 Long term (current) use of aspirin: Secondary | ICD-10-CM

## 2019-08-05 DIAGNOSIS — E663 Overweight: Secondary | ICD-10-CM | POA: Diagnosis present

## 2019-08-05 DIAGNOSIS — I5041 Acute combined systolic (congestive) and diastolic (congestive) heart failure: Secondary | ICD-10-CM | POA: Diagnosis not present

## 2019-08-05 DIAGNOSIS — J90 Pleural effusion, not elsewhere classified: Secondary | ICD-10-CM | POA: Diagnosis not present

## 2019-08-05 DIAGNOSIS — I10 Essential (primary) hypertension: Secondary | ICD-10-CM | POA: Diagnosis not present

## 2019-08-05 DIAGNOSIS — I5043 Acute on chronic combined systolic (congestive) and diastolic (congestive) heart failure: Secondary | ICD-10-CM | POA: Diagnosis not present

## 2019-08-05 DIAGNOSIS — R079 Chest pain, unspecified: Secondary | ICD-10-CM | POA: Diagnosis not present

## 2019-08-05 DIAGNOSIS — R0602 Shortness of breath: Secondary | ICD-10-CM | POA: Diagnosis not present

## 2019-08-05 LAB — TROPONIN I (HIGH SENSITIVITY)
Troponin I (High Sensitivity): 21 ng/L — ABNORMAL HIGH (ref <18)
Troponin I (High Sensitivity): 24 ng/L — ABNORMAL HIGH (ref <18)

## 2019-08-05 LAB — BRAIN NATRIURETIC PEPTIDE: B Natriuretic Peptide: 1110.6 pg/mL — ABNORMAL HIGH (ref 0.0–100.0)

## 2019-08-05 LAB — CBC
HCT: 33.7 % — ABNORMAL LOW (ref 39.0–52.0)
Hemoglobin: 10.9 g/dL — ABNORMAL LOW (ref 13.0–17.0)
MCH: 26.6 pg (ref 26.0–34.0)
MCHC: 32.3 g/dL (ref 30.0–36.0)
MCV: 82.2 fL (ref 80.0–100.0)
Platelets: 274 10*3/uL (ref 150–400)
RBC: 4.1 MIL/uL — ABNORMAL LOW (ref 4.22–5.81)
RDW: 20.4 % — ABNORMAL HIGH (ref 11.5–15.5)
WBC: 6.6 10*3/uL (ref 4.0–10.5)
nRBC: 0 % (ref 0.0–0.2)

## 2019-08-05 LAB — BASIC METABOLIC PANEL
Anion gap: 10 (ref 5–15)
BUN: 17 mg/dL (ref 8–23)
CO2: 27 mmol/L (ref 22–32)
Calcium: 8.4 mg/dL — ABNORMAL LOW (ref 8.9–10.3)
Chloride: 105 mmol/L (ref 98–111)
Creatinine, Ser: 0.71 mg/dL (ref 0.61–1.24)
GFR calc Af Amer: >60 mL/min (ref >60)
GFR calc non Af Amer: >60 mL/min (ref >60)
Glucose, Bld: 140 mg/dL — ABNORMAL HIGH (ref 70–99)
Potassium: 3.5 mmol/L (ref 3.5–5.1)
Sodium: 142 mmol/L (ref 135–145)

## 2019-08-05 LAB — SARS CORONAVIRUS 2 BY RT PCR (HOSPITAL ORDER, PERFORMED IN ~~LOC~~ HOSPITAL LAB): SARS Coronavirus 2: NEGATIVE

## 2019-08-05 MED ORDER — SODIUM CHLORIDE 0.9% FLUSH: 3.0000 mL | INTRAVENOUS | Status: DC | PRN

## 2019-08-05 MED ORDER — SODIUM CHLORIDE 0.9 % IV SOLN: 250.0000 mL | INTRAVENOUS | Status: DC | PRN

## 2019-08-05 MED ORDER — PREDNISONE 10 MG PO TABS
10.0000 mg | ORAL_TABLET | Freq: Every day | ORAL | Status: DC
  Administered 2019-08-05 – 2019-08-07 (×3): 10 mg via ORAL
  Filled 2019-08-05 (×3): qty 1

## 2019-08-05 MED ORDER — ZOLPIDEM TARTRATE 5 MG PO TABS: 5.0000 mg | ORAL_TABLET | Freq: Every evening | ORAL | Status: DC | PRN

## 2019-08-05 MED ORDER — RIVAROXABAN 20 MG PO TABS
20.0000 mg | ORAL_TABLET | Freq: Every day | ORAL | Status: DC
  Administered 2019-08-05 – 2019-08-06 (×2): 20 mg via ORAL
  Filled 2019-08-05 (×2): qty 1

## 2019-08-05 MED ORDER — AMIODARONE HCL 200 MG PO TABS: 200.0000 mg | ORAL_TABLET | Freq: Every day | ORAL | Status: DC

## 2019-08-05 MED ORDER — POTASSIUM CHLORIDE CRYS ER 20 MEQ PO TBCR: 20.0000 meq | EXTENDED_RELEASE_TABLET | Freq: Every day | ORAL | Status: DC

## 2019-08-05 MED ORDER — PERFLUTREN LIPID MICROSPHERE
1.0000 mL | INTRAVENOUS | Status: AC | PRN
  Administered 2019-08-05: 4 mL via INTRAVENOUS
  Filled 2019-08-05: qty 10

## 2019-08-05 MED ORDER — HYDROXYCHLOROQUINE SULFATE 200 MG PO TABS
200.0000 mg | ORAL_TABLET | Freq: Every day | ORAL | Status: DC
  Administered 2019-08-05 – 2019-08-07 (×3): 200 mg via ORAL
  Filled 2019-08-05 (×3): qty 1

## 2019-08-05 MED ORDER — THIAMINE HCL 100 MG PO TABS
100.0000 mg | ORAL_TABLET | Freq: Every day | ORAL | Status: DC
  Administered 2019-08-05 – 2019-08-07 (×3): 100 mg via ORAL
  Filled 2019-08-05 (×3): qty 1

## 2019-08-05 MED ORDER — ONDANSETRON HCL 4 MG/2ML IJ SOLN: 4.0000 mg | Freq: Four times a day (QID) | INTRAMUSCULAR | Status: DC | PRN

## 2019-08-05 MED ORDER — ENOXAPARIN SODIUM 40 MG/0.4ML ~~LOC~~ SOLN
40.0000 mg | SUBCUTANEOUS | Status: DC
  Administered 2019-08-05: 40 mg via SUBCUTANEOUS
  Filled 2019-08-05: qty 0.4

## 2019-08-05 MED ORDER — SPIRONOLACTONE 25 MG PO TABS
12.5000 mg | ORAL_TABLET | Freq: Every day | ORAL | Status: DC
  Administered 2019-08-05: 12.5 mg via ORAL
  Filled 2019-08-05: qty 0.5
  Filled 2019-08-05: qty 1

## 2019-08-05 MED ORDER — ACETAMINOPHEN 325 MG PO TABS
650.0000 mg | ORAL_TABLET | ORAL | Status: DC | PRN
  Administered 2019-08-06: 650 mg via ORAL
  Filled 2019-08-05: qty 2

## 2019-08-05 MED ORDER — ALPRAZOLAM 0.25 MG PO TABS: 0.2500 mg | ORAL_TABLET | Freq: Two times a day (BID) | ORAL | Status: DC | PRN

## 2019-08-05 MED ORDER — ATORVASTATIN CALCIUM 10 MG PO TABS
10.0000 mg | ORAL_TABLET | Freq: Every day | ORAL | Status: DC
  Administered 2019-08-05 – 2019-08-06 (×2): 10 mg via ORAL
  Filled 2019-08-05 (×2): qty 1

## 2019-08-05 MED ORDER — FUROSEMIDE 10 MG/ML IJ SOLN
60.0000 mg | Freq: Once | INTRAMUSCULAR | Status: AC
  Administered 2019-08-05: 60 mg via INTRAVENOUS
  Filled 2019-08-05: qty 8

## 2019-08-05 MED ORDER — FUROSEMIDE 10 MG/ML IJ SOLN
60.0000 mg | Freq: Two times a day (BID) | INTRAMUSCULAR | Status: DC
  Administered 2019-08-05 – 2019-08-07 (×5): 60 mg via INTRAVENOUS
  Filled 2019-08-05 (×5): qty 6

## 2019-08-05 MED ORDER — IRBESARTAN 75 MG PO TABS
75.0000 mg | ORAL_TABLET | Freq: Every day | ORAL | Status: DC
  Administered 2019-08-05 – 2019-08-07 (×3): 75 mg via ORAL
  Filled 2019-08-05 (×3): qty 1

## 2019-08-05 MED ORDER — METOPROLOL SUCCINATE ER 50 MG PO TB24
50.0000 mg | ORAL_TABLET | Freq: Every day | ORAL | Status: DC
  Administered 2019-08-05 – 2019-08-07 (×3): 50 mg via ORAL
  Filled 2019-08-05 (×3): qty 1

## 2019-08-05 MED ORDER — ASPIRIN EC 325 MG PO TBEC
325.0000 mg | DELAYED_RELEASE_TABLET | Freq: Every day | ORAL | Status: DC
  Administered 2019-08-05: 325 mg via ORAL
  Filled 2019-08-05: qty 1

## 2019-08-05 MED ORDER — SPIRONOLACTONE 25 MG PO TABS
25.0000 mg | ORAL_TABLET | Freq: Every day | ORAL | Status: DC
  Administered 2019-08-06 – 2019-08-07 (×2): 25 mg via ORAL
  Filled 2019-08-05 (×2): qty 1

## 2019-08-05 MED ORDER — SODIUM CHLORIDE 0.9% FLUSH
3.0000 mL | Freq: Two times a day (BID) | INTRAVENOUS | Status: DC
  Administered 2019-08-05 – 2019-08-07 (×5): 3 mL via INTRAVENOUS

## 2019-08-05 NOTE — Consult Note (Signed)
Jason Fitzgerald is a 69 y.o. male  [id number]  Primary Cardiologist: Neoma Laming Reason for Consultation: Combined HFrEF/HFpEF  HPI: Patient is a 68 year old male with past medical history of combined HFrEF/HFpEF, COPD, PAF, and IPF presenting to the emergency room with worsening dyspnea, orthopnea, and lower extremity edema.  Most recent echocardiogram from February 2021 revealed EF 50-55%, mild MR/TR, grade 1 diastolic dysfunction.  We have been consulted for further management of the patient's combined HFrEF and HFpEF.   Review of Systems: Patient continues to have DOE and orthopnea as well as lower extremity edema.  Patient denies chest pain.   Past Medical History:  Diagnosis Date  . CHF (congestive heart failure) ([place])   . Chronic cough   . COPD (chronic obstructive pulmonary disease) ([place])   . Elevated liver function tests   . Emphysema of lung ([place])   . Fibrosis, pulmonary, interstitial, diffuse ([place])   . GERD (gastroesophageal reflux disease)   . History of cocaine abuse ([place])   . Mediastinal lymphadenopathy   . Pulmonary fibrosis ([place])   . Right inguinal hernia     Medications Prior to Admission  Medication Sig Dispense Refill  . amiodarone (PACERONE) 200 MG tablet Take 1 tablet by mouth daily.     Marland Kitchen aspirin 325 MG EC tablet TAKE 1 TABLET BY MOUTH EVERY DAY 90 tablet 1  . atorvastatin (LIPITOR) 10 MG tablet Take 1 tablet (10 mg total) by mouth daily. 90 tablet 0  . budesonide-formoterol (SYMBICORT) 160-4.5 MCG/ACT inhaler Inhale into the lungs.    . hydroxychloroquine (PLAQUENIL) 200 MG tablet Take by mouth.    . metoprolol succinate (TOPROL-XL) 50 MG 24 hr tablet Take 50 mg by mouth daily. Take with or immediately following a meal.    . potassium chloride SA (KLOR-CON) 20 MEQ tablet Take 1 tablet (20 mEq total) by mouth daily. 30 tablet 0  . predniSONE (DELTASONE) 5 MG tablet Take 10 mg by mouth daily.     Marland Kitchen spironolactone (ALDACTONE) 25 MG tablet TAKE 1/2 TABLET  BY MOUTH EVERY DAY 15 tablet 0  . thiamine 100 MG tablet Take 1 tablet (100 mg total) by mouth daily. Every other day 45 tablet 1  . valsartan (DIOVAN) 80 MG tablet Take 80 mg by mouth daily.       Marland Kitchen atorvastatin  10 mg Oral Daily  . enoxaparin (LOVENOX) injection  40 mg Subcutaneous Q24H  . furosemide  60 mg Intravenous Q12H  . hydroxychloroquine  200 mg Oral Daily  . irbesartan  75 mg Oral Daily  . metoprolol succinate  50 mg Oral Daily  . predniSONE  10 mg Oral Daily  . rivaroxaban  20 mg Oral Q supper  . sodium chloride flush  3 mL Intravenous Q12H  . [START ON 08/06/2019] spironolactone  25 mg Oral Daily  . thiamine  100 mg Oral Daily    Infusions: . sodium chloride      No Known Allergies  Social History   Socioeconomic History  . Marital status: Divorced    Spouse name: Not on file  . Number of children: 4  . Years of education: Not on file  . Highest education level: Not on file  Occupational History  . Occupation: retired   Tobacco Use  . Smoking status: Never Smoker  . Smokeless tobacco: Never Used  Vaping Use  . Vaping Use: Never used  Substance and Sexual Activity  . Alcohol use: Not Currently    Alcohol/week:  0.0 standard drinks    Comment: 2 quarts a week, he quit again 12/2018  . Drug use: Not Currently    Types: Cocaine    Comment: as an young adult   . Sexual activity: Not Currently  Other Topics Concern  . Not on file  Social History Narrative   Retired Summer of 2019   Lives alone   He had 4 children. One son was killed years ago, but has 3 living children    Social Determinants of Health   Financial Resource Strain: Low Risk   . Difficulty of Paying Living Expenses: Not very hard  Food Insecurity: No Food Insecurity  . Worried About Charity fundraiser in the Last Year: Never true  . Ran Out of Food in the Last Year: Never true  Transportation Needs: No Transportation Needs  . Lack of Transportation (Medical): No  . Lack of  Transportation (Non-Medical): No  Physical Activity: Inactive  . Days of Exercise per Week: 0 days  . Minutes of Exercise per Session: 0 min  Stress: No Stress Concern Present  . Feeling of Stress : Not at all  Social Connections: Socially Isolated  . Frequency of Communication with Friends and Family: Once a week  . Frequency of Social Gatherings with Friends and Family: Once a week  . Attends Religious Services: Never  . Active Member of Clubs or Organizations: No  . Attends Archivist Meetings: Never  . Marital Status: Divorced  Human resources officer Violence: Not At Risk  . Fear of Current or Ex-Partner: No  . Emotionally Abused: No  . Physically Abused: No  . Sexually Abused: No    Family History  Problem Relation Age of Onset  . Diabetes Mother   . Cancer Father   . AAA (abdominal aortic aneurysm) Brother     PHYSICAL EXAM: Vitals:   08/05/19 0714 08/05/19 0900  BP: (!) 148/76   Pulse: 78 82  Resp: 18   Temp: 98.9 F (37.2 C)   SpO2: 100%      Intake/Output Summary (Last 24 hours) at 08/05/2019 1036 Last data filed at 08/05/2019 0827 Gross per 24 hour  Intake 237 ml  Output 4930 ml  Net -4693 ml    General:  Well appearing. No respiratory difficulty HEENT: normal Neck: supple. no JVD. Carotids 2+ bilat; no bruits. No lymphadenopathy or thryomegaly appreciated. Cor: PMI nondisplaced. Regular rate & rhythm. No rubs, gallops or murmurs. Lungs: clear Abdomen: soft, nontender, nondistended. No hepatosplenomegaly. No bruits or masses. Good bowel sounds. Extremities: no cyanosis, clubbing, rash.  +2 pitting BLE edema Neuro: alert & oriented x 3, cranial nerves grossly intact. moves all 4 extremities w/o difficulty. Affect pleasant.  ECG: Normal sinus rhythm with PVCs, left axis deviation, and T wave inversion in the anterioseptally.  81/BPM  Results for orders placed or performed during the hospital encounter of 08/05/19 (from the past 24 hour(s))  Basic  metabolic panel     Status: Abnormal   Collection Time: 08/05/19  1:52 AM  Result Value Ref Range   Sodium 142 135 - 145 mmol/L   Potassium 3.5 3.5 - 5.1 mmol/L   Chloride 105 98 - 111 mmol/L   CO2 27 22 - 32 mmol/L   Glucose, Bld 140 (H) 70 - 99 mg/dL   BUN 17 8 - 23 mg/dL   Creatinine, Ser 0.71 0.61 - 1.24 mg/dL   Calcium 8.4 (L) 8.9 - 10.3 mg/dL   GFR calc non Af Amer >  60 >60 mL/min   GFR calc Af Amer >60 >60 mL/min   Anion gap 10 5 - 15  CBC     Status: Abnormal   Collection Time: 08/05/19  1:52 AM  Result Value Ref Range   WBC 6.6 4.0 - 10.5 K/uL   RBC 4.10 (L) 4.22 - 5.81 MIL/uL   Hemoglobin 10.9 (L) 13.0 - 17.0 g/dL   HCT 33.7 (L) 39 - 52 %   MCV 82.2 80.0 - 100.0 fL   MCH 26.6 26.0 - 34.0 pg   MCHC 32.3 30.0 - 36.0 g/dL   RDW 20.4 (H) 11.5 - 15.5 %   Platelets 274 150 - 400 K/uL   nRBC 0.0 0.0 - 0.2 %  Troponin I (High Sensitivity)     Status: Abnormal   Collection Time: 08/05/19  1:52 AM  Result Value Ref Range   Troponin I (High Sensitivity) 21 (H) <18 ng/L  Brain natriuretic peptide     Status: Abnormal   Collection Time: 08/05/19  1:52 AM  Result Value Ref Range   B Natriuretic Peptide 1,110.6 (H) 0.0 - 100.0 pg/mL  SARS Coronavirus 2 by RT PCR (hospital order, performed in [place] hospital lab) Nasopharyngeal Nasopharyngeal Swab     Status: None   Collection Time: 08/05/19  3:04 AM   Specimen: Nasopharyngeal Swab  Result Value Ref Range   SARS Coronavirus 2 NEGATIVE NEGATIVE  Troponin I (High Sensitivity)     Status: Abnormal   Collection Time: 08/05/19  4:00 AM  Result Value Ref Range   Troponin I (High Sensitivity) 24 (H) <18 ng/L   DG Chest 2 View  Result Date: 08/05/2019 CLINICAL DATA:  Chest pain and shortness of breath, progressive over the past 2 weeks. EXAM: CHEST - 2 VIEW COMPARISON:  Radiograph 02/03/2018.  Chest CT 12/17/2018 FINDINGS: Chronic interstitial lung disease with reticulation and honeycombing in a basilar predominant distribution.  There has been slight increase from January 2020 radiograph. Minimal fluid in the fissures and small bilateral pleural effusions. The heart is normal in size. No confluent airspace disease. No pneumothorax. No acute osseous abnormalities. IMPRESSION: 1. Chronic interstitial lung disease. 2. Slight increase in interstitial opacities from prior exam may represent progression of chronic lung disease versus superimposed pulmonary edema or atypical infection. 3. Minimal fluid in the fissures with small pleural effusions. Electronically Signed   By: Keith Rake M.D.   On: 08/05/2019 02:45     ASSESSMENT AND PLAN: 69 year old male presenting to the emergency department with worsening dyspnea, orthopnea, and bilateral extremity edema.  Patient is having a HFpEF exacerbation.  We will plan to repeat echo.  Please continue IV diuresis with Lasix and plan to increase Aldactone to 25 mg daily.  In the setting of HFpEF exacerbation please continue metoprolol succinate 50 mg and irbesartan.  Patient recently seen by his pulmonologist and it was determined he is having worsening pulmonary fibrosis.  With this we will discontinue his amiodarone.  With a diagnosis of PAF, patient previously on Xarelto as an outpatient and we will restart that today with the discontinuation of aspirin.  We will continue to follow.  Adaline Sill NP-C

## 2019-08-05 NOTE — Progress Notes (Signed)
*  PRELIMINARY RESULTS* Echocardiogram 2D Echocardiogram has been performed. Definity IV Contrast used on this study.  Jason Fitzgerald Jason Fitzgerald 08/05/2019, 3:14 PM

## 2019-08-05 NOTE — ED Provider Notes (Signed)
[facility] Emergency Department Provider Note  Time seen: 2:47 AM  I have reviewed the triage vital signs and the nursing notes.   HISTORY  Chief Complaint Chest Pain and Shortness of Breath   HPI Jason Fitzgerald is a 69 y.o. male with a past medical history of CHF, COPD, emphysema, gastric reflux, pulmonary fibrosis, presents to the emergency department for shortness of breath and chest discomfort.  According to the patient for the past 2 weeks or so he has had worsening shortness of breath and chest discomfort.  Patient states he sees his pulmonologist who currently has him on prednisone but states that shortness of breath is worsened over the past week or so.  States he becomes very short of breath if he attempts to lie down.  Denies any fever.  No significant cough.  Describes her chest discomfort is mild and more of a tightness sensation in the chest.  Patient does state increased swelling in the lower extremities compared to baseline.  Past Medical History:  Diagnosis Date  . CHF (congestive heart failure) ([place])   . Chronic cough   . COPD (chronic obstructive pulmonary disease) ([place])   . Elevated liver function tests   . Emphysema of lung ([place])   . Fibrosis, pulmonary, interstitial, diffuse ([place])   . GERD (gastroesophageal reflux disease)   . History of cocaine abuse ([place])   . Mediastinal lymphadenopathy   . Pulmonary fibrosis ([place])   . Right inguinal hernia     Patient Active Problem List   Diagnosis Date Noted  . Diarrhea   . Dizziness 12/17/2018  . Hypotension 12/17/2018  . Hypophosphatemia 12/17/2018  . Hypomagnesemia 12/17/2018  . Positive D-dimer 12/17/2018  . Abnormal LFTs (liver function tests) 12/17/2018  . Anemia 12/17/2018  . Pulmonary fibrosis ([place])   . GERD (gastroesophageal reflux disease)   . COPD (chronic obstructive pulmonary disease) ([place])   . CHF (congestive heart failure) ([place])   . Encounter for screening colonoscopy   .  Polyp of transverse colon   . Internal hemorrhoids   . Hypokalemia 02/03/2018  . History of alcoholism ([place]) 12/08/2017  . Rheumatoid arthritis involving multiple sites with positive rheumatoid factor ([place]) 10/13/2017  . Chronic systolic CHF (congestive heart failure) ([place]) 03/12/2017  . AF (paroxysmal atrial fibrillation) ([place]) 03/12/2017  . Arthritis of left knee 08/01/2016  . 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 Chronic cough 05/29/2015  . CAFL (chronic airflow limitation) ([place]) 10/24/2014  . Arteriosclerosis of coronary artery 10/24/2014  . History of fundoplication 50/27/7412  . HTN (hypertension) 10/24/2014  . LAD (lymphadenopathy), mediastinal 07/13/2013  . Interstitial lung disease ([place]) 06/19/2013  . 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 COLONOSCOPY WITH PROPOFOL N/A 08/19/2018   Procedure: COLONOSCOPY WITH PROPOFOL;  Surgeon: Virgel Manifold, MD;  Location: [facility] ENDOSCOPY;  Service: Endoscopy;  Laterality: N/A;  . HERNIA REPAIR Right 1973   open  . INGUINAL HERNIA REPAIR Right 11/08/2014   Procedure: LAPAROSCOPIC RIGHT INGUINAL HERNIA REPAIR;  Surgeon: Hubbard Robinson, MD;  Location: [facility] ORS;  Service: General;  Laterality: Right;  . knee arthroscopy Right 1972  . RIGHT/LEFT HEART CATH AND CORONARY ANGIOGRAPHY Right 03/05/2017   Procedure: RIGHT/LEFT HEART CATH AND CORONARY ANGIOGRAPHY;  Surgeon: Dionisio David, MD;  Location: [place] CV LAB;  Service: Cardiovascular;  Laterality: Right;    Prior to Admission medications   Medication Sig Start Date End Date  Taking? Authorizing Provider  amiodarone (PACERONE) 200 MG tablet Take 1 tablet by mouth daily.  05/19/18   Allyne Gee, MD  aspirin 325 MG EC tablet TAKE 1 TABLET BY MOUTH EVERY DAY 03/14/19   Ancil Boozer, Drue Stager, MD  atorvastatin (LIPITOR) 10 MG tablet Take 1 tablet (10 mg total) by mouth daily. 06/19/19   Steele Sizer,  MD  budesonide-formoterol [facility]) 160-4.5 MCG/ACT inhaler Inhale into the lungs. 05/17/18   [provider]  hydroxychloroquine (PLAQUENIL) 200 MG tablet Take by mouth. 03/20/19   [provider]  metoprolol succinate (TOPROL-XL) 50 MG 24 hr tablet Take 50 mg by mouth daily. Take with or immediately following a meal.    Neoma Laming A, MD  potassium chloride SA (KLOR-CON) 20 MEQ tablet Take 1 tablet (20 mEq total) by mouth daily. 12/20/18   Loletha Grayer, MD  predniSONE (DELTASONE) 5 MG tablet Take 10 mg by mouth daily.  01/22/17   Emmaline Kluver., MD  spironolactone (ALDACTONE) 25 MG tablet TAKE 1/2 TABLET BY MOUTH EVERY DAY 08/03/19   Steele Sizer, MD  thiamine 100 MG tablet Take 1 tablet (100 mg total) by mouth daily. Every other day 06/19/19   Steele Sizer, MD  valsartan (DIOVAN) 80 MG tablet Take 80 mg by mouth daily. 04/14/19   [provider]    No Known Allergies  Family History  Problem Relation Age of Onset  . Diabetes Mother   . Cancer Father   . AAA (abdominal aortic aneurysm) Brother     Social History Social History   Tobacco Use  . Smoking status: Never Smoker  . Smokeless tobacco: Never Used  Vaping Use  . Vaping Use: Never used  Substance Use Topics  . Alcohol use: Not Currently    Alcohol/week: 0.0 standard drinks    Comment: 2 quarts a week, he quit again 12/2018  . Drug use: Not Currently    Types: Cocaine    Comment: as an young adult     Review of Systems Constitutional: Negative for fever. Cardiovascular: Mild chest pain Respiratory: Moderate shortness of breath, worse when lying flat. Gastrointestinal: Negative for abdominal pain Musculoskeletal: Increased lower extremity edema Skin: Negative for skin complaints  Neurological: Negative for headache All other ROS negative  ____________________________________________   PHYSICAL EXAM:  VITAL SIGNS: ED Triage Vitals [08/05/19 0145]  Enc Vitals Group      BP (!) 157/98     Pulse      Resp (!) 24     Temp 97.8 F (36.6 C)     Temp Source Oral     SpO2 97 %     Weight 215 lb (97.5 kg)     Height 5\' 9"  (1.753 m)     Head Circumference      Peak Flow      Pain Score 9     Pain Loc      Pain Edu?      Excl. in [place]?    Constitutional: Alert and oriented. Well appearing and in no distress. Eyes: Normal exam ENT      Head: Normocephalic and atraumatic.      Mouth/Throat: Mucous membranes are moist. Cardiovascular: Normal rate, regular rhythm.  Respiratory: Mild tachypnea.  Mild crackles throughout. Gastrointestinal: Soft and nontender. No distention. Musculoskeletal: Nontender with normal range of motion in all extremities.  2+ lower extreme edema equal bilaterally. Neurologic:  Normal speech and language. No gross focal neurologic deficits Skin:  Skin is warm, dry  and intact.  Psychiatric: Mood and affect are normal.  ____________________________________________    EKG  EKG viewed and interpreted by myself shows a normal sinus rhythm at 81 bpm with a narrow QRS, left axis deviation, largely normal intervals, nonspecific ST changes.  ____________________________________________    RADIOLOGY  Chest x-ray shows pulmonary fibrosis with possible superimposed pulmonary edema  ____________________________________________   INITIAL IMPRESSION / ASSESSMENT AND PLAN / ED COURSE  Pertinent labs & imaging results that were available during my care of the patient were reviewed by me and considered in my medical decision making (see chart for details).   Patient presents emergency department for worsening shortness of breath and mild chest discomfort.  We will check labs including cardiac enzymes and a BNP.  We'll obtain a chest x-ray and continue to closely monitor.  Patient is tachypneic at times breathing around 25 breaths/min currently.  O2 saturation in the mid to upper 90s.  Does have lower extremity edema  X-ray shows  pulmonary fibrosis with superimposed edema versus atypical infection.  Clinical picture much more consistent with pulmonary edema.  We will dose IV Lasix.  Spoke to the hospitalist who will be admitting the patient to their service.   [name] was evaluated in Emergency Department on 08/05/2019 for the symptoms described in the history of present illness. He was evaluated in the context of the global COVID-19 pandemic, which necessitated consideration that the patient might be at risk for infection with the SARS-CoV-2 virus that causes COVID-19. Institutional protocols and algorithms that pertain to the evaluation of patients at risk for COVID-19 are in a state of rapid change based on information released by regulatory bodies including the CDC and federal and state organizations. These policies and algorithms were followed during the patient's care in the ED.  ____________________________________________   FINAL CLINICAL IMPRESSION(S) / ED DIAGNOSES  Peripheral edema Dyspnea Chest pain   Harvest Dark, MD 08/05/19 [phone]

## 2019-08-05 NOTE — ED Notes (Signed)
RN was able to locate ER provider and notified of pts increased work of breathing

## 2019-08-05 NOTE — Plan of Care (Signed)
  Problem: Health Behavior/Discharge Planning: Goal: Ability to manage health-related needs will improve Outcome: Progressing   Problem: Clinical Measurements: Goal: Respiratory complications will improve Outcome: Progressing   Problem: Pain Managment: Goal: General experience of comfort will improve Outcome: Progressing   

## 2019-08-05 NOTE — ED Notes (Signed)
RN attempting to find ER provider to inform him of 0313 assessment findings.

## 2019-08-05 NOTE — H&P (Signed)
[facility] [place] NAME: Jason Fitzgerald    MR#:  [id number]  DATE OF BIRTH:  15-Apr-1950  DATE OF ADMISSION:  08/05/2019  PRIMARY CARE PHYSICIAN: Steele Sizer, MD   REQUESTING/REFERRING PHYSICIAN: Harvest Dark, MD  CHIEF COMPLAINT:   Chief Complaint  Patient presents with  . Chest Pain  . Shortness of Breath    HISTORY OF PRESENT ILLNESS:  Jason Fitzgerald  is a 69 y.o. African-American male with a known history of systolic and diastolic CHF, COPD, IPF, GERD and cocaine abuse, who presented to the emergency room with acute onset of worsening dyspnea with associated cough productive of clear sputum as well as wheezing over the last 2 weeks with associated significant worsening of his lower extremity edema. He denied any fever or chills. Denied any nausea or vomiting or abdominal pain. He has been having chest soreness with cough. He admitted to orthopnea and recent weight gain. No dysuria, oliguria or hematuria or flank pain. His most recent 2D echo 6 months ago showing EF of 35% with grade 1 diastolic dysfunction, mild mitral regurgitation and mild left atrial dilatation as well as mild right ventricular dilatation.  Upon presentation to the emergency room, blood pressure was 157/98 with respirate of 24 And later blood pressure was 132/105 with respirate of 31 with otherwise normal vital signs.  Labs revealed borderline potassium 3.5 and a BNP of 1110.6 with anemia that is at baseline. COVID-19 PCR came back negative.  EKG showed sinus rhythm with a rate of 81 with occasional PVCs, left axis deviation, prolonged QT interval with QTC of 460 MS with T wave version anteroseptally.  Chest x-ray showed the following: 1. Chronic interstitial lung disease. 2. Slight increase in interstitial opacities from prior exam may represent progression of chronic lung disease versus superimposed pulmonary edema or atypical infection. 3. Minimal fluid in the fissures  with small pleural effusions.  The patient was given 60 mg of IV Lasix.  He will be admitted to progressive unit bed for further evaluation and management.   PAST MEDICAL HISTORY:   Past Medical History:  Diagnosis Date  . CHF (congestive heart failure) ([place])   . Chronic cough   . COPD (chronic obstructive pulmonary disease) ([place])   . Elevated liver function tests   . Emphysema of lung ([place])   . Fibrosis, pulmonary, interstitial, diffuse ([place])   . GERD (gastroesophageal reflux disease)   . History of cocaine abuse ([place])   . Mediastinal lymphadenopathy   . Pulmonary fibrosis ([place])   . Right inguinal hernia     PAST SURGICAL HISTORY:   Past Surgical History:  Procedure Laterality Date  . COLONOSCOPY WITH PROPOFOL N/A 08/19/2018   Procedure: COLONOSCOPY WITH PROPOFOL;  Surgeon: Virgel Manifold, MD;  Location: [facility] ENDOSCOPY;  Service: Endoscopy;  Laterality: N/A;  . HERNIA REPAIR Right 1973   open  . INGUINAL HERNIA REPAIR Right 11/08/2014   Procedure: LAPAROSCOPIC RIGHT INGUINAL HERNIA REPAIR;  Surgeon: Hubbard Robinson, MD;  Location: [facility] ORS;  Service: General;  Laterality: Right;  . knee arthroscopy Right 1972  . RIGHT/LEFT HEART CATH AND CORONARY ANGIOGRAPHY Right 03/05/2017   Procedure: RIGHT/LEFT HEART CATH AND CORONARY ANGIOGRAPHY;  Surgeon: Dionisio David, MD;  Location: [place] CV LAB;  Service: Cardiovascular;  Laterality: Right;    SOCIAL HISTORY:   Social History   Tobacco Use  . Smoking status: Never Smoker  . Smokeless tobacco: Never Used  Substance Use  Topics  . Alcohol use: Not Currently    Alcohol/week: 0.0 standard drinks    Comment: 2 quarts a week, he quit again 12/2018    FAMILY HISTORY:   Family History  Problem Relation Age of Onset  . Diabetes Mother   . Cancer Father   . AAA (abdominal aortic aneurysm) Brother     DRUG ALLERGIES:  No Known Allergies  REVIEW OF SYSTEMS:   ROS As per history of present illness. All  pertinent systems were reviewed above. Constitutional,  HEENT, cardiovascular, respiratory, GI, GU, musculoskeletal, neuro, psychiatric, endocrine,  integumentary and hematologic systems were reviewed and are otherwise  negative/unremarkable except for positive findings mentioned above in the HPI.   MEDICATIONS AT HOME:   Prior to Admission medications   Medication Sig Start Date End Date Taking? Authorizing Provider  amiodarone (PACERONE) 200 MG tablet Take 1 tablet by mouth daily.  05/19/18   Allyne Gee, MD  aspirin 325 MG EC tablet TAKE 1 TABLET BY MOUTH EVERY DAY 03/14/19   Ancil Boozer, Drue Stager, MD  atorvastatin (LIPITOR) 10 MG tablet Take 1 tablet (10 mg total) by mouth daily. 06/19/19   Steele Sizer, MD  budesonide-formoterol [facility]) 160-4.5 MCG/ACT inhaler Inhale into the lungs. 05/17/18   [provider]  hydroxychloroquine (PLAQUENIL) 200 MG tablet Take by mouth. 03/20/19   [provider]  metoprolol succinate (TOPROL-XL) 50 MG 24 hr tablet Take 50 mg by mouth daily. Take with or immediately following a meal.    Neoma Laming A, MD  potassium chloride SA (KLOR-CON) 20 MEQ tablet Take 1 tablet (20 mEq total) by mouth daily. 12/20/18   Loletha Grayer, MD  predniSONE (DELTASONE) 5 MG tablet Take 10 mg by mouth daily.  01/22/17   Emmaline Kluver., MD  spironolactone (ALDACTONE) 25 MG tablet TAKE 1/2 TABLET BY MOUTH EVERY DAY 08/03/19   Steele Sizer, MD  thiamine 100 MG tablet Take 1 tablet (100 mg total) by mouth daily. Every other day 06/19/19   Steele Sizer, MD  valsartan (DIOVAN) 80 MG tablet Take 80 mg by mouth daily. 04/14/19   [provider]      VITAL SIGNS:  Blood pressure (!) 132/105, pulse 78, temperature 97.8 F (36.6 C), temperature source Oral, resp. rate (!) 31, height 5\' 9"  (1.753 m), weight 97.5 kg, SpO2 94 %.  PHYSICAL EXAMINATION:  Physical Exam  GENERAL:  69 y.o.-year-old African-American male patient lying in the bed in  mild respiratory distress with conversational dyspnea. EYES: Pupils equal, round, reactive to light and accommodation. No scleral icterus. Extraocular muscles intact.  HEENT: Head atraumatic, normocephalic. Oropharynx and nasopharynx clear.  NECK:  Supple, no jugular venous distention. No thyroid enlargement, no tenderness.  LUNGS: Diminished bibasal breath sounds with bibasal rales. CARDIOVASCULAR: Regular rate and rhythm, S1, S2 normal. No murmurs, rubs, or gallops.  ABDOMEN: Soft, nondistended, nontender. Bowel sounds present. No organomegaly or mass.  EXTREMITIES: 2-3+ bilateral lower extremity pitting edema, with no cyanosis, or clubbing.  NEUROLOGIC: Cranial nerves II through XII are intact. Muscle strength 5/5 in all extremities. Sensation intact. Gait not checked.  PSYCHIATRIC: The patient is alert and oriented x 3.  Normal affect and good eye contact. SKIN: No obvious rash, lesion, or ulcer.   LABORATORY PANEL:   CBC Recent Labs  Lab 08/05/19 0152  WBC 6.6  HGB 10.9*  HCT 33.7*  PLT 274   ------------------------------------------------------------------------------------------------------------------  Chemistries  Recent Labs  Lab 08/05/19 0152  NA 142  K 3.5  CL 105  CO2 27  GLUCOSE 140*  BUN 17  CREATININE 0.71  CALCIUM 8.4*   ------------------------------------------------------------------------------------------------------------------  Cardiac Enzymes No results for input(s): TROPONINI in the last 168 hours. ------------------------------------------------------------------------------------------------------------------  RADIOLOGY:  DG Chest 2 View  Result Date: 08/05/2019 CLINICAL DATA:  Chest pain and shortness of breath, progressive over the past 2 weeks. EXAM: CHEST - 2 VIEW COMPARISON:  Radiograph 02/03/2018.  Chest CT 12/17/2018 FINDINGS: Chronic interstitial lung disease with reticulation and honeycombing in a basilar predominant distribution.  There has been slight increase from January 2020 radiograph. Minimal fluid in the fissures and small bilateral pleural effusions. The heart is normal in size. No confluent airspace disease. No pneumothorax. No acute osseous abnormalities. IMPRESSION: 1. Chronic interstitial lung disease. 2. Slight increase in interstitial opacities from prior exam may represent progression of chronic lung disease versus superimposed pulmonary edema or atypical infection. 3. Minimal fluid in the fissures with small pleural effusions. Electronically Signed   By: Keith Rake M.D.   On: 08/05/2019 02:45      IMPRESSION AND PLAN:   1. Acute on chronic combined systolic and diastolic CHF with subsequent mild acute hypoxic respiratory failure. -The patient will be admitted to the progressive unit bed. -We will diurese with IV Lasix and continue Aldactone. -We will follow serial troponin I's. -Strict I's and O's and daily weights. -We will obtain 2D echo and a cardiology consultation. -I notified Dr. Humphrey Rolls about the patient. -O2 protocol will be followed.  2. Paroxysmal atrial fibrillation. -Continue amiodarone and aspirin.  3. Hypertension. -We will continue Toprol-XL and Diovan.  4. Dyslipidemia. -We will continue statin therapy.  5. Rheumatoid arthritis. -We will continue Plaquenil.  6. COPD without exacerbation. -We will hold off his Symbicort we will place on as needed duo nebs.  7. DVT prophylaxis. -Subcutaneous Lovenox.   All the records are reviewed and case discussed with ED provider. The plan of care was discussed in details with the patient (and family). I answered all questions. The patient agreed to proceed with the above mentioned plan. Further management will depend upon hospital course.   CODE STATUS: Full code  Status is: Inpatient  Remains inpatient appropriate because:Ongoing diagnostic testing needed not appropriate for outpatient work up, Unsafe d/c plan, IV treatments  appropriate due to intensity of illness or inability to take PO and Inpatient level of care appropriate due to severity of illness   Dispo: The patient is from: Home              Anticipated d/c is to: Home              Anticipated d/c date is: 2 days              Patient currently is not medically stable to d/c.   TOTAL TIME TAKING CARE OF THIS PATIENT: 55 minutes.    Christel Mormon M.D on 08/05/2019 at 4:23 AM  [facility]   From 7 PM-7 AM, contact night-coverage [email or web address]  CC: Primary care physician; Steele Sizer, MD   Note: This dictation was prepared with Dragon dictation along with smaller phrase technology. Any transcriptional typo errors that result from this process are unintentional.

## 2019-08-05 NOTE — ED Notes (Addendum)
Pt states chest pain and SOB for several weeks. Pt states that it has been getting worse. Pt states swelling to the legs, but when elevated, they decrease. Pt placed on cardiac, bp and pulse ox monitoring

## 2019-08-05 NOTE — Progress Notes (Addendum)
Pt was admitted on the floor with no signs of distress. Pt alert x 4. VSS. Red[place] at 39 %. U/o is white clear in color MD Mansy aware. Pt requested to do the EMMI video after breakfast as so with the educational hand off for CHF. Will continue to monitor.

## 2019-08-05 NOTE — ED Notes (Signed)
Pt taken to floor by Gershon Mussel, RN

## 2019-08-05 NOTE — ED Notes (Signed)
Pt placed on 2LPM via [place] as per provider. Provider aware pt is breathing in the 30s.

## 2019-08-05 NOTE — Progress Notes (Signed)
PROGRESS NOTE   Jason Fitzgerald  KZL:[id number]    DOB: July 06, 1950    DOA: 08/05/2019  PCP: Steele Sizer, MD   I have briefly reviewed patients previous medical [facility] in [facility].  Chief Complaint  Patient presents with   Chest Pain   Shortness of Breath    Brief Narrative:  69 year old male, lives alone, independent, PMH of chronic combined systolic and diastolic CHF, LVEF has since normalized, PAF-on amiodarone but not on AC, HTN, HLD, RA, COPD not on home oxygen, pulmonary fibrosis, GERD, and prior cocaine abuse, presented to ED with complaints of progressively worsening dyspnea, orthopnea, cough with clear sputum, leg edema and weight gain.  Admitted for acute on chronic diastolic CHF.  Cardiology consulted.  Diuresing well with IV Lasix.   Assessment & Plan:  Active Problems:   Acute CHF (congestive heart failure) (HCC)   Acute on chronic diastolic CHF: As per cardiology input, most recent echo from 03/2019 showed LVEF 50-55% and grade 1 diastolic dysfunction.  Repeat 2D echo has been requested.  Started on IV Lasix 60 mg twice daily, Aldactone increased from 12.5 to 25 mg daily, continued Toprol-XL 50 mg daily and Avapro 75 mg daily.  -4.7 L thus far but still significantly volume overloaded.  Discussed with cardiology at bedside.  I do not see where the patient became hypoxic.  Paroxysmal atrial fibrillation: Currently in sinus rhythm on telemetry.  Due to progressive pulmonary fibrosis, cardiology has discontinued amiodarone, discontinued aspirin and resumed Xarelto.  Continue Toprol-XL.  Essential hypertension: Continue Toprol-XL and Avapro.  Controlled.  Dyslipidemia: Continue statins.  Rheumatoid arthritis: No acute flare.  Continue Plaquenil and chronic prednisone.  COPD without exacerbation: No clinical bronchospasm.  As needed duo nebs.  Pulmonary fibrosis: Patient follows with Dr. Raul Del.  Reportedly recently saw his pulmonologist and was determined to  have worsening pulmonary fibrosis.  Amiodarone discontinued.  Anemia of chronic disease: Stable.  Body mass index is 31.51 kg/m.   DVT prophylaxis: Xarelto started 7/3 Code Status: Full Family Communication: None at bedside Disposition:  Status is: Inpatient  Remains inpatient appropriate because:IV treatments appropriate due to intensity of illness or inability to take PO   Dispo: The patient is from: Home              Anticipated d/c is to: Home              Anticipated d/c date is: 3 days              Patient currently is not medically stable to d/c.        Consultants:   Cardiology  Procedures:   None  Antimicrobials:   None   Subjective:  Seems somewhat unreliable regarding what medication he takes.  Cannot recollect if he takes diuretics.  Lives alone, ambulates without assistance.  Not on home oxygen and does not smoke.  Denies cocaine or other drug abuse.  States feels much better compared to admission.  Dyspnea improved but not completely resolved.  No chest pain.  Leg swelling is improving.  Uses "tiny" amount of salt in food.  Objective:   Vitals:   08/05/19 0515 08/05/19 0516 08/05/19 0714 08/05/19 0900  BP:  137/87 (!) 148/76   Pulse:  82 78 82  Resp:   18   Temp:  98.1 F (36.7 C) 98.9 F (37.2 C)   TempSrc:  Oral Oral   SpO2:  100% 100%   Weight: 96.8 kg  Height: 5\' 9"  (1.753 m)       General exam: Middle-age male, moderately built and overweight sitting up comfortably in bed without distress. Respiratory system: Few fine bibasilar crackles but otherwise clear to auscultation Cardiovascular system: S1 & S2 heard, RRR.  JVD +.  No murmurs rubs or gallops.  1+ pitting bilateral leg edema.  Telemetry personally reviewed: Sinus rhythm.   Gastrointestinal system: Abdomen is nondistended, soft and nontender. No organomegaly or masses felt. Normal bowel sounds heard. Central nervous system: Alert and oriented. No focal neurological  deficits. Extremities: Symmetric 5 x 5 power. Skin: No rashes, lesions or ulcers Psychiatry: Judgement and insight appear normal. Mood & affect appropriate.     Data Reviewed:   I have personally reviewed following labs and imaging studies   CBC: Recent Labs  Lab 08/05/19 0152  WBC 6.6  HGB 10.9*  HCT 33.7*  MCV 82.2  PLT 505    Basic Metabolic Panel: Recent Labs  Lab 08/05/19 0152  NA 142  K 3.5  CL 105  CO2 27  GLUCOSE 140*  BUN 17  CREATININE 0.71  CALCIUM 8.4*    Liver Function Tests: No results for input(s): AST, ALT, ALKPHOS, BILITOT, PROT, ALBUMIN in the last 168 hours.  CBG: No results for input(s): GLUCAP in the last 168 hours.  Microbiology Studies:   Recent Results (from the past 240 hour(s))  SARS Coronavirus 2 by RT PCR (hospital order, performed in [facility] hospital lab) Nasopharyngeal Nasopharyngeal Swab     Status: None   Collection Time: 08/05/19  3:04 AM   Specimen: Nasopharyngeal Swab  Result Value Ref Range Status   SARS Coronavirus 2 NEGATIVE NEGATIVE Final    Comment: (NOTE) SARS-CoV-2 target nucleic acids are NOT DETECTED.  The SARS-CoV-2 RNA is generally detectable in upper and lower respiratory specimens during the acute phase of infection. The lowest concentration of SARS-CoV-2 viral copies this assay can detect is 250 copies / mL. A negative result does not preclude SARS-CoV-2 infection and should not be used as the sole basis for treatment or other patient management decisions.  A negative result may occur with improper specimen collection / handling, submission of specimen other than nasopharyngeal swab, presence of viral mutation(s) within the areas targeted by this assay, and inadequate number of viral copies (<250 copies / mL). A negative result must be combined with clinical observations, patient history, and epidemiological information.  Fact Sheet for Patients:   StrictlyIdeas.no  Fact  Sheet for Healthcare Providers: BankingDealers.co.za  This test is not yet approved or  cleared by the [place] FDA and has been authorized for detection and/or diagnosis of SARS-CoV-2 by FDA under an Emergency Use Authorization (EUA).  This EUA will remain in effect (meaning this test can be used) for the duration of the COVID-19 declaration under Section 564(b)(1) of the Act, 21 U.S.C. section 360bbb-3(b)(1), unless the authorization is terminated or revoked sooner.  Performed at [facility], [address] [place], [place] [address]      Radiology Studies:  DG Chest 2 View  Result Date: 08/05/2019 CLINICAL DATA:  Chest pain and shortness of breath, progressive over the past 2 weeks. EXAM: CHEST - 2 VIEW COMPARISON:  Radiograph 02/03/2018.  Chest CT 12/17/2018 FINDINGS: Chronic interstitial lung disease with reticulation and honeycombing in a basilar predominant distribution. There has been slight increase from January 2020 radiograph. Minimal fluid in the fissures and small bilateral pleural effusions. The heart is normal in size. No confluent  airspace disease. No pneumothorax. No acute osseous abnormalities. IMPRESSION: 1. Chronic interstitial lung disease. 2. Slight increase in interstitial opacities from prior exam may represent progression of chronic lung disease versus superimposed pulmonary edema or atypical infection. 3. Minimal fluid in the fissures with small pleural effusions. Electronically Signed   By: Keith Rake M.D.   On: 08/05/2019 02:45     Scheduled Meds:    atorvastatin  10 mg Oral Daily   furosemide  60 mg Intravenous Q12H   hydroxychloroquine  200 mg Oral Daily   irbesartan  75 mg Oral Daily   metoprolol succinate  50 mg Oral Daily   predniSONE  10 mg Oral Daily   rivaroxaban  20 mg Oral Q supper   sodium chloride flush  3 mL Intravenous Q12H   [START ON 08/06/2019] spironolactone  25 mg Oral Daily   thiamine   100 mg Oral Daily    Continuous Infusions:    sodium chloride       LOS: 0 days     Vernell Leep, MD, [place], [facility]. [facility]    To contact the attending provider between 7A-7P or the covering provider during after hours 7P-7A, please log into the web site [email or web address] and access using universal [place] password for that web site. If you do not have the password, please call the hospital operator.  08/05/2019, 11:10 AM

## 2019-08-05 NOTE — ED Triage Notes (Signed)
Pt presents to ER from home with complaints of chest pain and shortness of breath that have been increasing for past 2 weeks, reports tonight he was sitting in recliner and shortness of breath increased, pt talks in complete sentences. Mild respiratory distress noted.

## 2019-08-06 DIAGNOSIS — E876 Hypokalemia: Secondary | ICD-10-CM

## 2019-08-06 DIAGNOSIS — I5033 Acute on chronic diastolic (congestive) heart failure: Secondary | ICD-10-CM

## 2019-08-06 LAB — BASIC METABOLIC PANEL
Anion gap: 10 (ref 5–15)
BUN: 21 mg/dL (ref 8–23)
CO2: 32 mmol/L (ref 22–32)
Calcium: 8.3 mg/dL — ABNORMAL LOW (ref 8.9–10.3)
Chloride: 99 mmol/L (ref 98–111)
Creatinine, Ser: 0.84 mg/dL (ref 0.61–1.24)
GFR calc Af Amer: >60 mL/min (ref >60)
GFR calc non Af Amer: >60 mL/min (ref >60)
Glucose, Bld: 112 mg/dL — ABNORMAL HIGH (ref 70–99)
Potassium: 3.4 mmol/L — ABNORMAL LOW (ref 3.5–5.1)
Sodium: 141 mmol/L (ref 135–145)

## 2019-08-06 MED ORDER — POTASSIUM CHLORIDE CRYS ER 20 MEQ PO TBCR
40.0000 meq | EXTENDED_RELEASE_TABLET | Freq: Once | ORAL | Status: AC
  Administered 2019-08-06: 40 meq via ORAL
  Filled 2019-08-06: qty 2

## 2019-08-06 NOTE — Progress Notes (Signed)
SUBJECTIVE: Patient resting comfortably.  States that his breathing has continued to improve.  Feels that his legs are still very swollen, but on assessment they have improved.  Denies chest pain.   Vitals:   08/05/19 1602 08/05/19 1937 08/06/19 0516 08/06/19 0717  BP: 112/69 133/76 129/83 123/74  Pulse: 70 67 69 66  Resp: 18   18  Temp: 98.3 F (36.8 C) 98.4 F (36.9 C) 97.9 F (36.6 C) 98.6 F (37 C)  TempSrc:      SpO2: 99% 99% 99% 100%  Weight:   88.4 kg   Height:        Intake/Output Summary (Last 24 hours) at 08/06/2019 1053 Last data filed at 08/06/2019 1000 Gross per 24 hour  Intake 1200 ml  Output 4500 ml  Net -3300 ml    LABS: Basic Metabolic Panel: Recent Labs    08/05/19 0152 08/06/19 0613  NA 142 141  K 3.5 3.4*  CL 105 99  CO2 27 32  GLUCOSE 140* 112*  BUN 17 21  CREATININE 0.71 0.84  CALCIUM 8.4* 8.3*   Liver Function Tests: No results for input(s): AST, ALT, ALKPHOS, BILITOT, PROT, ALBUMIN in the last 72 hours. No results for input(s): LIPASE, AMYLASE in the last 72 hours. CBC: Recent Labs    08/05/19 0152  WBC 6.6  HGB 10.9*  HCT 33.7*  MCV 82.2  PLT 274   Cardiac Enzymes: No results for input(s): CKTOTAL, CKMB, CKMBINDEX, TROPONINI in the last 72 hours. BNP: Invalid input(s): POCBNP D-Dimer: No results for input(s): DDIMER in the last 72 hours. Hemoglobin A1C: No results for input(s): HGBA1C in the last 72 hours. Fasting Lipid Panel: No results for input(s): CHOL, HDL, LDLCALC, TRIG, CHOLHDL, LDLDIRECT in the last 72 hours. Thyroid Function Tests: No results for input(s): TSH, T4TOTAL, T3FREE, THYROIDAB in the last 72 hours.  Invalid input(s): FREET3 Anemia Panel: No results for input(s): VITAMINB12, FOLATE, FERRITIN, TIBC, IRON, RETICCTPCT in the last 72 hours.   PHYSICAL EXAM General: Well developed, well nourished, in no acute distress HEENT:  Normocephalic and atramatic Neck:  No JVD.  Lungs: Clear bilaterally to  auscultation and percussion. Heart: HRRR . Normal S1 and S2 without gallops or murmurs.  Abdomen: Bowel sounds are positive, abdomen soft and non-tender  Msk:  Back normal, normal gait. Normal strength and tone for age. Extremities: No clubbing, cyanosis. +2/3 pitting pedal edema Neuro: Alert and oriented X 3. Psych:  Good affect, responds appropriately  TELEMETRY: NSR. 64/bpm  ASSESSMENT AND PLAN: 69 year old male presenting to the emergency department with worsening dyspnea, orthopnea, and bilateral extremity edema.  Patient is having a HFpEF exacerbation.  Net negative 8L. Please continue IV diuresis with Lasix with plan to transition to po tomorrow and continue Aldactone 25 mg daily.  HR and BP stable, please continue metoprolol succinate 50 mg and irbesartan 75mg .  Patient remains in NSR, please continue Xarelto for PAF.  We will continue to follow.  Active Problems:   Acute CHF (congestive heart failure) ([place])    Adaline Sill, NP-C 08/06/2019 10:53 AM

## 2019-08-06 NOTE — Progress Notes (Signed)
PROGRESS NOTE   Jason Fitzgerald  TKZ:[id number]    DOB: 1950/04/18    DOA: 08/05/2019  PCP: Steele Sizer, MD   I have briefly reviewed patients previous medical records in [facility].  Chief Complaint  Patient presents with  . Chest Pain  . Shortness of Breath    Brief Narrative:  69 year old male, lives alone, independent, PMH of chronic combined systolic and diastolic CHF, LVEF has since normalized, PAF-on amiodarone but not on AC, HTN, HLD, RA, COPD not on home oxygen, pulmonary fibrosis, GERD, and prior cocaine abuse, presented to ED with complaints of progressively worsening dyspnea, orthopnea, cough with clear sputum, leg edema and weight gain.  Admitted for acute on chronic diastolic CHF.  Cardiology consulted.  Diuresing well with IV Lasix.  Slowly improving.   Assessment & Plan:  Active Problems:   Acute CHF (congestive heart failure) (HCC)   Acute on chronic diastolic CHF: As per cardiology input, most recent echo from 03/2019 showed LVEF 50-55% and grade 1 diastolic dysfunction.  Repeat 2D echo has been requested.  Started on IV Lasix 60 mg twice daily, Aldactone increased from 12.5 to 25 mg daily, continued Toprol-XL 50 mg daily and Avapro 75 mg daily.  - 8 L thus far but still remains volume overloaded.  Weight down from 215 pounds on admission to 194 pounds. I do not see where the patient became hypoxic.  Cardiology follow-up appreciated.  Continue IV Lasix for additional 24 hours and then transition to oral Lasix at discharge.  Hypokalemia: Secondary to diuresis.  Replace and follow.  Paroxysmal atrial fibrillation: Currently remains in sinus rhythm on telemetry.  Due to progressive pulmonary fibrosis, cardiology has discontinued amiodarone, discontinued aspirin and resumed Xarelto.  Continue Toprol-XL.  Essential hypertension: Continue Toprol-XL and Avapro.  Controlled.  Dyslipidemia: Continue statins.  Rheumatoid arthritis: No acute flare.  Continue Plaquenil  and chronic prednisone.  COPD without exacerbation: No clinical bronchospasm.  As needed duo nebs.  Pulmonary fibrosis: Patient follows with Dr. Raul Del.  Reportedly recently saw his pulmonologist and was determined to have worsening pulmonary fibrosis.  Amiodarone discontinued.  Anemia of chronic disease: Stable.  Body mass index is 28.77 kg/m.   DVT prophylaxis: Xarelto started 7/3 Code Status: Full Family Communication: None at bedside Disposition:  Status is: Inpatient  Remains inpatient appropriate because:IV treatments appropriate due to intensity of illness or inability to take PO   Dispo: The patient is from: Home              Anticipated d/c is to: Home              Anticipated d/c date is: 08/07/2019              Patient currently is not medically stable to d/c.        Consultants:   Cardiology  Procedures:   None  Antimicrobials:   None   Subjective:  Continues to feel better.  Dyspnea is progressively improved and breathing is back to baseline.  Still has ankle swelling but better compared to admission.  States that he indulges in spam and corn chips high in salt and plans to cut back on them.  As per RN, no acute issues reported.  Objective:   Vitals:   08/05/19 1937 08/06/19 0516 08/06/19 0717 08/06/19 1142  BP: 133/76 129/83 123/74 105/65  Pulse: 67 69 66 66  Resp:   18 (!) 21  Temp: 98.4 F (36.9 C) 97.9 F (36.6 C)  98.6 F (37 C) 98.2 F (36.8 C)  TempSrc:      SpO2: 99% 99% 100% 99%  Weight:  88.4 kg    Height:        General exam: Middle-age male, moderately built and overweight sitting up comfortably in bed without distress. Respiratory system: Clear to auscultation.  No increased work of breathing. Cardiovascular system: S1 & S2 heard, RRR.  No JVD.  No murmurs rubs or gallops.  Leg edema improved, now with ankle edema.  Telemetry personally reviewed: Sinus rhythm with occasional PVCs.   Gastrointestinal system: Abdomen is  nondistended, soft and nontender. No organomegaly or masses felt. Normal bowel sounds heard. Central nervous system: Alert and oriented. No focal neurological deficits. Extremities: Symmetric 5 x 5 power. Skin: No rashes, lesions or ulcers Psychiatry: Judgement and insight appear normal. Mood & affect appropriate.     Data Reviewed:   I have personally reviewed following labs and imaging studies   CBC: Recent Labs  Lab 08/05/19 0152  WBC 6.6  HGB 10.9*  HCT 33.7*  MCV 82.2  PLT 563    Basic Metabolic Panel: Recent Labs  Lab 08/05/19 0152 08/06/19 0613  NA 142 141  K 3.5 3.4*  CL 105 99  CO2 27 32  GLUCOSE 140* 112*  BUN 17 21  CREATININE 0.71 0.84  CALCIUM 8.4* 8.3*    Liver Function Tests: No results for input(s): AST, ALT, ALKPHOS, BILITOT, PROT, ALBUMIN in the last 168 hours.  CBG: No results for input(s): GLUCAP in the last 168 hours.  Microbiology Studies:   Recent Results (from the past 240 hour(s))  SARS Coronavirus 2 by RT PCR (hospital order, performed in [facility] hospital lab) Nasopharyngeal Nasopharyngeal Swab     Status: None   Collection Time: 08/05/19  3:04 AM   Specimen: Nasopharyngeal Swab  Result Value Ref Range Status   SARS Coronavirus 2 NEGATIVE NEGATIVE Final    Comment: (NOTE) SARS-CoV-2 target nucleic acids are NOT DETECTED.  The SARS-CoV-2 RNA is generally detectable in upper and lower respiratory specimens during the acute phase of infection. The lowest concentration of SARS-CoV-2 viral copies this assay can detect is 250 copies / mL. A negative result does not preclude SARS-CoV-2 infection and should not be used as the sole basis for treatment or other patient management decisions.  A negative result may occur with improper specimen collection / handling, submission of specimen other than nasopharyngeal swab, presence of viral mutation(s) within the areas targeted by this assay, and inadequate number of viral copies (<250  copies / mL). A negative result must be combined with clinical observations, patient history, and epidemiological information.  Fact Sheet for Patients:   StrictlyIdeas.no  Fact Sheet for Healthcare Providers: BankingDealers.co.za  This test is not yet approved or  cleared by the [place] FDA and has been authorized for detection and/or diagnosis of SARS-CoV-2 by FDA under an Emergency Use Authorization (EUA).  This EUA will remain in effect (meaning this test can be used) for the duration of the COVID-19 declaration under Section 564(b)(1) of the Act, 21 U.S.C. section 360bbb-3(b)(1), unless the authorization is terminated or revoked sooner.  Performed at [facility], [address] [place], [place] [address]      Radiology Studies:  DG Chest 2 View  Result Date: 08/05/2019 CLINICAL DATA:  Chest pain and shortness of breath, progressive over the past 2 weeks. EXAM: CHEST - 2 VIEW COMPARISON:  Radiograph 02/03/2018.  Chest CT 12/17/2018 FINDINGS:  Chronic interstitial lung disease with reticulation and honeycombing in a basilar predominant distribution. There has been slight increase from January 2020 radiograph. Minimal fluid in the fissures and small bilateral pleural effusions. The heart is normal in size. No confluent airspace disease. No pneumothorax. No acute osseous abnormalities. IMPRESSION: 1. Chronic interstitial lung disease. 2. Slight increase in interstitial opacities from prior exam may represent progression of chronic lung disease versus superimposed pulmonary edema or atypical infection. 3. Minimal fluid in the fissures with small pleural effusions. Electronically Signed   By: Keith Rake M.D.   On: 08/05/2019 02:45     Scheduled Meds:   . atorvastatin  10 mg Oral Daily  . furosemide  60 mg Intravenous Q12H  . hydroxychloroquine  200 mg Oral Daily  . irbesartan  75 mg Oral Daily  . metoprolol  succinate  50 mg Oral Daily  . predniSONE  10 mg Oral Daily  . rivaroxaban  20 mg Oral Q supper  . sodium chloride flush  3 mL Intravenous Q12H  . spironolactone  25 mg Oral Daily  . thiamine  100 mg Oral Daily    Continuous Infusions:   . sodium chloride       LOS: 1 day     Vernell Leep, MD, [place], [facility]. [facility]    To contact the attending provider between 7A-7P or the covering provider during after hours 7P-7A, please log into the web site [email or web address] and access using universal [place] password for that web site. If you do not have the password, please call the hospital operator.  08/06/2019, 12:13 PM

## 2019-08-07 DIAGNOSIS — I5041 Acute combined systolic (congestive) and diastolic (congestive) heart failure: Secondary | ICD-10-CM

## 2019-08-07 LAB — ECHOCARDIOGRAM COMPLETE
Height: 69 in
Weight: 3414.4 oz

## 2019-08-07 LAB — BASIC METABOLIC PANEL
Anion gap: 9 (ref 5–15)
BUN: 25 mg/dL — ABNORMAL HIGH (ref 8–23)
CO2: 32 mmol/L (ref 22–32)
Calcium: 8.4 mg/dL — ABNORMAL LOW (ref 8.9–10.3)
Chloride: 97 mmol/L — ABNORMAL LOW (ref 98–111)
Creatinine, Ser: 0.82 mg/dL (ref 0.61–1.24)
GFR calc Af Amer: >60 mL/min (ref >60)
GFR calc non Af Amer: >60 mL/min (ref >60)
Glucose, Bld: 124 mg/dL — ABNORMAL HIGH (ref 70–99)
Potassium: 3.5 mmol/L (ref 3.5–5.1)
Sodium: 138 mmol/L (ref 135–145)

## 2019-08-07 MED ORDER — FUROSEMIDE 40 MG PO TABS: 40.0000 mg | ORAL_TABLET | Freq: Every day | ORAL | Status: DC

## 2019-08-07 MED ORDER — SPIRONOLACTONE 25 MG PO TABS: 25.0000 mg | ORAL_TABLET | Freq: Every day | ORAL | 0 refills | Status: DC

## 2019-08-07 MED ORDER — RIVAROXABAN 20 MG PO TABS: 20.0000 mg | ORAL_TABLET | Freq: Every day | ORAL | 0 refills | Status: DC

## 2019-08-07 MED ORDER — POTASSIUM CHLORIDE CRYS ER 20 MEQ PO TBCR
40.0000 meq | EXTENDED_RELEASE_TABLET | Freq: Once | ORAL | Status: AC
  Administered 2019-08-07: 40 meq via ORAL
  Filled 2019-08-07: qty 2

## 2019-08-07 MED ORDER — FUROSEMIDE 40 MG PO TABS: 40.0000 mg | ORAL_TABLET | Freq: Every day | ORAL | 0 refills | Status: DC

## 2019-08-07 NOTE — Progress Notes (Signed)
SUBJECTIVE: Patient doing well. Continues to state his breathing has improved as well as his BLE edema. Only complaints is soreness I his legs.   Vitals:   08/06/19 1142 08/06/19 1605 08/06/19 1947 08/07/19 0748  BP: 105/65 (!) 109/53 103/68 123/76  Pulse: 66 65 69 69  Resp: (!) 21 17 20 18   Temp: 98.2 F (36.8 C) 98.4 F (36.9 C) 98.7 F (37.1 C) (!) 97.3 F (36.3 C)  TempSrc:   Oral   SpO2: 99% 99% 97% 100%  Weight:      Height:        Intake/Output Summary (Last 24 hours) at 08/07/2019 1021 Last data filed at 08/07/2019 1008 Gross per 24 hour  Intake 123 ml  Output 2700 ml  Net -2577 ml    LABS: Basic Metabolic Panel: Recent Labs    08/06/19 0613 08/07/19 0527  NA 141 138  K 3.4* 3.5  CL 99 97*  CO2 32 32  GLUCOSE 112* 124*  BUN 21 25*  CREATININE 0.84 0.82  CALCIUM 8.3* 8.4*   Liver Function Tests: No results for input(s): AST, ALT, ALKPHOS, BILITOT, PROT, ALBUMIN in the last 72 hours. No results for input(s): LIPASE, AMYLASE in the last 72 hours. CBC: Recent Labs    08/05/19 0152  WBC 6.6  HGB 10.9*  HCT 33.7*  MCV 82.2  PLT 274   Cardiac Enzymes: No results for input(s): CKTOTAL, CKMB, CKMBINDEX, TROPONINI in the last 72 hours. BNP: Invalid input(s): POCBNP D-Dimer: No results for input(s): DDIMER in the last 72 hours. Hemoglobin A1C: No results for input(s): HGBA1C in the last 72 hours. Fasting Lipid Panel: No results for input(s): CHOL, HDL, LDLCALC, TRIG, CHOLHDL, LDLDIRECT in the last 72 hours. Thyroid Function Tests: No results for input(s): TSH, T4TOTAL, T3FREE, THYROIDAB in the last 72 hours.  Invalid input(s): FREET3 Anemia Panel: No results for input(s): VITAMINB12, FOLATE, FERRITIN, TIBC, IRON, RETICCTPCT in the last 72 hours.   PHYSICAL EXAM General: Well developed, well nourished, in no acute distress HEENT:  Normocephalic and atramatic Neck:  No JVD.  Lungs: Clear bilaterally to auscultation and percussion. Heart: HRRR .  Normal S1 and S2 without gallops. Frequent PVC's and couplets Abdomen: Bowel sounds are positive, abdomen soft and non-tender  Msk:  Back normal, normal gait. Normal strength and tone for age. Extremities: No clubbing, cyanosis. +1 pitting BLE edema   Neuro: Alert and oriented X 3. Psych:  Good affect, responds appropriately  TELEMETRY: NSR with frequent PVCs and couplets  ASSESSMENT AND PLAN: 69 year old male presenting to the emergency department with worsening dyspnea, orthopnea, and bilateral extremity edema d/t HFpEF exacerbation.  Now net negative 10L with much improved respiratory status and edema.Patient received one last dose of IV lasix this morning and transition to po on discharge and continue Aldactone 25 mg daily. HR and BP stable, please continue metoprolol succinate 50 mg and irbesartan 75mg . Patient remains in NSR, please continue Xarelto for PAF. From a cardiac point of view, the patient is stable for discharge. Patient has an appt at our office on 7/8 at 10AM.  Active Problems:   Acute CHF (congestive heart failure) ([place])    Adaline Sill, NP-C 08/07/2019 10:21 AM

## 2019-08-07 NOTE — Discharge Instructions (Signed)

## 2019-08-07 NOTE — Discharge Summary (Signed)
Physician Discharge Summary  Jason Fitzgerald EQA:[id number] DOB: November 13, 1950  PCP: Steele Sizer, MD  Admitted from: Home Discharged to: Home  Admit date: 08/05/2019 Discharge date: 08/07/2019  Recommendations for Outpatient Follow-up:    Follow-up Information    [facility] Follow up on 09/04/2019.   Specialty: Cardiology Why: at 2:00pm. Enter through the [place] entrance Contact information: [address] [place] [phone]       Steele Sizer, MD. Schedule an appointment as soon as possible for a visit in 1 week(s).   Specialty: Family Medicine Why: To be seen with repeat labs (CBC & BMP). Contact information: [address] [place] [address] [phone]                Home Health: None Equipment/Devices: None  Discharge Condition: Improved and stable CODE STATUS: Full Diet recommendation: Heart healthy diet  Discharge Diagnoses:  Active Problems:   Acute CHF (congestive heart failure) (HCC)   Brief Summary: 69 year old male, lives alone, independent, PMH of chronic combined systolic and diastolic CHF, LVEF has since normalized, PAF-on amiodarone but not on AC, HTN, HLD, RA, COPD not on home oxygen, pulmonary fibrosis, GERD, and prior cocaine abuse, presented to ED with complaints of progressively worsening dyspnea, orthopnea, cough with clear sputum, leg edema and weight gain.  Admitted for acute on chronic diastolic CHF.  Cardiology consulted.    Diuresed well with Lasix IV.   Assessment & Plan:   Acute on chronic combined systolic diastolic CHF: As per cardiology input, most recent echo from 03/2019 showed LVEF 50-55% and grade 1 diastolic dysfunction.  Repeat 2D echo this admission showed LVEF 45-50%, LV global hypokinesis and grade 2 diastolic dysfunction.  Started on IV Lasix 60 mg twice daily, Aldactone increased from 12.5 to 25 mg daily,  continued Toprol-XL 50 mg daily and Avapro 75 mg daily (substituted for Diovan).  - 10.5 L thus far this admission.  Weight down from 215 pounds on admission to 194 pounds, weight not checked today. I do not see where the patient became hypoxic.    Discussed with cardiology, they are follow-up appreciated.  Have transitioned patient to Lasix oral 40 mg daily.  They have cleared him for DC on Lasix 40 mg daily, Aldactone 25 mg daily, Toprol-XL 50 mg daily, resume prior home dose of Diovan (was getting irbesartan 75 mg daily in the hospital instead) and continue Xarelto for PAF.  They have arranged close outpatient follow-up as above.  Hypokalemia: Secondary to diuresis.  Due to low normal potassium despite replacement, continue prior home dose of low-dose potassium 20 mEq daily despite his Diovan and Aldactone but needs close BMP follow-up as outpatient.  Paroxysmal atrial fibrillation: Currently remains in sinus rhythm on telemetry.  Due to progressive pulmonary fibrosis, cardiology has discontinued amiodarone, discontinued aspirin and resumed Xarelto.  Continue Toprol-XL.  Requested TOC assistance for Xarelto discount card and for insurance benefits check as far as his Xarelto.  Essential hypertension: Continue Toprol-XL and Diovan.  Controlled.  Dyslipidemia: Continue statins.  Rheumatoid arthritis: No acute flare.    Pharmacy did a home med rec review today.  Patient has not taken Plaquenil in over 30 days-discontinued, is on lower dose of prednisone 2.5 mg daily, continue and also takes [place], continue.  May need to follow-up with outpatient rheumatology and pulmonology to see if some of his lung pathology is related to rheumatoid arthritis.  COPD without exacerbation:  No clinical bronchospasm.  Continue prior home medications  Pulmonary fibrosis: Patient follows with Dr. Raul Del.  Reportedly recently saw his pulmonologist and was determined to have worsening pulmonary fibrosis.  Amiodarone  discontinued.  Anemia of chronic disease: Stable.  Body mass index is 28.77 kg/m.    Consultants:   Cardiology  Procedures:   None  Discharge Instructions  Discharge Instructions    (HEART FAILURE PATIENTS) Call MD:  Anytime you have any of the following symptoms: 1) 3 pound weight gain in 24 hours or 5 pounds in 1 week 2) shortness of breath, with or without a dry hacking cough 3) swelling in the hands, feet or stomach 4) if you have to sleep on extra pillows at night in order to breathe.   Complete by: As directed    Call MD for:  difficulty breathing, headache or visual disturbances   Complete by: As directed    Call MD for:  extreme fatigue   Complete by: As directed    Call MD for:  persistant dizziness or light-headedness   Complete by: As directed    Diet - low sodium heart healthy   Complete by: As directed    Increase activity slowly   Complete by: As directed        Medication List    STOP taking these medications   amiodarone 200 MG tablet Commonly known as: PACERONE   aspirin 325 MG EC tablet     TAKE these medications   atorvastatin 10 MG tablet Commonly known as: LIPITOR Take 1 tablet (10 mg total) by mouth daily.   budesonide-formoterol 160-4.5 MCG/ACT inhaler Commonly known as: SYMBICORT Inhale 1-2 puffs into the lungs daily as needed (respiratory difficulty).   furosemide 40 MG tablet Commonly known as: LASIX Take 1 tablet (40 mg total) by mouth daily. Start taking on: August 08, 2019   leflunomide 10 MG tablet Commonly known as: ARAVA Take 10 mg by mouth daily.   metoprolol succinate 50 MG 24 hr tablet Commonly known as: TOPROL-XL Take 50 mg by mouth daily. Take with or immediately following a meal.   potassium chloride SA 20 MEQ tablet Commonly known as: KLOR-CON Take 1 tablet (20 mEq total) by mouth daily.   predniSONE 5 MG tablet Commonly known as: DELTASONE Take 2.5 mg by mouth daily.   rivaroxaban 20 MG Tabs  tablet Commonly known as: XARELTO Take 1 tablet (20 mg total) by mouth daily with supper.   spironolactone 25 MG tablet Commonly known as: ALDACTONE Take 1 tablet (25 mg total) by mouth daily. What changed: how much to take   thiamine 100 MG tablet Take 1 tablet (100 mg total) by mouth daily. Every other day   valsartan 80 MG tablet Commonly known as: DIOVAN Take 80 mg by mouth daily.      No Known Allergies    Procedures/Studies: DG Chest 2 View  Result Date: 08/05/2019 CLINICAL DATA:  Chest pain and shortness of breath, progressive over the past 2 weeks. EXAM: CHEST - 2 VIEW COMPARISON:  Radiograph 02/03/2018.  Chest CT 12/17/2018 FINDINGS: Chronic interstitial lung disease with reticulation and honeycombing in a basilar predominant distribution. There has been slight increase from January 2020 radiograph. Minimal fluid in the fissures and small bilateral pleural effusions. The heart is normal in size. No confluent airspace disease. No pneumothorax. No acute osseous abnormalities. IMPRESSION: 1. Chronic interstitial lung disease. 2. Slight increase in interstitial opacities from prior exam may represent progression of chronic lung disease versus  superimposed pulmonary edema or atypical infection. 3. Minimal fluid in the fissures with small pleural effusions. Electronically Signed   By: Keith Rake M.D.   On: 08/05/2019 02:45   ECHOCARDIOGRAM COMPLETE  Result Date: 08/07/2019    ECHOCARDIOGRAM REPORT   Patient Name:   Jason Fitzgerald Date of Exam: 08/05/2019 Medical Rec #:  [id number]       Height:       69.0 in Accession #:    [id number]      Weight:       213.4 lb Date of Birth:  1950/02/27      BSA:          2.124 m Patient Age:    14 years        BP:           133/61 mmHg Patient Gender: M               HR:           74 bpm. Exam Location:  [facility] Procedure: 2D Echo and Intracardiac Opacification Agent Indications:     CHF I50.31  History:         Patient has prior history of  Echocardiogram examinations, most                  recent 02/04/2018.  Sonographer:     Arville Go RDCS Referring Phys:  [id number] [place] Diagnosing Phys: Neoma Laming MD  Sonographer Comments: Technically difficult study due to poor echo windows. Image acquisition challenging due to respiratory motion. IMPRESSIONS  1. Left ventricular ejection fraction, by estimation, is 45 to 50%. The left ventricle has mildly decreased function. The left ventricle demonstrates global hypokinesis. Left ventricular diastolic parameters are consistent with Grade II diastolic dysfunction (pseudonormalization).  2. Right ventricular systolic function is normal. The right ventricular size is normal.  3. Left atrial size was mildly dilated.  4. Right atrial size was mildly dilated.  5. The mitral valve is normal in structure. Trivial mitral valve regurgitation. No evidence of mitral stenosis.  6. The aortic valve is normal in structure. Aortic valve regurgitation is not visualized. Mild aortic valve sclerosis is present, with no evidence of aortic valve stenosis.  7. The inferior vena cava is normal in size with greater than 50% respiratory variability, suggesting right atrial pressure of 3 mmHg. FINDINGS  Left Ventricle: Left ventricular ejection fraction, by estimation, is 45 to 50%. The left ventricle has mildly decreased function. The left ventricle demonstrates global hypokinesis. Definity contrast agent was given IV to delineate the left ventricular  endocardial borders. The left ventricular internal cavity size was normal in size. There is no left ventricular hypertrophy. Left ventricular diastolic parameters are consistent with Grade II diastolic dysfunction (pseudonormalization). Right Ventricle: The right ventricular size is normal. No increase in right ventricular wall thickness. Right ventricular systolic function is normal. Left Atrium: Left atrial size was mildly dilated. Right Atrium: Right atrial size was  mildly dilated. Pericardium: There is no evidence of pericardial effusion. Mitral Valve: The mitral valve is normal in structure. Normal mobility of the mitral valve leaflets. Trivial mitral valve regurgitation. No evidence of mitral valve stenosis. Tricuspid Valve: The tricuspid valve is normal in structure. Tricuspid valve regurgitation is trivial. No evidence of tricuspid stenosis. Aortic Valve: The aortic valve is normal in structure. Aortic valve regurgitation is not visualized. Mild aortic valve sclerosis is present, with no evidence of aortic valve stenosis. Aortic valve peak gradient measures  5.5 mmHg. Pulmonic Valve: The pulmonic valve was normal in structure. Pulmonic valve regurgitation is trivial. No evidence of pulmonic stenosis. Aorta: The aortic root is normal in size and structure, the aortic root, ascending aorta, aortic arch and descending aorta are all structurally normal, with no evidence of dilitation or obstruction and the aortic root, ascending aorta and aortic arch are  all structurally normal, with no evidence of dilitation or obstruction. Venous: The inferior vena cava is normal in size with greater than 50% respiratory variability, suggesting right atrial pressure of 3 mmHg. IAS/Shunts: No atrial level shunt detected by color flow Doppler.  LEFT VENTRICLE PLAX 2D LVIDd:         5.38 cm      Diastology LVIDs:         4.23 cm      LV e' lateral:   6.09 cm/s LV PW:         1.09 cm      LV E/e' lateral: 10.6 LV IVS:        1.06 cm      LV e' medial:    4.03 cm/s LVOT diam:     1.90 cm      LV E/e' medial:  16.0 LV SV:         48 LV SV Index:   23 LVOT Area:     2.84 cm  LV Volumes (MOD) LV vol d, MOD A4C: 143.0 ml LV vol s, MOD A4C: 72.2 ml LV SV MOD A4C:     143.0 ml RIGHT VENTRICLE RV Basal diam:  3.23 cm LEFT ATRIUM             Index       RIGHT ATRIUM           Index LA diam:        3.60 cm 1.70 cm/m  RA Area:     17.60 cm LA Vol (A2C):   22.7 ml 10.69 ml/m RA Volume:   53.40 ml  25.14  ml/m LA Vol (A4C):   49.5 ml 23.31 ml/m LA Biplane Vol: 34.9 ml 16.43 ml/m  AORTIC VALVE                PULMONIC VALVE AV Area (Vmax): 2.25 cm    PV Vmax:       0.66 m/s AV Vmax:        117.00 cm/s PV Peak grad:  1.8 mmHg AV Peak Grad:   5.5 mmHg LVOT Vmax:      92.70 cm/s LVOT Vmean:     60.000 cm/s LVOT VTI:       0.171 m  AORTA Ao Root diam: 2.90 cm Ao Asc diam:  2.60 cm MITRAL VALVE MV Area (PHT): 3.31 cm    SHUNTS MV Decel Time: 229 msec    Systemic VTI:  0.17 m MV E velocity: 64.30 cm/s  Systemic Diam: 1.90 cm MV A velocity: 54.00 cm/s MV E/A ratio:  1.19 Neoma Laming MD Electronically signed by Neoma Laming MD Signature Date/Time: 08/07/2019/10:31:49 AM    Final       Subjective: Denies dyspnea.  Breathing is back to baseline.  Leg edema has significantly improved.  Reports some soreness in his feet.  As per RN, no acute issues noted.  Discharge Exam:  Vitals:   08/06/19 1605 08/06/19 1947 08/07/19 0748 08/07/19 1145  BP: (!) 109/53 103/68 123/76 (!) 112/57  Pulse: 65 69 69 81  Resp: 17 20 18  18  Temp: 98.4 F (36.9 C) 98.7 F (37.1 C) (!) 97.3 F (36.3 C) 97.9 F (36.6 C)  TempSrc:  Oral  Oral  SpO2: 99% 97% 100% 98%  Weight:      Height:        General exam: Middle-age male, moderately built and overweight sitting up comfortably in bed without distress. Respiratory system: Clear to auscultation.  No increased work of breathing. Cardiovascular system: S1 & S2 heard, RRR.  No JVD.  No murmurs rubs or gallops.  Now has bilateral knee-high TED hoses.  Trace bilateral ankle edema but significantly improved pedal edema compared to admission.  Telemetry personally reviewed: Sinus rhythm, occasional PVCs, 7 beat NSVT last night.   Gastrointestinal system: Abdomen is nondistended, soft and nontender. No organomegaly or masses felt. Normal bowel sounds heard. Central nervous system: Alert and oriented. No focal neurological deficits. Extremities: Symmetric 5 x 5 power. Skin: No  rashes, lesions or ulcers Psychiatry: Judgement and insight appear normal. Mood & affect appropriate.     The results of significant diagnostics from this hospitalization (including imaging, microbiology, ancillary and laboratory) are listed below for reference.     Microbiology: Recent Results (from the past 240 hour(s))  SARS Coronavirus 2 by RT PCR (hospital order, performed in [facility] hospital lab) Nasopharyngeal Nasopharyngeal Swab     Status: None   Collection Time: 08/05/19  3:04 AM   Specimen: Nasopharyngeal Swab  Result Value Ref Range Status   SARS Coronavirus 2 NEGATIVE NEGATIVE Final    Comment: (NOTE) SARS-CoV-2 target nucleic acids are NOT DETECTED.  The SARS-CoV-2 RNA is generally detectable in upper and lower respiratory specimens during the acute phase of infection. The lowest concentration of SARS-CoV-2 viral copies this assay can detect is 250 copies / mL. A negative result does not preclude SARS-CoV-2 infection and should not be used as the sole basis for treatment or other patient management decisions.  A negative result may occur with improper specimen collection / handling, submission of specimen other than nasopharyngeal swab, presence of viral mutation(s) within the areas targeted by this assay, and inadequate number of viral copies (<250 copies / mL). A negative result must be combined with clinical observations, patient history, and epidemiological information.  Fact Sheet for Patients:   StrictlyIdeas.no  Fact Sheet for Healthcare Providers: BankingDealers.co.za  This test is not yet approved or  cleared by the [place] FDA and has been authorized for detection and/or diagnosis of SARS-CoV-2 by FDA under an Emergency Use Authorization (EUA).  This EUA will remain in effect (meaning this test can be used) for the duration of the COVID-19 declaration under Section 564(b)(1) of the Act, 21  U.S.C. section 360bbb-3(b)(1), unless the authorization is terminated or revoked sooner.  Performed at [facility], [place], [place] [address]      Labs: CBC: Recent Labs  Lab 08/05/19 0152  WBC 6.6  HGB 10.9*  HCT 33.7*  MCV 82.2  PLT 856    Basic Metabolic Panel: Recent Labs  Lab 08/05/19 0152 08/06/19 0613 08/07/19 0527  NA 142 141 138  K 3.5 3.4* 3.5  CL 105 99 97*  CO2 27 32 32  GLUCOSE 140* 112* 124*  BUN 17 21 25*  CREATININE 0.71 0.84 0.82  CALCIUM 8.4* 8.3* 8.4*      Time coordinating discharge: 35 minutes  SIGNED:  Vernell Leep, MD, FACP, [facility]. [facility]  To contact the attending provider between 7A-7P or the covering provider during  after hours 7P-7A, please log into the web site [email or web address] and access using universal [place] password for that web site. If you do not have the password, please call the hospital operator.

## 2019-08-07 NOTE — Progress Notes (Signed)
Discussed discharge instructions with patient, including medications and follow up appointments.   Encourage patient to continue to increase activity as tolerated and to use Incentive spirometer 3- 4 times daily while awake

## 2019-08-08 ENCOUNTER — Telehealth: Payer: Self-pay

## 2019-08-08 NOTE — Telephone Encounter (Signed)
Transition Care Management Follow-up Telephone Call  Date of discharge and from where: 08/07/19 [facility]  How have you been since you were released from the hospital? Pt states he is doing okay but c/o 9/10 pain where fluid was drawn from feet.   Any questions or concerns? No   Items Reviewed:  Did the pt receive and understand the discharge instructions provided? Yes   Medications obtained and verified? No   Any new allergies since your discharge? No   Dietary orders reviewed? Yes  Do you have support at home? Yes   Functional Questionnaire: (I = Independent and D = Dependent) ADLs: I  Bathing/Dressing- I  Meal Prep- I  Eating- I  Maintaining continence- I  Transferring/Ambulation- I  Managing Meds- I  Follow up appointments reviewed:   PCP Hospital f/u appt confirmed? No  Front desk to contact patient with available appt due to provider out of office  [facility] f/u appt confirmed? Yes  Scheduled to see cardiology on 08/10/19 @ 10:00.  Are transportation arrangements needed? No   If their condition worsens, is the pt aware to call PCP or go to the Emergency Dept.? Yes  Was the patient provided with contact information for the PCP's office or ED? Yes  Was to pt encouraged to call back with questions or concerns? Yes

## 2019-08-09 ENCOUNTER — Telehealth: Payer: Self-pay | Admitting: Family

## 2019-08-09 NOTE — Telephone Encounter (Signed)
Unable to reach patient regarding his follow up appointment with the [facility] that was made after patient was recently discharged.    Yakov Bergen, NT

## 2019-08-15 ENCOUNTER — Inpatient Hospital Stay: Payer: Medicare Other | Admitting: Family Medicine

## 2019-09-04 ENCOUNTER — Ambulatory Visit: Payer: Medicare Other | Admitting: Family

## 2019-09-04 ENCOUNTER — Telehealth: Payer: Self-pay | Admitting: Family

## 2019-09-04 NOTE — Progress Notes (Deleted)
Patient ID: Jason Fitzgerald, male    DOB: 09-15-1950, 69 y.o.   MRN: [id number]  HPI  Mr Nohr is a 69 y/o male with a history of COPD, pulmonary fibrosis, GERD, previous cocaine use and chronic heart failure.   Echo report from 08/05/19 reviewed and showed an EF of 45-50% along with mild LAE and trivial MR. Echo report from 03/04/17 showed an EF of 25-30% along with trivial AR, mild MR, moderate TR and elevated PA pressure of 46 mm Hg.   Cardiac catheterization done 03/05/17 which showed normal coronaries with severe LV dysfunction and severe pulmonary HTN.  Admitted 08/05/19 due to acute on chronic HF. Cardiology consult obtained. Initially given IV lasix with transition to oral diuretics. Hypokalemia corrected. Discharged after 2 days.   He presents today for a follow-up visit although hasn't been seen since 2019. He presents with a chief complaint of   Past Medical History:  Diagnosis Date  . CHF (congestive heart failure) ([place])   . Chronic cough   . COPD (chronic obstructive pulmonary disease) ([place])   . Elevated liver function tests   . Emphysema of lung ([place])   . Fibrosis, pulmonary, interstitial, diffuse ([place])   . GERD (gastroesophageal reflux disease)   . History of cocaine abuse ([place])   . Mediastinal lymphadenopathy   . Pulmonary fibrosis ([place])   . Right inguinal hernia    Past Surgical History:  Procedure Laterality Date  . COLONOSCOPY WITH PROPOFOL N/A 08/19/2018   Procedure: COLONOSCOPY WITH PROPOFOL;  Surgeon: Virgel Manifold, MD;  Location: [facility] ENDOSCOPY;  Service: Endoscopy;  Laterality: N/A;  . HERNIA REPAIR Right 1973   open  . INGUINAL HERNIA REPAIR Right 11/08/2014   Procedure: LAPAROSCOPIC RIGHT INGUINAL HERNIA REPAIR;  Surgeon: Hubbard Robinson, MD;  Location: [facility] ORS;  Service: General;  Laterality: Right;  . knee arthroscopy Right 1972  . RIGHT/LEFT HEART CATH AND CORONARY ANGIOGRAPHY Right 03/05/2017   Procedure: RIGHT/LEFT HEART CATH AND CORONARY  ANGIOGRAPHY;  Surgeon: Dionisio David, MD;  Location: [facility] CV LAB;  Service: Cardiovascular;  Laterality: Right;   Family History  Problem Relation Age of Onset  . Diabetes Mother   . Cancer Father   . AAA (abdominal aortic aneurysm) Brother    Social History   Tobacco Use  . Smoking status: Never Smoker  . Smokeless tobacco: Never Used  Substance Use Topics  . Alcohol use: Not Currently    Alcohol/week: 0.0 standard drinks    Comment: 2 quarts a week, he quit again 12/2018   No Known Allergies    Review of Systems  Constitutional: Positive for fatigue (minimal). Negative for appetite change.  HENT: Negative for congestion, rhinorrhea and sore throat.   Eyes: Negative.   Respiratory: Positive for cough (when eating or when getting excited). Negative for chest tightness and shortness of breath.   Cardiovascular: Positive for leg swelling (slight swelling in right lower leg). Negative for chest pain and palpitations.  Gastrointestinal: Negative for abdominal distention and abdominal pain.  Endocrine: Negative.   Genitourinary: Negative.   Musculoskeletal: Positive for arthralgias (right shoulder pain). Negative for back pain and neck pain.  Skin: Negative.   Allergic/Immunologic: Negative.   Neurological: Positive for dizziness and numbness (right foot). Negative for light-headedness.  Hematological: Negative for adenopathy. Does not bruise/bleed easily.  Psychiatric/Behavioral: Positive for sleep disturbance (now retired; still having trouble sleeping). Negative for dysphoric mood. The patient is not nervous/anxious.      Physical Exam  Vitals and nursing note reviewed.  Constitutional:      Appearance: He is well-developed.  HENT:     Head: Normocephalic and atraumatic.  Neck:     Vascular: No JVD.  Cardiovascular:     Rate and Rhythm: Normal rate. Rhythm irregularly irregular.  Pulmonary:     Effort: Pulmonary effort is normal.     Breath sounds: No  wheezing or rales.  Abdominal:     General: There is no distension.     Palpations: Abdomen is soft.     Tenderness: There is no abdominal tenderness.  Musculoskeletal:        General: No tenderness.     Cervical back: Normal range of motion and neck supple.  Skin:    General: Skin is warm and dry.  Neurological:     Mental Status: He is alert and oriented to person, place, and time.  Psychiatric:        Behavior: Behavior normal.        Thought Content: Thought content normal.    Assessment & Plan:  1: Chronic heart failure with mildly reduced ejection fraction- - NYHA class II - euvolemic today - weighing daily; Reminded to call for an overnight weight gain of >2 pounds or a weekly weight gain of >5 pounds - not adding salt to his food and he was reminded to closely follow a 2000mg  sodium diet - saw cardiology Humphrey Rolls) ~ 1 month ago and returns to him soon - BNP 08/05/19 was 1110.6  2: HTN- - BP  - saw PCP Ancil Boozer) 06/19/19 - BMP from 08/07/19 was reviewed and showed sodium 138, potassium 3.5, creatinine 0.82 and GFR >60   3: Atrial fibrillation- - currently rate controlled - taking amiodarone, carvedilol, digoxin and rivaroxaban  4: Pulmonary HTN- - using his inhalers - saw pulmonologist Raul Del) 07/20/19   Patient did not bring his medications nor a list. Each medication was verbally reviewed with the patient and he was encouraged to bring the bottles to every visit to confirm accuracy of list.

## 2019-09-04 NOTE — Telephone Encounter (Signed)
Patient did not show for his Heart Failure Clinic appointment on 09/04/19. Will attempt to reschedule.  

## 2019-09-06 DIAGNOSIS — I509 Heart failure, unspecified: Secondary | ICD-10-CM | POA: Diagnosis not present

## 2019-09-06 DIAGNOSIS — E782 Mixed hyperlipidemia: Secondary | ICD-10-CM | POA: Diagnosis not present

## 2019-09-06 DIAGNOSIS — R609 Edema, unspecified: Secondary | ICD-10-CM | POA: Diagnosis not present

## 2019-09-06 DIAGNOSIS — I10 Essential (primary) hypertension: Secondary | ICD-10-CM | POA: Diagnosis not present

## 2019-09-06 DIAGNOSIS — I4891 Unspecified atrial fibrillation: Secondary | ICD-10-CM | POA: Diagnosis not present

## 2019-09-19 NOTE — Progress Notes (Signed)
Name: Jason Fitzgerald   MRN: [id number]    DOB: [birth year]   Date:09/20/2019       Progress Note  Subjective  Chief Complaint  Chief Complaint  Patient presents with   Follow-up   Headache    HPI   History of alcoholism: hequit early 2020, had a relapse but said not drinking since end of 2020. He is drinking water, sweet tea  and ginger ale. Appetite waxes and wanes, weight is stable   He states he drinks apple juice    HTN/CHF and Afib: rate controlled with beta-blocker, amiodarone, also taking aldactone,since recent episode of acute CHF - July 2021 he is back on lasix andpotassium, he has been taking Atorvastatin .He has SOB with a activities but denies, palpitationor orthopnea, he states since last visit to [facility] and back on lasix lower extremity edema has resolved . He is taking Xarelto and aspirin given by Dr. Humphrey Rolls  RA: under the care of Dr. Jefm Bryant. Heis off plaquenil ( he states stopped by cardiologist) still takingprednisone and [place]. He states pain is under control at this timehe states, he states only problem currently is left elbow when he wakes up, no longer having knee problem   CAD: he states he has been told he had a heart attack, normal LDL, he is on statin therapy. He is under the care of Dr. Humphrey Rolls   Pulmonary fiboris: never smoked, states his mother did and also co-workers- exposure to second hand smoking , under the care of Dr. Raul Del, has daily cough but nowheezing , hehas SOB with activity, CT chest done 2020  showed bilateral interstitial lung opacities with pulmonary fibrosis.He is on Symbicort   IMPRESSION: 1. Mild-to-moderate interval progression of basilar predominant fibrotic interstitial lung disease with extensive honeycombing, compatible with usual interstitial pneumonia (UIP). Findings are consistent with UIP per consensus guidelines: Diagnosis of Idiopathic Pulmonary Fibrosis: An Official ATS/ERS/JRS/ALAT Clinical Practice  Guideline. [place], Iss 5, [phone], Oct 03 2016. 2. One vessel coronary atherosclerosis. 3. Prominent symmetric bilateral gynecomastia, worsened.  Aortic Atherosclerosis (ICD10-I70.0) : he is on statin therapy and aspirin   Neuropathy: on both feet, he states gabapentin has helped left leg more than right ,he has toe tingling. He has seen Dr. Manuella Ghazi in the past. He states feeling better since he quit drinking, he would like a refill of vitamin B1  Anemia: he has not seen hematologist, platelets has normalized anemia has been stable, reviewed labs done July 2021 . Anemia of chronic disease    Patient Active Problem List   Diagnosis Date Noted   Acute CHF (congestive heart failure) ([place]) 08/05/2019   Diarrhea    Dizziness 12/17/2018   Hypotension 12/17/2018   Hypophosphatemia 12/17/2018   Hypomagnesemia 12/17/2018   Positive D-dimer 12/17/2018   Abnormal LFTs (liver function tests) 12/17/2018   Anemia 12/17/2018   Pulmonary fibrosis (HCC)    GERD (gastroesophageal reflux disease)    COPD (chronic obstructive pulmonary disease) (HCC)    CHF (congestive heart failure) ([place])    Encounter for screening colonoscopy    Polyp of transverse colon    Internal hemorrhoids    Hypokalemia 02/03/2018   History of alcoholism ([place]) 12/08/2017   Rheumatoid arthritis involving multiple sites with positive rheumatoid factor ([place]) 11/26/8525   Chronic systolic CHF (congestive heart failure) ([place]) 03/12/2017   AF (paroxysmal atrial fibrillation) ([place]) 03/12/2017   Arthritis of left knee 08/01/2016   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  Chronic cough 05/29/2015   CAFL (chronic airflow limitation) ([place]) 10/24/2014   Arteriosclerosis of coronary artery 10/24/2014   History of fundoplication 85/03/7739   HTN (hypertension) 10/24/2014   LAD (lymphadenopathy), mediastinal 07/13/2013   Interstitial lung disease ([place]) 06/19/2013    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    Past Surgical History:  Procedure Laterality Date   COLONOSCOPY WITH PROPOFOL N/A 08/19/2018   Procedure: COLONOSCOPY WITH PROPOFOL;  Surgeon: Virgel Manifold, MD;  Location: [facility] ENDOSCOPY;  Service: Endoscopy;  Laterality: N/A;   HERNIA REPAIR Right 1973   open   INGUINAL HERNIA REPAIR Right 11/08/2014   Procedure: LAPAROSCOPIC RIGHT INGUINAL HERNIA REPAIR;  Surgeon: Hubbard Robinson, MD;  Location: [facility] ORS;  Service: General;  Laterality: Right;   knee arthroscopy Right 1972   RIGHT/LEFT HEART CATH AND CORONARY ANGIOGRAPHY Right 03/05/2017   Procedure: RIGHT/LEFT HEART CATH AND CORONARY ANGIOGRAPHY;  Surgeon: Dionisio David, MD;  Location: [place] [facility];  Service: Cardiovascular;  Laterality: Right;    Family History  Problem Relation Age of Onset   Diabetes Mother    Cancer Father    AAA (abdominal aortic aneurysm) Brother     Social History   Tobacco Use   Smoking status: Never Smoker   Smokeless tobacco: Never Used  Substance Use Topics   Alcohol use: Not Currently    Alcohol/week: 0.0 standard drinks    Comment: 2 quarts a week, he quit again 12/2018     Current Outpatient Medications:    amiodarone (PACERONE) 200 MG tablet, Take 200 mg by mouth daily., Disp: , Rfl:    aspirin EC 81 MG tablet, Take 325 mg by mouth daily. Swallow whole., Disp: , Rfl:    atorvastatin (LIPITOR) 10 MG tablet, Take 1 tablet (10 mg total) by mouth daily., Disp: 90 tablet, Rfl: 0   budesonide-formoterol (SYMBICORT) 160-4.5 MCG/ACT inhaler, Inhale 1-2 puffs into the lungs daily as needed (respiratory difficulty). , Disp: , Rfl:    furosemide (LASIX) 40 MG tablet, Take 1 tablet (40 mg total) by mouth daily., Disp: 30 tablet, Rfl: 0   leflunomide (ARAVA) 10 MG tablet, Take 10 mg by mouth daily. , Disp: , Rfl:    metoprolol succinate (TOPROL-XL) 50 MG 24 hr tablet, Take 50 mg by mouth daily. Take with or  immediately following a meal., Disp: , Rfl:    potassium chloride SA (KLOR-CON) 20 MEQ tablet, Take 1 tablet (20 mEq total) by mouth daily., Disp: 30 tablet, Rfl: 0   predniSONE (DELTASONE) 5 MG tablet, Take 2.5 mg by mouth daily. , Disp: , Rfl:    rivaroxaban (XARELTO) 20 MG TABS tablet, Take 1 tablet (20 mg total) by mouth daily with supper., Disp: 30 tablet, Rfl: 0   spironolactone (ALDACTONE) 25 MG tablet, Take 1 tablet (25 mg total) by mouth daily., Disp: 30 tablet, Rfl: 0   thiamine 100 MG tablet, Take 1 tablet (100 mg total) by mouth daily. Every other day, Disp: 45 tablet, Rfl: 1   valsartan (DIOVAN) 80 MG tablet, Take 80 mg by mouth daily. , Disp: , Rfl:   No Known Allergies  I personally reviewed active problem list, medication list, allergies, family history, social history, health maintenance with the patient/caregiver today.   ROS  Constitutional: Negative for fever or weight change.  Respiratory: Negative for cough , positive for  shortness of breath.   Cardiovascular: Negative for chest pain or palpitations.  Gastrointestinal: Negative for abdominal pain, no bowel changes.  Musculoskeletal:  Negative for gait problem or joint swelling.  Skin: Negative for rash.  Neurological: Negative for dizziness , positive for  Headache ( states glasses are tight and will get it adjusted) .  No other specific complaints in a complete review of systems (except as listed in HPI above).  Objective  Vitals:   09/20/19 0930  BP: 122/80  Pulse: 89  Resp: 18  Temp: 98.5 F (36.9 C)  TempSrc: Oral  SpO2: 98%  Weight: 195 lb (88.5 kg)  Height: 5\' 9"  (1.753 m)    Body mass index is 28.8 kg/m.  Physical Exam  Constitutional: Patient appears well-developed and well-nourished. Overweight. No distress.  HEENT: head atraumatic, normocephalic, pupils equal and reactive to light, neck supple, throat within normal limits Cardiovascular: Normal rate, irregular rhythm and normal heart  sounds.  No murmur heard. No BLE edema. Pulmonary/Chest: Effort normal and breath sounds normal. No respiratory distress. Abdominal: Soft.  There is no tenderness. Psychiatric: Patient has a normal mood and affect. behavior is normal. Judgment and thought content normal.   Recent Results (from the past 2160 hour(s))  Basic metabolic panel     Status: Abnormal   Collection Time: 08/05/19  1:52 AM  Result Value Ref Range   Sodium 142 135 - 145 mmol/L   Potassium 3.5 3.5 - 5.1 mmol/L   Chloride 105 98 - 111 mmol/L   CO2 27 22 - 32 mmol/L   Glucose, Bld 140 (H) 70 - 99 mg/dL    Comment: Glucose reference range applies only to samples taken after fasting for at least 8 hours.   BUN 17 8 - 23 mg/dL   Creatinine, Ser 0.71 0.61 - 1.24 mg/dL   Calcium 8.4 (L) 8.9 - 10.3 mg/dL   GFR calc non Af Amer >60 >60 mL/min   GFR calc Af Amer >60 >60 mL/min   Anion gap 10 5 - 15    Comment: Performed at [facility], [address] [place], [place] [address]  CBC     Status: Abnormal   Collection Time: 08/05/19  1:52 AM  Result Value Ref Range   WBC 6.6 4.0 - 10.5 K/uL   RBC 4.10 (L) 4.22 - 5.81 MIL/uL   Hemoglobin 10.9 (L) 13.0 - 17.0 g/dL   HCT 33.7 (L) 39 - 52 %   MCV 82.2 80.0 - 100.0 fL   MCH 26.6 26.0 - 34.0 pg   MCHC 32.3 30.0 - 36.0 g/dL   RDW 20.4 (H) 11.5 - 15.5 %   Platelets 274 150 - 400 K/uL   nRBC 0.0 0.0 - 0.2 %    Comment: Performed at [facility], [place], [place] [address]  Troponin I (High Sensitivity)     Status: Abnormal   Collection Time: 08/05/19  1:52 AM  Result Value Ref Range   Troponin I (High Sensitivity) 21 (H) <18 ng/L    Comment: (NOTE) Elevated high sensitivity troponin I (hsTnI) values and significant  changes across serial measurements may suggest ACS but many other  chronic and acute conditions are known to elevate hsTnI results.  Refer to the "Links" section for chest pain algorithms and additional   guidance. Performed at [facility], [address], [place] [address]   Brain natriuretic peptide     Status: Abnormal   Collection Time: 08/05/19  1:52 AM  Result Value Ref Range   B Natriuretic Peptide 1,110.6 (H) 0.0 - 100.0 pg/mL    Comment: Performed  at [facility], [address] [place], [place] [address]  SARS Coronavirus 2 by RT PCR (hospital order, performed in [facility] hospital lab) Nasopharyngeal Nasopharyngeal Swab     Status: None   Collection Time: 08/05/19  3:04 AM   Specimen: Nasopharyngeal Swab  Result Value Ref Range   SARS Coronavirus 2 NEGATIVE NEGATIVE    Comment: (NOTE) SARS-CoV-2 target nucleic acids are NOT DETECTED.  The SARS-CoV-2 RNA is generally detectable in upper and lower respiratory specimens during the acute phase of infection. The lowest concentration of SARS-CoV-2 viral copies this assay can detect is 250 copies / mL. A negative result does not preclude SARS-CoV-2 infection and should not be used as the sole basis for treatment or other patient management decisions.  A negative result may occur with improper specimen collection / handling, submission of specimen other than nasopharyngeal swab, presence of viral mutation(s) within the areas targeted by this assay, and inadequate number of viral copies (<250 copies / mL). A negative result must be combined with clinical observations, patient history, and epidemiological information.  Fact Sheet for Patients:   StrictlyIdeas.no  Fact Sheet for Healthcare Providers: BankingDealers.co.za  This test is not yet approved or  cleared by the [place] FDA and has been authorized for detection and/or diagnosis of SARS-CoV-2 by FDA under an Emergency Use Authorization (EUA).  This EUA will remain in effect (meaning this test can be used) for the duration of the COVID-19 declaration under Section 564(b)(1) of the Act,  21 U.S.C. section 360bbb-3(b)(1), unless the authorization is terminated or revoked sooner.  Performed at [facility], [place], [place] [address]   Troponin I (High Sensitivity)     Status: Abnormal   Collection Time: 08/05/19  4:00 AM  Result Value Ref Range   Troponin I (High Sensitivity) 24 (H) <18 ng/L    Comment: (NOTE) Elevated high sensitivity troponin I (hsTnI) values and significant  changes across serial measurements may suggest ACS but many other  chronic and acute conditions are known to elevate hsTnI results.  Refer to the "Links" section for chest pain algorithms and additional  guidance. Performed at [facility], [address] [place], [place] [address]   ECHOCARDIOGRAM COMPLETE     Status: None   Collection Time: 08/05/19  3:13 PM  Result Value Ref Range   Weight 3,414.4 oz   Height 69 in   BP 126/73 mmHg  Basic metabolic panel     Status: Abnormal   Collection Time: 08/06/19  6:13 AM  Result Value Ref Range   Sodium 141 135 - 145 mmol/L   Potassium 3.4 (L) 3.5 - 5.1 mmol/L   Chloride 99 98 - 111 mmol/L   CO2 32 22 - 32 mmol/L   Glucose, Bld 112 (H) 70 - 99 mg/dL    Comment: Glucose reference range applies only to samples taken after fasting for at least 8 hours.   BUN 21 8 - 23 mg/dL   Creatinine, Ser 0.84 0.61 - 1.24 mg/dL   Calcium 8.3 (L) 8.9 - 10.3 mg/dL   GFR calc non Af Amer >60 >60 mL/min   GFR calc Af Amer >60 >60 mL/min   Anion gap 10 5 - 15    Comment: Performed at [facility], [address], [place], [place] [address]  Basic metabolic panel     Status: Abnormal   Collection Time: 08/07/19  5:27 AM  Result Value Ref Range   Sodium  138 135 - 145 mmol/L   Potassium 3.5 3.5 - 5.1 mmol/L   Chloride 97 (L) 98 - 111 mmol/L   CO2 32 22 - 32 mmol/L   Glucose, Bld 124 (H) 70 - 99 mg/dL    Comment: Glucose reference range applies only to samples taken after fasting for at least 8 hours.   BUN 25  (H) 8 - 23 mg/dL   Creatinine, Ser 0.82 0.61 - 1.24 mg/dL   Calcium 8.4 (L) 8.9 - 10.3 mg/dL   GFR calc non Af Amer >60 >60 mL/min   GFR calc Af Amer >60 >60 mL/min   Anion gap 9 5 - 15    Comment: Performed at [facility], [address] [place], [place] [address]      PHQ2/9: Depression screen [facility] 2/9 09/20/2019 06/19/2019 04/18/2019 04/12/2019 03/20/2019  Decreased Interest 1 0 0 0 0  Down, Depressed, Hopeless 0 0 0 0 0  PHQ - 2 Score 1 0 0 0 0  Altered sleeping 2 0 - 1 0  Tired, decreased energy 2 0 - 2 0  Change in appetite 0 0 - 3 0  Feeling bad or failure about yourself  0 0 - 1 0  Trouble concentrating 3 0 - 0 0  Moving slowly or fidgety/restless 1 0 - 0 0  Suicidal thoughts 0 0 - 0 0  PHQ-9 Score 9 0 - 7 0  Difficult doing work/chores Somewhat difficult - - Not difficult at all Not difficult at all  Some recent data might be hidden    phq 9 is positive   Fall Risk: Fall Risk  09/20/2019 06/19/2019 04/18/2019 04/12/2019 03/20/2019  Falls in the past year? 0 0 0 0 0  Number falls in past yr: 0 0 0 0 0  Injury with Fall? 0 0 0 0 0  Risk for fall due to : - - Orthopedic patient - -  Follow up - - Falls prevention discussed - -     Functional Status Survey: Is the patient deaf or have difficulty hearing?: No Does the patient have difficulty seeing, even when wearing glasses/contacts?: No Does the patient have difficulty concentrating, remembering, or making decisions?: No Does the patient have difficulty walking or climbing stairs?: No Does the patient have difficulty dressing or bathing?: No Does the patient have difficulty doing errands alone such as visiting a doctor's office or shopping?: No    Assessment & Plan   1. Dyslipidemia  - atorvastatin (LIPITOR) 10 MG tablet; Take 1 tablet (10 mg total) by mouth daily.  Dispense: 90 tablet; Refill: 1 - Lipid panel  2. Coronary artery disease involving native coronary artery of native heart without angina  pectoris  - atorvastatin (LIPITOR) 10 MG tablet; Take 1 tablet (10 mg total) by mouth daily.  Dispense: 90 tablet; Refill: 1  3. Vitamin B1 deficiency  - Vitamin B1  4. Neuropathy  Doing well at this time  5. Essential hypertension  - COMPLETE METABOLIC PANEL WITH GFR  6. Rheumatoid arthritis involving multiple sites with positive rheumatoid factor (HCC)  Keep follow up with Dr. Jefm Bryant   7. Pulmonary fibrosis ([place])  Keep follow up with dr. Raul Del   8. History of alcoholism ([place])  He states still not drinking   9. Atrial fibrillation, unspecified type ([place])  - Lipid panel  10. Anemia of chronic disease  Reviewed labs done at [facility]

## 2019-09-20 ENCOUNTER — Other Ambulatory Visit: Payer: Self-pay

## 2019-09-20 ENCOUNTER — Encounter: Payer: Self-pay | Admitting: Family Medicine

## 2019-09-20 ENCOUNTER — Ambulatory Visit (INDEPENDENT_AMBULATORY_CARE_PROVIDER_SITE_OTHER): Payer: Medicare Other | Admitting: Family Medicine

## 2019-09-20 VITALS — BP 122/80 | HR 89 | Temp 98.5°F | Resp 18 | Ht 69.0 in | Wt 195.0 lb

## 2019-09-20 DIAGNOSIS — I2510 Atherosclerotic heart disease of native coronary artery without angina pectoris: Secondary | ICD-10-CM | POA: Diagnosis not present

## 2019-09-20 DIAGNOSIS — J8410 Pulmonary fibrosis, unspecified: Secondary | ICD-10-CM | POA: Diagnosis not present

## 2019-09-20 DIAGNOSIS — M0579 Rheumatoid arthritis with rheumatoid factor of multiple sites without organ or systems involvement: Secondary | ICD-10-CM

## 2019-09-20 DIAGNOSIS — E519 Thiamine deficiency, unspecified: Secondary | ICD-10-CM

## 2019-09-20 DIAGNOSIS — D638 Anemia in other chronic diseases classified elsewhere: Secondary | ICD-10-CM

## 2019-09-20 DIAGNOSIS — G629 Polyneuropathy, unspecified: Secondary | ICD-10-CM

## 2019-09-20 DIAGNOSIS — E785 Hyperlipidemia, unspecified: Secondary | ICD-10-CM | POA: Diagnosis not present

## 2019-09-20 DIAGNOSIS — I4821 Permanent atrial fibrillation: Secondary | ICD-10-CM

## 2019-09-20 DIAGNOSIS — F1021 Alcohol dependence, in remission: Secondary | ICD-10-CM

## 2019-09-20 DIAGNOSIS — I10 Essential (primary) hypertension: Secondary | ICD-10-CM

## 2019-09-20 MED ORDER — ATORVASTATIN CALCIUM 10 MG PO TABS: 10.0000 mg | ORAL_TABLET | Freq: Every day | ORAL | 1 refills | Status: DC

## 2019-09-20 NOTE — Patient Instructions (Signed)

## 2019-10-24 DIAGNOSIS — R0600 Dyspnea, unspecified: Secondary | ICD-10-CM | POA: Diagnosis not present

## 2019-10-24 DIAGNOSIS — J432 Centrilobular emphysema: Secondary | ICD-10-CM | POA: Diagnosis not present

## 2019-10-24 DIAGNOSIS — J849 Interstitial pulmonary disease, unspecified: Secondary | ICD-10-CM | POA: Diagnosis not present

## 2019-11-27 DIAGNOSIS — R0600 Dyspnea, unspecified: Secondary | ICD-10-CM | POA: Diagnosis not present

## 2019-11-27 DIAGNOSIS — J849 Interstitial pulmonary disease, unspecified: Secondary | ICD-10-CM | POA: Diagnosis not present

## 2019-11-27 DIAGNOSIS — Z8709 Personal history of other diseases of the respiratory system: Secondary | ICD-10-CM | POA: Diagnosis not present

## 2019-11-27 DIAGNOSIS — J439 Emphysema, unspecified: Secondary | ICD-10-CM | POA: Diagnosis not present

## 2019-11-28 ENCOUNTER — Other Ambulatory Visit: Payer: Self-pay | Admitting: Specialist

## 2019-11-28 DIAGNOSIS — J849 Interstitial pulmonary disease, unspecified: Secondary | ICD-10-CM

## 2019-12-08 ENCOUNTER — Observation Stay: Payer: Medicare Other

## 2019-12-08 ENCOUNTER — Emergency Department: Payer: Medicare Other

## 2019-12-08 ENCOUNTER — Encounter: Payer: Self-pay | Admitting: Emergency Medicine

## 2019-12-08 ENCOUNTER — Other Ambulatory Visit: Payer: Self-pay

## 2019-12-08 ENCOUNTER — Inpatient Hospital Stay
Admission: EM | Admit: 2019-12-08 | Discharge: 2019-12-13 | DRG: 291 | Disposition: A | Discharge status: Home / Self Care | Payer: Medicare Other | Attending: Obstetrics and Gynecology | Admitting: Obstetrics and Gynecology

## 2019-12-08 DIAGNOSIS — Z7982 Long term (current) use of aspirin: Secondary | ICD-10-CM

## 2019-12-08 DIAGNOSIS — Z9119 Patient's noncompliance with other medical treatment and regimen: Secondary | ICD-10-CM

## 2019-12-08 DIAGNOSIS — Z79899 Other long term (current) drug therapy: Secondary | ICD-10-CM

## 2019-12-08 DIAGNOSIS — F1021 Alcohol dependence, in remission: Secondary | ICD-10-CM | POA: Diagnosis not present

## 2019-12-08 DIAGNOSIS — Z7951 Long term (current) use of inhaled steroids: Secondary | ICD-10-CM

## 2019-12-08 DIAGNOSIS — I426 Alcoholic cardiomyopathy: Secondary | ICD-10-CM | POA: Diagnosis present

## 2019-12-08 DIAGNOSIS — J84112 Idiopathic pulmonary fibrosis: Secondary | ICD-10-CM | POA: Diagnosis present

## 2019-12-08 DIAGNOSIS — E785 Hyperlipidemia, unspecified: Secondary | ICD-10-CM | POA: Diagnosis not present

## 2019-12-08 DIAGNOSIS — J849 Interstitial pulmonary disease, unspecified: Secondary | ICD-10-CM | POA: Diagnosis not present

## 2019-12-08 DIAGNOSIS — E876 Hypokalemia: Secondary | ICD-10-CM | POA: Diagnosis not present

## 2019-12-08 DIAGNOSIS — Z9114 Patient's other noncompliance with medication regimen: Secondary | ICD-10-CM

## 2019-12-08 DIAGNOSIS — I959 Hypotension, unspecified: Secondary | ICD-10-CM | POA: Diagnosis present

## 2019-12-08 DIAGNOSIS — I5023 Acute on chronic systolic (congestive) heart failure: Secondary | ICD-10-CM | POA: Diagnosis present

## 2019-12-08 DIAGNOSIS — I159 Secondary hypertension, unspecified: Secondary | ICD-10-CM | POA: Diagnosis not present

## 2019-12-08 DIAGNOSIS — I340 Nonrheumatic mitral (valve) insufficiency: Secondary | ICD-10-CM | POA: Diagnosis not present

## 2019-12-08 DIAGNOSIS — R778 Other specified abnormalities of plasma proteins: Secondary | ICD-10-CM | POA: Diagnosis present

## 2019-12-08 DIAGNOSIS — M0579 Rheumatoid arthritis with rheumatoid factor of multiple sites without organ or systems involvement: Secondary | ICD-10-CM | POA: Diagnosis present

## 2019-12-08 DIAGNOSIS — F1010 Alcohol abuse, uncomplicated: Secondary | ICD-10-CM | POA: Diagnosis present

## 2019-12-08 DIAGNOSIS — J8410 Pulmonary fibrosis, unspecified: Secondary | ICD-10-CM | POA: Diagnosis present

## 2019-12-08 DIAGNOSIS — I428 Other cardiomyopathies: Secondary | ICD-10-CM | POA: Diagnosis present

## 2019-12-08 DIAGNOSIS — Z833 Family history of diabetes mellitus: Secondary | ICD-10-CM

## 2019-12-08 DIAGNOSIS — R4781 Slurred speech: Secondary | ICD-10-CM | POA: Diagnosis not present

## 2019-12-08 DIAGNOSIS — R590 Localized enlarged lymph nodes: Secondary | ICD-10-CM | POA: Diagnosis present

## 2019-12-08 DIAGNOSIS — I248 Other forms of acute ischemic heart disease: Secondary | ICD-10-CM | POA: Diagnosis not present

## 2019-12-08 DIAGNOSIS — R079 Chest pain, unspecified: Secondary | ICD-10-CM | POA: Diagnosis present

## 2019-12-08 DIAGNOSIS — J9601 Acute respiratory failure with hypoxia: Secondary | ICD-10-CM | POA: Diagnosis present

## 2019-12-08 DIAGNOSIS — I10 Essential (primary) hypertension: Secondary | ICD-10-CM | POA: Diagnosis present

## 2019-12-08 DIAGNOSIS — J449 Chronic obstructive pulmonary disease, unspecified: Secondary | ICD-10-CM | POA: Diagnosis present

## 2019-12-08 DIAGNOSIS — K219 Gastro-esophageal reflux disease without esophagitis: Secondary | ICD-10-CM | POA: Diagnosis present

## 2019-12-08 DIAGNOSIS — R002 Palpitations: Secondary | ICD-10-CM | POA: Diagnosis not present

## 2019-12-08 DIAGNOSIS — I272 Pulmonary hypertension, unspecified: Secondary | ICD-10-CM | POA: Diagnosis present

## 2019-12-08 DIAGNOSIS — R42 Dizziness and giddiness: Secondary | ICD-10-CM | POA: Diagnosis not present

## 2019-12-08 DIAGNOSIS — I4891 Unspecified atrial fibrillation: Secondary | ICD-10-CM | POA: Diagnosis not present

## 2019-12-08 DIAGNOSIS — M2548 Effusion, other site: Secondary | ICD-10-CM | POA: Diagnosis not present

## 2019-12-08 DIAGNOSIS — Z7901 Long term (current) use of anticoagulants: Secondary | ICD-10-CM

## 2019-12-08 DIAGNOSIS — Z809 Family history of malignant neoplasm, unspecified: Secondary | ICD-10-CM | POA: Diagnosis not present

## 2019-12-08 DIAGNOSIS — I110 Hypertensive heart disease with heart failure: Secondary | ICD-10-CM | POA: Diagnosis not present

## 2019-12-08 DIAGNOSIS — R7989 Other specified abnormal findings of blood chemistry: Secondary | ICD-10-CM | POA: Diagnosis present

## 2019-12-08 DIAGNOSIS — Z7952 Long term (current) use of systemic steroids: Secondary | ICD-10-CM

## 2019-12-08 DIAGNOSIS — R0602 Shortness of breath: Secondary | ICD-10-CM | POA: Diagnosis not present

## 2019-12-08 DIAGNOSIS — Z87891 Personal history of nicotine dependence: Secondary | ICD-10-CM

## 2019-12-08 DIAGNOSIS — I5022 Chronic systolic (congestive) heart failure: Secondary | ICD-10-CM | POA: Diagnosis not present

## 2019-12-08 DIAGNOSIS — I5033 Acute on chronic diastolic (congestive) heart failure: Secondary | ICD-10-CM | POA: Diagnosis not present

## 2019-12-08 DIAGNOSIS — R29818 Other symptoms and signs involving the nervous system: Secondary | ICD-10-CM | POA: Diagnosis not present

## 2019-12-08 DIAGNOSIS — I480 Paroxysmal atrial fibrillation: Secondary | ICD-10-CM | POA: Diagnosis present

## 2019-12-08 DIAGNOSIS — I351 Nonrheumatic aortic (valve) insufficiency: Secondary | ICD-10-CM | POA: Diagnosis not present

## 2019-12-08 DIAGNOSIS — Z20822 Contact with and (suspected) exposure to covid-19: Secondary | ICD-10-CM | POA: Diagnosis present

## 2019-12-08 DIAGNOSIS — H748X1 Other specified disorders of right middle ear and mastoid: Secondary | ICD-10-CM | POA: Diagnosis not present

## 2019-12-08 DIAGNOSIS — F1020 Alcohol dependence, uncomplicated: Secondary | ICD-10-CM | POA: Diagnosis present

## 2019-12-08 LAB — BASIC METABOLIC PANEL
Anion gap: 12 (ref 5–15)
BUN: 9 mg/dL (ref 8–23)
CO2: 28 mmol/L (ref 22–32)
Calcium: 8.4 mg/dL — ABNORMAL LOW (ref 8.9–10.3)
Chloride: 92 mmol/L — ABNORMAL LOW (ref 98–111)
Creatinine, Ser: 0.79 mg/dL (ref 0.61–1.24)
GFR, Estimated: >60 mL/min (ref >60)
Glucose, Bld: 109 mg/dL — ABNORMAL HIGH (ref 70–99)
Potassium: 2.7 mmol/L — CL (ref 3.5–5.1)
Sodium: 132 mmol/L — ABNORMAL LOW (ref 135–145)

## 2019-12-08 LAB — RESPIRATORY PANEL BY RT PCR (FLU A&B, COVID)
Influenza A by PCR: NEGATIVE
Influenza B by PCR: NEGATIVE
SARS Coronavirus 2 by RT PCR: NEGATIVE

## 2019-12-08 LAB — CBC
HCT: 39.9 % (ref 39.0–52.0)
Hemoglobin: 13.1 g/dL (ref 13.0–17.0)
MCH: 27.2 pg (ref 26.0–34.0)
MCHC: 32.8 g/dL (ref 30.0–36.0)
MCV: 83 fL (ref 80.0–100.0)
Platelets: 284 10*3/uL (ref 150–400)
RBC: 4.81 MIL/uL (ref 4.22–5.81)
RDW: 15 % (ref 11.5–15.5)
WBC: 5.5 10*3/uL (ref 4.0–10.5)
nRBC: 0 % (ref 0.0–0.2)

## 2019-12-08 LAB — TROPONIN I (HIGH SENSITIVITY)
Troponin I (High Sensitivity): 39 ng/L — ABNORMAL HIGH (ref <18)
Troponin I (High Sensitivity): 35 ng/L — ABNORMAL HIGH (ref <18)
Troponin I (High Sensitivity): 34 ng/L — ABNORMAL HIGH (ref <18)

## 2019-12-08 LAB — URINE DRUG SCREEN, QUALITATIVE (ARMC ONLY)
Amphetamines, Ur Screen: NOT DETECTED
Barbiturates, Ur Screen: NOT DETECTED
Benzodiazepine, Ur Scrn: NOT DETECTED
Cannabinoid 50 Ng, Ur ~~LOC~~: NOT DETECTED
Cocaine Metabolite,Ur ~~LOC~~: POSITIVE — AB
MDMA (Ecstasy)Ur Screen: NOT DETECTED
Methadone Scn, Ur: NOT DETECTED
Opiate, Ur Screen: NOT DETECTED
Phencyclidine (PCP) Ur S: NOT DETECTED
Tricyclic, Ur Screen: NOT DETECTED

## 2019-12-08 LAB — TSH: TSH: 0.104 u[IU]/mL — ABNORMAL LOW (ref 0.350–4.500)

## 2019-12-08 LAB — MAGNESIUM: Magnesium: 1.9 mg/dL (ref 1.7–2.4)

## 2019-12-08 LAB — BRAIN NATRIURETIC PEPTIDE: B Natriuretic Peptide: 630.1 pg/mL — ABNORMAL HIGH (ref 0.0–100.0)

## 2019-12-08 MED ORDER — LEFLUNOMIDE 20 MG PO TABS
10.0000 mg | ORAL_TABLET | Freq: Every day | ORAL | Status: DC
  Administered 2019-12-08 – 2019-12-13 (×6): 10 mg via ORAL
  Filled 2019-12-08 (×7): qty 0.5

## 2019-12-08 MED ORDER — ACETAMINOPHEN 325 MG PO TABS: 650.0000 mg | ORAL_TABLET | Freq: Four times a day (QID) | ORAL | Status: DC | PRN

## 2019-12-08 MED ORDER — LORAZEPAM 2 MG/ML IJ SOLN
0.0000 mg | Freq: Two times a day (BID) | INTRAMUSCULAR | Status: DC
  Administered 2019-12-10: 2 mg via INTRAVENOUS
  Filled 2019-12-08: qty 1

## 2019-12-08 MED ORDER — ADULT MULTIVITAMIN W/MINERALS CH
1.0000 | ORAL_TABLET | Freq: Every day | ORAL | Status: DC
  Administered 2019-12-08 – 2019-12-13 (×6): 1 via ORAL
  Filled 2019-12-08 (×7): qty 1

## 2019-12-08 MED ORDER — THIAMINE HCL 100 MG/ML IJ SOLN: 100.0000 mg | Freq: Every day | INTRAMUSCULAR | Status: DC

## 2019-12-08 MED ORDER — DILTIAZEM HCL-DEXTROSE 125-5 MG/125ML-% IV SOLN (PREMIX)
5.0000 mg/h | INTRAVENOUS | Status: DC
  Administered 2019-12-08: 10 mg/h via INTRAVENOUS
  Administered 2019-12-08: 5 mg/h via INTRAVENOUS
  Filled 2019-12-08 (×2): qty 125

## 2019-12-08 MED ORDER — SODIUM CHLORIDE 0.9 % IV BOLUS
1000.0000 mL | Freq: Once | INTRAVENOUS | Status: AC
  Administered 2019-12-08: 1000 mL via INTRAVENOUS

## 2019-12-08 MED ORDER — SPIRONOLACTONE 25 MG PO TABS
25.0000 mg | ORAL_TABLET | Freq: Every day | ORAL | Status: DC
  Administered 2019-12-08 – 2019-12-13 (×5): 25 mg via ORAL
  Filled 2019-12-08 (×7): qty 1

## 2019-12-08 MED ORDER — MOMETASONE FURO-FORMOTEROL FUM 200-5 MCG/ACT IN AERO
2.0000 | INHALATION_SPRAY | Freq: Two times a day (BID) | RESPIRATORY_TRACT | Status: DC
  Administered 2019-12-08 – 2019-12-13 (×10): 2 via RESPIRATORY_TRACT
  Filled 2019-12-08: qty 8.8

## 2019-12-08 MED ORDER — FUROSEMIDE 10 MG/ML IJ SOLN
INTRAMUSCULAR | Status: AC
  Administered 2019-12-08: 40 mg via INTRAVENOUS
  Filled 2019-12-08: qty 4

## 2019-12-08 MED ORDER — THIAMINE HCL 100 MG PO TABS
100.0000 mg | ORAL_TABLET | Freq: Every day | ORAL | Status: DC
  Administered 2019-12-08 – 2019-12-13 (×6): 100 mg via ORAL
  Filled 2019-12-08 (×6): qty 1

## 2019-12-08 MED ORDER — LORAZEPAM 2 MG/ML IJ SOLN: 1.0000 mg | INTRAMUSCULAR | Status: AC | PRN

## 2019-12-08 MED ORDER — DM-GUAIFENESIN ER 30-600 MG PO TB12
1.0000 | ORAL_TABLET | Freq: Two times a day (BID) | ORAL | Status: DC | PRN
  Administered 2019-12-10: 1 via ORAL
  Filled 2019-12-08: qty 1

## 2019-12-08 MED ORDER — LORAZEPAM 1 MG PO TABS
1.0000 mg | ORAL_TABLET | ORAL | Status: AC | PRN
  Administered 2019-12-08: 1 mg via ORAL
  Filled 2019-12-08: qty 1

## 2019-12-08 MED ORDER — IRBESARTAN 150 MG PO TABS
75.0000 mg | ORAL_TABLET | Freq: Every day | ORAL | Status: DC
  Administered 2019-12-08: 75 mg via ORAL
  Filled 2019-12-08 (×2): qty 1

## 2019-12-08 MED ORDER — THIAMINE HCL 100 MG PO TABS
100.0000 mg | ORAL_TABLET | Freq: Every day | ORAL | Status: DC
  Administered 2019-12-08: 100 mg via ORAL
  Filled 2019-12-08 (×2): qty 1

## 2019-12-08 MED ORDER — ASPIRIN EC 325 MG PO TBEC
325.0000 mg | DELAYED_RELEASE_TABLET | Freq: Every day | ORAL | Status: DC
  Administered 2019-12-08 – 2019-12-13 (×6): 325 mg via ORAL
  Filled 2019-12-08 (×7): qty 1

## 2019-12-08 MED ORDER — ONDANSETRON HCL 4 MG/2ML IJ SOLN
4.0000 mg | Freq: Three times a day (TID) | INTRAMUSCULAR | Status: DC | PRN
  Administered 2019-12-10: 4 mg via INTRAVENOUS
  Filled 2019-12-08: qty 2

## 2019-12-08 MED ORDER — FOLIC ACID 1 MG PO TABS
1.0000 mg | ORAL_TABLET | Freq: Every day | ORAL | Status: DC
  Administered 2019-12-08 – 2019-12-13 (×6): 1 mg via ORAL
  Filled 2019-12-08 (×8): qty 1

## 2019-12-08 MED ORDER — POTASSIUM CHLORIDE CRYS ER 20 MEQ PO TBCR
40.0000 meq | EXTENDED_RELEASE_TABLET | ORAL | Status: AC
  Administered 2019-12-08 (×2): 40 meq via ORAL
  Filled 2019-12-08 (×2): qty 2

## 2019-12-08 MED ORDER — IPRATROPIUM BROMIDE HFA 17 MCG/ACT IN AERS
2.0000 | INHALATION_SPRAY | RESPIRATORY_TRACT | Status: DC
  Administered 2019-12-08 – 2019-12-13 (×28): 2 via RESPIRATORY_TRACT
  Filled 2019-12-08: qty 12.9

## 2019-12-08 MED ORDER — FUROSEMIDE 10 MG/ML IJ SOLN
40.0000 mg | Freq: Two times a day (BID) | INTRAMUSCULAR | Status: DC
  Administered 2019-12-09 (×2): 40 mg via INTRAVENOUS
  Filled 2019-12-08 (×2): qty 4

## 2019-12-08 MED ORDER — ATORVASTATIN CALCIUM 10 MG PO TABS
10.0000 mg | ORAL_TABLET | Freq: Every day | ORAL | Status: DC
  Administered 2019-12-08 – 2019-12-13 (×6): 10 mg via ORAL
  Filled 2019-12-08 (×7): qty 1

## 2019-12-08 MED ORDER — FUROSEMIDE 40 MG PO TABS: 40.0000 mg | ORAL_TABLET | Freq: Every day | ORAL | Status: DC

## 2019-12-08 MED ORDER — PREDNISONE 10 MG PO TABS
5.0000 mg | ORAL_TABLET | Freq: Every day | ORAL | Status: DC
  Administered 2019-12-09 – 2019-12-10 (×2): 5 mg via ORAL
  Filled 2019-12-08 (×3): qty 1

## 2019-12-08 MED ORDER — LORAZEPAM 2 MG/ML IJ SOLN
0.0000 mg | Freq: Four times a day (QID) | INTRAMUSCULAR | Status: DC
  Administered 2019-12-08: 1 mg via INTRAVENOUS
  Filled 2019-12-08: qty 1

## 2019-12-08 MED ORDER — ALBUTEROL SULFATE HFA 108 (90 BASE) MCG/ACT IN AERS
2.0000 | INHALATION_SPRAY | RESPIRATORY_TRACT | Status: DC | PRN
  Filled 2019-12-08: qty 6.7

## 2019-12-08 MED ORDER — AMIODARONE HCL 200 MG PO TABS
200.0000 mg | ORAL_TABLET | Freq: Every day | ORAL | Status: DC
  Administered 2019-12-08 – 2019-12-11 (×3): 200 mg via ORAL
  Filled 2019-12-08 (×5): qty 1

## 2019-12-08 MED ORDER — RIVAROXABAN 20 MG PO TABS
20.0000 mg | ORAL_TABLET | Freq: Every day | ORAL | Status: DC
  Filled 2019-12-08: qty 1

## 2019-12-08 MED ORDER — HYDROCORTISONE NA SUCCINATE PF 100 MG IJ SOLR
50.0000 mg | Freq: Once | INTRAMUSCULAR | Status: AC
  Administered 2019-12-08: 50 mg via INTRAVENOUS
  Filled 2019-12-08: qty 2

## 2019-12-08 MED ORDER — POTASSIUM CHLORIDE 10 MEQ/100ML IV SOLN
10.0000 meq | INTRAVENOUS | Status: AC
  Administered 2019-12-08 (×3): 10 meq via INTRAVENOUS
  Filled 2019-12-08 (×3): qty 100

## 2019-12-08 MED ORDER — DILTIAZEM LOAD VIA INFUSION
10.0000 mg | Freq: Once | INTRAVENOUS | Status: AC
  Administered 2019-12-08: 10 mg via INTRAVENOUS
  Filled 2019-12-08: qty 10

## 2019-12-08 MED ORDER — SODIUM CHLORIDE 0.9 % IV SOLN: INTRAVENOUS | Status: DC

## 2019-12-08 MED ORDER — HYDRALAZINE HCL 20 MG/ML IJ SOLN: 5.0000 mg | INTRAMUSCULAR | Status: DC | PRN

## 2019-12-08 MED ORDER — METOPROLOL SUCCINATE ER 50 MG PO TB24
50.0000 mg | ORAL_TABLET | Freq: Every day | ORAL | Status: DC
  Administered 2019-12-08 – 2019-12-10 (×3): 50 mg via ORAL
  Filled 2019-12-08 (×3): qty 1

## 2019-12-08 MED ORDER — RIVAROXABAN 20 MG PO TABS
20.0000 mg | ORAL_TABLET | Freq: Every day | ORAL | Status: DC
  Administered 2019-12-08 – 2019-12-13 (×6): 20 mg via ORAL
  Filled 2019-12-08 (×7): qty 1

## 2019-12-08 NOTE — ED Notes (Signed)
Entered patient room in response to bipap alarm. Pt had removed mask and refused to put it back on. O2 sat noted at 84% Pt stated "I done had it on for a long time and I need a break". This nurse discussed the importance of the bipap and maintaining oxygenation. Pt still refused bipap but did agree to a non rebreather mask. Mask placed at 15L. O2 sat noted to improve to 94% but respiration remained at 38-40. Charles RN notified and was aware of situation and is discussing it with admitting.

## 2019-12-08 NOTE — ED Notes (Signed)
Entered room to find patient attempted to put bipap back on. Assisted patient, bi-pap resumed

## 2019-12-08 NOTE — ED Notes (Signed)
RN in room to check on pt, pt is resting with eyes closed at this time. Pt is in NAD.

## 2019-12-08 NOTE — ED Notes (Signed)
Pt reports chest pain, SOB, dizziness x2-3 days. Pt reports history of heart a fib and cardiac issues. No current medications. Confirms previous hospitalizations

## 2019-12-08 NOTE — ED Provider Notes (Signed)
[facility] Emergency Department Provider Note   ____________________________________________   First MD Initiated Contact with Patient 12/08/19 [phone]     (approximate)  I have reviewed the triage vital signs and the nursing notes.   HISTORY  Chief Complaint Chest Pain    HPI Jason Fitzgerald is a 69 y.o. male with a stated past medical history of CHF, COPD, GERD, and pulmonary fibrosis who presents for chest pain, palpitations, and dyspnea on exertion that have been present over the last 3 days.  Patient states that he has been nonadherent to his medication regimen over the last 5 days as he states "it is just too much" in reference to the amount of medications he is taking.  Patient states that the only thing he takes is for his arthritis and B vitamins over the last 5 days.  Patient states that he is also noticed an associated slurred speech over the last 3 days that patient states is still present and has been stable since onset.  Patient states that these palpitations are worse when lying down and partially relieved when sitting up.  Patient states that he is supposed to be on blood thinning medications but has not taken those for the last 5 days either.         Past Medical History:  Diagnosis Date  . CHF (congestive heart failure) ([place])   . Chronic cough   . COPD (chronic obstructive pulmonary disease) ([place])   . Elevated liver function tests   . Emphysema of lung ([place])   . Fibrosis, pulmonary, interstitial, diffuse ([place])   . GERD (gastroesophageal reflux disease)   . History of cocaine abuse ([place])   . Mediastinal lymphadenopathy   . Pulmonary fibrosis ([place])   . Right inguinal hernia     Patient Active Problem List   Diagnosis Date Noted  . Atrial fibrillation with RVR ([place]) 12/08/2019  . Slurred speech 12/08/2019  . Acute CHF (congestive heart failure) ([place]) 08/05/2019  . Diarrhea   . Dizziness 12/17/2018  . Hypotension 12/17/2018  .  Hypophosphatemia 12/17/2018  . Hypomagnesemia 12/17/2018  . Positive D-dimer 12/17/2018  . Abnormal LFTs (liver function tests) 12/17/2018  . Anemia 12/17/2018  . Pulmonary fibrosis ([place])   . GERD (gastroesophageal reflux disease)   . COPD (chronic obstructive pulmonary disease) ([place])   . CHF (congestive heart failure) ([place])   . Encounter for screening colonoscopy   . Polyp of transverse colon   . Internal hemorrhoids   . Hypokalemia 02/03/2018  . History of alcoholism ([place]) 12/08/2017  . Rheumatoid arthritis involving multiple sites with positive rheumatoid factor ([place]) 10/13/2017  . Chronic systolic CHF (congestive heart failure) ([place]) 03/12/2017  . AF (paroxysmal atrial fibrillation) ([place]) 03/12/2017  . Arthritis of left knee 08/01/2016  . 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 Chronic cough 05/29/2015  . CAFL (chronic airflow limitation) ([place]) 10/24/2014  . Arteriosclerosis of coronary artery 10/24/2014  . History of fundoplication 61/44/3154  . HTN (hypertension) 10/24/2014  . LAD (lymphadenopathy), mediastinal 07/13/2013  . Interstitial lung disease ([place]) 06/19/2013  . 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 COLONOSCOPY WITH PROPOFOL N/A 08/19/2018   Procedure: COLONOSCOPY WITH PROPOFOL;  Surgeon: Virgel Manifold, MD;  Location: [facility] ENDOSCOPY;  Service: Endoscopy;  Laterality: N/A;  . HERNIA REPAIR Right 1973   open  . INGUINAL HERNIA REPAIR Right 11/08/2014   Procedure: LAPAROSCOPIC RIGHT INGUINAL HERNIA REPAIR;  Surgeon: Hubbard Robinson, MD;  Location: [facility] ORS;  Service: General;  Laterality: Right;  . knee arthroscopy Right 1972  . RIGHT/LEFT HEART CATH AND CORONARY ANGIOGRAPHY Right 03/05/2017   Procedure: RIGHT/LEFT HEART CATH AND CORONARY ANGIOGRAPHY;  Surgeon: Dionisio David, MD;  Location: [facility];  Service: Cardiovascular;  Laterality: Right;    Prior to  Admission medications   Medication Sig Start Date End Date Taking? Authorizing Provider  amiodarone (PACERONE) 200 MG tablet Take 200 mg by mouth daily.    [provider]  aspirin EC 81 MG tablet Take 325 mg by mouth daily. Swallow whole.    [provider]  atorvastatin (LIPITOR) 10 MG tablet Take 1 tablet (10 mg total) by mouth daily. 09/20/19   Steele Sizer, MD  budesonide-formoterol (SYMBICORT) 160-4.5 MCG/ACT inhaler Inhale 1-2 puffs into the lungs daily as needed (respiratory difficulty).     [provider]  furosemide (LASIX) 40 MG tablet Take 1 tablet (40 mg total) by mouth daily. 08/08/19   Hongalgi, Lenis Dickinson, MD  leflunomide (ARAVA) 10 MG tablet Take 10 mg by mouth daily.     [provider]  metoprolol succinate (TOPROL-XL) 50 MG 24 hr tablet Take 50 mg by mouth daily. Take with or immediately following a meal.    Neoma Laming A, MD  potassium chloride SA (KLOR-CON) 20 MEQ tablet Take 1 tablet (20 mEq total) by mouth daily. 12/20/18   Loletha Grayer, MD  predniSONE (DELTASONE) 5 MG tablet Take 2.5 mg by mouth daily.     Emmaline Kluver., MD  rivaroxaban (XARELTO) 20 MG TABS tablet Take 1 tablet (20 mg total) by mouth daily with supper. 08/07/19   Hongalgi, Lenis Dickinson, MD  spironolactone (ALDACTONE) 25 MG tablet Take 1 tablet (25 mg total) by mouth daily. 08/07/19   Hongalgi, Lenis Dickinson, MD  thiamine 100 MG tablet Take 1 tablet (100 mg total) by mouth daily. Every other day 06/19/19   Steele Sizer, MD  valsartan (DIOVAN) 80 MG tablet Take 80 mg by mouth daily.  04/14/19   [provider]    Allergies Patient has no known allergies.  Family History  Problem Relation Age of Onset  . Diabetes Mother   . Cancer Father   . AAA (abdominal aortic aneurysm) Brother     Social History Social History   Tobacco Use  . Smoking status: Never Smoker  . Smokeless tobacco: Never Used  Vaping Use  . Vaping Use: Never used  Substance Use Topics   . Alcohol use: Not Currently    Alcohol/week: 0.0 standard drinks    Comment: 2 quarts a week, he quit again 12/2018  . Drug use: Not Currently    Types: Cocaine    Comment: as an young adult     Review of Systems Constitutional: No fever/chills Eyes: No visual changes. ENT: No sore throat. Cardiovascular: Denies chest pain. Respiratory: Denies shortness of breath. Gastrointestinal: No abdominal pain.  No nausea, no vomiting.  No diarrhea. Genitourinary: Negative for dysuria. Musculoskeletal: Negative for acute arthralgias Skin: Negative for rash. Neurological: Negative for headaches, weakness/numbness/paresthesias in any extremity.  Positive for dysarthria Psychiatric: Negative for suicidal ideation/homicidal ideation   ____________________________________________   PHYSICAL EXAM:  VITAL SIGNS: ED Triage Vitals  Enc Vitals Group     BP 12/08/19 0655 108/85     Pulse Rate 12/08/19 0659 (!) 150     Resp 12/08/19 0655 (!) 23     Temp --  Temp src --      SpO2 12/08/19 0657 99 %     Weight 12/08/19 0647 192 lb (87.1 kg)     Height 12/08/19 0647 5\' 6"  (1.676 m)     Head Circumference --      Peak Flow --      Pain Score 12/08/19 0650 9     Pain Loc --      Pain Edu? --      Excl. in [place]? --    Constitutional: Alert and oriented. Well appearing and in no acute distress. Eyes: Conjunctivae are normal. PERRL. Head: Atraumatic. Nose: No congestion/rhinnorhea. Mouth/Throat: Mucous membranes are moist. Neck: No stridor Cardiovascular: Tachycardic irregularly irregular rhythm.  Grossly normal heart sounds.  Good peripheral circulation. Respiratory: Normal respiratory effort.  No retractions. Gastrointestinal: Soft and nontender. No distention. Musculoskeletal: No obvious deformities Neurologic:  Normal speech and language. No gross focal neurologic deficits are appreciated. Skin:  Skin is warm and dry. No rash noted. Psychiatric: Mood and affect are normal. Speech  and behavior are normal.  ____________________________________________   LABS (all labs ordered are listed, but only abnormal results are displayed)  Labs Reviewed  BASIC METABOLIC PANEL - Abnormal; Notable for the following components:      Result Value   Sodium 132 (*)    Potassium 2.7 (*)    Chloride 92 (*)    Glucose, Bld 109 (*)    Calcium 8.4 (*)    All other components within normal limits  BRAIN NATRIURETIC PEPTIDE - Abnormal; Notable for the following components:   B Natriuretic Peptide 630.1 (*)    All other components within normal limits  RESPIRATORY PANEL BY RT PCR (FLU A&B, COVID)  MAGNESIUM  CBC  TROPONIN I (HIGH SENSITIVITY)   ____________________________________________  EKG  ED ECG REPORT I, Naaman Plummer, the attending physician, personally viewed and interpreted this ECG.  Date: 12/08/2019 EKG Time: 0643 Rate: 150 Rhythm: Atrial fibrillation with rapid ventricular response QRS Axis: normal Intervals: normal ST/T Wave abnormalities: normal Narrative Interpretation: no evidence of acute ischemia  ____________________________________________  RADIOLOGY  ED MD interpretation: Single view x-ray of the chest shows diffuse severe bilateral pulmonary interstitial prominence that is very mildly increased from previous exam.  No evidence of obvious pneumonia, pneumothorax, or widened mediastinum  CT without contrast of the head shows no acute findings including no gross bleeding or fractures  Official radiology report(s): DG Chest 1 View  Result Date: 12/08/2019 CLINICAL DATA:  Chest pain. Shortness of breath. Interstitial fibrosis. EXAM: CHEST  1 VIEW COMPARISON:  08/05/2019.  CT 12/17/2018, 03/30/2014. FINDINGS: Heart size normal. Diffuse severe bilateral pulmonary interstitial prominence again noted none consistent chronic interstitial lung disease. Interstitial prominence is increased from prior exam. Superimposed active process including pneumonitis  cannot be excluded. No significant interim change. Stable mild basilar pleural thickening. No pneumothorax. IMPRESSION: Diffuse severe bilateral pulmonary interstitial prominence again noted consistent with known chronic interstitial lung disease. Interstitial prominence has increased from prior exam. Superimposed active process including pneumonitis cannot be excluded. Electronically Signed   By: Marcello Moores  Register   On: 12/08/2019 07:39   CT Head Wo Contrast  Result Date: 12/08/2019 CLINICAL DATA:  Slurred speech. Chest pain and shortness of breath with dizziness. EXAM: CT HEAD WITHOUT CONTRAST TECHNIQUE: Contiguous axial images were obtained from the base of the skull through the vertex without intravenous contrast. COMPARISON:  None. FINDINGS: Brain: No evidence of acute infarction, hemorrhage, hydrocephalus, extra-axial collection or mass lesion/mass effect. Vascular:  No hyperdense vessel or unexpected calcification. Skull: Normal. Negative for fracture or focal lesion. Sinuses/Orbits: No acute finding. IMPRESSION: No acute finding or explanation for symptoms. Atherosclerosis. Electronically Signed   By: Monte Fantasia M.D.   On: 12/08/2019 07:41    ____________________________________________   PROCEDURES  Procedure(s) performed (including Critical Care):  .Critical Care Performed by: Naaman Plummer, MD Authorized by: Naaman Plummer, MD   Critical care provider statement:    Critical care time (minutes):  41   Critical care was necessary to treat or prevent imminent or life-threatening deterioration of the following conditions:  Cardiac failure   Critical care was time spent personally by me on the following activities:  Discussions with consultants, evaluation of patient's response to treatment, examination of patient, ordering and performing treatments and interventions, ordering and review of laboratory studies, ordering and review of radiographic studies, pulse oximetry, re-evaluation  of patient's condition, obtaining history from patient or surrogate and review of old charts   I assumed direction of critical care for this patient from another provider in my specialty: no   .1-3 Lead EKG Interpretation Performed by: Naaman Plummer, MD Authorized by: Naaman Plummer, MD     Interpretation: abnormal     ECG rate:  132   ECG rate assessment: tachycardic     Rhythm: atrial fibrillation     Ectopy: none     Conduction: normal       ____________________________________________   INITIAL IMPRESSION / ASSESSMENT AND PLAN / ED COURSE  As part of my medical decision making, I reviewed the following data within the [facility] notes reviewed and incorporated, Labs reviewed, EKG interpreted, Old chart reviewed, Radiograph reviewed and Notes from prior ED visits reviewed and incorporated        + atrial fibrillation w/ RVR DDx: Pneumothorax, Pneumonia, Pulmonary Embolus, Tamponade, ACS, Thyrotoxicosis.  No history or evidence decompensated heart failure. Given their history and exam it is likely this patient is unlikely to spontaneously revert to a rate controlled rhythm and necessitates a thorough workup for their arrhythmia. Workup: ECG, CXR, CBC, BMP, UA, Troponin, BNP, TSH, Ca-Mag-Phos Interventions: Defer Cardioversion (uncertain historical reliability with time of onset, increased risk of thromboembolic stroke).  Start diltiazem bolus and drip  Reassessment: Patient maintained NSR during multi-hour observation in ED. Disposition: Discharge home with prompt PCP follow up and cardiology referral.   Disposition: Admit      ____________________________________________   FINAL CLINICAL IMPRESSION(S) / ED DIAGNOSES  Final diagnoses:  Atrial fibrillation with RVR ([place])  Palpitations     ED Discharge Orders         Ordered    Amb referral to AF[facility]        12/08/19 0700           Note:  This document was prepared using  Dragon voice recognition software and may include unintentional dictation errors.   Naaman Plummer, MD 12/08/19 [phone]

## 2019-12-08 NOTE — ED Notes (Signed)
Pt placed on 2L [place]

## 2019-12-08 NOTE — ED Triage Notes (Signed)
Pt to triage via w/c, grunting resp; st left sided CP x 2 days, nonradiating accomp by [facility] Fall; EKG indicates afib 150, taken immed to room 12 by EDT Lattie Haw and report called to [employer]

## 2019-12-08 NOTE — ED Notes (Signed)
Spoke with Dr. Cheri [place], [facility] for pt to go to MRI without cardiac monitor. MRI will bring pump over to switch pts drips to MRI compatible equipment.

## 2019-12-08 NOTE — ED Notes (Signed)
Pt refusing to wear Bi-Pap, states "I don't need this. I know what I need. Yall going to kill some damn body with this." MD notified

## 2019-12-08 NOTE — ED Notes (Signed)
Pt dipping into mid 80's saturations. Placed on 2 L 

## 2019-12-08 NOTE — ED Notes (Signed)
Patient placed back on bipap by respiratory. Resting comfortably. Agreeable to blood draw

## 2019-12-08 NOTE — ED Notes (Signed)
Attempted to call report

## 2019-12-08 NOTE — ED Notes (Signed)
Pt transported to MRI 

## 2019-12-08 NOTE — H&P (Addendum)
History and Physical    Jason Fitzgerald OAC:[id number] DOB: 1950-06-03 DOA: 12/08/2019  Referring MD/NP/PA:   PCP: Steele Sizer, MD   Patient coming from:  The patient is coming from home.  At baseline, pt is independent for most of ADL.        Chief Complaint: Palpitation, chest pain, slurred speech  HPI: Jason Fitzgerald is a 69 y.o. male with medical history significant of hypertension, hyperlipidemia, COPD, GERD, interstitial lung disease, pulmonary fibrosis, cocaine abuse, sCHF with EF of 45 to 50%, atrial fibrillation on Xarelto (not compliant), rheumatoid arthritis, alcohol abuse, who presents with palpitation, chest pain and slurred speech.  Patient states that he has not taking all of his home medications for about 5 days.  He developed palpitation and chest pain in the past several days, which has been progressively worsening.  His chest pain is located substernal area, pressure-like, moderate, dull, nonradiating.  Associated with shortness breath.  Patient has dry cough, no fever or chills.  No nausea, vomiting, diarrhea, abdominal pain, symptoms of UTI.  Patient states that he has dizziness and slurred speech in the past several days.  No unilateral numbness or tingling to extremities.  No hearing loss or vision loss.  No facial droop.  Patient was found to have A. fib with RVR, heart rate is up to 150s in ED, Cardizem drip started.  ED Course: pt was found to have WBC 5.5, negative Covid PCR, BMP 630, potassium 2.7, trop 39, renal function okay, blood pressure 123/86, RR 32, 27, 18, oxygen saturation 98% on room air.  Chest x-ray showed interstitial lung disease.  CT head is negative for acute intracranial abnormalities.  Patient is placed on progressive plan for observation.  Review of Systems:   General: no fevers, chills, no body weight gain, has fatigue HEENT: no blurry vision, hearing changes or sore throat Respiratory: has dyspnea, coughing, no wheezing CV: has chest  pain, no palpitations GI: no nausea, vomiting, abdominal pain, diarrhea, constipation GU: no dysuria, burning on urination, increased urinary frequency, hematuria  Ext: has trace leg edema Neuro: no unilateral weakness, numbness, or tingling, no vision change or hearing loss. Has dizziness and slurred speech. Skin: no rash, no skin tear. MSK: No muscle spasm, no deformity, no limitation of range of movement in spin Heme: No easy bruising.  Travel history: No recent long distant travel.  Allergy: No Known Allergies  Past Medical History:  Diagnosis Date  . CHF (congestive heart failure) ([place])   . Chronic cough   . COPD (chronic obstructive pulmonary disease) ([place])   . Elevated liver function tests   . Emphysema of lung ([place])   . Fibrosis, pulmonary, interstitial, diffuse ([place])   . GERD (gastroesophageal reflux disease)   . History of cocaine abuse ([place])   . Mediastinal lymphadenopathy   . Pulmonary fibrosis ([place])   . Right inguinal hernia     Past Surgical History:  Procedure Laterality Date  . COLONOSCOPY WITH PROPOFOL N/A 08/19/2018   Procedure: COLONOSCOPY WITH PROPOFOL;  Surgeon: Virgel Manifold, MD;  Location: [facility] ENDOSCOPY;  Service: Endoscopy;  Laterality: N/A;  . HERNIA REPAIR Right 1973   open  . INGUINAL HERNIA REPAIR Right 11/08/2014   Procedure: LAPAROSCOPIC RIGHT INGUINAL HERNIA REPAIR;  Surgeon: Hubbard Robinson, MD;  Location: [facility] ORS;  Service: General;  Laterality: Right;  . knee arthroscopy Right 1972  . RIGHT/LEFT HEART CATH AND CORONARY ANGIOGRAPHY Right 03/05/2017   Procedure: RIGHT/LEFT HEART CATH AND CORONARY ANGIOGRAPHY;  Surgeon: Dionisio David, MD;  Location: [facility];  Service: Cardiovascular;  Laterality: Right;    Social History:  reports that he has never smoked. He has never used smokeless tobacco. He reports previous alcohol use. He reports previous drug use. Drug: Cocaine.  Family History:  Family History  Problem Relation  Age of Onset  . Diabetes Mother   . Cancer Father   . AAA (abdominal aortic aneurysm) Brother      Prior to Admission medications   Medication Sig Start Date End Date Taking? Authorizing Provider  amiodarone (PACERONE) 200 MG tablet Take 200 mg by mouth daily.    [provider]  aspirin EC 81 MG tablet Take 325 mg by mouth daily. Swallow whole.    [provider]  atorvastatin (LIPITOR) 10 MG tablet Take 1 tablet (10 mg total) by mouth daily. 09/20/19   Steele Sizer, MD  budesonide-formoterol (SYMBICORT) 160-4.5 MCG/ACT inhaler Inhale 1-2 puffs into the lungs daily as needed (respiratory difficulty).     [provider]  furosemide (LASIX) 40 MG tablet Take 1 tablet (40 mg total) by mouth daily. 08/08/19   Hongalgi, Lenis Dickinson, MD  leflunomide (ARAVA) 10 MG tablet Take 10 mg by mouth daily.     [provider]  metoprolol succinate (TOPROL-XL) 50 MG 24 hr tablet Take 50 mg by mouth daily. Take with or immediately following a meal.    Neoma Laming A, MD  potassium chloride SA (KLOR-CON) 20 MEQ tablet Take 1 tablet (20 mEq total) by mouth daily. 12/20/18   Loletha Grayer, MD  predniSONE (DELTASONE) 5 MG tablet Take 2.5 mg by mouth daily.     Emmaline Kluver., MD  rivaroxaban (XARELTO) 20 MG TABS tablet Take 1 tablet (20 mg total) by mouth daily with supper. 08/07/19   Hongalgi, Lenis Dickinson, MD  spironolactone (ALDACTONE) 25 MG tablet Take 1 tablet (25 mg total) by mouth daily. 08/07/19   Hongalgi, Lenis Dickinson, MD  thiamine 100 MG tablet Take 1 tablet (100 mg total) by mouth daily. Every other day 06/19/19   Steele Sizer, MD  valsartan (DIOVAN) 80 MG tablet Take 80 mg by mouth daily.  04/14/19   [provider]    Physical Exam: Vitals:   12/08/19 1120 12/08/19 1130 12/08/19 1150 12/08/19 1200  BP: (!) 91/59 98/80 90/78  96/70  Pulse: 97 95 94 94  Resp: 18 (!) 21 (!) 36 (!) 42  SpO2: 97% 97% 91% 93%  Weight:      Height:       General: Not in  acute distress HEENT:       Eyes: PERRL, EOMI, no scleral icterus.       ENT: No discharge from the ears and nose, no pharynx injection, no tonsillar enlargement.        Neck: No JVD, no bruit, no mass felt. Heme: No neck lymph node enlargement. Cardiac: S1/S2, irregularly irregular rhythm, No murmurs, No gallops or rubs. Respiratory: Has rhonchi bilaterally GI: Soft, nondistended, nontender, no rebound pain, no organomegaly, BS present. GU: No hematuria Ext: has trace leg edema bilaterally. 1+DP/PT pulse bilaterally. Musculoskeletal: No joint deformities, No joint redness or warmth, no limitation of ROM in spin. Skin: No rashes.  Neuro: Alert, oriented X3, cranial nerves II-XII grossly intact, moves all extremities normally. Muscle strength 5/5 in all extremities, sensation to light touch intact. Has mild slurred speech Psych: Patient is not psychotic, no suicidal or hemocidal ideation.  Labs on Admission:  I have personally reviewed following labs and imaging studies  CBC: Recent Labs  Lab 12/08/19 0700  WBC 5.5  HGB 13.1  HCT 39.9  MCV 83.0  PLT 761   Basic Metabolic Panel: Recent Labs  Lab 12/08/19 0700  NA 132*  K 2.7*  CL 92*  CO2 28  GLUCOSE 109*  BUN 9  CREATININE 0.79  CALCIUM 8.4*  MG 1.9   GFR: Estimated Creatinine Clearance: 92.6 mL/min (by C-G formula based on SCr of 0.79 mg/dL). Liver Function Tests: No results for input(s): AST, ALT, ALKPHOS, BILITOT, PROT, ALBUMIN in the last 168 hours. No results for input(s): LIPASE, AMYLASE in the last 168 hours. No results for input(s): AMMONIA in the last 168 hours. Coagulation Profile: No results for input(s): INR, PROTIME in the last 168 hours. Cardiac Enzymes: No results for input(s): CKTOTAL, CKMB, CKMBINDEX, TROPONINI in the last 168 hours. BNP (last 3 results) No results for input(s): PROBNP in the last 8760 hours. HbA1C: No results for input(s): HGBA1C in the last 72 hours. CBG: No results for  input(s): GLUCAP in the last 168 hours. Lipid Profile: No results for input(s): CHOL, HDL, LDLCALC, TRIG, CHOLHDL, LDLDIRECT in the last 72 hours. Thyroid Function Tests: Recent Labs    12/08/19 0958  TSH 0.104*   Anemia Panel: No results for input(s): VITAMINB12, FOLATE, FERRITIN, TIBC, IRON, RETICCTPCT in the last 72 hours. Urine analysis:    Component Value Date/Time   COLORURINE AMBER (A) 12/17/2018 1437   APPEARANCEUR HAZY (A) 12/17/2018 1437   LABSPEC 1.014 12/17/2018 1437   PHURINE 6.0 12/17/2018 1437   GLUCOSEU NEGATIVE 12/17/2018 1437   HGBUR NEGATIVE 12/17/2018 1437   BILIRUBINUR NEGATIVE 12/17/2018 1437   BILIRUBINUR Negative 12/08/2017 0947   KETONESUR 20 (A) 12/17/2018 1437   PROTEINUR 30 (A) 12/17/2018 1437   UROBILINOGEN 1.0 12/08/2017 0947   NITRITE NEGATIVE 12/17/2018 1437   LEUKOCYTESUR NEGATIVE 12/17/2018 1437   Sepsis Labs: @LABRCNTIP (procalcitonin:4,lacticidven:4) ) Recent Results (from the past 240 hour(s))  Respiratory Panel by RT PCR (Flu A&B, Covid) - Nasopharyngeal Swab     Status: None   Collection Time: 12/08/19  7:47 AM   Specimen: Nasopharyngeal Swab  Result Value Ref Range Status   SARS Coronavirus 2 by RT PCR NEGATIVE NEGATIVE Final    Comment: (NOTE) SARS-CoV-2 target nucleic acids are NOT DETECTED.  The SARS-CoV-2 RNA is generally detectable in upper respiratoy specimens during the acute phase of infection. The lowest concentration of SARS-CoV-2 viral copies this assay can detect is 131 copies/mL. A negative result does not preclude SARS-Cov-2 infection and should not be used as the sole basis for treatment or other patient management decisions. A negative result may occur with  improper specimen collection/handling, submission of specimen other than nasopharyngeal swab, presence of viral mutation(s) within the areas targeted by this assay, and inadequate number of viral copies (<131 copies/mL). A negative result must be combined with  clinical observations, patient history, and epidemiological information. The expected result is Negative.  Fact Sheet for Patients:  PinkCheek.be  Fact Sheet for Healthcare Providers:  GravelBags.it  This test is no t yet approved or cleared by the [place] FDA and  has been authorized for detection and/or diagnosis of SARS-CoV-2 by FDA under an Emergency Use Authorization (EUA). This EUA will remain  in effect (meaning this test can be used) for the duration of the COVID-19 declaration under Section 564(b)(1) of the Act, 21 U.S.C. section 360bbb-3(b)(1), unless the authorization is terminated or revoked  sooner.     Influenza A by PCR NEGATIVE NEGATIVE Final   Influenza B by PCR NEGATIVE NEGATIVE Final    Comment: (NOTE) The Xpert Xpress SARS-CoV-2/FLU/RSV assay is intended as an aid in  the diagnosis of influenza from Nasopharyngeal swab specimens and  should not be used as a sole basis for treatment. Nasal washings and  aspirates are unacceptable for Xpert Xpress SARS-CoV-2/FLU/RSV  testing.  Fact Sheet for Patients: PinkCheek.be  Fact Sheet for Healthcare Providers: GravelBags.it  This test is not yet approved or cleared by the [place] FDA and  has been authorized for detection and/or diagnosis of SARS-CoV-2 by  FDA under an Emergency Use Authorization (EUA). This EUA will remain  in effect (meaning this test can be used) for the duration of the  Covid-19 declaration under Section 564(b)(1) of the Act, 21  U.S.C. section 360bbb-3(b)(1), unless the authorization is  terminated or revoked. Performed at [facility], [address], [place], [place] [address]      Radiological Exams on Admission: DG Chest 1 View  Result Date: 12/08/2019 CLINICAL DATA:  Chest pain. Shortness of breath. Interstitial fibrosis. EXAM: CHEST  1 VIEW  COMPARISON:  08/05/2019.  CT 12/17/2018, 03/30/2014. FINDINGS: Heart size normal. Diffuse severe bilateral pulmonary interstitial prominence again noted none consistent chronic interstitial lung disease. Interstitial prominence is increased from prior exam. Superimposed active process including pneumonitis cannot be excluded. No significant interim change. Stable mild basilar pleural thickening. No pneumothorax. IMPRESSION: Diffuse severe bilateral pulmonary interstitial prominence again noted consistent with known chronic interstitial lung disease. Interstitial prominence has increased from prior exam. Superimposed active process including pneumonitis cannot be excluded. Electronically Signed   By: Marcello Moores  Register   On: 12/08/2019 07:39   CT Head Wo Contrast  Result Date: 12/08/2019 CLINICAL DATA:  Slurred speech. Chest pain and shortness of breath with dizziness. EXAM: CT HEAD WITHOUT CONTRAST TECHNIQUE: Contiguous axial images were obtained from the base of the skull through the vertex without intravenous contrast. COMPARISON:  None. FINDINGS: Brain: No evidence of acute infarction, hemorrhage, hydrocephalus, extra-axial collection or mass lesion/mass effect. Vascular: No hyperdense vessel or unexpected calcification. Skull: Normal. Negative for fracture or focal lesion. Sinuses/Orbits: No acute finding. IMPRESSION: No acute finding or explanation for symptoms. Atherosclerosis. Electronically Signed   By: Monte Fantasia M.D.   On: 12/08/2019 07:41   MR BRAIN WO CONTRAST  Result Date: 12/08/2019 CLINICAL DATA:  Neuro deficit, acute stroke suspected. EXAM: MRI HEAD WITHOUT CONTRAST TECHNIQUE: Multiplanar, multiecho pulse sequences of the brain and surrounding structures were obtained without intravenous contrast. COMPARISON:  CT head from the same day. FINDINGS: Brain: No acute infarction, hemorrhage, hydrocephalus, extra-axial collection or mass lesion. Mild T2/FLAIR hyperintensities within the white  matter, compatible with chronic microvascular ischemic disease. Vascular: Major arterial flow voids are maintained at the skull base. Skull and upper cervical spine: Normal marrow signal. Sinuses/Orbits: The sinuses are clear.  Unremarkable orbits. Other: Small right mastoid effusion. IMPRESSION: No acute intracranial abnormality.  Specifically, no acute infarct. Electronically Signed   By: Margaretha Sheffield MD   On: 12/08/2019 11:13     EKG: I have personally reviewed.  Atrial fibrillation with RVR, low voltage, LAD, poor R wave progression, few beats of VT tach.  Assessment/Plan Principal Problem:   Atrial fibrillation with RVR (HCC) Active Problems:   HTN (hypertension)   Interstitial lung disease (HCC)   Chronic systolic CHF (congestive heart failure) (HCC)   Rheumatoid arthritis involving multiple sites with positive  rheumatoid factor (HCC)   History of alcoholism (HCC)   Hypokalemia   Pulmonary fibrosis (HCC)   Slurred speech   Chest pain   Elevated troponin   Atrial fibrillation with RVR ([place]): HR is up to 150s. This is most likely due to medication noncompliance. Patient is supposed to take amiodarone, metoprolol and Xarelto at home, but he did not take it this medications for several days.  -wil; place in aggressive bed for observation -Continue Cardizem drip -Continue home amiodarone and metoprolol -Continue Xarelto -check TSH -->low 0.104, will get free T4 and T3  Chest pain and elevated trop: trop 39. Also be due to demand ischemia secondary to A. fib with RVR. Dr. Humphrey Rolls of card is consulted. -Trend troponin -Aspirin, Lipitor, metoprolol -Check A1c, FLP, UDS -Repeat EKG in morning -f/u 2d echo.  HTN (hypertension) -As needed hydralazine -Metoprolol -Patient is on Lasix and spironolactone  Chronic systolic CHF (congestive heart failure): 2D echo on 08/05/2019 showed EF of 45-50%. Patient has elevated BNP 630, and trace leg edema. Initially his oxygen saturation is  98%, but later developed oxygen desaturation to upper 80s.  Patient seems to have CHF exacerbation. -Continue spironolactone - start IV lasix 40 mg bid -start BiPAP since pt has tachypnea with RR 30s-40s  Interstitial lung disease and pulmonary fibrosis  -Bronchodilators  History of alcoholism (HCC) -CIWA protocol  Hypokalemia: K=2.7 on admission. - Repleted - Check Mg level - Give 1 g of magnesium sulfate  Slurred speech: -MRI-brain is negative for stroke.  Rheumatoid arthritis involving multiple sites with positive rheumatoid factor: -continue leflunomide -increase prednisone dose from 2.5 to 5 mg daily -Give Solu-Cortef 50 mg stress dose    DVT ppx: on Xarelto Code Status: Full code Family Communication:   Yes, patient's sister at bed side Disposition Plan:  Anticipate discharge back to previous environment Consults called: Dr. Humphrey Rolls of card Admission status:   progressive unit for obs   Status is: Observation  The patient remains OBS appropriate and will d/c before 2 midnights.  Dispo: The patient is from: Home              Anticipated d/c is to: Home              Anticipated d/c date is: 1 day              Patient currently is not medically stable to d/c.           Date of Service 12/08/2019    Ivor Costa [facility]   If 7PM-7AM, please contact night-coverage [email or web address] 12/08/2019, 12:31 PM

## 2019-12-08 NOTE — ED Notes (Signed)
Pt removed bi pap. Refusing to place mask back on. MD notified

## 2019-12-08 NOTE — ED Notes (Signed)
Pt returned from CT °

## 2019-12-08 NOTE — ED Notes (Signed)
Pt given crackers with peanut butter and ginger ale.

## 2019-12-08 NOTE — ED Notes (Signed)
Entered room to find patient had taken Bi-Pap off. Patient agreed to bi-pap being placed back onto patient

## 2019-12-08 NOTE — ED Notes (Signed)
Pt taken to CT by Suezanne Jacquet, [place]

## 2019-12-08 NOTE — ED Notes (Signed)
Pt refusing to wear [place], shouting "I don't need it, I ain't wearing it. I need to fix my heart."

## 2019-12-08 NOTE — ED Notes (Signed)
Pt offered nasal cannula until MD can come speak to him, pt refused stating "I don't care what my oxygen is"

## 2019-12-08 NOTE — ED Notes (Signed)
Pt placed on bipap  

## 2019-12-08 NOTE — ED Notes (Signed)
NP at bedside.

## 2019-12-08 NOTE — Consult Note (Signed)
Jason Fitzgerald is a 69 y.o. male  [id number]  Primary Cardiologist: [place] Reason for Consultation: CHF  HPI:68 YOBM with h/o alcohol abuse and CHF, presented with severe SOB.   Review of Systems: has orthopnea   Past Medical History:  Diagnosis Date  . CHF (congestive heart failure) ([place])   . Chronic cough   . COPD (chronic obstructive pulmonary disease) ([place])   . Elevated liver function tests   . Emphysema of lung ([place])   . Fibrosis, pulmonary, interstitial, diffuse ([place])   . GERD (gastroesophageal reflux disease)   . History of cocaine abuse ([place])   . Mediastinal lymphadenopathy   . Pulmonary fibrosis ([place])   . Right inguinal hernia     (Not in a hospital admission)    . amiodarone  200 mg Oral Daily  . folic acid  1 mg Oral Daily  . furosemide  40 mg Intravenous Q12H  . ipratropium  2 puff Inhalation Q4H  . LORazepam  0-4 mg Intravenous Q6H   Followed by  . [START ON 12/10/2019] LORazepam  0-4 mg Intravenous Q12H  . multivitamin with minerals  1 tablet Oral Daily  . rivaroxaban  20 mg Oral Q supper  . thiamine  100 mg Oral Daily   Or  . thiamine  100 mg Intravenous Daily    Infusions: . diltiazem (CARDIZEM) infusion 10 mg/hr (12/08/19 0738)    No Known Allergies  Social History   Socioeconomic History  . Marital status: Divorced    Spouse name: Not on file  . Number of children: 4  . Years of education: Not on file  . Highest education level: Not on file  Occupational History  . Occupation: retired   Tobacco Use  . Smoking status: Never Smoker  . Smokeless tobacco: Never Used  Vaping Use  . Vaping Use: Never used  Substance and Sexual Activity  . Alcohol use: Not Currently    Alcohol/week: 0.0 standard drinks    Comment: 2 quarts a week, he quit again 12/2018  . Drug use: Not Currently    Types: Cocaine    Comment: as an young adult   . Sexual activity: Not Currently  Other Topics Concern  . Not on file  Social History  Narrative   Retired Summer of 2019   Lives alone   He had 4 children. One son was killed years ago, but has 3 living children    Social Determinants of Health   Financial Resource Strain: Low Risk   . Difficulty of Paying Living Expenses: Not very hard  Food Insecurity: No Food Insecurity  . Worried About Charity fundraiser in the Last Year: Never true  . Ran Out of Food in the Last Year: Never true  Transportation Needs: No Transportation Needs  . Lack of Transportation (Medical): No  . Lack of Transportation (Non-Medical): No  Physical Activity: Inactive  . Days of Exercise per Week: 0 days  . Minutes of Exercise per Session: 0 min  Stress: No Stress Concern Present  . Feeling of Stress : Not at all  Social Connections: Socially Isolated  . Frequency of Communication with Friends and Family: Once a week  . Frequency of Social Gatherings with Friends and Family: Once a week  . Attends Religious Services: Never  . Active Member of Clubs or Organizations: No  . Attends Archivist Meetings: Never  . Marital Status: Divorced  Human resources officer Violence: Not At Risk  . Fear  of Current or Ex-Partner: No  . Emotionally Abused: No  . Physically Abused: No  . Sexually Abused: No    Family History  Problem Relation Age of Onset  . Diabetes Mother   . Cancer Father   . AAA (abdominal aortic aneurysm) Brother     PHYSICAL EXAM: Vitals:   12/08/19 1317 12/08/19 1318  BP:    Pulse: 97 95  Resp: (!) 28 (!) 28  SpO2: (!) 87% 94%     Intake/Output Summary (Last 24 hours) at 12/08/2019 1324 Last data filed at 12/08/2019 0953 Gross per 24 hour  Intake 1098.45 ml  Output --  Net 1098.45 ml    General:  Well appearing. No respiratory difficulty HEENT: normal Neck: supple. no JVD. Carotids 2+ bilat; no bruits. No lymphadenopathy or thryomegaly appreciated. Cor: PMI nondisplaced. Regular rate & rhythm. No rubs, gallops or murmurs. Lungs: clear Abdomen: soft,  nontender, nondistended. No hepatosplenomegaly. No bruits or masses. Good bowel sounds. Extremities: no cyanosis, clubbing, rash, edema Neuro: alert & oriented x 3, cranial nerves grossly intact. moves all 4 extremities w/o difficulty. Affect pleasant.  ECG: NSR LVH  Results for orders placed or performed during the hospital encounter of 12/08/19 (from the past 24 hour(s))  Basic metabolic panel     Status: Abnormal   Collection Time: 12/08/19  7:00 AM  Result Value Ref Range   Sodium 132 (L) 135 - 145 mmol/L   Potassium 2.7 (LL) 3.5 - 5.1 mmol/L   Chloride 92 (L) 98 - 111 mmol/L   CO2 28 22 - 32 mmol/L   Glucose, Bld 109 (H) 70 - 99 mg/dL   BUN 9 8 - 23 mg/dL   Creatinine, Ser 0.79 0.61 - 1.24 mg/dL   Calcium 8.4 (L) 8.9 - 10.3 mg/dL   GFR, Estimated >60 >60 mL/min   Anion gap 12 5 - 15  Magnesium     Status: None   Collection Time: 12/08/19  7:00 AM  Result Value Ref Range   Magnesium 1.9 1.7 - 2.4 mg/dL  CBC     Status: None   Collection Time: 12/08/19  7:00 AM  Result Value Ref Range   WBC 5.5 4.0 - 10.5 K/uL   RBC 4.81 4.22 - 5.81 MIL/uL   Hemoglobin 13.1 13.0 - 17.0 g/dL   HCT 39.9 39 - 52 %   MCV 83.0 80.0 - 100.0 fL   MCH 27.2 26.0 - 34.0 pg   MCHC 32.8 30.0 - 36.0 g/dL   RDW 15.0 11.5 - 15.5 %   Platelets 284 150 - 400 K/uL   nRBC 0.0 0.0 - 0.2 %  Brain natriuretic peptide (IF shortness of breath has been documented this visit)     Status: Abnormal   Collection Time: 12/08/19  7:00 AM  Result Value Ref Range   B Natriuretic Peptide 630.1 (H) 0.0 - 100.0 pg/mL  Respiratory Panel by RT PCR (Flu A&B, Covid) - Nasopharyngeal Swab     Status: None   Collection Time: 12/08/19  7:47 AM   Specimen: Nasopharyngeal Swab  Result Value Ref Range   SARS Coronavirus 2 by RT PCR NEGATIVE NEGATIVE   Influenza A by PCR NEGATIVE NEGATIVE   Influenza B by PCR NEGATIVE NEGATIVE  Troponin I (High Sensitivity)     Status: Abnormal   Collection Time: 12/08/19  9:58 AM  Result  Value Ref Range   Troponin I (High Sensitivity) 39 (H) <18 ng/L  TSH     Status: Abnormal  Collection Time: 12/08/19  9:58 AM  Result Value Ref Range   TSH 0.104 (L) 0.350 - 4.500 uIU/mL   DG Chest 1 View  Result Date: 12/08/2019 CLINICAL DATA:  Chest pain. Shortness of breath. Interstitial fibrosis. EXAM: CHEST  1 VIEW COMPARISON:  08/05/2019.  CT 12/17/2018, 03/30/2014. FINDINGS: Heart size normal. Diffuse severe bilateral pulmonary interstitial prominence again noted none consistent chronic interstitial lung disease. Interstitial prominence is increased from prior exam. Superimposed active process including pneumonitis cannot be excluded. No significant interim change. Stable mild basilar pleural thickening. No pneumothorax. IMPRESSION: Diffuse severe bilateral pulmonary interstitial prominence again noted consistent with known chronic interstitial lung disease. Interstitial prominence has increased from prior exam. Superimposed active process including pneumonitis cannot be excluded. Electronically Signed   By: Marcello Moores  Register   On: 12/08/2019 07:39   CT Head Wo Contrast  Result Date: 12/08/2019 CLINICAL DATA:  Slurred speech. Chest pain and shortness of breath with dizziness. EXAM: CT HEAD WITHOUT CONTRAST TECHNIQUE: Contiguous axial images were obtained from the base of the skull through the vertex without intravenous contrast. COMPARISON:  None. FINDINGS: Brain: No evidence of acute infarction, hemorrhage, hydrocephalus, extra-axial collection or mass lesion/mass effect. Vascular: No hyperdense vessel or unexpected calcification. Skull: Normal. Negative for fracture or focal lesion. Sinuses/Orbits: No acute finding. IMPRESSION: No acute finding or explanation for symptoms. Atherosclerosis. Electronically Signed   By: Monte Fantasia M.D.   On: 12/08/2019 07:41   MR BRAIN WO CONTRAST  Result Date: 12/08/2019 CLINICAL DATA:  Neuro deficit, acute stroke suspected. EXAM: MRI HEAD WITHOUT  CONTRAST TECHNIQUE: Multiplanar, multiecho pulse sequences of the brain and surrounding structures were obtained without intravenous contrast. COMPARISON:  CT head from the same day. FINDINGS: Brain: No acute infarction, hemorrhage, hydrocephalus, extra-axial collection or mass lesion. Mild T2/FLAIR hyperintensities within the white matter, compatible with chronic microvascular ischemic disease. Vascular: Major arterial flow voids are maintained at the skull base. Skull and upper cervical spine: Normal marrow signal. Sinuses/Orbits: The sinuses are clear.  Unremarkable orbits. Other: Small right mastoid effusion. IMPRESSION: No acute intracranial abnormality.  Specifically, no acute infarct. Electronically Signed   By: Margaretha Sheffield MD   On: 12/08/2019 11:13     ASSESSMENT AND PLAN: CHF due to alcohol abuse and non compliance las LVEF 45-50% CHF due to reduced EF. Continue IV lasix. Continue current meds. Myranda Pavone A

## 2019-12-08 NOTE — ED Notes (Signed)
Pt returned from MRI °

## 2019-12-08 NOTE — ED Notes (Signed)
Pt states that potassium is burning his arm, rate decreased to 75 ml/hr for comfort. Will continue to monitor patient.

## 2019-12-08 NOTE — ED Notes (Signed)
Pt continues to c/o "heart beating to fast." Continually stating he feels like hes having a "stroke or a heart attack." HR sustained in 110's, SBP to low to titrate diltiazem. Respirations 40 times/minute. MD notified.

## 2019-12-08 NOTE — ED Notes (Signed)
Admitting MD at bedside.

## 2019-12-08 NOTE — Progress Notes (Signed)
Called to place patient on HFNC as he is not tolerating the bipap and cannot eat with nrb.  RR noted in 30s with sats in upper 90's. On 35%45L will continue to monitor

## 2019-12-08 NOTE — ED Notes (Signed)
Pt states "I'm not going to hit this inhaler now. I take it twice a day." Patient informed MD ordered inhaler every 4 hours while in hospital. Pt states "I'll hit it when I'm ready to hit it."

## 2019-12-08 NOTE — ED Notes (Signed)
Pt given box lunch.

## 2019-12-08 NOTE — TOC Initial Note (Addendum)
Transition of Care [facility]) - Initial/Assessment Note    Patient Details  Name: Jason Fitzgerald MRN: [id number] Date of Birth: 1950-04-30  [facility]) CM/SW Contact:    Ova Freshwater Phone Number: [phone] 12/08/2019, 4:21 PM  Clinical Narrative:                  CSW spoke with patient and explained role of TOC in patient care.  Patient stated he hasn't been taken care of and is hungry and hasn't eaten since he's been in the hospital.  CSW stated there may be a medical reason why he hasn't been allowed to eat but that I would let the RN know he was hungry.  Patient stated he is able to perform all ADLs, he has a PCP but cannot remember their name.  Patient also stated he drinks one 24 ox beer everyday.  Patient again mentioned how no one was taking care of him and that he was hungry.  CSW verbalized understanding.,  CSW spoke about substance use/abuse resources list and left it in patient's table.  Patient stated he didn't think he needed it.  Patient stated he does not want SNF even if it is recommended, "I am going back home.", he is willing to go home with home health.  Expected Discharge Plan: Home/Self Care Barriers to Discharge: Continued Medical Work up   Patient Goals and CMS Choice Patient states their goals for this hospitalization and ongoing recovery are:: "No one is taking care of me. I've been here since six and I haven't had anything to eat."      Expected Discharge Plan and Services Expected Discharge Plan: Home/Self Care In-house Referral: Clinical Social Work     Living arrangements for the past 2 months: Single Family Home                                      Prior Living Arrangements/Services Living arrangements for the past 2 months: Single Family Home Lives with:: Self Patient language and need for interpreter reviewed:: Yes Do you feel safe going back to the place where you live?: Yes      Need for Family Participation  in Patient Care: Yes (Comment) Care giver support system in place?: Yes (comment)   Criminal Activity/Legal Involvement Pertinent to Current Situation/Hospitalization: No - Comment as needed  Activities of Daily Living      Permission Sought/Granted      Share Information with NAME: Heidi Dach (Sister) [phone]           Emotional Assessment Appearance:: Appears stated age Attitude/Demeanor/Rapport: Angry Affect (typically observed): Angry Orientation: : Oriented to Self, Oriented to Place, Oriented to Situation Alcohol / Substance Use: Alcohol Use ("I drink one 24 oz beer every day.") Psych Involvement: No (comment)  Admission diagnosis:  Atrial fibrillation with RVR ([place]) [I48.91] Patient Active Problem List   Diagnosis Date Noted  . Atrial fibrillation with RVR ([place]) 12/08/2019  . Slurred speech 12/08/2019  . Chest pain 12/08/2019  . Elevated troponin 12/08/2019  . Acute CHF (congestive heart failure) ([place]) 08/05/2019  . Diarrhea   . Dizziness 12/17/2018  . Hypotension 12/17/2018  . Hypophosphatemia 12/17/2018  . Hypomagnesemia 12/17/2018  . Positive D-dimer 12/17/2018  . Abnormal LFTs (liver function tests) 12/17/2018  . Anemia 12/17/2018  . Pulmonary fibrosis ([place])   . GERD (gastroesophageal reflux disease)   . COPD (chronic  obstructive pulmonary disease) ([place])   . CHF (congestive heart failure) ([place])   . Encounter for screening colonoscopy   . Polyp of transverse colon   . Internal hemorrhoids   . Hypokalemia 02/03/2018  . History of alcoholism ([place]) 12/08/2017  . Rheumatoid arthritis involving multiple sites with positive rheumatoid factor ([place]) 10/13/2017  . Chronic systolic CHF (congestive heart failure) ([place]) 03/12/2017  . AF (paroxysmal atrial fibrillation) ([place]) 03/12/2017  . Arthritis of left knee 08/01/2016  . 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 Chronic cough 05/29/2015  . CAFL (chronic airflow limitation) ([place])  10/24/2014  . Arteriosclerosis of coronary artery 10/24/2014  . History of fundoplication 56/31/4970  . HTN (hypertension) 10/24/2014  . LAD (lymphadenopathy), mediastinal 07/13/2013  . Interstitial lung disease ([place]) 06/19/2013  . Postinflammatory pulmonary fibrosis ([place]) 06/15/2013   PCP:  Steele Sizer, MD Pharmacy:   [id number] - [place], [place] - [address] [address] Phone: [phone] Fax: [phone]     Social Determinants of Health (SDOH) Interventions    Readmission Risk Interventions No flowsheet data found.

## 2019-12-08 NOTE — ED Notes (Addendum)
Pt argumentative, telling RN that he doesn't need bi-pap. Stating "You don't know what you're talking about, I don't need this mask. Where's the doctor at? I need to talk to him. I've been here for this twice and ain't nothing getting better." MD notified  Pt continues to be non-compliant with bipap  Refusing to answer questions for CIWA

## 2019-12-08 NOTE — ED Notes (Signed)
Entered room to find pt had removed NRB, refusing to replace. Admit team notified

## 2019-12-09 ENCOUNTER — Observation Stay (HOSPITAL_COMMUNITY)
Admission: EM | Admit: 2019-12-09 | Discharge: 2019-12-09 | Disposition: A | Discharge status: Home / Self Care | Payer: Medicare Other | Source: Home / Self Care | Attending: Internal Medicine | Admitting: Internal Medicine

## 2019-12-09 ENCOUNTER — Observation Stay: Admission: EM | Admit: 2019-12-09 | Payer: Medicare Other | Source: Home / Self Care | Admitting: Internal Medicine

## 2019-12-09 ENCOUNTER — Encounter: Payer: Self-pay | Admitting: Internal Medicine

## 2019-12-09 ENCOUNTER — Observation Stay: Payer: Medicare Other

## 2019-12-09 DIAGNOSIS — F1020 Alcohol dependence, uncomplicated: Secondary | ICD-10-CM | POA: Diagnosis present

## 2019-12-09 DIAGNOSIS — Z7952 Long term (current) use of systemic steroids: Secondary | ICD-10-CM | POA: Diagnosis not present

## 2019-12-09 DIAGNOSIS — J9600 Acute respiratory failure, unspecified whether with hypoxia or hypercapnia: Secondary | ICD-10-CM | POA: Diagnosis not present

## 2019-12-09 DIAGNOSIS — Z7951 Long term (current) use of inhaled steroids: Secondary | ICD-10-CM | POA: Diagnosis not present

## 2019-12-09 DIAGNOSIS — J90 Pleural effusion, not elsewhere classified: Secondary | ICD-10-CM | POA: Diagnosis not present

## 2019-12-09 DIAGNOSIS — R079 Chest pain, unspecified: Secondary | ICD-10-CM

## 2019-12-09 DIAGNOSIS — Z20822 Contact with and (suspected) exposure to covid-19: Secondary | ICD-10-CM | POA: Diagnosis present

## 2019-12-09 DIAGNOSIS — I4891 Unspecified atrial fibrillation: Secondary | ICD-10-CM | POA: Diagnosis not present

## 2019-12-09 DIAGNOSIS — Z7901 Long term (current) use of anticoagulants: Secondary | ICD-10-CM | POA: Diagnosis not present

## 2019-12-09 DIAGNOSIS — I5033 Acute on chronic diastolic (congestive) heart failure: Secondary | ICD-10-CM | POA: Diagnosis not present

## 2019-12-09 DIAGNOSIS — R4781 Slurred speech: Secondary | ICD-10-CM | POA: Diagnosis present

## 2019-12-09 DIAGNOSIS — J441 Chronic obstructive pulmonary disease with (acute) exacerbation: Secondary | ICD-10-CM | POA: Diagnosis not present

## 2019-12-09 DIAGNOSIS — J8410 Pulmonary fibrosis, unspecified: Secondary | ICD-10-CM | POA: Diagnosis not present

## 2019-12-09 DIAGNOSIS — I2699 Other pulmonary embolism without acute cor pulmonale: Secondary | ICD-10-CM | POA: Diagnosis not present

## 2019-12-09 DIAGNOSIS — J9601 Acute respiratory failure with hypoxia: Secondary | ICD-10-CM | POA: Diagnosis not present

## 2019-12-09 DIAGNOSIS — Z79899 Other long term (current) drug therapy: Secondary | ICD-10-CM | POA: Diagnosis not present

## 2019-12-09 DIAGNOSIS — E785 Hyperlipidemia, unspecified: Secondary | ICD-10-CM | POA: Diagnosis present

## 2019-12-09 DIAGNOSIS — R002 Palpitations: Secondary | ICD-10-CM | POA: Diagnosis present

## 2019-12-09 DIAGNOSIS — I420 Dilated cardiomyopathy: Secondary | ICD-10-CM | POA: Diagnosis not present

## 2019-12-09 DIAGNOSIS — Z833 Family history of diabetes mellitus: Secondary | ICD-10-CM | POA: Diagnosis not present

## 2019-12-09 DIAGNOSIS — I5023 Acute on chronic systolic (congestive) heart failure: Secondary | ICD-10-CM

## 2019-12-09 DIAGNOSIS — Z7982 Long term (current) use of aspirin: Secondary | ICD-10-CM | POA: Diagnosis not present

## 2019-12-09 DIAGNOSIS — I340 Nonrheumatic mitral (valve) insufficiency: Secondary | ICD-10-CM | POA: Diagnosis not present

## 2019-12-09 DIAGNOSIS — I248 Other forms of acute ischemic heart disease: Secondary | ICD-10-CM | POA: Diagnosis present

## 2019-12-09 DIAGNOSIS — I110 Hypertensive heart disease with heart failure: Secondary | ICD-10-CM | POA: Diagnosis present

## 2019-12-09 DIAGNOSIS — J449 Chronic obstructive pulmonary disease, unspecified: Secondary | ICD-10-CM | POA: Diagnosis not present

## 2019-12-09 DIAGNOSIS — M0579 Rheumatoid arthritis with rheumatoid factor of multiple sites without organ or systems involvement: Secondary | ICD-10-CM | POA: Diagnosis present

## 2019-12-09 DIAGNOSIS — I159 Secondary hypertension, unspecified: Secondary | ICD-10-CM | POA: Diagnosis present

## 2019-12-09 DIAGNOSIS — E876 Hypokalemia: Secondary | ICD-10-CM | POA: Diagnosis present

## 2019-12-09 DIAGNOSIS — I351 Nonrheumatic aortic (valve) insufficiency: Secondary | ICD-10-CM

## 2019-12-09 DIAGNOSIS — R0600 Dyspnea, unspecified: Secondary | ICD-10-CM | POA: Diagnosis not present

## 2019-12-09 DIAGNOSIS — Z809 Family history of malignant neoplasm, unspecified: Secondary | ICD-10-CM | POA: Diagnosis not present

## 2019-12-09 DIAGNOSIS — F1010 Alcohol abuse, uncomplicated: Secondary | ICD-10-CM | POA: Diagnosis present

## 2019-12-09 DIAGNOSIS — I509 Heart failure, unspecified: Secondary | ICD-10-CM | POA: Diagnosis not present

## 2019-12-09 DIAGNOSIS — J84112 Idiopathic pulmonary fibrosis: Secondary | ICD-10-CM | POA: Diagnosis present

## 2019-12-09 DIAGNOSIS — K219 Gastro-esophageal reflux disease without esophagitis: Secondary | ICD-10-CM | POA: Diagnosis present

## 2019-12-09 DIAGNOSIS — I480 Paroxysmal atrial fibrillation: Secondary | ICD-10-CM | POA: Diagnosis present

## 2019-12-09 LAB — CBC
HCT: 36.9 % — ABNORMAL LOW (ref 39.0–52.0)
Hemoglobin: 12 g/dL — ABNORMAL LOW (ref 13.0–17.0)
MCH: 27.1 pg (ref 26.0–34.0)
MCHC: 32.5 g/dL (ref 30.0–36.0)
MCV: 83.3 fL (ref 80.0–100.0)
Platelets: 256 10*3/uL (ref 150–400)
RBC: 4.43 MIL/uL (ref 4.22–5.81)
RDW: 15.2 % (ref 11.5–15.5)
WBC: 7 10*3/uL (ref 4.0–10.5)
nRBC: 0 % (ref 0.0–0.2)

## 2019-12-09 LAB — ECHOCARDIOGRAM COMPLETE
Calc EF: 26.1 %
Height: 69 in
MV M vel: 3.91 m/s
MV Peak grad: 61.1 mmHg
S' Lateral: 4.79 cm
Single Plane A2C EF: 28.7 %
Single Plane A4C EF: 26.7 %
Weight: 3086.44 oz

## 2019-12-09 LAB — LIPID PANEL
Cholesterol: 111 mg/dL (ref 0–200)
HDL: 41 mg/dL (ref >40)
LDL Cholesterol: 57 mg/dL (ref 0–99)
Total CHOL/HDL Ratio: 2.7 RATIO
Triglycerides: 66 mg/dL (ref <150)
VLDL: 13 mg/dL (ref 0–40)

## 2019-12-09 LAB — T4, FREE: Free T4: 1.81 ng/dL — ABNORMAL HIGH (ref 0.61–1.12)

## 2019-12-09 LAB — BASIC METABOLIC PANEL
Anion gap: 10 (ref 5–15)
BUN: 16 mg/dL (ref 8–23)
CO2: 25 mmol/L (ref 22–32)
Calcium: 7.8 mg/dL — ABNORMAL LOW (ref 8.9–10.3)
Chloride: 100 mmol/L (ref 98–111)
Creatinine, Ser: 1 mg/dL (ref 0.61–1.24)
GFR, Estimated: >60 mL/min (ref >60)
Glucose, Bld: 160 mg/dL — ABNORMAL HIGH (ref 70–99)
Potassium: 3.5 mmol/L (ref 3.5–5.1)
Sodium: 135 mmol/L (ref 135–145)

## 2019-12-09 LAB — HEMOGLOBIN A1C
Hgb A1c MFr Bld: 5.5 % (ref 4.8–5.6)
Mean Plasma Glucose: 111.15 mg/dL

## 2019-12-09 LAB — FIBRIN DERIVATIVES D-DIMER (ARMC ONLY): Fibrin derivatives D-dimer (ARMC): 2109.91 ng/mL (FEU) — ABNORMAL HIGH (ref 0.00–499.00)

## 2019-12-09 MED ORDER — SACUBITRIL-VALSARTAN 24-26 MG PO TABS
1.0000 | ORAL_TABLET | Freq: Two times a day (BID) | ORAL | Status: DC
  Administered 2019-12-10 – 2019-12-13 (×7): 1 via ORAL
  Filled 2019-12-09 (×7): qty 1

## 2019-12-09 MED ORDER — ALBUMIN HUMAN 25 % IV SOLN
25.0000 g | Freq: Once | INTRAVENOUS | Status: AC
  Administered 2019-12-09: 25 g via INTRAVENOUS
  Filled 2019-12-09: qty 100

## 2019-12-09 MED ORDER — SODIUM CHLORIDE 0.9 % IV SOLN
INTRAVENOUS | Status: DC | PRN
  Administered 2019-12-09: 500 mL via INTRAVENOUS

## 2019-12-09 MED ORDER — ALBUMIN HUMAN 25 % IV SOLN
25.0000 g | Freq: Four times a day (QID) | INTRAVENOUS | Status: DC
  Administered 2019-12-09 – 2019-12-13 (×15): 25 g via INTRAVENOUS
  Filled 2019-12-09 (×14): qty 100

## 2019-12-09 MED ORDER — FUROSEMIDE 10 MG/ML IJ SOLN
20.0000 mg/h | INTRAVENOUS | Status: DC
  Administered 2019-12-09: 10 mg/h via INTRAVENOUS
  Administered 2019-12-10 – 2019-12-11 (×3): 20 mg/h via INTRAVENOUS
  Filled 2019-12-09 (×4): qty 20

## 2019-12-09 MED ORDER — IOHEXOL 350 MG/ML SOLN
75.0000 mL | Freq: Once | INTRAVENOUS | Status: AC | PRN
  Administered 2019-12-09: 75 mL via INTRAVENOUS

## 2019-12-09 MED ORDER — SODIUM CHLORIDE 0.9 % IV BOLUS
250.0000 mL | Freq: Once | INTRAVENOUS | Status: AC
  Administered 2019-12-09: 250 mL via INTRAVENOUS

## 2019-12-09 NOTE — Progress Notes (Addendum)
SUBJECTIVE: Patient resting in bed.  States he is still having difficulty breathing but currently denies chest pain.   Vitals:   12/09/19 1150 12/09/19 1155 12/09/19 1225 12/09/19 1400  BP:   100/87 (!) 107/50  Pulse: (!) 107 92 96 93  Resp: (!) 29 (!) 32 (!) 31 (!) 37  Temp:      TempSrc:      SpO2: 99% 99% 100% 100%  Weight:      Height:        Intake/Output Summary (Last 24 hours) at 12/09/2019 1521 Last data filed at 12/09/2019 1005 Gross per 24 hour  Intake 360 ml  Output 200 ml  Net 160 ml    LABS: Basic Metabolic Panel: Recent Labs    12/08/19 0700 12/09/19 0414  NA 132* 135  K 2.7* 3.5  CL 92* 100  CO2 28 25  GLUCOSE 109* 160*  BUN 9 16  CREATININE 0.79 1.00  CALCIUM 8.4* 7.8*  MG 1.9  --    Liver Function Tests: No results for input(s): AST, ALT, ALKPHOS, BILITOT, PROT, ALBUMIN in the last 72 hours. No results for input(s): LIPASE, AMYLASE in the last 72 hours. CBC: Recent Labs    12/08/19 0700 12/09/19 0414  WBC 5.5 7.0  HGB 13.1 12.0*  HCT 39.9 36.9*  MCV 83.0 83.3  PLT 284 256   Cardiac Enzymes: No results for input(s): CKTOTAL, CKMB, CKMBINDEX, TROPONINI in the last 72 hours. BNP: Invalid input(s): POCBNP D-Dimer: No results for input(s): DDIMER in the last 72 hours. Hemoglobin A1C: Recent Labs    12/08/19 0700  HGBA1C 5.5   Fasting Lipid Panel: Recent Labs    12/09/19 0414  CHOL 111  HDL 41  LDLCALC 57  TRIG 66  CHOLHDL 2.7   Thyroid Function Tests: Recent Labs    12/08/19 0958  TSH 0.104*   Anemia Panel: No results for input(s): VITAMINB12, FOLATE, FERRITIN, TIBC, IRON, RETICCTPCT in the last 72 hours.   PHYSICAL EXAM General: Well developed, well nourished. HEENT:  Normocephalic and atramatic Neck:  No JVD.  Lungs: Coarse crackles bibasilar.  Tachypneic Heart: HRRR . Normal S1 and S2 without gallops or murmurs.  Abdomen: Bowel sounds are positive, abdomen soft and non-tender  Msk:  Back normal, normal gait.  Normal strength and tone for age. Extremities: No clubbing, cyanosis or edema.   Neuro: Alert and oriented X 3. Psych:  Good affect, responds appropriately  TELEMETRY: NSR 91/BPM  ASSESSMENT AND PLAN: Patient presenting to the emergency department with chest pain and shortness of breath after being noncompliant with medications for 1 week.  Patient has converted back to normal sinus rhythm and Cardizem infusion was stopped overnight.  Please continue amiodarone and Xarelto.  Patient continues to have dyspnea in the setting of HFrEF exacerbation and pulmonary fibrosis.  Continue supplemental oxygen and wean as tolerated. Echocardiogram reveals ejection fraction of 25 to 30% with moderately reduced RV systolic function, moderately elevated PA systolic pressure, mild to moderate MR, and mild AI. 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 Please continue metoprolol for heart rate control.  Patient mildly hypotensive.  Please continue spironolactone as BP allows and will switch irbesartan with Entresto.  Recommend continuing furosemide 40 mg IV twice daily.  We will continue to follow  Principal Problem:   Atrial fibrillation with RVR ([place]) Active Problems:   HTN (hypertension)   Interstitial lung disease ([facility])   Chronic systolic CHF (congestive heart failure) (  [place])   Rheumatoid arthritis involving multiple sites with positive rheumatoid factor ([place])   History of alcoholism ([place])   Hypokalemia   Pulmonary fibrosis ([facility])   Slurred speech   Chest pain   Elevated troponin   Atrial fibrillation ([place])    Jason Sill, NP-C 12/09/2019 3:21 PM

## 2019-12-09 NOTE — Progress Notes (Signed)
PROGRESS NOTE    Jason Fitzgerald  GLO:[id number] DOB: May 06, 1950 DOA: 12/08/2019 PCP: Steele Sizer, MD   Assessment & Plan:   Principal Problem:   Atrial fibrillation with RVR ([place]) Active Problems:   HTN (hypertension)   Interstitial lung disease ([place])   Chronic systolic CHF (congestive heart failure) (HCC)   Rheumatoid arthritis involving multiple sites with positive rheumatoid factor (HCC)   History of alcoholism ([place])   Hypokalemia   Pulmonary fibrosis (HCC)   Slurred speech   Chest pain   Elevated troponin   Atrial fibrillation: w/ RVR. Most likely due to medication noncompliance. Continue on home dose of amiodarone, xarelto, metoprolol. Continue on tele   Chest pain: w/ elevated troponins which likely secondary to demand ischemia from rapid a. fib. Cardio consulted. Echo pending   HTN: continue on metoprolol, spironolactone, irbesartan. IV hydralazine prn   Acute on chronic systolic CHF: echo on 02/09/8414 showed EF of 45-50%. Continue on IV lasix, spironolactone, irbesartan    Acute hypoxic respiratory failure: likely secondary to CHF & pulmonary fibrosis. Continue on supplemental oxygen and wean as tolerated, currently on HFNC.   Interstitial lung disease and pulmonary fibrosis: continue on bronchodilators and encourage incentive spirometry. Pulmon consulted, Dr. Raul Del   History of alcoholism: continue on CIWA protocol   Hypokalemia: WNL today. Will continue to monitor    Slurred speech: MRI brain neg for CVA   Rheumatoid arthritis: continue on home dose of leflunomide. Continue on increased dose of prednisone     DVT prophylaxis: xarelto Code Status: full  Family Communication: Disposition Plan:   Status is: Inpatient  Remains inpatient appropriate because:Ongoing diagnostic testing needed not appropriate for outpatient work up, Unsafe d/c plan and IV treatments appropriate due to intensity of illness or inability to take PO   Dispo: The  patient is from: Home              Anticipated d/c is to: Home              Anticipated d/c date is: 3 days              Patient currently is not medically stable to d/c.          Consultants:   Cardio  pulmon   Procedures:    Antimicrobials:    Subjective: Pt c/o shortness of breath   Objective: Vitals:   12/09/19 0410 12/09/19 0455 12/09/19 0607 12/09/19 0644  BP: (!) 71/62 (!) 74/50  (!) 85/69  Pulse: 81 81  82  Resp: (!) 29 (!) 39  (!) 23  Temp: 97.9 F (36.6 C) 98 F (36.7 C)    TempSrc: Axillary Axillary    SpO2: 99% 95%  99%  Weight:   87.5 kg   Height:        Intake/Output Summary (Last 24 hours) at 12/09/2019 0748 Last data filed at 12/08/2019 0953 Gross per 24 hour  Intake 1098.45 ml  Output --  Net 1098.45 ml   Filed Weights   12/08/19 0647 12/08/19 2145 12/09/19 0607  Weight: 87.1 kg 85.8 kg 87.5 kg    Examination:  General exam: Appears calm but uncomfortable  Respiratory system: diminished breath sounds b/l Cardiovascular system: irregularly irregular. No rubs, gallops or clicks. Gastrointestinal system: Abdomen is nondistended, soft and nontender.  Normal bowel sounds heard. Central nervous system: Alert and oriented. Moves all 4 extremities  Psychiatry: Judgement and insight appear normal. Flat mood and affect.     Data Reviewed: I  have personally reviewed following labs and imaging studies  CBC: Recent Labs  Lab 12/08/19 0700 12/09/19 0414  WBC 5.5 7.0  HGB 13.1 12.0*  HCT 39.9 36.9*  MCV 83.0 83.3  PLT 284 497   Basic Metabolic Panel: Recent Labs  Lab 12/08/19 0700 12/09/19 0414  NA 132* 135  K 2.7* 3.5  CL 92* 100  CO2 28 25  GLUCOSE 109* 160*  BUN 9 16  CREATININE 0.79 1.00  CALCIUM 8.4* 7.8*  MG 1.9  --    GFR: Estimated Creatinine Clearance: 78.5 mL/min (by C-G formula based on SCr of 1 mg/dL). Liver Function Tests: No results for input(s): AST, ALT, ALKPHOS, BILITOT, PROT, ALBUMIN in the last 168  hours. No results for input(s): LIPASE, AMYLASE in the last 168 hours. No results for input(s): AMMONIA in the last 168 hours. Coagulation Profile: No results for input(s): INR, PROTIME in the last 168 hours. Cardiac Enzymes: No results for input(s): CKTOTAL, CKMB, CKMBINDEX, TROPONINI in the last 168 hours. BNP (last 3 results) No results for input(s): PROBNP in the last 8760 hours. HbA1C: Recent Labs    12/08/19 0700  HGBA1C 5.5   CBG: No results for input(s): GLUCAP in the last 168 hours. Lipid Profile: Recent Labs    12/09/19 0414  CHOL 111  HDL 41  LDLCALC 57  TRIG 66  CHOLHDL 2.7   Thyroid Function Tests: Recent Labs    12/08/19 0958 12/09/19 0414  TSH 0.104*  --   FREET4  --  1.81*   Anemia Panel: No results for input(s): VITAMINB12, FOLATE, FERRITIN, TIBC, IRON, RETICCTPCT in the last 72 hours. Sepsis Labs: No results for input(s): PROCALCITON, LATICACIDVEN in the last 168 hours.  Recent Results (from the past 240 hour(s))  Respiratory Panel by RT PCR (Flu A&B, Covid) - Nasopharyngeal Swab     Status: None   Collection Time: 12/08/19  7:47 AM   Specimen: Nasopharyngeal Swab  Result Value Ref Range Status   SARS Coronavirus 2 by RT PCR NEGATIVE NEGATIVE Final    Comment: (NOTE) SARS-CoV-2 target nucleic acids are NOT DETECTED.  The SARS-CoV-2 RNA is generally detectable in upper respiratoy specimens during the acute phase of infection. The lowest concentration of SARS-CoV-2 viral copies this assay can detect is 131 copies/mL. A negative result does not preclude SARS-Cov-2 infection and should not be used as the sole basis for treatment or other patient management decisions. A negative result may occur with  improper specimen collection/handling, submission of specimen other than nasopharyngeal swab, presence of viral mutation(s) within the areas targeted by this assay, and inadequate number of viral copies (<131 copies/mL). A negative result must be  combined with clinical observations, patient history, and epidemiological information. The expected result is Negative.  Fact Sheet for Patients:  PinkCheek.be  Fact Sheet for Healthcare Providers:  GravelBags.it  This test is no t yet approved or cleared by the [place] FDA and  has been authorized for detection and/or diagnosis of SARS-CoV-2 by FDA under an Emergency Use Authorization (EUA). This EUA will remain  in effect (meaning this test can be used) for the duration of the COVID-19 declaration under Section 564(b)(1) of the Act, 21 U.S.C. section 360bbb-3(b)(1), unless the authorization is terminated or revoked sooner.     Influenza A by PCR NEGATIVE NEGATIVE Final   Influenza B by PCR NEGATIVE NEGATIVE Final    Comment: (NOTE) The Xpert Xpress SARS-CoV-2/FLU/RSV assay is intended as an aid in  the diagnosis of  influenza from Nasopharyngeal swab specimens and  should not be used as a sole basis for treatment. Nasal washings and  aspirates are unacceptable for Xpert Xpress SARS-CoV-2/FLU/RSV  testing.  Fact Sheet for Patients: PinkCheek.be  Fact Sheet for Healthcare Providers: GravelBags.it  This test is not yet approved or cleared by the [place] FDA and  has been authorized for detection and/or diagnosis of SARS-CoV-2 by  FDA under an Emergency Use Authorization (EUA). This EUA will remain  in effect (meaning this test can be used) for the duration of the  Covid-19 declaration under Section 564(b)(1) of the Act, 21  U.S.C. section 360bbb-3(b)(1), unless the authorization is  terminated or revoked. Performed at [facility], [address] [employer], [place] [address]          Radiology Studies: DG Chest 1 View  Result Date: 12/08/2019 CLINICAL DATA:  Chest pain. Shortness of breath. Interstitial fibrosis. EXAM: CHEST  1 VIEW  COMPARISON:  08/05/2019.  CT 12/17/2018, 03/30/2014. FINDINGS: Heart size normal. Diffuse severe bilateral pulmonary interstitial prominence again noted none consistent chronic interstitial lung disease. Interstitial prominence is increased from prior exam. Superimposed active process including pneumonitis cannot be excluded. No significant interim change. Stable mild basilar pleural thickening. No pneumothorax. IMPRESSION: Diffuse severe bilateral pulmonary interstitial prominence again noted consistent with known chronic interstitial lung disease. Interstitial prominence has increased from prior exam. Superimposed active process including pneumonitis cannot be excluded. Electronically Signed   By: Marcello Moores  Register   On: 12/08/2019 07:39   CT Head Wo Contrast  Result Date: 12/08/2019 CLINICAL DATA:  Slurred speech. Chest pain and shortness of breath with dizziness. EXAM: CT HEAD WITHOUT CONTRAST TECHNIQUE: Contiguous axial images were obtained from the base of the skull through the vertex without intravenous contrast. COMPARISON:  None. FINDINGS: Brain: No evidence of acute infarction, hemorrhage, hydrocephalus, extra-axial collection or mass lesion/mass effect. Vascular: No hyperdense vessel or unexpected calcification. Skull: Normal. Negative for fracture or focal lesion. Sinuses/Orbits: No acute finding. IMPRESSION: No acute finding or explanation for symptoms. Atherosclerosis. Electronically Signed   By: Monte Fantasia M.D.   On: 12/08/2019 07:41   MR BRAIN WO CONTRAST  Result Date: 12/08/2019 CLINICAL DATA:  Neuro deficit, acute stroke suspected. EXAM: MRI HEAD WITHOUT CONTRAST TECHNIQUE: Multiplanar, multiecho pulse sequences of the brain and surrounding structures were obtained without intravenous contrast. COMPARISON:  CT head from the same day. FINDINGS: Brain: No acute infarction, hemorrhage, hydrocephalus, extra-axial collection or mass lesion. Mild T2/FLAIR hyperintensities within the white  matter, compatible with chronic microvascular ischemic disease. Vascular: Major arterial flow voids are maintained at the skull base. Skull and upper cervical spine: Normal marrow signal. Sinuses/Orbits: The sinuses are clear.  Unremarkable orbits. Other: Small right mastoid effusion. IMPRESSION: No acute intracranial abnormality.  Specifically, no acute infarct. Electronically Signed   By: Margaretha Sheffield MD   On: 12/08/2019 11:13        Scheduled Meds: . amiodarone  200 mg Oral Daily  . aspirin EC  325 mg Oral Daily  . atorvastatin  10 mg Oral Daily  . folic acid  1 mg Oral Daily  . furosemide  40 mg Intravenous Q12H  . ipratropium  2 puff Inhalation Q4H  . irbesartan  75 mg Oral Daily  . leflunomide  10 mg Oral Daily  . LORazepam  0-4 mg Intravenous Q6H   Followed by  . [START ON 12/10/2019] LORazepam  0-4 mg Intravenous Q12H  . metoprolol succinate  50 mg Oral Daily  .  mometasone-formoterol  2 puff Inhalation BID  . multivitamin with minerals  1 tablet Oral Daily  . predniSONE  5 mg Oral Daily  . rivaroxaban  20 mg Oral Q supper  . spironolactone  25 mg Oral Daily  . thiamine  100 mg Oral Daily   Or  . thiamine  100 mg Intravenous Daily  . thiamine  100 mg Oral Daily   Continuous Infusions:   LOS: 0 days    Time spent: 34 mins     Wyvonnia Dusky, MD [facility] Pager [phone]  If 7PM-7AM, please contact night-coverage [email or web address] 12/09/2019, 7:48 AM

## 2019-12-09 NOTE — Progress Notes (Signed)
°   12/09/19 2000  Assess: MEWS Score  BP (!) 85/71  Pulse Rate 63  ECG Heart Rate 86  Resp (!) 32  SpO2 (!) 71 %  Assess: MEWS Score  MEWS Temp 0  MEWS Systolic 1  MEWS Pulse 0  MEWS RR 2  MEWS LOC 0  MEWS Score 3  MEWS Score Color Yellow  Assess: if the MEWS score is Yellow or Red  Were vital signs taken at a resting state? Yes  Focused Assessment No change from prior assessment  Early Detection of Sepsis Score *See Row Information* Medium  MEWS guidelines implemented *See Row Information* Yes  Treat  MEWS Interventions Escalated (See documentation below)  Pain Scale 0-10  Pain Score 0  Take Vital Signs  Increase Vital Sign Frequency  Yellow: Q 2hr X 2 then Q 4hr X 2, if remains yellow, continue Q 4hrs  Escalate  MEWS: Escalate Yellow: discuss with charge nurse/RN and consider discussing with provider and RRT  Notify: Charge Nurse/RN  Name of Charge Nurse/RN Notified Marcene Brawn  Date Charge Nurse/RN Notified 12/09/19  Time Charge Nurse/RN Notified 1959  Notify: Provider  Provider Name/Title Sharion Settler NP  Date Provider Notified 12/09/19  Time Provider Notified 2016  Notification Type Face-to-face  Notification Reason Other (Comment) (Bp)  Response  (Medication changes)  Date of Provider Response 12/09/19  Time of Provider Response 2017  Document  Patient Outcome Other (Comment) (Continue to monitor)  Progress note created (see row info) Yes

## 2019-12-09 NOTE — Progress Notes (Signed)
Patient in bed, getting echo. HFNC in used, respiratory therapist Joanne Chars called to make aware, temperature box is not on. Per RT she is going to replace sterile water and turn back on. Scheduled inhalers given to patient. Will continue to monitor vital signs to be checked per protocol will continue to monitor closely

## 2019-12-09 NOTE — Progress Notes (Signed)
Dr. Jimmye Norman aware of patient blood pressure per md. Hold scheduled amiodarone, avapro. Aldactone. However okay to give metoprolol. Will continue to monitor closely

## 2019-12-09 NOTE — Plan of Care (Signed)
°  Problem: Education: Goal: Knowledge of General Education information will improve Description: Including pain rating scale, medication(s)/side effects and non-pharmacologic comfort measures Outcome: Not Progressing   Problem: Health Behavior/Discharge Planning: Goal: Ability to manage health-related needs will improve Outcome: Not Progressing   Problem: Clinical Measurements: Goal: Ability to maintain clinical measurements within normal limits will improve Outcome: Not Progressing Goal: Will remain free from infection Outcome: Not Progressing Goal: Diagnostic test results will improve Outcome: Not Progressing Goal: Respiratory complications will improve Outcome: Not Progressing Goal: Cardiovascular complication will be avoided Outcome: Not Progressing   Problem: Activity: Goal: Risk for activity intolerance will decrease Outcome: Not Progressing   Problem: Nutrition: Goal: Adequate nutrition will be maintained Outcome: Not Progressing   Problem: Coping: Goal: Level of anxiety will decrease Outcome: Not Progressing   Problem: Elimination: Goal: Will not experience complications related to bowel motility Outcome: Not Progressing Goal: Will not experience complications related to urinary retention Outcome: Not Progressing   Problem: Pain Managment: Goal: General experience of comfort will improve Outcome: Not Progressing   Problem: Safety: Goal: Ability to remain free from injury will improve Outcome: Not Progressing   Problem: Skin Integrity: Goal: Risk for impaired skin integrity will decrease Outcome: Not Progressing   Problem: Education: Goal: Ability to demonstrate management of disease process will improve Outcome: Not Progressing Goal: Ability to verbalize understanding of medication therapies will improve Outcome: Not Progressing Goal: Individualized Educational Video(s) Outcome: Not Progressing   Problem: Activity: Goal: Capacity to carry out  activities will improve Outcome: Not Progressing   Problem: Cardiac: Goal: Ability to achieve and maintain adequate cardiopulmonary perfusion will improve Outcome: Not Progressing   

## 2019-12-10 DIAGNOSIS — J8410 Pulmonary fibrosis, unspecified: Secondary | ICD-10-CM | POA: Diagnosis not present

## 2019-12-10 DIAGNOSIS — I5023 Acute on chronic systolic (congestive) heart failure: Secondary | ICD-10-CM | POA: Diagnosis not present

## 2019-12-10 DIAGNOSIS — I4891 Unspecified atrial fibrillation: Secondary | ICD-10-CM | POA: Diagnosis not present

## 2019-12-10 LAB — BASIC METABOLIC PANEL
Anion gap: 10 (ref 5–15)
BUN: 17 mg/dL (ref 8–23)
CO2: 27 mmol/L (ref 22–32)
Calcium: 8.1 mg/dL — ABNORMAL LOW (ref 8.9–10.3)
Chloride: 99 mmol/L (ref 98–111)
Creatinine, Ser: 0.95 mg/dL (ref 0.61–1.24)
GFR, Estimated: >60 mL/min (ref >60)
Glucose, Bld: 129 mg/dL — ABNORMAL HIGH (ref 70–99)
Potassium: 3.2 mmol/L — ABNORMAL LOW (ref 3.5–5.1)
Sodium: 136 mmol/L (ref 135–145)

## 2019-12-10 LAB — CBC
HCT: 38.9 % — ABNORMAL LOW (ref 39.0–52.0)
Hemoglobin: 13.1 g/dL (ref 13.0–17.0)
MCH: 27.9 pg (ref 26.0–34.0)
MCHC: 33.7 g/dL (ref 30.0–36.0)
MCV: 82.9 fL (ref 80.0–100.0)
Platelets: 238 10*3/uL (ref 150–400)
RBC: 4.69 MIL/uL (ref 4.22–5.81)
RDW: 15.3 % (ref 11.5–15.5)
WBC: 8.1 10*3/uL (ref 4.0–10.5)
nRBC: 0 % (ref 0.0–0.2)

## 2019-12-10 LAB — PROCALCITONIN: Procalcitonin: <0.1 ng/mL

## 2019-12-10 LAB — T3, FREE: T3, Free: 1.8 pg/mL — ABNORMAL LOW (ref 2.0–4.4)

## 2019-12-10 MED ORDER — METOPROLOL SUCCINATE ER 100 MG PO TB24
100.0000 mg | ORAL_TABLET | Freq: Every day | ORAL | Status: DC
  Administered 2019-12-11 – 2019-12-13 (×2): 100 mg via ORAL
  Filled 2019-12-10 (×4): qty 1

## 2019-12-10 MED ORDER — POTASSIUM CHLORIDE CRYS ER 20 MEQ PO TBCR
40.0000 meq | EXTENDED_RELEASE_TABLET | ORAL | Status: AC
  Administered 2019-12-10 (×2): 40 meq via ORAL
  Filled 2019-12-10 (×2): qty 2

## 2019-12-10 MED ORDER — METOPROLOL SUCCINATE ER 50 MG PO TB24
50.0000 mg | ORAL_TABLET | Freq: Once | ORAL | Status: DC
  Filled 2019-12-10: qty 1

## 2019-12-10 NOTE — Progress Notes (Signed)
SUBJECTIVE: Patient resting comfortably in bed. States his breathing has improved and denies chest pain. Continues to require Optiflow oxygen.   Vitals:   12/10/19 0403 12/10/19 0804 12/10/19 0827 12/10/19 1155  BP: 112/72 106/84  92/69  Pulse: 84 78  (!) 44  Resp: (!) 25 (!) 33 (!) 22 (!) 25  Temp: 98 F (36.7 C) 97.7 F (36.5 C)  98.1 F (36.7 C)  TempSrc: Oral Oral  Oral  SpO2: 100% 100%  100%  Weight:      Height:        Intake/Output Summary (Last 24 hours) at 12/10/2019 1245 Last data filed at 12/10/2019 1159 Gross per 24 hour  Intake 612.88 ml  Output 3980 ml  Net -3367.12 ml    LABS: Basic Metabolic Panel: Recent Labs    12/08/19 0700 12/08/19 0700 12/09/19 0414 12/10/19 0433  NA 132*   < > 135 136  K 2.7*   < > 3.5 3.2*  CL 92*   < > 100 99  CO2 28   < > 25 27  GLUCOSE 109*   < > 160* 129*  BUN 9   < > 16 17  CREATININE 0.79   < > 1.00 0.95  CALCIUM 8.4*   < > 7.8* 8.1*  MG 1.9  --   --   --    < > = values in this interval not displayed.   Liver Function Tests: No results for input(s): AST, ALT, ALKPHOS, BILITOT, PROT, ALBUMIN in the last 72 hours. No results for input(s): LIPASE, AMYLASE in the last 72 hours. CBC: Recent Labs    12/09/19 0414 12/10/19 0433  WBC 7.0 8.1  HGB 12.0* 13.1  HCT 36.9* 38.9*  MCV 83.3 82.9  PLT 256 238   Cardiac Enzymes: No results for input(s): CKTOTAL, CKMB, CKMBINDEX, TROPONINI in the last 72 hours. BNP: Invalid input(s): POCBNP D-Dimer: No results for input(s): DDIMER in the last 72 hours. Hemoglobin A1C: Recent Labs    12/08/19 0700  HGBA1C 5.5   Fasting Lipid Panel: Recent Labs    12/09/19 0414  CHOL 111  HDL 41  LDLCALC 57  TRIG 66  CHOLHDL 2.7   Thyroid Function Tests: Recent Labs    12/08/19 0958  TSH 0.104*   Anemia Panel: No results for input(s): VITAMINB12, FOLATE, FERRITIN, TIBC, IRON, RETICCTPCT in the last 72 hours.   PHYSICAL EXAM General: Well developed, well nourished, in  no acute distress HEENT:  Normocephalic and atramatic Neck:  No JVD.  Lungs: Coarse crackles bibasilar and scattered rhonchi Heart: Atrial Fibrillation Abdomen: Bowel sounds are positive, abdomen soft and non-tender  Msk:  Back normal, normal gait. Normal strength and tone for age. Extremities: No clubbing, cyanosis or edema.   Neuro: Alert and oriented X 3. Psych:  Good affect, responds appropriately  TELEMETRY: Atrial Fibrillation 122/bpm  ASSESSMENT AND PLAN: Patient presenting to the emergency department with chest pain and shortness of breath after being noncompliant with medications for 1 week.  Patient has gone back into A fib with RVR. Plan to increase metoprolol succinate to 100mg  daily and consider adding IV digoxin if rate remains above >110. Will not initiate Amio infusion d/t pulm fibrosis or Cardizem infusion d/t hypotension. Please continue amiodarone 200mg  and Xarelto.  Patient continues to have dyspnea in the setting of HFrEF exacerbation and pulmonary fibrosis.  Continue supplemental oxygen and wean as tolerated. Patient with hypoxia overnight and began on furosemide infusion. Would recommend continuing and having strict  I & Os. Please prioritize Entresto over spironolactone if patient remains mildly hypotensive. We will continue to follow  Principal Problem:   Atrial fibrillation with RVR ([place]) Active Problems:   HTN (hypertension)   Interstitial lung disease (H[facility])   Chronic systolic CHF (congestive heart failure) (HCC)   Rheumatoid arthritis involving multiple sites with positive rheumatoid factor (HCC)   History of alcoholism ([place])   Hypokalemia   Pulmonary fibrosis (H[facility])   Slurred speech   Chest pain   Elevated troponin   Atrial fibrillation ([place])    Jason Sill, NP-C 12/10/2019 12:45 PM

## 2019-12-10 NOTE — Progress Notes (Signed)
   12/10/19 1155  Assess: MEWS Score  Temp 98.1 F (36.7 C)  BP 92/69  Pulse Rate (!) 44  ECG Heart Rate (!) 104  Resp (!) 25  Level of Consciousness Alert  SpO2 100 %  O2 Device HFNC  O2 Flow Rate (L/min) 40 L/min  FiO2 (%) 40 %  Assess: MEWS Score  MEWS Temp 0  MEWS Systolic 1  MEWS Pulse 1  MEWS RR 1  MEWS LOC 0  MEWS Score 3  MEWS Score Color Yellow  Assess: if the MEWS score is Yellow or Red  Were vital signs taken at a resting state? Yes  Focused Assessment Change from prior assessment (see assessment flowsheet)  Early Detection of Sepsis Score *See Row Information* Low  MEWS guidelines implemented *See Row Information* Yes  Treat  MEWS Interventions Administered scheduled meds/treatments  Pain Scale 0-10  Pain Score 0  Take Vital Signs  Increase Vital Sign Frequency  Yellow: Q 2hr X 2 then Q 4hr X 2, if remains yellow, continue Q 4hrs  Escalate  MEWS: Escalate Yellow: discuss with charge nurse/RN and consider discussing with provider and RRT  Notify: Charge Nurse/RN  Name of Charge Nurse/RN Notified Tammy, CN  Date Charge Nurse/RN Notified 12/10/19  Time Charge Nurse/RN Notified 1356  Notify: Provider  Provider Name/Title Dr. Gwyndolyn Saxon  Date Provider Notified 12/10/19  Time Provider Notified 1356  Notification Type Page  Notification Reason Other (Comment) (MEWS)  Response No new orders  Date of Provider Response 12/10/19  Time of Provider Response 1400  Document  Patient Outcome Other (Comment) (pt stable)

## 2019-12-10 NOTE — Progress Notes (Signed)
PROGRESS NOTE    Jason Fitzgerald  YTK:[id number] DOB: 07/22/1950 DOA: 12/08/2019 PCP: Steele Sizer, MD   Assessment & Plan:   Principal Problem:   Atrial fibrillation with RVR ([place]) Active Problems:   HTN (hypertension)   Interstitial lung disease ([place])   Chronic systolic CHF (congestive heart failure) (HCC)   Rheumatoid arthritis involving multiple sites with positive rheumatoid factor (HCC)   History of alcoholism ([place])   Hypokalemia   Pulmonary fibrosis (HCC)   Slurred speech   Chest pain   Elevated troponin   Atrial fibrillation (HCC)   Atrial fibrillation: w/ RVR. Most likely due to medication noncompliance. Continue on home dose of amiodarone, xarelto, metoprolol. Continue on tele   Acute on chronic systolic CHF: echo is worse w/ EF 25-30% LV global hypokinesis, diastolic parameters are indeterminate, mild to mod MR. Continue on IV lasix, spironolactone, irbesartan. Neg approx 1.9L. Strict I/Os & daily weights. Fluid restriction. Cardio following and recs apprec   Acute hypoxic respiratory failure: likely secondary to CHF & pulmonary fibrosis. Continue on supplemental oxygen and wean as tolerated, currently on HFNC.   Interstitial lung disease and pulmonary fibrosis: continue on bronchodilators and encourage incentive spirometry. Pulmon following and recs apprec   Chest pain: w/ elevated troponins which likely secondary to demand ischemia from rapid a. fib. Cardio following and recs apprec. Echo shows EF 25-30%, LV has global hypokinesis, diastolic parameters are indeterminate, mild to mod MR.  HTN: continue on aldactone, irbesartan, metoprolol. IV hydralazine prn   History of alcoholism: continue on CIWA protocol   Hypokalemia: KCL repleted. Will continue to monitor    Slurred speech: MRI brain neg for CVA   Rheumatoid arthritis: continue on home dose of leflunomide. Continue on increased dose of prednisone     DVT prophylaxis: xarelto Code Status: full   Family Communication: Disposition Plan: depends on PT/OT recs (not consulted yet)   Status is: Inpatient  Remains inpatient appropriate because:Ongoing diagnostic testing needed not appropriate for outpatient work up, Unsafe d/c plan and IV treatments appropriate due to intensity of illness or inability to take PO   Dispo: The patient is from: Home              Anticipated d/c is to: Home              Anticipated d/c date is: 3 days              Patient currently is not medically stable to d/c.      Consultants:   Cardio  pulmon   Procedures:    Antimicrobials:    Subjective: Pt c/o shortness of breath worse than day prior   Objective: Vitals:   12/09/19 2157 12/09/19 2223 12/10/19 0356 12/10/19 0403  BP: (!) 84/69 91/70  112/72  Pulse: 84 89  84  Resp: (!) 32 (!) 26  (!) 25  Temp:  98 F (36.7 C)  98 F (36.7 C)  TempSrc:  Oral  Oral  SpO2: 100% 100%  100%  Weight:   85.8 kg   Height:        Intake/Output Summary (Last 24 hours) at 12/10/2019 0719 Last data filed at 12/10/2019 0550 Gross per 24 hour  Intake 972.88 ml  Output 2930 ml  Net -1957.12 ml   Filed Weights   12/08/19 2145 12/09/19 0607 12/10/19 0356  Weight: 85.8 kg 87.5 kg 85.8 kg    Examination:  General exam: Appears uncomfortable  Respiratory system: course breath sounds b/l  Cardiovascular system: irregularly irregular. No rubs or gallops Gastrointestinal system: Abd is soft, non-distended, non-tender, hypoactive bowel sounds  Central nervous system: Alert and oriented. Moves all 4 extremities  Psychiatry: Judgement and insight appear normal. Flat mood and affect.     Data Reviewed: I have personally reviewed following labs and imaging studies  CBC: Recent Labs  Lab 12/08/19 0700 12/09/19 0414 12/10/19 0433  WBC 5.5 7.0 8.1  HGB 13.1 12.0* 13.1  HCT 39.9 36.9* 38.9*  MCV 83.0 83.3 82.9  PLT 284 256 419   Basic Metabolic Panel: Recent Labs  Lab 12/08/19 0700  12/09/19 0414 12/10/19 0433  NA 132* 135 136  K 2.7* 3.5 3.2*  CL 92* 100 99  CO2 28 25 27   GLUCOSE 109* 160* 129*  BUN 9 16 17   CREATININE 0.79 1.00 0.95  CALCIUM 8.4* 7.8* 8.1*  MG 1.9  --   --    GFR: Estimated Creatinine Clearance: 81.9 mL/min (by C-G formula based on SCr of 0.95 mg/dL). Liver Function Tests: No results for input(s): AST, ALT, ALKPHOS, BILITOT, PROT, ALBUMIN in the last 168 hours. No results for input(s): LIPASE, AMYLASE in the last 168 hours. No results for input(s): AMMONIA in the last 168 hours. Coagulation Profile: No results for input(s): INR, PROTIME in the last 168 hours. Cardiac Enzymes: No results for input(s): CKTOTAL, CKMB, CKMBINDEX, TROPONINI in the last 168 hours. BNP (last 3 results) No results for input(s): PROBNP in the last 8760 hours. HbA1C: Recent Labs    12/08/19 0700  HGBA1C 5.5   CBG: No results for input(s): GLUCAP in the last 168 hours. Lipid Profile: Recent Labs    12/09/19 0414  CHOL 111  HDL 41  LDLCALC 57  TRIG 66  CHOLHDL 2.7   Thyroid Function Tests: Recent Labs    12/08/19 0958 12/09/19 0414  TSH 0.104*  --   FREET4  --  1.81*   Anemia Panel: No results for input(s): VITAMINB12, FOLATE, FERRITIN, TIBC, IRON, RETICCTPCT in the last 72 hours. Sepsis Labs: No results for input(s): PROCALCITON, LATICACIDVEN in the last 168 hours.  Recent Results (from the past 240 hour(s))  Respiratory Panel by RT PCR (Flu A&B, Covid) - Nasopharyngeal Swab     Status: None   Collection Time: 12/08/19  7:47 AM   Specimen: Nasopharyngeal Swab  Result Value Ref Range Status   SARS Coronavirus 2 by RT PCR NEGATIVE NEGATIVE Final    Comment: (NOTE) SARS-CoV-2 target nucleic acids are NOT DETECTED.  The SARS-CoV-2 RNA is generally detectable in upper respiratoy specimens during the acute phase of infection. The lowest concentration of SARS-CoV-2 viral copies this assay can detect is 131 copies/mL. A negative result does not  preclude SARS-Cov-2 infection and should not be used as the sole basis for treatment or other patient management decisions. A negative result may occur with  improper specimen collection/handling, submission of specimen other than nasopharyngeal swab, presence of viral mutation(s) within the areas targeted by this assay, and inadequate number of viral copies (<131 copies/mL). A negative result must be combined with clinical observations, patient history, and epidemiological information. The expected result is Negative.  Fact Sheet for Patients:  PinkCheek.be  Fact Sheet for Healthcare Providers:  GravelBags.it  This test is no t yet approved or cleared by the [place] FDA and  has been authorized for detection and/or diagnosis of SARS-CoV-2 by FDA under an Emergency Use Authorization (EUA). This EUA will remain  in effect (meaning this test can  be used) for the duration of the COVID-19 declaration under Section 564(b)(1) of the Act, 21 U.S.C. section 360bbb-3(b)(1), unless the authorization is terminated or revoked sooner.     Influenza A by PCR NEGATIVE NEGATIVE Final   Influenza B by PCR NEGATIVE NEGATIVE Final    Comment: (NOTE) The Xpert Xpress SARS-CoV-2/FLU/RSV assay is intended as an aid in  the diagnosis of influenza from Nasopharyngeal swab specimens and  should not be used as a sole basis for treatment. Nasal washings and  aspirates are unacceptable for Xpert Xpress SARS-CoV-2/FLU/RSV  testing.  Fact Sheet for Patients: PinkCheek.be  Fact Sheet for Healthcare Providers: GravelBags.it  This test is not yet approved or cleared by the [place] FDA and  has been authorized for detection and/or diagnosis of SARS-CoV-2 by  FDA under an Emergency Use Authorization (EUA). This EUA will remain  in effect (meaning this test can be used) for the  duration of the  Covid-19 declaration under Section 564(b)(1) of the Act, 21  U.S.C. section 360bbb-3(b)(1), unless the authorization is  terminated or revoked. Performed at [facility], [address], [place], [place] [address]          Radiology Studies: CT Head Wo Contrast  Result Date: 12/08/2019 CLINICAL DATA:  Slurred speech. Chest pain and shortness of breath with dizziness. EXAM: CT HEAD WITHOUT CONTRAST TECHNIQUE: Contiguous axial images were obtained from the base of the skull through the vertex without intravenous contrast. COMPARISON:  None. FINDINGS: Brain: No evidence of acute infarction, hemorrhage, hydrocephalus, extra-axial collection or mass lesion/mass effect. Vascular: No hyperdense vessel or unexpected calcification. Skull: Normal. Negative for fracture or focal lesion. Sinuses/Orbits: No acute finding. IMPRESSION: No acute finding or explanation for symptoms. Atherosclerosis. Electronically Signed   By: Monte Fantasia M.D.   On: 12/08/2019 07:41   CT ANGIO CHEST PE W OR WO CONTRAST  Result Date: 12/09/2019 CLINICAL DATA:  Inpatient. Rheumatoid arthritis. COPD. Pulmonary fibrosis. Chest pain and dyspnea. Nonproductive cough. Arrhythmia. EXAM: CT ANGIOGRAPHY CHEST WITH CONTRAST TECHNIQUE: Multidetector CT imaging of the chest was performed using the standard protocol during bolus administration of intravenous contrast. Multiplanar CT image reconstructions and MIPs were obtained to evaluate the vascular anatomy. CONTRAST:  47mL OMNIPAQUE IOHEXOL 350 MG/ML SOLN COMPARISON:  08/05/2018 chest radiograph. 12/17/2018 chest CT angiogram. FINDINGS: Cardiovascular: The study is moderate quality for the evaluation of pulmonary embolism, with motion degradation. There are no convincing filling defects in the central, lobar, segmental or subsegmental pulmonary artery branches to suggest acute pulmonary embolism. Atherosclerotic nonaneurysmal thoracic aorta. Normal caliber  pulmonary arteries. Normal heart size. No significant pericardial fluid/thickening. Left anterior descending coronary atherosclerosis. Mediastinum/Nodes: No discrete thyroid nodules. Unremarkable esophagus. No axillary adenopathy. New mild bilateral mediastinal and bilateral hilar lymphadenopathy. Representative 1.2 cm AP window node (series 4/image 47), 1.2 cm right subcarinal node (series 4/image 65), 1.5 cm right hilar node (series 4/image 57) and 1.2 cm left hilar node (series 4/image 57). Lungs/Pleura: No pneumothorax. Small dependent bilateral pleural effusions, right greater than left. No lung masses or significant pulmonary nodules. Patchy consolidation and ground-glass opacity throughout the bilateral upper lobes is new, most prominent in the posterior left upper lobe. Background of severe patchy confluent subpleural reticulation and severe honeycombing with associated volume loss, parenchymal distortion and mild traction bronchiectasis with a basilar predominance, not appreciably changed. Upper abdomen: Contrast reflux into the IVC and hepatic veins. Simple 2.4 cm peripheral right liver cyst. Musculoskeletal: No aggressive appearing focal osseous lesions. Marked bilateral gynecomastia,  unchanged. Review of the MIP images confirms the above findings. IMPRESSION: 1. Motion degraded scan. No evidence of pulmonary embolism. 2. New patchy consolidation and ground-glass opacity throughout the bilateral upper lobes, most prominent in the posterior left upper lobe. Given the small dependent bilateral pleural effusions and the contrast reflux into the IVC and hepatic veins with reported history of CHF, differential for these upper lobe pulmonary opacities includes acute cardiogenic pulmonary edema versus multilobar pneumonia. 3. New mild bilateral mediastinal and bilateral hilar lymphadenopathy, nonspecific, potentially reactive. Suggest attention on follow-up chest CT with IV contrast in 3-6 months. 4. Background  of severe basilar predominant fibrotic interstitial lung disease with marked honeycombing, not appreciably changed. Findings are consistent with UIP per consensus guidelines: Diagnosis of Idiopathic Pulmonary Fibrosis: An Official ATS/ERS/JRS/ALAT Clinical Practice Guideline. [place], Iss 5, [phone], Oct 03 2016. 5. One vessel coronary atherosclerosis. 6. Aortic Atherosclerosis (ICD10-I70.0). Electronically Signed   By: Ilona Sorrel M.D.   On: 12/09/2019 09:02   MR BRAIN WO CONTRAST  Result Date: 12/08/2019 CLINICAL DATA:  Neuro deficit, acute stroke suspected. EXAM: MRI HEAD WITHOUT CONTRAST TECHNIQUE: Multiplanar, multiecho pulse sequences of the brain and surrounding structures were obtained without intravenous contrast. COMPARISON:  CT head from the same day. FINDINGS: Brain: No acute infarction, hemorrhage, hydrocephalus, extra-axial collection or mass lesion. Mild T2/FLAIR hyperintensities within the white matter, compatible with chronic microvascular ischemic disease. Vascular: Major arterial flow voids are maintained at the skull base. Skull and upper cervical spine: Normal marrow signal. Sinuses/Orbits: The sinuses are clear.  Unremarkable orbits. Other: Small right mastoid effusion. IMPRESSION: No acute intracranial abnormality.  Specifically, no acute infarct. Electronically Signed   By: Margaretha Sheffield MD   On: 12/08/2019 11:13   ECHOCARDIOGRAM COMPLETE  Result Date: 12/09/2019    ECHOCARDIOGRAM REPORT   Patient Name:   Jason Fitzgerald Date of Exam: 12/09/2019 Medical Rec #:  [id number]       Height:       69.0 in Accession #:    [id number]      Weight:       192.9 lb Date of Birth:  1950/12/05      BSA:          2.035 m Patient Age:    69 years        BP:           98/59 mmHg Patient Gender: M               HR:           84 bpm. Exam Location:  [facility] Procedure: 2D Echo Indications:     Chest Pain 786.50 / R07.9  History:         Patient has prior history of  Echocardiogram examinations, most                  recent 09/01/2019. CHF, COPD; Risk Factors:Hypertension.  Sonographer:     Avanell Shackleton Referring Phys:  Unknown Foley NIU Diagnosing Phys: Kate Sable MD IMPRESSIONS  1. Left ventricular ejection fraction, by estimation, is 25 to 30%. Left ventricular ejection fraction by 2D MOD biplane is 26.1 %. The left ventricle has severely decreased function. The left ventricle demonstrates global hypokinesis. Left ventricular diastolic parameters are indeterminate.  2. Right ventricular systolic function is moderately reduced. The right ventricular size is normal. There is moderately elevated pulmonary artery systolic pressure.  3. Left atrial size was mildly dilated.  4. The mitral valve is grossly normal. Mild to moderate mitral valve regurgitation.  5. The aortic valve is tricuspid. Aortic valve regurgitation is mild.  6. The inferior vena cava is dilated in size with <50% respiratory variability, suggesting right atrial pressure of 15 mmHg. FINDINGS  Left Ventricle: Left ventricular ejection fraction, by estimation, is 25 to 30%. Left ventricular ejection fraction by 2D MOD biplane is 26.1 %. The left ventricle has severely decreased function. The left ventricle demonstrates global hypokinesis. The left ventricular internal cavity size was normal in size. There is no left ventricular hypertrophy. Left ventricular diastolic parameters are indeterminate. Right Ventricle: The right ventricular size is normal. No increase in right ventricular wall thickness. Right ventricular systolic function is moderately reduced. There is moderately elevated pulmonary artery systolic pressure. The tricuspid regurgitant velocity is 3.35 m/s, and with an assumed right atrial pressure of 15 mmHg, the estimated right ventricular systolic pressure is 56.3 mmHg. Left Atrium: Left atrial size was mildly dilated. Right Atrium: Right atrial size was normal in size. Pericardium: There is no  evidence of pericardial effusion. Mitral Valve: The mitral valve is grossly normal. Mild to moderate mitral valve regurgitation. Tricuspid Valve: The tricuspid valve is normal in structure. Tricuspid valve regurgitation is not demonstrated. Aortic Valve: The aortic valve is tricuspid. Aortic valve regurgitation is mild. Pulmonic Valve: The pulmonic valve was not well visualized. Pulmonic valve regurgitation is not visualized. Aorta: The aortic root is normal in size and structure. Venous: The inferior vena cava is dilated in size with less than 50% respiratory variability, suggesting right atrial pressure of 15 mmHg. IAS/Shunts: No atrial level shunt detected by color flow Doppler.  LEFT VENTRICLE PLAX 2D                        Biplane EF (MOD) LVIDd:         5.33 cm         LV Biplane EF:   Left LVIDs:         4.79 cm                          ventricular LV PW:         0.86 cm                          ejection LV IVS:        0.82 cm                          fraction by LVOT diam:     2.00 cm                          2D MOD LVOT Area:     3.14 cm                         biplane is                                                 26.1 %.  LV Volumes (MOD) LV vol d, MOD    230.0 ml A2C: LV vol d, MOD    146.0 ml A4C: LV vol  s, MOD    164.0 ml A2C: LV vol s, MOD    107.0 ml A4C: LV SV MOD A2C:   66.0 ml LV SV MOD A4C:   146.0 ml LV SV MOD BP:    48.0 ml RIGHT VENTRICLE            IVC RV S prime:     7.10 cm/s  IVC diam: 2.36 cm TAPSE (M-mode): 1.2 cm LEFT ATRIUM             Index       RIGHT ATRIUM           Index LA diam:        4.80 cm 2.36 cm/m  RA Area:     15.50 cm LA Vol (A2C):   93.9 ml 46.15 ml/m RA Volume:   39.00 ml  19.17 ml/m LA Vol (A4C):   46.1 ml 22.66 ml/m LA Biplane Vol: 68.2 ml 33.52 ml/m   AORTA Ao Root diam: 3.00 cm MR Peak grad: 61.1 mmHg   TRICUSPID VALVE MR Mean grad: 38.5 mmHg   TR Peak grad:   44.9 mmHg MR Vmax:      390.75 cm/s TR Vmax:        335.00 cm/s MR Vmean:     291.2 cm/s                            SHUNTS                           Systemic Diam: 2.00 cm Kate Sable MD Electronically signed by Kate Sable MD Signature Date/Time: 12/09/2019/2:08:38 PM    Final         Scheduled Meds:  amiodarone  200 mg Oral Daily   aspirin EC  325 mg Oral Daily   atorvastatin  10 mg Oral Daily   folic acid  1 mg Oral Daily   ipratropium  2 puff Inhalation Q4H   leflunomide  10 mg Oral Daily   LORazepam  0-4 mg Intravenous Q6H   Followed by   LORazepam  0-4 mg Intravenous Q12H   metoprolol succinate  50 mg Oral Daily   mometasone-formoterol  2 puff Inhalation BID   multivitamin with minerals  1 tablet Oral Daily   potassium chloride  40 mEq Oral Q4H   predniSONE  5 mg Oral Daily   rivaroxaban  20 mg Oral Q supper   sacubitril-valsartan  1 tablet Oral BID   spironolactone  25 mg Oral Daily   thiamine  100 mg Oral Daily   Continuous Infusions:  sodium chloride 500 mL (12/09/19 2348)   albumin human 25 g (12/10/19 0411)   furosemide (LASIX) 200 mg in dextrose 5% 100 mL (2mg /mL) infusion 20 mg/hr (12/10/19 0411)     LOS: 1 day    Time spent: 30 mins     Wyvonnia Dusky, MD [facility] Pager [phone]  If 7PM-7AM, please contact night-coverage [email or web address] 12/10/2019, 7:19 AM

## 2019-12-10 NOTE — Consult Note (Signed)
Date: 12/10/2019,   MRN# [id number] Jason Fitzgerald [birth year] Code Status:     Code Status Orders  (From admission, onward)         Start     Ordered   12/08/19 1431  Full code  Continuous        12/08/19 1430        Code Status History    Date Active Date Inactive Code Status Order ID Comments User Context   08/05/2019 0423 08/07/2019 2106 Full Code [id number]  Christel Mormon, MD ED   12/17/2018 1453 12/20/2018 1529 Full Code [id number]  Para Skeans, MD ED   02/03/2018 1520 02/05/2018 1811 Full Code [id number]  Saundra Shelling, MD ED   03/04/2017 1231 03/06/2017 1707 Full Code [id number]  Demetrios Loll, MD ED   10/26/2014 1128 10/26/2014 1500 Full Code [id number]  Hubbard Robinson, MD Outpatient   Advance Care Planning Activity     Hosp day:@LENGTHOFSTAYDAYS @ Referring MD: @ATDPROV @         CC: RESPIRATORY FAILURE  HPI: This is a 69 yr old male, who has a oast hx of etoh abuse, smoking hx, copd, on trelegy, UIP, hx of rheumatoid arthritis, followed by Dr. Jefm Bryant, pulmonary htn, cardiomyopathy, ef has fallen further. On amiodarone. Presented to [place] with chest pain, palpitation (afib with rvr), shortness of breath, non compliant with his meds, slurred speech  Presently still sob with minimum activity. Asked to see regarding the uip. No leg pian, no significant edema. Hypoxic on high flow .  PMHX:   Past Medical History:  Diagnosis Date  . CHF (congestive heart failure) ([place])   . Chronic cough   . COPD (chronic obstructive pulmonary disease) ([place])   . Elevated liver function tests   . Emphysema of lung ([place])   . Fibrosis, pulmonary, interstitial, diffuse ([place])   . GERD (gastroesophageal reflux disease)   . History of cocaine abuse ([place])   . Mediastinal lymphadenopathy   . Pulmonary fibrosis ([place])   . Right inguinal hernia    Surgical Hx:  Past Surgical History:  Procedure Laterality Date  . COLONOSCOPY WITH PROPOFOL N/A 08/19/2018   Procedure: COLONOSCOPY WITH PROPOFOL;  Surgeon:  Virgel Manifold, MD;  Location: [facility];  Service: Endoscopy;  Laterality: N/A;  . HERNIA REPAIR Right 1973   open  . INGUINAL HERNIA REPAIR Right 11/08/2014   Procedure: LAPAROSCOPIC RIGHT INGUINAL HERNIA REPAIR;  Surgeon: Hubbard Robinson, MD;  Location: [facility] ORS;  Service: General;  Laterality: Right;  . knee arthroscopy Right 1972  . RIGHT/LEFT HEART CATH AND CORONARY ANGIOGRAPHY Right 03/05/2017   Procedure: RIGHT/LEFT HEART CATH AND CORONARY ANGIOGRAPHY;  Surgeon: Dionisio David, MD;  Location: [facility];  Service: Cardiovascular;  Laterality: Right;   Family Hx:  Family History  Problem Relation Age of Onset  . Diabetes Mother   . Cancer Father   . AAA (abdominal aortic aneurysm) Brother    Social Hx:   Social History   Tobacco Use  . Smoking status: Never Smoker  . Smokeless tobacco: Never Used  Vaping Use  . Vaping Use: Never used  Substance Use Topics  . Alcohol use: Not Currently    Alcohol/week: 0.0 standard drinks    Comment: 2 quarts a week, he quit again 12/2018  . Drug use: Not Currently    Types: Cocaine    Comment: as an young adult    Medication:    Home Medication:    Current Medication: @CURMEDTAB @  Allergies:  Patient has no known allergies.  Review of Systems: Gen:  Denies  fever, sweats, chills HEENT: Denies blurred vision, double vision, ear pain, eye pain, hearing loss, nose bleeds, sore throat Cvc:  No dizziness, chest pain or heaviness Resp: Increase shortness   Gi: Denies swallowing difficulty, stomach pain, nausea or vomiting, diarrhea, constipation, bowel incontinence Gu:  Denies bladder incontinence, burning urine Ext:   No Joint pain, stiffness or swelling Skin: No skin rash, easy bruising or bleeding or hives Endoc:  No polyuria, polydipsia , polyphagia or weight change Psych: No depression, insomnia or hallucinations  Other:  All other systems negative  Physical Examination:   VS: BP 106/84 (BP  Location: Right Arm)   Pulse 78   Temp 97.7 F (36.5 C) (Oral)   Resp (!) 22   Ht 5\' 9"  (1.753 m)   Wt 85.8 kg   SpO2 100%   BMI 27.93 kg/m   General Appearance: moderate distress, high flow oxygen on, drinking juice, small frame  Neuro: without focal findings, mental status, speech normal, alert and oriented, cranial nerves 2-12 intact, reflexes normal and symmetric, sensation grossly normal  HEENT: PERRLA, EOM intact, no ptosis, no other lesions noticed NECK: Supple, no stridor Pulmonary:.No wheezing, No rales  + crackles   Cardiovascular:  Normal S1,S2.  No m/r/g.    Abdomen:Benign, Soft, non-tender, No masses, hepatosplenomegaly, No lymphadenopathy Endoc: No evident thyromegaly, no signs of acromegaly or Cushing features Skin:   warm, no rashes, no ecchymosis  Extremities: normal, no cyanosis, clubbing, no edema, warm with normal capillary refill. Other findings:   Labs results:   Recent Labs    12/08/19 0700 12/09/19 0414 12/10/19 0433  HGB 13.1 12.0* 13.1  HCT 39.9 36.9* 38.9*  MCV 83.0 83.3 82.9  WBC 5.5 7.0 8.1  BUN 9 16 17   CREATININE 0.79 1.00 0.95  GLUCOSE 109* 160* 129*  CALCIUM 8.4* 7.8* 8.1*  ,      Narrative & Impression  CLINICAL DATA:  Inpatient. Rheumatoid arthritis. COPD. Pulmonary fibrosis. Chest pain and dyspnea. Nonproductive cough. Arrhythmia.  EXAM: CT ANGIOGRAPHY CHEST WITH CONTRAST  TECHNIQUE: Multidetector CT imaging of the chest was performed using the standard protocol during bolus administration of intravenous contrast. Multiplanar CT image reconstructions and MIPs were obtained to evaluate the vascular anatomy.  CONTRAST:  4mL OMNIPAQUE IOHEXOL 350 MG/ML SOLN  COMPARISON:  08/05/2018 chest radiograph. 12/17/2018 chest CT angiogram.  FINDINGS: Cardiovascular: The study is moderate quality for the evaluation of pulmonary embolism, with motion degradation. There are no convincing filling defects in the central, lobar,  segmental or subsegmental pulmonary artery branches to suggest acute pulmonary embolism. Atherosclerotic nonaneurysmal thoracic aorta. Normal caliber pulmonary arteries. Normal heart size. No significant pericardial fluid/thickening. Left anterior descending coronary atherosclerosis.  Mediastinum/Nodes: No discrete thyroid nodules. Unremarkable esophagus. No axillary adenopathy. New mild bilateral mediastinal and bilateral hilar lymphadenopathy. Representative 1.2 cm AP window node (series 4/image 47), 1.2 cm right subcarinal node (series 4/image 65), 1.5 cm right hilar node (series 4/image 57) and 1.2 cm left hilar node (series 4/image 57).  Lungs/Pleura: No pneumothorax. Small dependent bilateral pleural effusions, right greater than left. No lung masses or significant pulmonary nodules. Patchy consolidation and ground-glass opacity throughout the bilateral upper lobes is new, most prominent in the posterior left upper lobe. Background of severe patchy confluent subpleural reticulation and severe honeycombing with associated volume loss, parenchymal distortion and mild traction bronchiectasis with a basilar predominance, not appreciably changed.  Upper abdomen: Contrast reflux  into the IVC and hepatic veins. Simple 2.4 cm peripheral right liver cyst.  Musculoskeletal: No aggressive appearing focal osseous lesions. Marked bilateral gynecomastia, unchanged.  Review of the MIP images confirms the above findings.  IMPRESSION: 1. Motion degraded scan. No evidence of pulmonary embolism. 2. New patchy consolidation and ground-glass opacity throughout the bilateral upper lobes, most prominent in the posterior left upper lobe. Given the small dependent bilateral pleural effusions and the contrast reflux into the IVC and hepatic veins with reported history of CHF, differential for these upper lobe pulmonary opacities includes acute cardiogenic pulmonary edema versus  multilobar pneumonia. 3. New mild bilateral mediastinal and bilateral hilar lymphadenopathy, nonspecific, potentially reactive. Suggest attention on follow-up chest CT with IV contrast in 3-6 months. 4. Background of severe basilar predominant fibrotic interstitial lung disease with marked honeycombing, not appreciably changed. Findings are consistent with UIP per consensus guidelines: Diagnosis of Idiopathic Pulmonary Fibrosis: An Official ATS/ERS/JRS/ALAT Clinical Practice Guideline. George [place], Iss 5, [phone], Oct 03 2016. 5. One vessel coronary atherosclerosis. 6. Aortic Atherosclerosis (ICD10-I70.0).   Electronically Signed   By: Ilona Sorrel M.D.   On: 12/09/2019 09:02   echo 1. Left ventricular ejection fraction, by estimation, is 25 to 30%. Left  ventricular ejection fraction by 2D MOD biplane is 26.1 %. The left  ventricle has severely decreased function. The left ventricle demonstrates  global hypokinesis. Left ventricular  diastolic parameters are indeterminate.  2. Right ventricular systolic function is moderately reduced. The right  ventricular size is normal. There is moderately elevated pulmonary artery  systolic pressure.  3. Left atrial size was mildly dilated.  4. The mitral valve is grossly normal. Mild to moderate mitral valve  regurgitation.  5. The aortic valve is tricuspid. Aortic valve regurgitation is mild.  6. The inferior vena cava is dilated in size with <50% respiratory  variability, suggesting right atrial pressure of 15 mmHg.   Cath 2019  Hemodynamic findings consistent with pulmonary hypertension.            Normal coronries with severe LV systolic dysfunction and severe pulmonary HTN.  FVC was 1.38 liters, 34% of predicted FEV1 was 1.08, 34% of predicted FEV1 ratio was 79 FEF 25-75% liters per second was 29% of predicted  LUNG VOLUMES: TLC was 43% of predicted RV was 52% of predicted  DIFFUSION  CAPACITY: DLCO was 33% of predicted DLCO/VA was 90% of predicted  FLOW VOLUME LOOP: Looks restrctive  Impression Spirometry is c/w severe restriction TLC is severely decreased Diffusion capacity is severely decreased  Assessment and Plan:  This is an an ex smoker, etoh abuser, presented in afib with rvr, better control now, non complaince, copd, uip. Cardiomyopathy and secondary htn. His dyspnea is multifactorial. Seem to be more congestive heart failure at this time. Did not think there is amiodarone toxicity, since he has been non compliant with his meds.   1.cardiomyopathy, pulmonary edema, effusions, ef 30%, nl coronary arteries 2019. ? Alcohol cardiomyopathy. Doubt pneumonia at this time -diuresis as you doing -entresto -cardiology consult -procalcitonin  AFIB -continue amiodarone and Xarelto -Metoprolol -monitoring electrolytes  Pulmonary fibrosis, UIP), tried to start esbriet 12/20, not on it -wean fio2 as tolerated -continue his rheumatological meds -reconsider esbriet when stable  Mediastinal adenopathy probable reactive -ace level -following  Copd, ex smoker -dulera, atrovent as ordered -keep sats >92 % -incentive spiro     I have personally obtained a history, examined the patient, evaluated laboratory and imaging  results, formulated the assessment and plan and placed orders.  The Patient requires high complexity decision making for assessment and support, frequent evaluation and titration of therapies, application of advanced monitoring technologies and extensive interpretation of multiple databases.   Carollynn Pennywell,M.D. Pulmonary & Critical care Medicine [facility]

## 2019-12-10 NOTE — Plan of Care (Signed)
  Problem: Education: Goal: Ability to verbalize understanding of medication therapies will improve Outcome: Progressing   

## 2019-12-11 DIAGNOSIS — J8410 Pulmonary fibrosis, unspecified: Secondary | ICD-10-CM | POA: Diagnosis not present

## 2019-12-11 DIAGNOSIS — I5023 Acute on chronic systolic (congestive) heart failure: Secondary | ICD-10-CM | POA: Diagnosis not present

## 2019-12-11 DIAGNOSIS — I4891 Unspecified atrial fibrillation: Secondary | ICD-10-CM | POA: Diagnosis not present

## 2019-12-11 LAB — CBC
HCT: 40.5 % (ref 39.0–52.0)
Hemoglobin: 13.3 g/dL (ref 13.0–17.0)
MCH: 27.5 pg (ref 26.0–34.0)
MCHC: 32.8 g/dL (ref 30.0–36.0)
MCV: 83.9 fL (ref 80.0–100.0)
Platelets: 239 10*3/uL (ref 150–400)
RBC: 4.83 MIL/uL (ref 4.22–5.81)
RDW: 15.1 % (ref 11.5–15.5)
WBC: 7 10*3/uL (ref 4.0–10.5)
nRBC: 0 % (ref 0.0–0.2)

## 2019-12-11 LAB — BASIC METABOLIC PANEL
Anion gap: 11 (ref 5–15)
BUN: 17 mg/dL (ref 8–23)
CO2: 32 mmol/L (ref 22–32)
Calcium: 8.2 mg/dL — ABNORMAL LOW (ref 8.9–10.3)
Chloride: 94 mmol/L — ABNORMAL LOW (ref 98–111)
Creatinine, Ser: 1.03 mg/dL (ref 0.61–1.24)
GFR, Estimated: >60 mL/min (ref >60)
Glucose, Bld: 92 mg/dL (ref 70–99)
Potassium: 3.4 mmol/L — ABNORMAL LOW (ref 3.5–5.1)
Sodium: 137 mmol/L (ref 135–145)

## 2019-12-11 MED ORDER — LACTULOSE 10 GM/15ML PO SOLN
30.0000 g | Freq: Three times a day (TID) | ORAL | Status: DC
  Administered 2019-12-11 – 2019-12-13 (×4): 30 g via ORAL
  Filled 2019-12-11 (×4): qty 60

## 2019-12-11 MED ORDER — DOCUSATE SODIUM 100 MG PO CAPS
200.0000 mg | ORAL_CAPSULE | Freq: Two times a day (BID) | ORAL | Status: DC
  Administered 2019-12-11 – 2019-12-13 (×3): 200 mg via ORAL
  Filled 2019-12-11 (×3): qty 2

## 2019-12-11 MED ORDER — METHYLPREDNISOLONE SODIUM SUCC 40 MG IJ SOLR
40.0000 mg | Freq: Three times a day (TID) | INTRAMUSCULAR | Status: DC
  Administered 2019-12-11 – 2019-12-13 (×6): 40 mg via INTRAVENOUS
  Filled 2019-12-11 (×8): qty 1

## 2019-12-11 MED ORDER — POTASSIUM CHLORIDE CRYS ER 20 MEQ PO TBCR
40.0000 meq | EXTENDED_RELEASE_TABLET | Freq: Once | ORAL | Status: AC
  Administered 2019-12-11: 40 meq via ORAL
  Filled 2019-12-11: qty 2

## 2019-12-11 MED ORDER — MIDODRINE HCL 5 MG PO TABS
10.0000 mg | ORAL_TABLET | Freq: Once | ORAL | Status: AC
  Administered 2019-12-11: 10 mg via ORAL
  Filled 2019-12-11: qty 2

## 2019-12-11 NOTE — Progress Notes (Signed)
Per Dr. Jimmye Norman stop the Lasix drip while the patients blood pressure is low.

## 2019-12-11 NOTE — Progress Notes (Signed)
PROGRESS NOTE    [name]  NTZ:[id number] DOB: [birth year] DOA: 12/08/2019 PCP: Jason Sizer, MD   Assessment & Plan:   Principal Problem:   Atrial fibrillation with RVR ([place]) Active Problems:   HTN (hypertension)   Interstitial lung disease ([place])   Chronic systolic CHF (congestive heart failure) (HCC)   Rheumatoid arthritis involving multiple sites with positive rheumatoid factor (HCC)   History of alcoholism ([place])   Hypokalemia   Pulmonary fibrosis (HCC)   Slurred speech   Chest pain   Elevated troponin   Atrial fibrillation (HCC)   Atrial fibrillation: w/ RVR. Most likely due to medication noncompliance. Continue on home dose of amiodarone, xarelto, metoprolol. Keep MAP >65. Continue on tele   Acute on chronic systolic CHF: echo is worse w/ EF 25-30% LV global hypokinesis, diastolic parameters are indeterminate, mild to mod MR. Continue on IV lasix, spironolactone, irbesartan. Neg approx 2.8L. Strict I/Os & daily weights. Fluid restriction. Cardio following and recs apprec   Acute hypoxic respiratory failure: likely secondary to CHF & pulmonary fibrosis. Continue on supplemental oxygen and wean as tolerated, currently on HFNC.   Interstitial lung disease and pulmonary fibrosis: continue on bronchodilators and encourage incentive spirometry. Pulmon following and recs apprec   Chest pain: w/ elevated troponins, likely secondary to demand ischemia from rapid a. fib. Cardio recs apprec  HTN: continue on aldacone, irbesartan, metoprolol. IV hydralazine prn   History of alcoholism: not currently scoring on CIWA so this will be d/c   Hypokalemia: KCl repleted. Will continue to monitor   Slurred speech: resolved. MRI brain neg for CVA   Rheumatoid arthritis: continue on home dose of leflunomide. Started on IV steroids     DVT prophylaxis: xarelto Code Status: full  Family Communication: discussed pt's care w/ pt's sister, Jason Fitzgerald and answered her questions   Disposition Plan: depends on PT/OT recs (not consulted yet)   Status is: Inpatient  Remains inpatient appropriate because:Ongoing diagnostic testing needed not appropriate for outpatient work up, Unsafe d/c plan and IV treatments appropriate due to intensity of illness or inability to take PO   Dispo: The patient is from: Home              Anticipated d/c is to: Home              Anticipated d/c date is: 3 days              Patient currently is not medically stable to d/c.      Consultants:   Cardio  pulmon   Procedures:    Antimicrobials:    Subjective: Pt c/o b/l hand pain   Objective: Vitals:   12/11/19 0109 12/11/19 0531 12/11/19 0533 12/11/19 0732  BP: (!) 89/67 101/84  101/72  Pulse: 88 91  (!) 110  Resp:    17  Temp: 99.3 F (37.4 C) 98.6 F (37 C)  98.5 F (36.9 C)  TempSrc: Oral Oral  Oral  SpO2: 100% (!) 71%  100%  Weight:   85.1 kg   Height:        Intake/Output Summary (Last 24 hours) at 12/11/2019 0743 Last data filed at 12/11/2019 0733 Gross per 24 hour  Intake 780 ml  Output 3650 ml  Net -2870 ml   Filed Weights   12/09/19 0607 12/10/19 0356 12/11/19 0533  Weight: 87.5 kg 85.8 kg 85.1 kg    Examination:  General exam: Appears calm but uncomfortable Respiratory system: diminished breath sounds b/l  Cardiovascular system: irregularly irregular. No rubs or gallops Gastrointestinal system: Abd is soft, non-distended, non-tender, hypoactive bowel sounds  Central nervous system: alert and oriented. Moves all 4 extremities  Psychiatry: Judgement and insight appear normal. Flat mood and affect.     Data Reviewed: I have personally reviewed following labs and imaging studies  CBC: Recent Labs  Lab 12/08/19 0700 12/09/19 0414 12/10/19 0433 12/11/19 0411  WBC 5.5 7.0 8.1 7.0  HGB 13.1 12.0* 13.1 13.3  HCT 39.9 36.9* 38.9* 40.5  MCV 83.0 83.3 82.9 83.9  PLT 284 256 238 149   Basic Metabolic Panel: Recent Labs  Lab  12/08/19 0700 12/09/19 0414 12/10/19 0433 12/11/19 0411  NA 132* 135 136 137  K 2.7* 3.5 3.2* 3.4*  CL 92* 100 99 94*  CO2 28 25 27  32  GLUCOSE 109* 160* 129* 92  BUN 9 16 17 17   CREATININE 0.79 1.00 0.95 1.03  CALCIUM 8.4* 7.8* 8.1* 8.2*  MG 1.9  --   --   --    GFR: Estimated Creatinine Clearance: 75.3 mL/min (by C-G formula based on SCr of 1.03 mg/dL). Liver Function Tests: No results for input(s): AST, ALT, ALKPHOS, BILITOT, PROT, ALBUMIN in the last 168 hours. No results for input(s): LIPASE, AMYLASE in the last 168 hours. No results for input(s): AMMONIA in the last 168 hours. Coagulation Profile: No results for input(s): INR, PROTIME in the last 168 hours. Cardiac Enzymes: No results for input(s): CKTOTAL, CKMB, CKMBINDEX, TROPONINI in the last 168 hours. BNP (last 3 results) No results for input(s): PROBNP in the last 8760 hours. HbA1C: No results for input(s): HGBA1C in the last 72 hours. CBG: No results for input(s): GLUCAP in the last 168 hours. Lipid Profile: Recent Labs    12/09/19 0414  CHOL 111  HDL 41  LDLCALC 57  TRIG 66  CHOLHDL 2.7   Thyroid Function Tests: Recent Labs    12/08/19 0958 12/09/19 0414  TSH 0.104*  --   FREET4  --  1.81*  T3FREE  --  1.8*   Anemia Panel: No results for input(s): VITAMINB12, FOLATE, FERRITIN, TIBC, IRON, RETICCTPCT in the last 72 hours. Sepsis Labs: Recent Labs  Lab 12/10/19 0433  PROCALCITON <0.10    Recent Results (from the past 240 hour(s))  Respiratory Panel by RT PCR (Flu A&B, Covid) - Nasopharyngeal Swab     Status: None   Collection Time: 12/08/19  7:47 AM   Specimen: Nasopharyngeal Swab  Result Value Ref Range Status   SARS Coronavirus 2 by RT PCR NEGATIVE NEGATIVE Final    Comment: (NOTE) SARS-CoV-2 target nucleic acids are NOT DETECTED.  The SARS-CoV-2 RNA is generally detectable in upper respiratoy specimens during the acute phase of infection. The lowest concentration of SARS-CoV-2 viral  copies this assay can detect is 131 copies/mL. A negative result does not preclude SARS-Cov-2 infection and should not be used as the sole basis for treatment or other patient management decisions. A negative result may occur with  improper specimen collection/handling, submission of specimen other than nasopharyngeal swab, presence of viral mutation(s) within the areas targeted by this assay, and inadequate number of viral copies (<131 copies/mL). A negative result must be combined with clinical observations, patient history, and epidemiological information. The expected result is Negative.  Fact Sheet for Patients:  PinkCheek.be  Fact Sheet for Healthcare Providers:  GravelBags.it  This test is no t yet approved or cleared by the [place] and  has been authorized for  detection and/or diagnosis of SARS-CoV-2 by FDA under an Emergency Use Authorization (EUA). This EUA will remain  in effect (meaning this test can be used) for the duration of the COVID-19 declaration under Section 564(b)(1) of the Act, 21 U.S.C. section 360bbb-3(b)(1), unless the authorization is terminated or revoked sooner.     Influenza A by PCR NEGATIVE NEGATIVE Final   Influenza B by PCR NEGATIVE NEGATIVE Final    Comment: (NOTE) The Xpert Xpress SARS-CoV-2/FLU/RSV assay is intended as an aid in  the diagnosis of influenza from Nasopharyngeal swab specimens and  should not be used as a sole basis for treatment. Nasal washings and  aspirates are unacceptable for Xpert Xpress SARS-CoV-2/FLU/RSV  testing.  Fact Sheet for Patients: PinkCheek.be  Fact Sheet for Healthcare Providers: GravelBags.it  This test is not yet approved or cleared by the [place] FDA and  has been authorized for detection and/or diagnosis of SARS-CoV-2 by  FDA under an Emergency Use Authorization (EUA). This  EUA will remain  in effect (meaning this test can be used) for the duration of the  Covid-19 declaration under Section 564(b)(1) of the Act, 21  U.S.C. section 360bbb-3(b)(1), unless the authorization is  terminated or revoked. Performed at [facility], [address], [place],  [address]          Radiology Studies: CT ANGIO CHEST PE W OR WO CONTRAST  Result Date: 12/09/2019 CLINICAL DATA:  Inpatient. Rheumatoid arthritis. COPD. Pulmonary fibrosis. Chest pain and dyspnea. Nonproductive cough. Arrhythmia. EXAM: CT ANGIOGRAPHY CHEST WITH CONTRAST TECHNIQUE: Multidetector CT imaging of the chest was performed using the standard protocol during bolus administration of intravenous contrast. Multiplanar CT image reconstructions and MIPs were obtained to evaluate the vascular anatomy. CONTRAST:  65mL OMNIPAQUE IOHEXOL 350 MG/ML SOLN COMPARISON:  08/05/2018 chest radiograph. 12/17/2018 chest CT angiogram. FINDINGS: Cardiovascular: The study is moderate quality for the evaluation of pulmonary embolism, with motion degradation. There are no convincing filling defects in the central, lobar, segmental or subsegmental pulmonary artery branches to suggest acute pulmonary embolism. Atherosclerotic nonaneurysmal thoracic aorta. Normal caliber pulmonary arteries. Normal heart size. No significant pericardial fluid/thickening. Left anterior descending coronary atherosclerosis. Mediastinum/Nodes: No discrete thyroid nodules. Unremarkable esophagus. No axillary adenopathy. New mild bilateral mediastinal and bilateral hilar lymphadenopathy. Representative 1.2 cm AP window node (series 4/image 47), 1.2 cm right subcarinal node (series 4/image 65), 1.5 cm right hilar node (series 4/image 57) and 1.2 cm left hilar node (series 4/image 57). Lungs/Pleura: No pneumothorax. Small dependent bilateral pleural effusions, right greater than left. No lung masses or significant pulmonary nodules. Patchy  consolidation and ground-glass opacity throughout the bilateral upper lobes is new, most prominent in the posterior left upper lobe. Background of severe patchy confluent subpleural reticulation and severe honeycombing with associated volume loss, parenchymal distortion and mild traction bronchiectasis with a basilar predominance, not appreciably changed. Upper abdomen: Contrast reflux into the IVC and hepatic veins. Simple 2.4 cm peripheral right liver cyst. Musculoskeletal: No aggressive appearing focal osseous lesions. Marked bilateral gynecomastia, unchanged. Review of the MIP images confirms the above findings. IMPRESSION: 1. Motion degraded scan. No evidence of pulmonary embolism. 2. New patchy consolidation and ground-glass opacity throughout the bilateral upper lobes, most prominent in the posterior left upper lobe. Given the small dependent bilateral pleural effusions and the contrast reflux into the IVC and hepatic veins with reported history of CHF, differential for these upper lobe pulmonary opacities includes acute cardiogenic pulmonary edema versus multilobar pneumonia. 3. New mild bilateral mediastinal  and bilateral hilar lymphadenopathy, nonspecific, potentially reactive. Suggest attention on follow-up chest CT with IV contrast in 3-6 months. 4. Background of severe basilar predominant fibrotic interstitial lung disease with marked honeycombing, not appreciably changed. Findings are consistent with UIP per consensus guidelines: Diagnosis of Idiopathic Pulmonary Fibrosis: An Official ATS/ERS/JRS/ALAT Clinical Practice Guideline. [place], Iss 5, [phone], Oct 03 2016. 5. One vessel coronary atherosclerosis. 6. Aortic Atherosclerosis (ICD10-I70.0). Electronically Signed   By: Ilona Sorrel M.D.   On: 12/09/2019 09:02   ECHOCARDIOGRAM COMPLETE  Result Date: 12/09/2019    ECHOCARDIOGRAM REPORT   Patient Name:   HUGHIE MELROY Date of Exam: 12/09/2019 Medical Rec #:  [id number]        Height:       69.0 in Accession #:    [id number]      Weight:       192.9 lb Date of Birth:  1951/01/08      BSA:          2.035 m Patient Age:    12 years        BP:           98/59 mmHg Patient Gender: M               HR:           84 bpm. Exam Location:  [facility] Procedure: 2D Echo Indications:     Chest Pain 786.50 / R07.9  History:         Patient has prior history of Echocardiogram examinations, most                  recent 09/01/2019. CHF, COPD; Risk Factors:Hypertension.  Sonographer:     Avanell Shackleton Referring Phys:  Unknown Foley NIU Diagnosing Phys: Kate Sable MD IMPRESSIONS  1. Left ventricular ejection fraction, by estimation, is 25 to 30%. Left ventricular ejection fraction by 2D MOD biplane is 26.1 %. The left ventricle has severely decreased function. The left ventricle demonstrates global hypokinesis. Left ventricular diastolic parameters are indeterminate.  2. Right ventricular systolic function is moderately reduced. The right ventricular size is normal. There is moderately elevated pulmonary artery systolic pressure.  3. Left atrial size was mildly dilated.  4. The mitral valve is grossly normal. Mild to moderate mitral valve regurgitation.  5. The aortic valve is tricuspid. Aortic valve regurgitation is mild.  6. The inferior vena cava is dilated in size with <50% respiratory variability, suggesting right atrial pressure of 15 mmHg. FINDINGS  Left Ventricle: Left ventricular ejection fraction, by estimation, is 25 to 30%. Left ventricular ejection fraction by 2D MOD biplane is 26.1 %. The left ventricle has severely decreased function. The left ventricle demonstrates global hypokinesis. The left ventricular internal cavity size was normal in size. There is no left ventricular hypertrophy. Left ventricular diastolic parameters are indeterminate. Right Ventricle: The right ventricular size is normal. No increase in right ventricular wall thickness. Right ventricular systolic function is  moderately reduced. There is moderately elevated pulmonary artery systolic pressure. The tricuspid regurgitant velocity is 3.35 m/s, and with an assumed right atrial pressure of 15 mmHg, the estimated right ventricular systolic pressure is 09.6 mmHg. Left Atrium: Left atrial size was mildly dilated. Right Atrium: Right atrial size was normal in size. Pericardium: There is no evidence of pericardial effusion. Mitral Valve: The mitral valve is grossly normal. Mild to moderate mitral valve regurgitation. Tricuspid Valve: The tricuspid valve is normal in structure.  Tricuspid valve regurgitation is not demonstrated. Aortic Valve: The aortic valve is tricuspid. Aortic valve regurgitation is mild. Pulmonic Valve: The pulmonic valve was not well visualized. Pulmonic valve regurgitation is not visualized. Aorta: The aortic root is normal in size and structure. Venous: The inferior vena cava is dilated in size with less than 50% respiratory variability, suggesting right atrial pressure of 15 mmHg. IAS/Shunts: No atrial level shunt detected by color flow Doppler.  LEFT VENTRICLE PLAX 2D                        Biplane EF (MOD) LVIDd:         5.33 cm         LV Biplane EF:   Left LVIDs:         4.79 cm                          ventricular LV PW:         0.86 cm                          ejection LV IVS:        0.82 cm                          fraction by LVOT diam:     2.00 cm                          2D MOD LVOT Area:     3.14 cm                         biplane is                                                 26.1 %.  LV Volumes (MOD) LV vol d, MOD    230.0 ml A2C: LV vol d, MOD    146.0 ml A4C: LV vol s, MOD    164.0 ml A2C: LV vol s, MOD    107.0 ml A4C: LV SV MOD A2C:   66.0 ml LV SV MOD A4C:   146.0 ml LV SV MOD BP:    48.0 ml RIGHT VENTRICLE            IVC RV S prime:     7.10 cm/s  IVC diam: 2.36 cm TAPSE (M-mode): 1.2 cm LEFT ATRIUM             Index       RIGHT ATRIUM           Index LA diam:        4.80 cm 2.36 cm/m   RA Area:     15.50 cm LA Vol (A2C):   93.9 ml 46.15 ml/m RA Volume:   39.00 ml  19.17 ml/m LA Vol (A4C):   46.1 ml 22.66 ml/m LA Biplane Vol: 68.2 ml 33.52 ml/m   AORTA Ao Root diam: 3.00 cm MR Peak grad: 61.1 mmHg   TRICUSPID VALVE MR Mean grad: 38.5 mmHg   TR Peak grad:   44.9 mmHg MR Vmax:      390.75 cm/s TR Vmax:  335.00 cm/s MR Vmean:     291.2 cm/s                           SHUNTS                           Systemic Diam: 2.00 cm Kate Sable MD Electronically signed by Kate Sable MD Signature Date/Time: 12/09/2019/2:08:38 PM    Final         Scheduled Meds: . amiodarone  200 mg Oral Daily  . aspirin EC  325 mg Oral Daily  . atorvastatin  10 mg Oral Daily  . folic acid  1 mg Oral Daily  . ipratropium  2 puff Inhalation Q4H  . leflunomide  10 mg Oral Daily  . LORazepam  0-4 mg Intravenous Q12H  . metoprolol succinate  100 mg Oral Daily  . metoprolol succinate  50 mg Oral Once  . mometasone-formoterol  2 puff Inhalation BID  . multivitamin with minerals  1 tablet Oral Daily  . potassium chloride  40 mEq Oral Once  . predniSONE  5 mg Oral Daily  . rivaroxaban  20 mg Oral Q supper  . sacubitril-valsartan  1 tablet Oral BID  . spironolactone  25 mg Oral Daily  . thiamine  100 mg Oral Daily   Continuous Infusions: . sodium chloride 500 mL (12/09/19 2348)  . albumin human 25 g (12/11/19 0544)  . furosemide (LASIX) 200 mg in dextrose 5% 100 mL (2mg /mL) infusion 20 mg/hr (12/10/19 2106)     LOS: 2 days    Time spent: 31 mins     Wyvonnia Dusky, MD [facility] Pager [phone]  If 7PM-7AM, please contact night-coverage [email or web address] 12/11/2019, 7:43 AM

## 2019-12-11 NOTE — Progress Notes (Signed)
   12/10/19 2037  Document  Progress note created (see row info) Yes

## 2019-12-11 NOTE — Progress Notes (Signed)
Mobility Specialist - Progress Note   12/11/19 1235  Mobility  Activity Refused mobility  Mobility performed by Mobility specialist    Pt refused session at this time. States he has pain in his "hands" and his "legs". When asked to rate his pain, pt states "when I say it's hurting, then it's hurting". Nurse was notified. Will re-attempt at a later date when pt is feeling appropriate.    Bertha Lokken Mobility Specialist  12/11/19, 12:36 PM

## 2019-12-11 NOTE — Progress Notes (Signed)
BP 92/60. Lasix gtt remains off. Asymptomatic.

## 2019-12-11 NOTE — Progress Notes (Signed)
Date: 12/11/2019,   MRN# [id number] CRISTINA MATTERN 1950-06-15 Code Status:     Code Status Orders  (From admission, onward)         Start     Ordered   12/08/19 1431  Full code  Continuous        12/08/19 1430        Code Status History    Date Active Date Inactive Code Status Order ID Comments User Context   08/05/2019 0423 08/07/2019 2106 Full Code [id number]  Christel Mormon, MD ED   12/17/2018 1453 12/20/2018 1529 Full Code [id number]  Para Skeans, MD ED   02/03/2018 1520 02/05/2018 1811 Full Code [id number]  Saundra Shelling, MD ED   03/04/2017 1231 03/06/2017 1707 Full Code [id number]  Demetrios Loll, MD ED   10/26/2014 1128 10/26/2014 1500 Full Code [id number]  Hubbard Robinson, MD Outpatient   Advance Care Planning Activity      HPI: f/u visit, no new pulmonary sxs. See ,last  note  PMHX:   Past Medical History:  Diagnosis Date  . CHF (congestive heart failure) ([place])   . Chronic cough   . COPD (chronic obstructive pulmonary disease) ([place])   . Elevated liver function tests   . Emphysema of lung ([place])   . Fibrosis, pulmonary, interstitial, diffuse ([place])   . GERD (gastroesophageal reflux disease)   . History of cocaine abuse ([place])   . Mediastinal lymphadenopathy   . Pulmonary fibrosis ([place])   . Right inguinal hernia    Surgical Hx:  Past Surgical History:  Procedure Laterality Date  . COLONOSCOPY WITH PROPOFOL N/A 08/19/2018   Procedure: COLONOSCOPY WITH PROPOFOL;  Surgeon: Virgel Manifold, MD;  Location: [facility] ENDOSCOPY;  Service: Endoscopy;  Laterality: N/A;  . HERNIA REPAIR Right 1973   open  . INGUINAL HERNIA REPAIR Right 11/08/2014   Procedure: LAPAROSCOPIC RIGHT INGUINAL HERNIA REPAIR;  Surgeon: Hubbard Robinson, MD;  Location: [facility] ORS;  Service: General;  Laterality: Right;  . knee arthroscopy Right 1972  . RIGHT/LEFT HEART CATH AND CORONARY ANGIOGRAPHY Right 03/05/2017   Procedure: RIGHT/LEFT HEART CATH AND CORONARY ANGIOGRAPHY;  Surgeon: Dionisio David, MD;   Location: [place] [facility] LAB;  Service: Cardiovascular;  Laterality: Right;   Family Hx:  Family History  Problem Relation Age of Onset  . Diabetes Mother   . Cancer Father   . AAA (abdominal aortic aneurysm) Brother    Social Hx:   Social History   Tobacco Use  . Smoking status: Never Smoker  . Smokeless tobacco: Never Used  Vaping Use  . Vaping Use: Never used  Substance Use Topics  . Alcohol use: Not Currently    Alcohol/week: 0.0 standard drinks    Comment: 2 quarts a week, he quit again 12/2018  . Drug use: Not Currently    Types: Cocaine    Comment: as an young adult    Medication:    Home Medication:    Current Medication: @CURMEDTAB @   Allergies:  Patient has no known allergies.  Review of Systems: Gen:  Denies  fever, sweats, chills HEENT: Denies blurred vision, double vision, ear pain, eye pain, hearing loss, nose bleeds, sore throat Cvc:  No dizziness, chest pain or heaviness Resp:less sob    Gi: Denies swallowing difficulty, stomach pain, nausea or vomiting, diarrhea, constipation, bowel incontinence Gu:  Denies bladder incontinence, burning urine Ext:   No Joint pain, stiffness or swelling Skin: No skin rash, easy bruising or bleeding or hives  Endoc:  No polyuria, polydipsia , polyphagia or weight change Psych: No depression, insomnia or hallucinations  Other:  All other systems negative  Physical Examination:   VS: BP (!) 84/66 (BP Location: Left Arm)   Pulse 94   Temp 98.5 F (36.9 C) (Axillary)   Resp 19   Ht 5\' 9"  (1.753 m)   Wt 85.1 kg   SpO2 100%   BMI 27.70 kg/m   General Appearance: No distress , sleeping  sats 100 % Neuro: without focal findings, mental status, speech normal, alert and oriented, cranial nerves 2-12 intact, reflexes normal and symmetric, sensation grossly normal  HEENT: PERRLA, EOM intact, no ptosis, no other lesions noticed, Mallampati: Pulmonary:.No wheezing, distant    rales   Cardiovascular:  Irreg,  No m/r/g.   No rub Abdomen:Benign, Soft, non-tender, No masses, hepatosplenomegaly, No lymphadenopathy Endoc: No evident thyromegaly, no signs of acromegaly or Cushing features Skin:   warm, no rashes, no ecchymosis  Extremities: normal, no cyanosis, clubbing, no edema, warm with normal capillary refill. Other findings:   Labs results:   Recent Labs    12/09/19 0414 12/10/19 0433 12/11/19 0411  HGB 12.0* 13.1 13.3  HCT 36.9* 38.9* 40.5  MCV 83.3 82.9 83.9  WBC 7.0 8.1 7.0  BUN 16 17 17   CREATININE 1.00 0.95 1.03  GLUCOSE 160* 129* 92  CALCIUM 7.8* 8.1* 8.2*  ,  procalcitonin < 0.10  Assessment and Plan: This is an an ex smoker, etoh abuser, presented in afib with rvr, better control now, non complaince, copd, uip. Cardiomyopathy and secondary htn. His dyspnea is multifactorial. Seem to be more congestive heart failure at this time. Did not think there is amiodarone toxicity, since he has been non compliant with his meds.   Cardiomyopathy, ? Alcohol cardiomyopathy, pulmonary edema, effusions, ef 30%, nl coronary arteries 2019. . Doubt pneumonia at this time.   Marginal  b/p -diuresis as you doing (lasix/albumen) -entresto -cardiology following  AFIB -continue amiodarone and Xarelto -Metoprolol -monitoring electrolytes  Pulmonary fibrosis, UIP), tried to start esbriet 12/20, not on it -wean fio2 as tolerated -continue his rheumatological meds -reconsider esbriet when stable  Mediastinal adenopathy probable reactive -ace level pending -following  Copd, ex smoker -dulera, atrovent as ordered -keep sats >92 % -incentive spiro      I have personally obtained a history, examined the patient, evaluated laboratory and imaging results, formulated the assessment and plan and placed orders.  The Patient requires high complexity decision making for assessment and support, frequent evaluation and titration of therapies, application of advanced monitoring technologies and  extensive interpretation of multiple databases.   Irwin Toran,M.D. Pulmonary & Critical care Medicine [facility]

## 2019-12-11 NOTE — Progress Notes (Signed)
   12/11/19 1551  Assess: MEWS Score  BP (!) 69/49   Pt BP is low. MD notified via secure chat.

## 2019-12-11 NOTE — Plan of Care (Signed)
  Problem: Clinical Measurements: Goal: Respiratory complications will improve Outcome: Not Progressing   

## 2019-12-11 NOTE — Evaluation (Signed)
Occupational Therapy Evaluation Patient Details Name: Jason Fitzgerald MRN: [id number] DOB: [birth year] Today's Date: 12/11/2019    History of Present Illness Pt is 69 y/o M with PMH: COPD, pulmonary fibrosis, CHF, alcohol abuse, and Afib. Presented d/t feeling chest pain, SOB, and dizziness x2-3 days. Pt also reported some slurred speech whcih has resolved and MRI was negative. Pt was placed on CIWA d/t alc w/d. Pt found to be in Afib with RVR, likely d/t medication noncompliance per MD note.   Clinical Impression   Pt was seen for OT evaluation this date. Prior to hospital admission, pt was INDEP with self care ADLs and fxl mobility with no use of AD and occasional use of shower stool for bathing. Pt reports driving and getting his own groceries at baseline. Pt lives alone in apt with level entry. Currently pt demonstrates impairments as described below (See OT problem list) which functionally limit his ability to perform ADL/self-care tasks. Pt currently requires SETUP with bed level UB ADLs and MAX A With bed level UB ADLs. Pt declines to attempt transition to sitting at this time citing arthritis pain, but in addition, pt significantly low BP noted (MAP goal is 65 and it was 60 on OT assessment).  Pt would benefit from skilled OT services to address noted impairments and functional limitations (see below for any additional details) in order to maximize safety and independence while minimizing falls risk and caregiver burden. Upon hospital discharge, recommend STR as pt currently presenting with significantly decreased fxl activity tolerance. Will continue to assess for more appropriate d/c recommendation.     Follow Up Recommendations  SNF    Equipment Recommendations  Other (comment) (2WW)    Recommendations for Other Services       Precautions / Restrictions Precautions Precautions: Fall Restrictions Weight Bearing Restrictions: No Other Position/Activity Restrictions: monitor BP, MAP  goal is 65 per MD note. BP on OT assessment was 77/62 with MAP of 60.      Mobility Bed Mobility               General bed mobility comments: pt declines to get OOB on OT assessment    Transfers                 General transfer comment: pt declines, in addition, BP too low.  MAP goal is 65 per MD note. BP on OT assessment was 77/62 with MAP of 60.    Balance                                           ADL either performed or assessed with clinical judgement   ADL Overall ADL's : Needs assistance/impaired                                       General ADL Comments: requires SETUP for bed-level UB ADLs (HOB elevated), MAX A for bed-level LB ADLs. Declines to attempt ADL transfers at this time citing arthritis pain, but in addition, pt with low BP (MAP goal is 65, was 60 during OT's assessment)     Vision Baseline Vision/History: Wears glasses Wears Glasses: At all times Patient Visual Report: No change from baseline       Perception     Praxis  Pertinent Vitals/Pain Pain Assessment: 0-10 Pain Score: 6  Pain Location: hands and feet, reports chronic with h/o arthritis, reports slightly worse than usual at this time. Pain Descriptors / Indicators: Aching;Tender Pain Intervention(s): Limited activity within patient's tolerance     Hand Dominance Left   Extremity/Trunk Assessment Upper Extremity Assessment Upper Extremity Assessment: RUE deficits/detail;LUE deficits/detail RUE Deficits / Details: shld AROM to ~3/4 range. Elbow and grip strength grossly 4-/5 LUE Deficits / Details: shld AROM to ~1/2 range. Pt reports being L handed, but arthritis is worse in L hand than R. elbow and grip strength frossly 3+/5   Lower Extremity Assessment Lower Extremity Assessment: Defer to PT evaluation;Generalized weakness       Communication Communication Communication: No difficulties   Cognition Arousal/Alertness:  Awake/alert Behavior During Therapy: WFL for tasks assessed/performed Overall Cognitive Status: Within Functional Limits for tasks assessed                                 General Comments: reluctant to mobilize, requires motivation   General Comments  NT    Exercises Other Exercises Other Exercises: OT faciltiates pt education re: role of OT in acute setting, importance of OOB activity, BP monitoring as it pertains to fall prevention. And education re: some bed level exervcises to prevent atrophy such as glute squeezes, contralateral reaching, and reaching towards toes for core strengthening. Pt with moderate reception of education.   Shoulder Instructions      Home Living Family/patient expects to be discharged to:: Private residence Living Arrangements: Alone Available Help at Discharge: Neighbor;Available PRN/intermittently (neigbor checking on his home while he is gone.) Type of Home: Apartment Home Access: Level entry     Home Layout: One level               Home Equipment: Shower seat;Bedside commode (BSC over toilet to elevate)          Prior Functioning/Environment Level of Independence: Independent with assistive device(s)        Comments: reports no use of RW at baseline, ambulates in [facility] and community. Reports he drives and gets own groceries. Endorses some falls when his K+ is low. Uses shower stool occasionally.        OT Problem List: Decreased strength;Decreased activity tolerance;Impaired balance (sitting and/or standing);Decreased knowledge of use of DME or AE;Cardiopulmonary status limiting activity;Pain      OT Treatment/Interventions: Self-care/ADL training;DME and/or AE instruction;Therapeutic activities;Balance training;Therapeutic exercise;Patient/family education    OT Goals(Current goals can be found in the care plan section) Acute Rehab OT Goals Patient Stated Goal: to go home OT Goal Formulation: With patient Time For Goal  Achievement: 12/25/19 Potential to Achieve Goals: Good ADL Goals Pt Will Perform Upper Body Dressing: sitting;with modified independence Pt Will Perform Lower Body Dressing: with min assist;sit to/from stand Pt Will Transfer to Toilet: with min assist;bedside commode Pt Will Perform Toileting - Clothing Manipulation and hygiene: with min assist;sit to/from stand  OT Frequency: Min 1X/week   Barriers to D/C:            Co-evaluation              AM-PAC OT "6 Clicks" Daily Activity     Outcome Measure Help from another person eating meals?: None Help from another person taking care of personal grooming?: A Little Help from another person toileting, which includes using toliet, bedpan, or urinal?: A Lot Help from another person  bathing (including washing, rinsing, drying)?: A Lot Help from another person to put on and taking off regular upper body clothing?: A Little Help from another person to put on and taking off regular lower body clothing?: A Lot 6 Click Score: 16   End of Session Equipment Utilized During Treatment: Gait belt;Rolling walker Nurse Communication: Mobility status  Activity Tolerance: Patient tolerated treatment well Patient left: in bed;with call bell/phone within reach;with bed alarm set  OT Visit Diagnosis: Unsteadiness on feet (R26.81);Muscle weakness (generalized) (M62.81)                Time: 3354-5625 OT Time Calculation (min): 24 min Charges:  OT General Charges $OT Visit: 1 Visit OT Evaluation $OT Eval Moderate Complexity: 1 Mod OT Treatments $Self Care/Home Management : 8-22 mins  Gerrianne Scale, MS, OTR/L ascom [phone] 12/11/19, 4:20 PM

## 2019-12-11 NOTE — Progress Notes (Signed)
   12/10/19 2158  Assess: MEWS Score  Temp 98.7 F (37.1 C)  BP (!) 85/64  Pulse Rate 84  SpO2 94 %  Assess: MEWS Score  MEWS Temp 0  MEWS Systolic 1  MEWS Pulse 0  MEWS RR 2  MEWS LOC 0  MEWS Score 3  MEWS Score Color Yellow  Assess: if the MEWS score is Yellow or Red  Were vital signs taken at a resting state? Yes  Focused Assessment No change from prior assessment  Early Detection of Sepsis Score *See Row Information* Low  MEWS guidelines implemented *See Row Information* No, previously red, continue vital signs every 4 hours  Treat  Pain Scale 0-10  Pain Score 0  Take Vital Signs  Increase Vital Sign Frequency  Yellow: Q 2hr X 2 then Q 4hr X 2, if remains yellow, continue Q 4hrs  Escalate  MEWS: Escalate Yellow: discuss with charge nurse/RN and consider discussing with provider and RRT  Notify: Charge Nurse/RN  Name of Charge Nurse/RN Notified [place]  Date Charge Nurse/RN Notified 12/10/19  Time Charge Nurse/RN Notified 2200  Notify: Provider  Provider Name/Title  (No provider notified)  Document  Patient Outcome Stabilized after interventions  Progress note created (see row info) Yes

## 2019-12-11 NOTE — Progress Notes (Signed)
BP 88/56. Lasix gtt remains off. Held PM dose of Entresto. Asymptomatic.

## 2019-12-11 NOTE — Progress Notes (Signed)
SUBJECTIVE: Patient denies any chest pain or shortness of breath   Vitals:   12/11/19 0800 12/11/19 1127 12/11/19 1207 12/11/19 1521  BP: 99/74 (!) 83/61 (!) 84/66 (!) 77/55  Pulse: (!) 55 96 94 80  Resp:  (!) 26 19 16   Temp:  98.6 F (37 C) 98.5 F (36.9 C) 98 F (36.7 C)  TempSrc:  Axillary Axillary Oral  SpO2:  100% 100% 95%  Weight:      Height:        Intake/Output Summary (Last 24 hours) at 12/11/2019 1530 Last data filed at 12/11/2019 1208 Gross per 24 hour  Intake 680 ml  Output 2450 ml  Net -1770 ml    LABS: Basic Metabolic Panel: Recent Labs    12/10/19 0433 12/11/19 0411  NA 136 137  K 3.2* 3.4*  CL 99 94*  CO2 27 32  GLUCOSE 129* 92  BUN 17 17  CREATININE 0.95 1.03  CALCIUM 8.1* 8.2*   Liver Function Tests: No results for input(s): AST, ALT, ALKPHOS, BILITOT, PROT, ALBUMIN in the last 72 hours. No results for input(s): LIPASE, AMYLASE in the last 72 hours. CBC: Recent Labs    12/10/19 0433 12/11/19 0411  WBC 8.1 7.0  HGB 13.1 13.3  HCT 38.9* 40.5  MCV 82.9 83.9  PLT 238 239   Cardiac Enzymes: No results for input(s): CKTOTAL, CKMB, CKMBINDEX, TROPONINI in the last 72 hours. BNP: Invalid input(s): POCBNP D-Dimer: No results for input(s): DDIMER in the last 72 hours. Hemoglobin A1C: No results for input(s): HGBA1C in the last 72 hours. Fasting Lipid Panel: Recent Labs    12/09/19 0414  CHOL 111  HDL 41  LDLCALC 57  TRIG 66  CHOLHDL 2.7   Thyroid Function Tests: Recent Labs    12/09/19 0414  T3FREE 1.8*   Anemia Panel: No results for input(s): VITAMINB12, FOLATE, FERRITIN, TIBC, IRON, RETICCTPCT in the last 72 hours.   PHYSICAL EXAM General: Well developed, well nourished, in no acute distress HEENT:  Normocephalic and atramatic Neck:  No JVD.  Lungs: Clear bilaterally to auscultation and percussion. Heart: HRRR . Normal S1 and S2 without gallops or murmurs.  Abdomen: Bowel sounds are positive, abdomen soft and non-tender   Msk:  Back normal, normal gait. Normal strength and tone for age. Extremities: No clubbing, cyanosis or edema.   Neuro: Alert and oriented X 3. Psych:  Good affect, responds appropriately  TELEMETRY: Sinus rhythm  ASSESSMENT AND PLAN: Paroxysmal atrial fibrillation with congestive heart failure.  Patient is gradually improving.  Principal Problem:   Atrial fibrillation with RVR (HCC) Active Problems:   HTN (hypertension)   Interstitial lung disease (HCC)   Chronic systolic CHF (congestive heart failure) (HCC)   Rheumatoid arthritis involving multiple sites with positive rheumatoid factor (HCC)   History of alcoholism (HCC)   Hypokalemia   Pulmonary fibrosis (HCC)   Slurred speech   Chest pain   Elevated troponin   Atrial fibrillation ([place])    Neoma Laming A, MD, [facility] 12/11/2019 3:30 PM

## 2019-12-12 DIAGNOSIS — J9601 Acute respiratory failure with hypoxia: Secondary | ICD-10-CM

## 2019-12-12 DIAGNOSIS — I5033 Acute on chronic diastolic (congestive) heart failure: Secondary | ICD-10-CM | POA: Diagnosis not present

## 2019-12-12 DIAGNOSIS — I4891 Unspecified atrial fibrillation: Secondary | ICD-10-CM | POA: Diagnosis not present

## 2019-12-12 LAB — CBC
HCT: 35.5 % — ABNORMAL LOW (ref 39.0–52.0)
Hemoglobin: 12 g/dL — ABNORMAL LOW (ref 13.0–17.0)
MCH: 27.8 pg (ref 26.0–34.0)
MCHC: 33.8 g/dL (ref 30.0–36.0)
MCV: 82.4 fL (ref 80.0–100.0)
Platelets: 245 10*3/uL (ref 150–400)
RBC: 4.31 MIL/uL (ref 4.22–5.81)
RDW: 14.9 % (ref 11.5–15.5)
WBC: 10.8 10*3/uL — ABNORMAL HIGH (ref 4.0–10.5)
nRBC: 0 % (ref 0.0–0.2)

## 2019-12-12 LAB — MAGNESIUM: Magnesium: 1.6 mg/dL — ABNORMAL LOW (ref 1.7–2.4)

## 2019-12-12 LAB — BASIC METABOLIC PANEL
Anion gap: 10 (ref 5–15)
BUN: 30 mg/dL — ABNORMAL HIGH (ref 8–23)
CO2: 29 mmol/L (ref 22–32)
Calcium: 7.6 mg/dL — ABNORMAL LOW (ref 8.9–10.3)
Chloride: 94 mmol/L — ABNORMAL LOW (ref 98–111)
Creatinine, Ser: 1.33 mg/dL — ABNORMAL HIGH (ref 0.61–1.24)
GFR, Estimated: 59 mL/min — ABNORMAL LOW (ref >60)
Glucose, Bld: 204 mg/dL — ABNORMAL HIGH (ref 70–99)
Potassium: 3.8 mmol/L (ref 3.5–5.1)
Sodium: 133 mmol/L — ABNORMAL LOW (ref 135–145)

## 2019-12-12 LAB — ANGIOTENSIN CONVERTING ENZYME: Angiotensin-Converting Enzyme: 19 U/L (ref 14–82)

## 2019-12-12 MED ORDER — MAGNESIUM SULFATE 2 GM/50ML IV SOLN
2.0000 g | Freq: Once | INTRAVENOUS | Status: AC
  Administered 2019-12-12: 2 g via INTRAVENOUS
  Filled 2019-12-12: qty 50

## 2019-12-12 NOTE — Progress Notes (Signed)
   12/12/19 0721  Assess: MEWS Score  Temp 98.2 F (36.8 C)  BP (!) 81/64  Pulse Rate 76  Resp (!) 21  Level of Consciousness Alert  SpO2 99 %  O2 Device Nasal Cannula  O2 Flow Rate (L/min) 2 L/min  Assess: MEWS Score  MEWS Temp 0  MEWS Systolic 1  MEWS Pulse 0  MEWS RR 1  MEWS LOC 0  MEWS Score 2  MEWS Score Color Yellow  Assess: if the MEWS score is Yellow or Red  Were vital signs taken at a resting state? Yes  Focused Assessment Change from prior assessment (see assessment flowsheet)  Early Detection of Sepsis Score *See Row Information* Low  MEWS guidelines implemented *See Row Information* Yes  Treat  MEWS Interventions Administered scheduled meds/treatments  Pain Scale 0-10  Take Vital Signs  Increase Vital Sign Frequency  Yellow: Q 2hr X 2 then Q 4hr X 2, if remains yellow, continue Q 4hrs  Escalate  MEWS: Escalate Yellow: discuss with charge nurse/RN and consider discussing with provider and RRT  Notify: Charge Nurse/RN  Name of Charge Nurse/RN Notified Janett Billow Christmas  Date Charge Nurse/RN Notified 12/12/19  Time Charge Nurse/RN Notified 0745  Notify: Provider  Provider Name/Title  Jimmye Norman)  Document  Patient Outcome Other (Comment) (patient is stable, alert oriented)

## 2019-12-12 NOTE — Progress Notes (Signed)
SUBJECTIVE: Patient continues to improve. No acute events overnight. Denies shortness of breath or chest pain.   Vitals:   12/12/19 0721 12/12/19 0927 12/12/19 0942 12/12/19 1116  BP: (!) 81/64 95/68  107/67  Pulse: 76 76  83  Resp: (!) 21 19  (!) 21  Temp: 98.2 F (36.8 C) 98.3 F (36.8 C)  98.2 F (36.8 C)  TempSrc: Axillary Oral  Oral  SpO2: 99% 100%  95%  Weight:   86.2 kg   Height:        Intake/Output Summary (Last 24 hours) at 12/12/2019 1118 Last data filed at 12/12/2019 0950 Gross per 24 hour  Intake 720 ml  Output 975 ml  Net -255 ml    LABS: Basic Metabolic Panel: Recent Labs    12/11/19 0411 12/12/19 0429  NA 137 133*  K 3.4* 3.8  CL 94* 94*  CO2 32 29  GLUCOSE 92 204*  BUN 17 30*  CREATININE 1.03 1.33*  CALCIUM 8.2* 7.6*  MG  --  1.6*   Liver Function Tests: No results for input(s): AST, ALT, ALKPHOS, BILITOT, PROT, ALBUMIN in the last 72 hours. No results for input(s): LIPASE, AMYLASE in the last 72 hours. CBC: Recent Labs    12/11/19 0411 12/12/19 0429  WBC 7.0 10.8*  HGB 13.3 12.0*  HCT 40.5 35.5*  MCV 83.9 82.4  PLT 239 245   Cardiac Enzymes: No results for input(s): CKTOTAL, CKMB, CKMBINDEX, TROPONINI in the last 72 hours. BNP: Invalid input(s): POCBNP D-Dimer: No results for input(s): DDIMER in the last 72 hours. Hemoglobin A1C: No results for input(s): HGBA1C in the last 72 hours. Fasting Lipid Panel: No results for input(s): CHOL, HDL, LDLCALC, TRIG, CHOLHDL, LDLDIRECT in the last 72 hours. Thyroid Function Tests: No results for input(s): TSH, T4TOTAL, T3FREE, THYROIDAB in the last 72 hours.  Invalid input(s): FREET3 Anemia Panel: No results for input(s): VITAMINB12, FOLATE, FERRITIN, TIBC, IRON, RETICCTPCT in the last 72 hours.   PHYSICAL EXAM General: Well developed, well nourished, in no acute distress HEENT:  Normocephalic and atramatic Neck:  No JVD.  Lungs: Scattered coarse breath sounds Heart: HRRR . Normal S1  and S2 without gallops or murmurs.  Abdomen: Bowel sounds are positive, abdomen soft and non-tender  Msk:  Back normal, normal gait. Normal strength and tone for age. Extremities: No clubbing, cyanosis or edema.   Neuro: Alert and oriented X 3. Psych:  Good affect, responds appropriately  TELEMETRY: NSR 84/bpm. Frequent PVCs  ASSESSMENT AND PLAN: Patient presenting to the emergency department with chest pain and shortness of breath after being noncompliant with medications for 1 week. Patient has converted back to NSR. Please continue amiodarone 200mg , metoprolol 100mg , and Xarelto. Patient with improving dyspnea in the setting of HFrEF exacerbation and pulmonary fibrosis. Continue supplemental oxygen and wean as tolerated. Furosemide infusion dc'd. Please continue to prioritize Entresto over spironolactone as patient remains mildly hypotensive. Plan on LifeVest on discharge d/t NICM and EF <35%. We will continue to follow  Principal Problem:   Atrial fibrillation with RVR ([place]) Active Problems:   HTN (hypertension)   Interstitial lung disease (HCC)   Chronic systolic CHF (congestive heart failure) (HCC)   Rheumatoid arthritis involving multiple sites with positive rheumatoid factor (HCC)   History of alcoholism ([place])   Hypokalemia   Pulmonary fibrosis (HCC)   Slurred speech   Chest pain   Elevated troponin   Atrial fibrillation ([place])    Jason Sill, NP-C 12/12/2019 11:18 AM

## 2019-12-12 NOTE — Progress Notes (Signed)
Date: 12/12/2019,   MRN# [id number] Jason Fitzgerald 1950-03-09 Code Status:     Code Status Orders  (From admission, onward)         Start     Ordered   12/08/19 1431  Full code  Continuous        12/08/19 1430        Code Status History    Date Active Date Inactive Code Status Order ID Comments User Context   08/05/2019 0423 08/07/2019 2106 Full Code [id number]  Christel Mormon, MD ED   12/17/2018 1453 12/20/2018 1529 Full Code [id number]  Para Skeans, MD ED   02/03/2018 1520 02/05/2018 1811 Full Code [id number]  Saundra Shelling, MD ED   03/04/2017 1231 03/06/2017 1707 Full Code [id number]  Demetrios Loll, MD ED   10/26/2014 1128 10/26/2014 1500 Full Code [id number]  Hubbard Robinson, MD Outpatient   Advance Care Planning Activity     Hosp day:@LENGTHOFSTAYDAYS @ Referring MD: @ATDPROV @     PCP:      hPI: his breathing is much better, tofay fio2 down to 2 liters. Possible home tomorrow. No wheezing, minimum coughing. Discussed pulm d/c plans with him. Will revisit anti fibrotics on his f/u visit  PMHX:   Past Medical History:  Diagnosis Date  . CHF (congestive heart failure) ([place])   . Chronic cough   . COPD (chronic obstructive pulmonary disease) ([place])   . Elevated liver function tests   . Emphysema of lung ([place])   . Fibrosis, pulmonary, interstitial, diffuse ([place])   . GERD (gastroesophageal reflux disease)   . History of cocaine abuse ([place])   . Mediastinal lymphadenopathy   . Pulmonary fibrosis ([place])   . Right inguinal hernia    Surgical Hx:  Past Surgical History:  Procedure Laterality Date  . COLONOSCOPY WITH PROPOFOL N/A 08/19/2018   Procedure: COLONOSCOPY WITH PROPOFOL;  Surgeon: Virgel Manifold, MD;  Location: [facility] ENDOSCOPY;  Service: Endoscopy;  Laterality: N/A;  . HERNIA REPAIR Right 1973   open  . INGUINAL HERNIA REPAIR Right 11/08/2014   Procedure: LAPAROSCOPIC RIGHT INGUINAL HERNIA REPAIR;  Surgeon: Hubbard Robinson, MD;  Location: [facility] ORS;  Service: General;   Laterality: Right;  . knee arthroscopy Right 1972  . RIGHT/LEFT HEART CATH AND CORONARY ANGIOGRAPHY Right 03/05/2017   Procedure: RIGHT/LEFT HEART CATH AND CORONARY ANGIOGRAPHY;  Surgeon: Dionisio David, MD;  Location: [facility];  Service: Cardiovascular;  Laterality: Right;   Family Hx:  Family History  Problem Relation Age of Onset  . Diabetes Mother   . Cancer Father   . AAA (abdominal aortic aneurysm) Brother    Social Hx:   Social History   Tobacco Use  . Smoking status: Never Smoker  . Smokeless tobacco: Never Used  Vaping Use  . Vaping Use: Never used  Substance Use Topics  . Alcohol use: Not Currently    Alcohol/week: 0.0 standard drinks    Comment: 2 quarts a week, he quit again 12/2018  . Drug use: Not Currently    Types: Cocaine    Comment: as an young adult    Medication:    Home Medication:    Current Medication: @CURMEDTAB @   Allergies:  Patient has no known allergies.  Review of Systems: Gen:  Denies  fever, sweats, chills HEENT: Denies blurred vision, double vision, ear pain, eye pain, hearing loss, nose bleeds, sore throat Cvc:  No dizziness, chest pain or heaviness Resp:less sob    Gi: Denies swallowing  difficulty, stomach pain, nausea or vomiting, diarrhea, constipation, bowel incontinence Gu:  Denies bladder incontinence, burning urine Ext:   No Joint pain, stiffness or swelling Skin: No skin rash, easy bruising or bleeding or hives Endoc:  No polyuria, polydipsia , polyphagia or weight change Psych: No depression, insomnia or hallucinations  Other:  All other systems negative  Physical Examination:   VS: BP 94/71 (BP Location: Left Arm)   Pulse 79   Temp 98 F (36.7 C) (Oral)   Resp 20   Ht 5\' 9"  (1.753 m)   Wt 86.2 kg   SpO2 96%   BMI 28.06 kg/m   General Appearance: No distress  Neuro: without focal findings, mental status, speech normal, alert and oriented, cranial nerves 2-12 intact, reflexes normal and symmetric,  sensation grossly normal  HEENT: PERRLA, EOM intact, no ptosis, no other lesions noticed, Mallampati: Pulmonary:.No wheezing, No rales  Chronic crackles  Cardiovascular:  Normal S1,S2.  No m/r/g.  Abdominal aorta pulsation normal.    Abdomen:Benign, Soft, non-tender, No masses, hepatosplenomegaly, No lymphadenopathy Endoc: No evident thyromegaly, no signs of acromegaly or Cushing features Skin:   warm, no rashes, no ecchymosis  Extremities: normal, no cyanosis, clubbing, no edema, warm with normal capillary refill. Other findings:   Labs results:   Recent Labs    12/10/19 0433 12/11/19 0411 12/12/19 0429  HGB 13.1 13.3 12.0*  HCT 38.9* 40.5 35.5*  MCV 82.9 83.9 82.4  WBC 8.1 7.0 10.8*  BUN 17 17 30*  CREATININE 0.95 1.03 1.33*  GLUCOSE 129* 92 204*  CALCIUM 8.1* 8.2* 7.6*  ,     Assessment and Plan: This is an an ex smoker, etoh abuser, presented in afib with rvr, better control now, non complaince, copd, uip. Cardiomyopathy and secondary htn. His dyspnea is multifactorial. Seem to be more congestive heart failure at this time. Did not think there is amiodarone toxicity, since he has been non compliant with his meds.  Cardiomyopathy, ? Alcohol cardiomyopathy, pulmonary edema, effusions, ef 30%, nl coronary arteries 2019. . Doubt pneumonia at this time.  Creatine up to 1.3, meds adjusted. Repeat bmp in am.    Pulmonary fibrosis, UIP), tried to start esbriet 12/20, not on it, will discuss on out pt visit. Able to get fio2 down to 2 liters - fio2 at 2 liters on d/c -continue his rheumatological meds -reconsider esbriet when stable -f/u at [facility] in 10 days  Mediastinal adenopathy probable reactive, ace level 19 -following  Copd, ex smoker -dulera, atrovent as ordered -keep sats >92 % -incentive spiro -ok to go home pulmonary wise     I have personally obtained a history, examined the patient, evaluated laboratory and imaging results, formulated the assessment and  plan and placed orders.  The Patient requires high complexity decision making for assessment and support, frequent evaluation and titration of therapies, application of advanced monitoring technologies and extensive interpretation of multiple databases.   Hagen Tidd,M.D. Pulmonary & Critical care Medicine [facility]

## 2019-12-12 NOTE — Evaluation (Signed)
Physical Therapy Evaluation Patient Details Name: Jason Fitzgerald MRN: [id number] DOB: December 05, 1950 Today's Date: 12/12/2019   History of Present Illness  Pt is 69 y/o M with PMH: COPD, pulmonary fibrosis, CHF, alcohol abuse, and Afib. Presented d/t feeling chest pain, SOB, and dizziness x2-3 days. Pt also reported some slurred speech whcih has resolved and MRI was negative. Pt was placed on CIWA d/t alc w/d. Pt found to be in Afib with RVR, likely d/t medication noncompliance per MD note.  Clinical Impression  Patient received in bed, initially declining getting oob. With some encouragement, patient agrees to get up to recliner. He requires min assist for bed mobility and sit to stand. He is able to take a few steps from bed to recliner with min assist. He is limited by pain in right knee and weakness. Patient will continue to benefit from skilled PT while here to improve strength and functional independence for return home.       Follow Up Recommendations Home health PT    Equipment Recommendations  Rolling walker with 5" wheels    Recommendations for Other Services       Precautions / Restrictions Precautions Precautions: Fall Restrictions Weight Bearing Restrictions: No      Mobility  Bed Mobility Overal bed mobility: Needs Assistance Bed Mobility: Supine to Sit     Supine to sit: Min assist     General bed mobility comments: patient requires min assist for supine to sit.    Transfers Overall transfer level: Needs assistance Equipment used: 1 person hand held assist Transfers: Sit to/from [name] Sit to Stand: Min assist Stand pivot transfers: Min assist       General transfer comment: patient requires min assist for standing and pivoting from bed to recliner  Ambulation/Gait Ambulation/Gait assistance: Min assist Gait Distance (Feet): 3 Feet Assistive device: 1 person hand held assist Gait Pattern/deviations: Step-to pattern;Wide base of  support;Decreased stride length;Decreased step length - right;Decreased step length - left Gait velocity: decreased   General Gait Details: generally weak and unsteady  Stairs            Wheelchair Mobility    Modified Rankin (Stroke Patients Only)       Balance Overall balance assessment: Needs assistance Sitting-balance support: Feet supported Sitting balance-[name]: Good Sitting balance - Comments: able to sit unsupported   Standing balance support: Bilateral upper extremity supported;During functional activity Standing balance-[name]: Fair Standing balance comment: patient requires min assist for steadying                             Pertinent Vitals/Pain Pain Assessment: Faces Faces Pain Scale: Hurts even more Pain Location: right knee with movement Pain Descriptors / Indicators: Aching;Discomfort;Grimacing;Guarding Pain Intervention(s): Monitored during session;Repositioned    Home Living Family/patient expects to be discharged to:: Private residence Living Arrangements: Alone Available Help at Discharge: Neighbor;Available PRN/intermittently Type of Home: Apartment Home Access: Level entry     Home Layout: One level Home Equipment: Shower seat;Bedside commode      Prior Function Level of Independence: Independent with assistive device(s)         Comments: reports no use of RW at baseline, ambulates in [facility] and community. Reports he drives and gets own groceries. Endorses some falls when his K+ is low. Uses shower stool occasionally.     Hand Dominance   Dominant Hand: Left    Extremity/Trunk Assessment   Upper Extremity  Assessment Upper Extremity Assessment: Generalized weakness    Lower Extremity Assessment Lower Extremity Assessment: Generalized weakness    Cervical / Trunk Assessment Cervical / Trunk Assessment: Normal  Communication   Communication: No difficulties  Cognition Arousal/Alertness:  Awake/alert Behavior During Therapy: WFL for tasks assessed/performed Overall Cognitive Status: Within Functional Limits for tasks assessed                                 General Comments: reluctant to mobilize, requires motivation      General Comments      Exercises     Assessment/Plan    PT Assessment Patient needs continued PT services  PT Problem List Decreased strength;Decreased mobility;Decreased activity tolerance;Decreased balance;Pain       PT Treatment Interventions Therapeutic activities;Gait training;Therapeutic exercise;Patient/family education;Balance training;Functional mobility training    PT Goals (Current goals can be found in the Care Plan section)  Acute Rehab PT Goals Patient Stated Goal: to go home PT Goal Formulation: With patient Time For Goal Achievement: 12/26/19 Potential to Achieve Goals: Fair    Frequency Min 2X/week   Barriers to discharge Decreased caregiver support      Co-evaluation               AM-PAC PT "6 Clicks" Mobility  Outcome Measure Help needed turning from your back to your side while in a flat bed without using bedrails?: A Little Help needed moving from lying on your back to sitting on the side of a flat bed without using bedrails?: A Little Help needed moving to and from a bed to a chair (including a wheelchair)?: A Little Help needed standing up from a chair using your arms (e.g., wheelchair or bedside chair)?: A Little Help needed to walk in hospital room?: A Lot Help needed climbing 3-5 steps with a railing? : A Lot 6 Click Score: 16    End of Session Equipment Utilized During Treatment: Gait belt Activity Tolerance: Patient limited by lethargy Patient left: in chair;with call bell/phone within reach;with chair alarm set Nurse Communication: Mobility status PT Visit Diagnosis: Unsteadiness on feet (R26.81);Other abnormalities of gait and mobility (R26.89);Muscle weakness (generalized)  (M62.81);Difficulty in walking, not elsewhere classified (R26.2);Pain Pain - Right/Left: Right Pain - part of body: Knee    Time: 1015-1040 PT Time Calculation (min) (ACUTE ONLY): 25 min   Charges:   PT Evaluation $PT Eval Moderate Complexity: 1 Mod PT Treatments $Therapeutic Activity: 8-22 mins        Austin Herd, PT, GCS 12/12/19,12:11 PM

## 2019-12-12 NOTE — Progress Notes (Signed)
   12/12/19 0927  Assess: MEWS Score  Temp 98.3 F (36.8 C)  BP 95/68  Pulse Rate 76  Resp 19  Level of Consciousness Alert  SpO2 100 %  O2 Device Nasal Cannula  O2 Flow Rate (L/min) 2 L/min  Assess: MEWS Score  MEWS Temp 0  MEWS Systolic 1  MEWS Pulse 0  MEWS RR 0  MEWS LOC 0  MEWS Score 1  MEWS Score Color Green  Assess: if the MEWS score is Yellow or Red  Were vital signs taken at a resting state? Yes  Focused Assessment No change from prior assessment  Early Detection of Sepsis Score *See Row Information* Low  MEWS guidelines implemented *See Row Information* Yes  Treat  MEWS Interventions Administered scheduled meds/treatments  Pain Scale 0-10  Pain Score 0  Take Vital Signs  Increase Vital Sign Frequency  Yellow: Q 2hr X 2 then Q 4hr X 2, if remains yellow, continue Q 4hrs  Escalate  MEWS: Escalate Yellow: discuss with charge nurse/RN and consider discussing with provider and RRT  Notify: Charge Nurse/RN  Name of Charge Nurse/RN Notified Janett Billow Christmas  Date Charge Nurse/RN Notified 12/05/19  Time Charge Nurse/RN Notified [id number]  Document  Patient Outcome Stabilized after interventions

## 2019-12-12 NOTE — Progress Notes (Signed)
PROGRESS NOTE    Jason Fitzgerald  YBO:[id number] DOB: 1950/08/11 DOA: 12/08/2019 PCP: Steele Sizer, MD   Assessment & Plan:   Principal Problem:   Atrial fibrillation with RVR ([place]) Active Problems:   HTN (hypertension)   Interstitial lung disease ([place])   Chronic systolic CHF (congestive heart failure) (HCC)   Rheumatoid arthritis involving multiple sites with positive rheumatoid factor (HCC)   History of alcoholism ([place])   Hypokalemia   Pulmonary fibrosis (HCC)   Slurred speech   Chest pain   Elevated troponin   Atrial fibrillation (HCC)   Atrial fibrillation: w/ RVR. Most likely due to medication noncompliance. Continue on home dose of xarelto, metoprolol. Keep MAP >65. Holding home dose of amiodarone secondary hypotension. Continue on tele   Acute on chronic systolic CHF: echo is worse w/ EF 25-30% LV global hypokinesis, diastolic parameters are indeterminate, mild to mod MR. Continue on spironolactone, irbesartan. IV lasix drip was d/c. Can restart lasix tomorrow if Cr has improved. Strict I/Os & daily weights. Fluid restriction. Cardio following and recs apprec   Acute hypoxic respiratory failure: likely secondary to CHF & pulmon fibrosis. Much improved. Continue on supplemental oxygen and wean as tolerated, currently on 2L [place].   Interstitial lung disease and pulmonary fibrosis: continue on bronchodilators and encourage incentive spirometry. Pulmon following and recs apprec   Chest pain: w/ elevated troponins, likely secondary to demand ischemia from rapid a. fib. Cardio recs apprec  HTN: continue on aldacone, irbesartan, metoprolol. IV hydralazine prn   History of alcoholism: not currently scoring on CIWA so this was d/c   Hypokalemia: WNL today. Will continue to monitor   Slurred speech: resolved. MRI brain neg for CVA   Rheumatoid arthritis: continue on home dose of leflunomide. Started on IV steroids     DVT prophylaxis: xarelto Code Status: full   Family Communication:  Disposition Plan: likely d/c back home. PT/OT recs SNF but pt refuses. Pt wants to think about home health as he is not sure about it    Status is: Inpatient  Remains inpatient appropriate because:Ongoing diagnostic testing needed not appropriate for outpatient work up, Unsafe d/c plan and IV treatments appropriate due to intensity of illness or inability to take PO   Dispo: The patient is from: Home              Anticipated d/c is to: Home vs home w/ home health               Anticipated d/c date is: 1 or 2 days               Patient currently is not medically stable to d/c.      Consultants:   Cardio  pulmon   Procedures:    Antimicrobials:    Subjective: Pt c/o fatigue   Objective: Vitals:   12/12/19 0500 12/12/19 0600 12/12/19 0627 12/12/19 0721  BP: (!) 89/65 (!) 88/64  (!) 81/64  Pulse: 62 75 70 76  Resp:  20 20 (!) 21  Temp:    98.2 F (36.8 C)  TempSrc:    Axillary  SpO2: 98% 99%  99%  Weight:      Height:        Intake/Output Summary (Last 24 hours) at 12/12/2019 0802 Last data filed at 12/12/2019 2778 Gross per 24 hour  Intake 720 ml  Output 1075 ml  Net -355 ml   Filed Weights   12/09/19 0607 12/10/19 0356 12/11/19 0533  Weight:  87.5 kg 85.8 kg 85.1 kg    Examination:  General exam: Appears calm & comfortable  Respiratory system: decreased breath sounds b/l Cardiovascular system: irregularly irregular. No rubs or gallops Gastrointestinal system: Abd is soft, non-distended, non-tender, hypoactive bowel sounds  Central nervous system: alert and oriented. Moves all 4 extremities  Psychiatry: Judgement and insight appear normal. Flat mood and affect.     Data Reviewed: I have personally reviewed following labs and imaging studies  CBC: Recent Labs  Lab 12/08/19 0700 12/09/19 0414 12/10/19 0433 12/11/19 0411 12/12/19 0429  WBC 5.5 7.0 8.1 7.0 10.8*  HGB 13.1 12.0* 13.1 13.3 12.0*  HCT 39.9 36.9* 38.9* 40.5  35.5*  MCV 83.0 83.3 82.9 83.9 82.4  PLT 284 256 238 239 151   Basic Metabolic Panel: Recent Labs  Lab 12/08/19 0700 12/09/19 0414 12/10/19 0433 12/11/19 0411 12/12/19 0429  NA 132* 135 136 137 133*  K 2.7* 3.5 3.2* 3.4* 3.8  CL 92* 100 99 94* 94*  CO2 28 25 27  32 29  GLUCOSE 109* 160* 129* 92 204*  BUN 9 16 17 17  30*  CREATININE 0.79 1.00 0.95 1.03 1.33*  CALCIUM 8.4* 7.8* 8.1* 8.2* 7.6*  MG 1.9  --   --   --  1.6*   GFR: Estimated Creatinine Clearance: 58.3 mL/min (A) (by C-G formula based on SCr of 1.33 mg/dL (H)). Liver Function Tests: No results for input(s): AST, ALT, ALKPHOS, BILITOT, PROT, ALBUMIN in the last 168 hours. No results for input(s): LIPASE, AMYLASE in the last 168 hours. No results for input(s): AMMONIA in the last 168 hours. Coagulation Profile: No results for input(s): INR, PROTIME in the last 168 hours. Cardiac Enzymes: No results for input(s): CKTOTAL, CKMB, CKMBINDEX, TROPONINI in the last 168 hours. BNP (last 3 results) No results for input(s): PROBNP in the last 8760 hours. HbA1C: No results for input(s): HGBA1C in the last 72 hours. CBG: No results for input(s): GLUCAP in the last 168 hours. Lipid Profile: No results for input(s): CHOL, HDL, LDLCALC, TRIG, CHOLHDL, LDLDIRECT in the last 72 hours. Thyroid Function Tests: No results for input(s): TSH, T4TOTAL, FREET4, T3FREE, THYROIDAB in the last 72 hours. Anemia Panel: No results for input(s): VITAMINB12, FOLATE, FERRITIN, TIBC, IRON, RETICCTPCT in the last 72 hours. Sepsis Labs: Recent Labs  Lab 12/10/19 0433  PROCALCITON <0.10    Recent Results (from the past 240 hour(s))  Respiratory Panel by RT PCR (Flu A&B, Covid) - Nasopharyngeal Swab     Status: None   Collection Time: 12/08/19  7:47 AM   Specimen: Nasopharyngeal Swab  Result Value Ref Range Status   SARS Coronavirus 2 by RT PCR NEGATIVE NEGATIVE Final    Comment: (NOTE) SARS-CoV-2 target nucleic acids are NOT  DETECTED.  The SARS-CoV-2 RNA is generally detectable in upper respiratoy specimens during the acute phase of infection. The lowest concentration of SARS-CoV-2 viral copies this assay can detect is 131 copies/mL. A negative result does not preclude SARS-Cov-2 infection and should not be used as the sole basis for treatment or other patient management decisions. A negative result may occur with  improper specimen collection/handling, submission of specimen other than nasopharyngeal swab, presence of viral mutation(s) within the areas targeted by this assay, and inadequate number of viral copies (<131 copies/mL). A negative result must be combined with clinical observations, patient history, and epidemiological information. The expected result is Negative.  Fact Sheet for Patients:  PinkCheek.be  Fact Sheet for Healthcare Providers:  GravelBags.it  This test is no t yet approved or cleared by the [place] and  has been authorized for detection and/or diagnosis of SARS-CoV-2 by FDA under an Emergency Use Authorization (EUA). This EUA will remain  in effect (meaning this test can be used) for the duration of the COVID-19 declaration under Section 564(b)(1) of the Act, 21 U.S.C. section 360bbb-3(b)(1), unless the authorization is terminated or revoked sooner.     Influenza A by PCR NEGATIVE NEGATIVE Final   Influenza B by PCR NEGATIVE NEGATIVE Final    Comment: (NOTE) The Xpert Xpress SARS-CoV-2/FLU/RSV assay is intended as an aid in  the diagnosis of influenza from Nasopharyngeal swab specimens and  should not be used as a sole basis for treatment. Nasal washings and  aspirates are unacceptable for Xpert Xpress SARS-CoV-2/FLU/RSV  testing.  Fact Sheet for Patients: PinkCheek.be  Fact Sheet for Healthcare Providers: GravelBags.it  This test is not yet  approved or cleared by the [place] FDA and  has been authorized for detection and/or diagnosis of SARS-CoV-2 by  FDA under an Emergency Use Authorization (EUA). This EUA will remain  in effect (meaning this test can be used) for the duration of the  Covid-19 declaration under Section 564(b)(1) of the Act, 21  U.S.C. section 360bbb-3(b)(1), unless the authorization is  terminated or revoked. Performed at [facility], [address] [place], [place] [address]          Radiology Studies: No results found.      Scheduled Meds:  aspirin EC  325 mg Oral Daily   atorvastatin  10 mg Oral Daily   docusate sodium  200 mg Oral BID   folic acid  1 mg Oral Daily   ipratropium  2 puff Inhalation Q4H   lactulose  30 g Oral TID   leflunomide  10 mg Oral Daily   methylPREDNISolone (SOLU-MEDROL) injection  40 mg Intravenous Q8H   metoprolol succinate  100 mg Oral Daily   mometasone-formoterol  2 puff Inhalation BID   multivitamin with minerals  1 tablet Oral Daily   rivaroxaban  20 mg Oral Q supper   sacubitril-valsartan  1 tablet Oral BID   spironolactone  25 mg Oral Daily   thiamine  100 mg Oral Daily   Continuous Infusions:  sodium chloride 500 mL (12/09/19 2348)   albumin human 25 g (12/12/19 0246)     LOS: 3 days    Time spent: 30 mins     Wyvonnia Dusky, MD [facility] Pager [phone]  If 7PM-7AM, please contact night-coverage [email or web address] 12/12/2019, 8:02 AM

## 2019-12-12 NOTE — TOC Initial Note (Addendum)
Transition of Care [facility]) - Initial/Assessment Note    Patient Details  Name: Jason Fitzgerald MRN: [id number] Date of Birth: 1950/12/21  Transition of [facility]) CM/SW Contact:    Eileen Stanford, LCSW Phone Number: 12/12/2019, 3:24 PM  Clinical Narrative:   Patient is alert and oriented. CSW spoke with pt at bedside. CSW explained PT'S HH recommendation. Pt states "what are they actually going to do." CSW explained the concept of [place] and the different providers available. Pt states " I will have to talk to my sister, I don't make any decision without talking to her." CSW explained the difference in [facility] and outpatient PT. Pt verbalized understanding. Pt ask that CSW check back in with him tomorrow.   Pt states he takes himself to his doctor appointments. Pt states he has a established PCP listed in chart. Pt states he is not getting any in home services prior to admission. Pt states [facility] [place].               Expected Discharge Plan: [place] Barriers to Discharge: Continued Medical Work up   Patient Goals and CMS Choice Patient states their goals for this hospitalization and ongoing recovery are:: to get stronger      Expected Discharge Plan and Services Expected Discharge Plan: [place] In-house Referral: Clinical Social Work   Post Acute Care Choice: [place] arrangements for the past 2 months: [place]                                      Prior Living Arrangements/Services Living arrangements for the past 2 months: Single Family Home Lives with:: Self Patient language and need for interpreter reviewed:: Yes Do you feel safe going back to the place where you live?: Yes      Need for Family Participation in Patient Care: Yes (Comment) Care giver support system in place?: Yes (comment)   Criminal Activity/Legal Involvement Pertinent to Current Situation/Hospitalization: No -  Comment as needed  Activities of Daily Living Home Assistive Devices/Equipment: None ADL Screening (condition at time of admission) Patient's cognitive ability adequate to safely complete daily activities?: Yes Is the patient deaf or have difficulty hearing?: No Does the patient have difficulty seeing, even when wearing glasses/contacts?: No Does the patient have difficulty concentrating, remembering, or making decisions?: No Patient able to express need for assistance with ADLs?: Yes Does the patient have difficulty dressing or bathing?: No Independently performs ADLs?: Yes (appropriate for developmental age) Does the patient have difficulty walking or climbing stairs?: Yes Weakness of Legs: None Weakness of Arms/Hands: None  Permission Sought/Granted Permission sought to share information with : Family Supports Permission granted to share information with : No  Share Information with NAME: Heidi Dach (Sister) [phone]           Emotional Assessment Appearance:: Appears stated age Attitude/Demeanor/Rapport: Guarded, Hostile Affect (typically observed): Agitated Orientation: : Oriented to Self, Oriented to Place, Oriented to  Time, Oriented to Situation Alcohol / Substance Use: Not Applicable Psych Involvement: No (comment)  Admission diagnosis:  Palpitations [R00.2] Atrial fibrillation with RVR ([place]) [I48.91] Atrial fibrillation ([place]) [I48.91] Patient Active Problem List   Diagnosis Date Noted  . Atrial fibrillation ([place]) 12/09/2019  . Atrial fibrillation with RVR ([place]) 12/08/2019  . Slurred speech 12/08/2019  . Chest pain  12/08/2019  . Elevated troponin 12/08/2019  . Acute CHF (congestive heart failure) ([place]) 08/05/2019  . Diarrhea   . Dizziness 12/17/2018  . Hypotension 12/17/2018  . Hypophosphatemia 12/17/2018  . Hypomagnesemia 12/17/2018  . Positive D-dimer 12/17/2018  . Abnormal LFTs (liver function tests) 12/17/2018  . Anemia 12/17/2018  .  Pulmonary fibrosis ([place])   . GERD (gastroesophageal reflux disease)   . COPD (chronic obstructive pulmonary disease) ([place])   . CHF (congestive heart failure) ([place])   . Encounter for screening colonoscopy   . Polyp of transverse colon   . Internal hemorrhoids   . Hypokalemia 02/03/2018  . History of alcoholism ([place]) 12/08/2017  . Rheumatoid arthritis involving multiple sites with positive rheumatoid factor ([place]) 10/13/2017  . Chronic systolic CHF (congestive heart failure) ([place]) 03/12/2017  . AF (paroxysmal atrial fibrillation) ([place]) 03/12/2017  . Arthritis of left knee 08/01/2016  . 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 Chronic cough 05/29/2015  . CAFL (chronic airflow limitation) ([place]) 10/24/2014  . Arteriosclerosis of coronary artery 10/24/2014  . History of fundoplication 18/29/9371  . HTN (hypertension) 10/24/2014  . LAD (lymphadenopathy), mediastinal 07/13/2013  . Interstitial lung disease ([place]) 06/19/2013  . 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   PCP:  Steele Sizer, MD Pharmacy:   [id number] [place] [place] [address] [place] [address] Phone: [phone] Fax: [phone]     Social Determinants of Health (SDOH) Interventions    Readmission Risk Interventions Readmission Risk Prevention Plan 12/12/2019 12/10/2019  Transportation Screening Complete Complete  PCP or Specialist Appt within 3-5 Days Complete Complete  HRI or Home Complete Complete  Social Work Consult for [place] Planning/Counseling Complete Complete  Palliative Care Screening Complete Not Applicable  Medication Review Press photographer) Complete Complete  Some recent data might be hidden

## 2019-12-13 DIAGNOSIS — I4891 Unspecified atrial fibrillation: Secondary | ICD-10-CM | POA: Diagnosis not present

## 2019-12-13 LAB — CBC
HCT: 36.5 % — ABNORMAL LOW (ref 39.0–52.0)
Hemoglobin: 12.2 g/dL — ABNORMAL LOW (ref 13.0–17.0)
MCH: 27.3 pg (ref 26.0–34.0)
MCHC: 33.4 g/dL (ref 30.0–36.0)
MCV: 81.7 fL (ref 80.0–100.0)
Platelets: 266 10*3/uL (ref 150–400)
RBC: 4.47 MIL/uL (ref 4.22–5.81)
RDW: 15 % (ref 11.5–15.5)
WBC: 11.3 10*3/uL — ABNORMAL HIGH (ref 4.0–10.5)
nRBC: 0 % (ref 0.0–0.2)

## 2019-12-13 LAB — BASIC METABOLIC PANEL
Anion gap: 9 (ref 5–15)
BUN: 34 mg/dL — ABNORMAL HIGH (ref 8–23)
CO2: 29 mmol/L (ref 22–32)
Calcium: 8 mg/dL — ABNORMAL LOW (ref 8.9–10.3)
Chloride: 95 mmol/L — ABNORMAL LOW (ref 98–111)
Creatinine, Ser: 0.73 mg/dL (ref 0.61–1.24)
GFR, Estimated: >60 mL/min (ref >60)
Glucose, Bld: 218 mg/dL — ABNORMAL HIGH (ref 70–99)
Potassium: 3.2 mmol/L — ABNORMAL LOW (ref 3.5–5.1)
Sodium: 133 mmol/L — ABNORMAL LOW (ref 135–145)

## 2019-12-13 LAB — MAGNESIUM: Magnesium: 2.4 mg/dL (ref 1.7–2.4)

## 2019-12-13 MED ORDER — SACUBITRIL-VALSARTAN 24-26 MG PO TABS: 1.0000 | ORAL_TABLET | Freq: Two times a day (BID) | ORAL | 1 refills | Status: DC

## 2019-12-13 MED ORDER — METOPROLOL SUCCINATE ER 100 MG PO TB24
100.0000 mg | ORAL_TABLET | Freq: Every day | ORAL | 1 refills | Status: DC
Start: 2019-12-14 — End: 2020-04-18

## 2019-12-13 MED ORDER — ASPIRIN EC 81 MG PO TBEC
81.0000 mg | DELAYED_RELEASE_TABLET | Freq: Every day | ORAL | 11 refills | Status: DC
Start: 2019-12-13 — End: 2020-04-18

## 2019-12-13 MED ORDER — POTASSIUM CHLORIDE CRYS ER 20 MEQ PO TBCR
40.0000 meq | EXTENDED_RELEASE_TABLET | Freq: Once | ORAL | Status: AC
  Administered 2019-12-13: 40 meq via ORAL
  Filled 2019-12-13: qty 2

## 2019-12-13 MED ORDER — AMIODARONE HCL 200 MG PO TABS
200.0000 mg | ORAL_TABLET | Freq: Every day | ORAL | Status: DC
  Administered 2019-12-13: 200 mg via ORAL
  Filled 2019-12-13: qty 1

## 2019-12-13 NOTE — Discharge Summary (Signed)
Jason Fitzgerald QMV:[id number] DOB: [birth year] DOA: 12/08/2019  PCP: Steele Sizer, MD  Admit date: 12/08/2019 Discharge date: 12/13/2019  Time spent: 35 minutes  Recommendations for Outpatient Follow-up:  1. Close f/u with cardiology ([facility] to arrange) 2. Will need check of kidney function and electrolytes within 1-2 wks of discharge 3. Outpatient f/u with pulmonologist Dr. Raul Del (patient to arrange)    Discharge Diagnoses:  Principal Problem:   Atrial fibrillation with RVR ([place]) Active Problems:   HTN (hypertension)   Interstitial lung disease ([place])   Chronic systolic CHF (congestive heart failure) (HCC)   Rheumatoid arthritis involving multiple sites with positive rheumatoid factor ([place])   History of alcoholism ([place])   Hypokalemia   Pulmonary fibrosis (HCC)   Slurred speech   Chest pain   Elevated troponin   Atrial fibrillation [facility])   Discharge Condition: fair  Diet recommendation: low sodium heart healthy  Filed Weights   12/11/19 0533 12/12/19 0942 12/13/19 0500  Weight: 85.1 kg 86.2 kg 84.3 kg    History of present illness:   Jason Fitzgerald is a 69 y.o. male with medical history significant of hypertension, hyperlipidemia, COPD, GERD, interstitial lung disease, pulmonary fibrosis, cocaine abuse, sCHF with EF of 45 to 50%, atrial fibrillation on Xarelto (not compliant), rheumatoid arthritis, alcohol abuse, who presents with palpitation, chest pain and slurred speech.  Patient states that he has not taking all of his home medications for about 5 days.  He developed palpitation and chest pain in the past several days, which has been progressively worsening.  His chest pain is located substernal area, pressure-like, moderate, dull, nonradiating.  Associated with shortness breath.  Patient has dry cough, no fever or chills.  No nausea, vomiting, diarrhea, abdominal pain, symptoms of UTI.  Patient states that he has dizziness and slurred speech in the  past several days.  No unilateral numbness or tingling to extremities.  No hearing loss or vision loss.  No facial droop.  Patient was found to have A. fib with RVR, heart rate is up to 150s in ED, Cardizem drip started.  ED Course: pt was found to have WBC 5.5, negative Covid PCR, BMP 630, potassium 2.7, trop 39, renal function okay, blood pressure 123/86, RR 32, 27, 18, oxygen saturation 98% on room air.  Chest x-ray showed interstitial lung disease.  CT head is negative for acute intracranial abnormalities.  Patient is placed on progressive plan for observation.  Hospital Course:   Atrial fibrillation: w/ RVR. Most likely due to medication noncompliance.  - d/c with home metoprolol, amiodarone, xarelto  Acute on chronic systolic CHF: echo is worse w/ EF 25-30% LV global hypokinesis, diastolic parameters are indeterminate, mild to mod MR. Continued metoprolol, started entresto, held spironolactone. Will need close cardiology f/u, also has referral to heart failure clinic.  Acute hypoxic respiratory failure: likely secondary to CHF & pulmon fibrosis. Resolved.  Interstitial lung diseaseand pulmonary fibrosis: continue on bronchodilators and encourage incentive spirometry. Will need outpt f/u with pulmonology dr. Raul Del  Chest pain: w/ elevated troponins, likely secondary to demand ischemia from rapid a. Fib.  HTN: see above meds. bp appropriate.  History of alcoholism: not currently scoring on CIWA so this was d/c   Hypokalemia: will need outpt monitoring  Slurred speech: resolved. MRI brain neg for CVA   Rheumatoid arthritis: continue on home dose of leflunomide.   Procedures:  none   Consultations:  Cardiology, pulmonology  Discharge Exam: Vitals:   12/13/19 1140 12/13/19 1230  BP: 101/77 99/77  Pulse: 71 71  Resp: (!) 21 18  Temp: 97.8 F (36.6 C) 97.7 F (36.5 C)  SpO2: 98% 100%    General exam: Appears calm & comfortable  Respiratory system:  decreased breath sounds b/l Cardiovascular system: irregularly irregular. No rubs or gallops Gastrointestinal system: Abd is soft, non-distended, non-tender, hypoactive bowel sounds  Central nervous system: alert and oriented. Moves all 4 extremities  Psychiatry: Judgement and insight appear normal. Flat mood and affect.  Discharge Instructions   Discharge Instructions    Amb referral to [facility]IB Clinic   Complete by: As directed    Call MD for:  difficulty breathing, headache or visual disturbances   Complete by: As directed    Call MD for:  extreme fatigue   Complete by: As directed    Call MD for:  persistant dizziness or light-headedness   Complete by: As directed    Call MD for:  persistant nausea and vomiting   Complete by: As directed    Call MD for:  redness, tenderness, or signs of infection (pain, swelling, redness, odor or green/yellow discharge around incision site)   Complete by: As directed    Call MD for:  severe uncontrolled pain   Complete by: As directed    Call MD for:  temperature >100.4   Complete by: As directed    Diet - low sodium heart healthy   Complete by: As directed    Increase activity slowly   Complete by: As directed      Allergies as of 12/13/2019   No Known Allergies     Medication List    STOP taking these medications   spironolactone 25 MG tablet Commonly known as: ALDACTONE   valsartan 80 MG tablet Commonly known as: DIOVAN     TAKE these medications   amiodarone 200 MG tablet Commonly known as: PACERONE Take 200 mg by mouth daily.   aspirin EC 81 MG tablet Take 1 tablet (81 mg total) by mouth daily. Swallow whole. What changed: how much to take   atorvastatin 10 MG tablet Commonly known as: LIPITOR Take 1 tablet (10 mg total) by mouth daily.   budesonide-formoterol 160-4.5 MCG/ACT inhaler Commonly known as: SYMBICORT Inhale 1-2 puffs into the lungs daily as needed (respiratory difficulty).   furosemide 40 MG  tablet Commonly known as: LASIX Take 1 tablet (40 mg total) by mouth daily.   leflunomide 10 MG tablet Commonly known as: ARAVA Take 10 mg by mouth daily.   metoprolol succinate 100 MG 24 hr tablet Commonly known as: TOPROL-XL Take 1 tablet (100 mg total) by mouth daily. Take with or immediately following a meal. Start taking on: December 14, 2019 What changed:   medication strength  how much to take   potassium chloride SA 20 MEQ tablet Commonly known as: KLOR-CON Take 1 tablet (20 mEq total) by mouth daily.   predniSONE 5 MG tablet Commonly known as: DELTASONE Take 2.5 mg by mouth daily.   rivaroxaban 20 MG Tabs tablet Commonly known as: XARELTO Take 1 tablet (20 mg total) by mouth daily with supper.   sacubitril-valsartan 24-26 MG Commonly known as: ENTRESTO Take 1 tablet by mouth 2 (two) times daily.   thiamine 100 MG tablet Take 1 tablet (100 mg total) by mouth daily. Every other day      No Known Allergies  Follow-up Information    [facility] Follow up on 12/18/2019.   Specialty: Cardiology Why:  at 2:00pm. Enter through the [facility] entrance Contact information: [place] [address] [place] [phone]       Dionisio David, MD. Go on 12/18/2019.   Specialty: Cardiology Why: 10AM Contact information: [address] [place] [address] [phone]        Erby Pian, MD. Schedule an appointment as soon as possible for a visit.   Specialty: Specialist Contact information: [place] [address] [phone]                The results of significant diagnostics from this hospitalization (including imaging, microbiology, ancillary and laboratory) are listed below for reference.    Significant Diagnostic Studies: DG Chest 1 View  Result Date: 12/08/2019 CLINICAL DATA:  Chest pain. Shortness of breath. Interstitial fibrosis.  EXAM: CHEST  1 VIEW COMPARISON:  08/05/2019.  CT 12/17/2018, 03/30/2014. FINDINGS: Heart size normal. Diffuse severe bilateral pulmonary interstitial prominence again noted none consistent chronic interstitial lung disease. Interstitial prominence is increased from prior exam. Superimposed active process including pneumonitis cannot be excluded. No significant interim change. Stable mild basilar pleural thickening. No pneumothorax. IMPRESSION: Diffuse severe bilateral pulmonary interstitial prominence again noted consistent with known chronic interstitial lung disease. Interstitial prominence has increased from prior exam. Superimposed active process including pneumonitis cannot be excluded. Electronically Signed   By: Marcello Moores  Register   On: 12/08/2019 07:39   CT Head Wo Contrast  Result Date: 12/08/2019 CLINICAL DATA:  Slurred speech. Chest pain and shortness of breath with dizziness. EXAM: CT HEAD WITHOUT CONTRAST TECHNIQUE: Contiguous axial images were obtained from the base of the skull through the vertex without intravenous contrast. COMPARISON:  None. FINDINGS: Brain: No evidence of acute infarction, hemorrhage, hydrocephalus, extra-axial collection or mass lesion/mass effect. Vascular: No hyperdense vessel or unexpected calcification. Skull: Normal. Negative for fracture or focal lesion. Sinuses/Orbits: No acute finding. IMPRESSION: No acute finding or explanation for symptoms. Atherosclerosis. Electronically Signed   By: Monte Fantasia M.D.   On: 12/08/2019 07:41   CT ANGIO CHEST PE W OR WO CONTRAST  Result Date: 12/09/2019 CLINICAL DATA:  Inpatient. Rheumatoid arthritis. COPD. Pulmonary fibrosis. Chest pain and dyspnea. Nonproductive cough. Arrhythmia. EXAM: CT ANGIOGRAPHY CHEST WITH CONTRAST TECHNIQUE: Multidetector CT imaging of the chest was performed using the standard protocol during bolus administration of intravenous contrast. Multiplanar CT image reconstructions and MIPs were obtained to  evaluate the vascular anatomy. CONTRAST:  50mL OMNIPAQUE IOHEXOL 350 MG/ML SOLN COMPARISON:  08/05/2018 chest radiograph. 12/17/2018 chest CT angiogram. FINDINGS: Cardiovascular: The study is moderate quality for the evaluation of pulmonary embolism, with motion degradation. There are no convincing filling defects in the central, lobar, segmental or subsegmental pulmonary artery branches to suggest acute pulmonary embolism. Atherosclerotic nonaneurysmal thoracic aorta. Normal caliber pulmonary arteries. Normal heart size. No significant pericardial fluid/thickening. Left anterior descending coronary atherosclerosis. Mediastinum/Nodes: No discrete thyroid nodules. Unremarkable esophagus. No axillary adenopathy. New mild bilateral mediastinal and bilateral hilar lymphadenopathy. Representative 1.2 cm AP window node (series 4/image 47), 1.2 cm right subcarinal node (series 4/image 65), 1.5 cm right hilar node (series 4/image 57) and 1.2 cm left hilar node (series 4/image 57). Lungs/Pleura: No pneumothorax. Small dependent bilateral pleural effusions, right greater than left. No lung masses or significant pulmonary nodules. Patchy consolidation and ground-glass opacity throughout the bilateral upper lobes is new, most prominent in the posterior left upper lobe. Background of severe patchy confluent subpleural reticulation and severe honeycombing with associated volume loss, parenchymal distortion and  mild traction bronchiectasis with a basilar predominance, not appreciably changed. Upper abdomen: Contrast reflux into the IVC and hepatic veins. Simple 2.4 cm peripheral right liver cyst. Musculoskeletal: No aggressive appearing focal osseous lesions. Marked bilateral gynecomastia, unchanged. Review of the MIP images confirms the above findings. IMPRESSION: 1. Motion degraded scan. No evidence of pulmonary embolism. 2. New patchy consolidation and ground-glass opacity throughout the bilateral upper lobes, most prominent in  the posterior left upper lobe. Given the small dependent bilateral pleural effusions and the contrast reflux into the IVC and hepatic veins with reported history of CHF, differential for these upper lobe pulmonary opacities includes acute cardiogenic pulmonary edema versus multilobar pneumonia. 3. New mild bilateral mediastinal and bilateral hilar lymphadenopathy, nonspecific, potentially reactive. Suggest attention on follow-up chest CT with IV contrast in 3-6 months. 4. Background of severe basilar predominant fibrotic interstitial lung disease with marked honeycombing, not appreciably changed. Findings are consistent with UIP per consensus guidelines: Diagnosis of Idiopathic Pulmonary Fibrosis: An Official ATS/ERS/JRS/ALAT Clinical Practice Guideline. [place], Iss 5, [phone], Oct 03 2016. 5. One vessel coronary atherosclerosis. 6. Aortic Atherosclerosis (ICD10-I70.0). Electronically Signed   By: Ilona Sorrel M.D.   On: 12/09/2019 09:02   MR BRAIN WO CONTRAST  Result Date: 12/08/2019 CLINICAL DATA:  Neuro deficit, acute stroke suspected. EXAM: MRI HEAD WITHOUT CONTRAST TECHNIQUE: Multiplanar, multiecho pulse sequences of the brain and surrounding structures were obtained without intravenous contrast. COMPARISON:  CT head from the same day. FINDINGS: Brain: No acute infarction, hemorrhage, hydrocephalus, extra-axial collection or mass lesion. Mild T2/FLAIR hyperintensities within the white matter, compatible with chronic microvascular ischemic disease. Vascular: Major arterial flow voids are maintained at the skull base. Skull and upper cervical spine: Normal marrow signal. Sinuses/Orbits: The sinuses are clear.  Unremarkable orbits. Other: Small right mastoid effusion. IMPRESSION: No acute intracranial abnormality.  Specifically, no acute infarct. Electronically Signed   By: Margaretha Sheffield MD   On: 12/08/2019 11:13   ECHOCARDIOGRAM COMPLETE  Result Date: 12/09/2019     ECHOCARDIOGRAM REPORT   Patient Name:   APOLLO TIMOTHY Date of Exam: 12/09/2019 Medical Rec #:  [id number]       Height:       69.0 in Accession #:    [id number]      Weight:       192.9 lb Date of Birth:  08-31-1950      BSA:          2.035 m Patient Age:    18 years        BP:           98/59 mmHg Patient Gender: M               HR:           84 bpm. Exam Location:  [facility] Procedure: 2D Echo Indications:     Chest Pain 786.50 / R07.9  History:         Patient has prior history of Echocardiogram examinations, most                  recent 09/01/2019. CHF, COPD; Risk Factors:Hypertension.  Sonographer:     Avanell Shackleton Referring Phys:  Unknown Foley NIU Diagnosing Phys: Kate Sable MD IMPRESSIONS  1. Left ventricular ejection fraction, by estimation, is 25 to 30%. Left ventricular ejection fraction by 2D MOD biplane is 26.1 %. The left ventricle has severely decreased function. The left ventricle demonstrates global hypokinesis. Left  ventricular diastolic parameters are indeterminate.  2. Right ventricular systolic function is moderately reduced. The right ventricular size is normal. There is moderately elevated pulmonary artery systolic pressure.  3. Left atrial size was mildly dilated.  4. The mitral valve is grossly normal. Mild to moderate mitral valve regurgitation.  5. The aortic valve is tricuspid. Aortic valve regurgitation is mild.  6. The inferior vena cava is dilated in size with <50% respiratory variability, suggesting right atrial pressure of 15 mmHg. FINDINGS  Left Ventricle: Left ventricular ejection fraction, by estimation, is 25 to 30%. Left ventricular ejection fraction by 2D MOD biplane is 26.1 %. The left ventricle has severely decreased function. The left ventricle demonstrates global hypokinesis. The left ventricular internal cavity size was normal in size. There is no left ventricular hypertrophy. Left ventricular diastolic parameters are indeterminate. Right Ventricle: The right ventricular  size is normal. No increase in right ventricular wall thickness. Right ventricular systolic function is moderately reduced. There is moderately elevated pulmonary artery systolic pressure. The tricuspid regurgitant velocity is 3.35 m/s, and with an assumed right atrial pressure of 15 mmHg, the estimated right ventricular systolic pressure is 49.2 mmHg. Left Atrium: Left atrial size was mildly dilated. Right Atrium: Right atrial size was normal in size. Pericardium: There is no evidence of pericardial effusion. Mitral Valve: The mitral valve is grossly normal. Mild to moderate mitral valve regurgitation. Tricuspid Valve: The tricuspid valve is normal in structure. Tricuspid valve regurgitation is not demonstrated. Aortic Valve: The aortic valve is tricuspid. Aortic valve regurgitation is mild. Pulmonic Valve: The pulmonic valve was not well visualized. Pulmonic valve regurgitation is not visualized. Aorta: The aortic root is normal in size and structure. Venous: The inferior vena cava is dilated in size with less than 50% respiratory variability, suggesting right atrial pressure of 15 mmHg. IAS/Shunts: No atrial level shunt detected by color flow Doppler.  LEFT VENTRICLE PLAX 2D                        Biplane EF (MOD) LVIDd:         5.33 cm         LV Biplane EF:   Left LVIDs:         4.79 cm                          ventricular LV PW:         0.86 cm                          ejection LV IVS:        0.82 cm                          fraction by LVOT diam:     2.00 cm                          2D MOD LVOT Area:     3.14 cm                         biplane is  26.1 %.  LV Volumes (MOD) LV vol d, MOD    230.0 ml A2C: LV vol d, MOD    146.0 ml A4C: LV vol s, MOD    164.0 ml A2C: LV vol s, MOD    107.0 ml A4C: LV SV MOD A2C:   66.0 ml LV SV MOD A4C:   146.0 ml LV SV MOD BP:    48.0 ml RIGHT VENTRICLE            IVC RV S prime:     7.10 cm/s  IVC diam: 2.36 cm TAPSE (M-mode):  1.2 cm LEFT ATRIUM             Index       RIGHT ATRIUM           Index LA diam:        4.80 cm 2.36 cm/m  RA Area:     15.50 cm LA Vol (A2C):   93.9 ml 46.15 ml/m RA Volume:   39.00 ml  19.17 ml/m LA Vol (A4C):   46.1 ml 22.66 ml/m LA Biplane Vol: 68.2 ml 33.52 ml/m   AORTA Ao Root diam: 3.00 cm MR Peak grad: 61.1 mmHg   TRICUSPID VALVE MR Mean grad: 38.5 mmHg   TR Peak grad:   44.9 mmHg MR Vmax:      390.75 cm/s TR Vmax:        335.00 cm/s MR Vmean:     291.2 cm/s                           SHUNTS                           Systemic Diam: 2.00 cm Kate Sable MD Electronically signed by Kate Sable MD Signature Date/Time: 12/09/2019/2:08:38 PM    Final     Microbiology: Recent Results (from the past 240 hour(s))  Respiratory Panel by RT PCR (Flu A&B, Covid) - Nasopharyngeal Swab     Status: None   Collection Time: 12/08/19  7:47 AM   Specimen: Nasopharyngeal Swab  Result Value Ref Range Status   SARS Coronavirus 2 by RT PCR NEGATIVE NEGATIVE Final    Comment: (NOTE) SARS-CoV-2 target nucleic acids are NOT DETECTED.  The SARS-CoV-2 RNA is generally detectable in upper respiratoy specimens during the acute phase of infection. The lowest concentration of SARS-CoV-2 viral copies this assay can detect is 131 copies/mL. A negative result does not preclude SARS-Cov-2 infection and should not be used as the sole basis for treatment or other patient management decisions. A negative result may occur with  improper specimen collection/handling, submission of specimen other than nasopharyngeal swab, presence of viral mutation(s) within the areas targeted by this assay, and inadequate number of viral copies (<131 copies/mL). A negative result must be combined with clinical observations, patient history, and epidemiological information. The expected result is Negative.  Fact Sheet for Patients:  PinkCheek.be  Fact Sheet for Healthcare Providers:   GravelBags.it  This test is no t yet approved or cleared by the [place] FDA and  has been authorized for detection and/or diagnosis of SARS-CoV-2 by FDA under an Emergency Use Authorization (EUA). This EUA will remain  in effect (meaning this test can be used) for the duration of the COVID-19 declaration under Section 564(b)(1) of the Act, 21 U.S.C. section 360bbb-3(b)(1), unless the authorization is terminated or revoked sooner.  Influenza A by PCR NEGATIVE NEGATIVE Final   Influenza B by PCR NEGATIVE NEGATIVE Final    Comment: (NOTE) The Xpert Xpress SARS-CoV-2/FLU/RSV assay is intended as an aid in  the diagnosis of influenza from Nasopharyngeal swab specimens and  should not be used as a sole basis for treatment. Nasal washings and  aspirates are unacceptable for Xpert Xpress SARS-CoV-2/FLU/RSV  testing.  Fact Sheet for Patients: PinkCheek.be  Fact Sheet for Healthcare Providers: GravelBags.it  This test is not yet approved or cleared by the [place] FDA and  has been authorized for detection and/or diagnosis of SARS-CoV-2 by  FDA under an Emergency Use Authorization (EUA). This EUA will remain  in effect (meaning this test can be used) for the duration of the  Covid-19 declaration under Section 564(b)(1) of the Act, 21  U.S.C. section 360bbb-3(b)(1), unless the authorization is  terminated or revoked. Performed at [facility], [address] [place], [place] [address]      Labs: Basic Metabolic Panel: Recent Labs  Lab 12/08/19 0700 12/08/19 0700 12/09/19 0414 12/10/19 0433 12/11/19 0411 12/12/19 0429 12/13/19 0550  NA 132*   < > 135 136 137 133* 133*  K 2.7*   < > 3.5 3.2* 3.4* 3.8 3.2*  CL 92*   < > 100 99 94* 94* 95*  CO2 28   < > 25 27 32 29 29  GLUCOSE 109*   < > 160* 129* 92 204* 218*  BUN 9   < > 16 17 17  30* 34*  CREATININE 0.79   < > 1.00  0.95 1.03 1.33* 0.73  CALCIUM 8.4*   < > 7.8* 8.1* 8.2* 7.6* 8.0*  MG 1.9  --   --   --   --  1.6* 2.4   < > = values in this interval not displayed.   Liver Function Tests: No results for input(s): AST, ALT, ALKPHOS, BILITOT, PROT, ALBUMIN in the last 168 hours. No results for input(s): LIPASE, AMYLASE in the last 168 hours. No results for input(s): AMMONIA in the last 168 hours. CBC: Recent Labs  Lab 12/09/19 0414 12/10/19 0433 12/11/19 0411 12/12/19 0429 12/13/19 0550  WBC 7.0 8.1 7.0 10.8* 11.3*  HGB 12.0* 13.1 13.3 12.0* 12.2*  HCT 36.9* 38.9* 40.5 35.5* 36.5*  MCV 83.3 82.9 83.9 82.4 81.7  PLT 256 238 239 245 266   Cardiac Enzymes: No results for input(s): CKTOTAL, CKMB, CKMBINDEX, TROPONINI in the last 168 hours. BNP: BNP (last 3 results) Recent Labs    08/05/19 0152 12/08/19 0700  BNP 1,110.6* 630.1*    ProBNP (last 3 results) No results for input(s): PROBNP in the last 8760 hours.  CBG: No results for input(s): GLUCAP in the last 168 hours.     Signed:  Desma Maxim MD.  [facility] 12/13/2019, 2:05 PM

## 2019-12-13 NOTE — Progress Notes (Signed)
SUBJECTIVE: Patient resting comfortably in bed. No active complaints. Now on room air. States he walked the hallways today and feels stronger.   Vitals:   12/13/19 0744 12/13/19 0753 12/13/19 1140 12/13/19 1230  BP: 105/63 108/69 101/77 99/77  Pulse: 76 80 71 71  Resp: (!) 29 18 (!) 21 18  Temp: 97.9 F (36.6 C) 98.4 F (36.9 C) 97.8 F (36.6 C) 97.7 F (36.5 C)  TempSrc: Oral Oral Oral Oral  SpO2: 96% 95% 98% 100%  Weight:      Height:        Intake/Output Summary (Last 24 hours) at 12/13/2019 1248 Last data filed at 12/13/2019 1049 Gross per 24 hour  Intake 240 ml  Output 1975 ml  Net -1735 ml    LABS: Basic Metabolic Panel: Recent Labs    12/12/19 0429 12/13/19 0550  NA 133* 133*  K 3.8 3.2*  CL 94* 95*  CO2 29 29  GLUCOSE 204* 218*  BUN 30* 34*  CREATININE 1.33* 0.73  CALCIUM 7.6* 8.0*  MG 1.6* 2.4   Liver Function Tests: No results for input(s): AST, ALT, ALKPHOS, BILITOT, PROT, ALBUMIN in the last 72 hours. No results for input(s): LIPASE, AMYLASE in the last 72 hours. CBC: Recent Labs    12/12/19 0429 12/13/19 0550  WBC 10.8* 11.3*  HGB 12.0* 12.2*  HCT 35.5* 36.5*  MCV 82.4 81.7  PLT 245 266   Cardiac Enzymes: No results for input(s): CKTOTAL, CKMB, CKMBINDEX, TROPONINI in the last 72 hours. BNP: Invalid input(s): POCBNP D-Dimer: No results for input(s): DDIMER in the last 72 hours. Hemoglobin A1C: No results for input(s): HGBA1C in the last 72 hours. Fasting Lipid Panel: No results for input(s): CHOL, HDL, LDLCALC, TRIG, CHOLHDL, LDLDIRECT in the last 72 hours. Thyroid Function Tests: No results for input(s): TSH, T4TOTAL, T3FREE, THYROIDAB in the last 72 hours.  Invalid input(s): FREET3 Anemia Panel: No results for input(s): VITAMINB12, FOLATE, FERRITIN, TIBC, IRON, RETICCTPCT in the last 72 hours.   PHYSICAL EXAM General: Well developed, well nourished, in no acute distress HEENT:  Normocephalic and atramatic Neck:  No JVD.   Lungs: Clear bilaterally to auscultation and percussion. Heart: HRRR . Normal S1 and S2 without gallops or murmurs.  Abdomen: Bowel sounds are positive, abdomen soft and non-tender  Msk:  Back normal, normal gait. Normal strength and tone for age. Extremities: No clubbing, cyanosis or edema.   Neuro: Alert and oriented X 3. Psych:  Good affect, responds appropriately  TELEMETRY: 86/bpm. NSR  ASSESSMENT AND PLAN: Patient presenting to the emergency department with chest pain and shortness of breath after being noncompliant with medications for 1 week.Patient remains in NSR. Please continue amiodarone200mg , metoprolol 100mg ,and Xarelto. Patient with improving dyspnea and now on RA. With worsening HFrEF and NCIM patient will be discharged on a LifeVest and please ensure he is discharged with entresto. Can hold spironolactone as patient continues to be mildly hypotensive. From a cardiac point of view patient is stable for discharge. We will see him early next week in our office for further management.  Principal Problem:   Atrial fibrillation with RVR (HCC) Active Problems:   HTN (hypertension)   Interstitial lung disease (HCC)   Chronic systolic CHF (congestive heart failure) (HCC)   Rheumatoid arthritis involving multiple sites with positive rheumatoid factor (HCC)   History of alcoholism (HCC)   Hypokalemia   Pulmonary fibrosis (HCC)   Slurred speech   Chest pain   Elevated troponin   Atrial fibrillation (  [place])    Jason Sill, NP-C 12/13/2019 12:48 PM

## 2019-12-13 NOTE — Progress Notes (Signed)
Patient discharged home as per order, discharge instruction provided, patient discharge with life vest as per order,

## 2019-12-13 NOTE — Progress Notes (Signed)
Physical Therapy Treatment Patient Details Name: Jason Fitzgerald MRN: [id number] DOB: Jun 10, 1950 Today's Date: 12/13/2019    History of Present Illness Pt is 69 y/o M with PMH: COPD, pulmonary fibrosis, CHF, alcohol abuse, and Afib. Presented d/t feeling chest pain, SOB, and dizziness x2-3 days. Pt also reported some slurred speech whcih has resolved and MRI was negative. Pt was placed on CIWA d/t alc w/d. Pt found to be in Afib with RVR, likely d/t medication noncompliance per MD note.    PT Comments    Patient received in bed, RN present in room. Patient agreeable to PT session. Reports improvement in pain this day. He does not want to sit in recliner as he states "it hurts my buttocks." He agrees to walk. Patient is mod independent with bed mobility. Transfers with min guard for initial steadying. Min guard with initial walking due to unsteadiness and patient reaching for bed, counter to assist with stabilization. As distance progressed he was able to ambulated without UE support. Patient will continue to benefit from skilled PT while here to improve functional mobility and independence.        Follow Up Recommendations  Home health PT;Other (comment) (patient refusing)     Equipment Recommendations  None recommended by PT    Recommendations for Other Services       Precautions / Restrictions Precautions Precautions: Fall Restrictions Weight Bearing Restrictions: No    Mobility  Bed Mobility Overal bed mobility: Modified Independent Bed Mobility: Supine to Sit;Sit to Supine     Supine to sit: Modified independent (Device/Increase time) Sit to supine: Modified independent (Device/Increase time)      Transfers Overall transfer level: Needs assistance Equipment used: 1 person hand held assist Transfers: Sit to/from Stand Sit to Stand: Min guard            Ambulation/Gait Ambulation/Gait assistance: Min guard Gait Distance (Feet): 60 Feet Assistive device: 1  person hand held assist Gait Pattern/deviations: Step-through pattern;Decreased stride length;Decreased step length - right;Decreased step length - left Gait velocity: decreased   General Gait Details: ambulated inititally with single UE support on bed, counter. Progressed to no UE support with increased distance.  Improved balance this session   Stairs             Wheelchair Mobility    Modified Rankin (Stroke Patients Only)       Balance Overall balance assessment: Modified Independent Sitting-balance support: Feet supported Sitting balance-[name]: Good Sitting balance - Comments: able to sit unsupported   Standing balance support: Single extremity supported;No upper extremity supported;During functional activity Standing balance-[name]: Good Standing balance comment: min guard initially for standing and steadying                            Cognition Arousal/Alertness: Awake/alert Behavior During Therapy: WFL for tasks assessed/performed Overall Cognitive Status: Within Functional Limits for tasks assessed                                        Exercises      General Comments        Pertinent Vitals/Pain Pain Assessment: No/denies pain    Home Living Family/patient expects to be discharged to:: Private residence Living Arrangements: Alone Available Help at Discharge: Neighbor;Available PRN/intermittently;Family Type of Home: Apartment Home Access: Level entry   Home Layout: One level  Home Equipment: Shower seat;Bedside commode      Prior Function Level of Independence: Independent          PT Goals (current goals can now be found in the care plan section) Acute Rehab PT Goals Patient Stated Goal: to go home PT Goal Formulation: With patient Time For Goal Achievement: 12/26/19 Potential to Achieve Goals: Good Progress towards PT goals: Progressing toward goals    Frequency    Min 2X/week      PT  Plan Current plan remains appropriate    Co-evaluation              AM-PAC PT "6 Clicks" Mobility   Outcome Measure  Help needed turning from your back to your side while in a flat bed without using bedrails?: None Help needed moving from lying on your back to sitting on the side of a flat bed without using bedrails?: None Help needed moving to and from a bed to a chair (including a wheelchair)?: A Little Help needed standing up from a chair using your arms (e.g., wheelchair or bedside chair)?: A Little Help needed to walk in hospital room?: A Little Help needed climbing 3-5 steps with a railing? : A Lot 6 Click Score: 19    End of Session Equipment Utilized During Treatment: Gait belt Activity Tolerance: Patient tolerated treatment well Patient left: in bed;with call bell/phone within reach Nurse Communication: Mobility status PT Visit Diagnosis: Unsteadiness on feet (R26.81);Other abnormalities of gait and mobility (R26.89);Muscle weakness (generalized) (M62.81);Difficulty in walking, not elsewhere classified (R26.2)     Time: 1205-1225 PT Time Calculation (min) (ACUTE ONLY): 20 min  Charges:  $Gait Training: 8-22 mins                     Edee Nifong, PT, GCS 12/13/19,12:53 PM

## 2019-12-13 NOTE — TOC Transition Note (Signed)
Transition of Care [facility]) - CM/SW Discharge Note   Patient Details  Name: Jason Fitzgerald MRN: [id number] Date of Birth: Mar 23, 1950  [facility] CM/SW Contact:  Eileen Stanford, LCSW Phone Number: 12/13/2019, 3:08 PM   Clinical Narrative:   Pt refusing HH, no needs at this time. Pt is d/c.   Final next level of care: Home/Self Care Barriers to Discharge: No Barriers Identified   Patient Goals and CMS Choice Patient states their goals for this hospitalization and ongoing recovery are:: to get stronger      Discharge Placement                    Patient and family notified of of transfer: 12/13/19  Discharge Plan and Services In-house Referral: Clinical Social Work   Post Acute Care Choice: Home Health                               Social Determinants of Health (SDOH) Interventions     Readmission Risk Interventions Readmission Risk Prevention Plan 12/12/2019 12/10/2019  Transportation Screening Complete Complete  PCP or Specialist Appt within 3-5 Days Complete Complete  HRI or [place] Complete Complete  Social Work Consult for [place] Planning/Counseling Complete Complete  Palliative Care Screening Complete Not Applicable  Medication Review [employer]) Complete Complete  Some recent data might be hidden

## 2019-12-14 NOTE — Progress Notes (Deleted)
Patient ID: Jason Fitzgerald, male    DOB: 01-15-1951, 69 y.o.   MRN: [id number]  HPI  Jason Fitzgerald is a 69 y/o male with a history of COPD, pulmonary fibrosis, GERD, previous cocaine use and chronic heart failure.   Echo report from 12/09/19 reviewed and showed an EF of 25-30% along with moderately elevated PA pressure and mild/moderate Jason. Echo report from 03/04/17 showed an EF of 25-30% along with trivial AR, mild Jason, moderate TR and elevated PA pressure of 46 mm Hg.   Cardiac catheterization done 03/05/17 which showed normal coronaries with severe LV dysfunction and severe pulmonary HTN.  Admitted 12/08/19 due to AF with RVR after not taking medications for the previous 5 days. Cardiology and pulmonology consults obtained. Potassium 2.7, head CT negative. Given IV lasix with transition to oral diuretics. Elevated troponin thought to be due to demand ischemia. Brain MRI done due to slurring speech which was negative for CVA. Discharged after 5 days.    Jason Fitzgerald presents today for a follow-up visit although hasn't been seen since November 2019. Jason Fitzgerald presents with a chief complaint of  Past Medical History:  Diagnosis Date  . CHF (congestive heart failure) ([place])   . Chronic cough   . COPD (chronic obstructive pulmonary disease) ([place])   . Elevated liver function tests   . Emphysema of lung ([place])   . Fibrosis, pulmonary, interstitial, diffuse ([place])   . GERD (gastroesophageal reflux disease)   . History of cocaine abuse ([place])   . Mediastinal lymphadenopathy   . Pulmonary fibrosis ([place])   . Right inguinal hernia    Past Surgical History:  Procedure Laterality Date  . COLONOSCOPY WITH PROPOFOL N/A 08/19/2018   Procedure: COLONOSCOPY WITH PROPOFOL;  Surgeon: Virgel Manifold, MD;  Location: [facility] ENDOSCOPY;  Service: Endoscopy;  Laterality: N/A;  . HERNIA REPAIR Right 1973   open  . INGUINAL HERNIA REPAIR Right 11/08/2014   Procedure: LAPAROSCOPIC RIGHT INGUINAL HERNIA REPAIR;  Surgeon: Hubbard Robinson, MD;  Location: [facility] ORS;  Service: General;  Laterality: Right;  . knee arthroscopy Right 1972  . RIGHT/LEFT HEART CATH AND CORONARY ANGIOGRAPHY Right 03/05/2017   Procedure: RIGHT/LEFT HEART CATH AND CORONARY ANGIOGRAPHY;  Surgeon: Dionisio David, MD;  Location: [place] [facility];  Service: Cardiovascular;  Laterality: Right;   Family History  Problem Relation Age of Onset  . Diabetes Mother   . Cancer Father   . AAA (abdominal aortic aneurysm) Brother    Social History   Tobacco Use  . Smoking status: Never Smoker  . Smokeless tobacco: Never Used  Substance Use Topics  . Alcohol use: Not Currently    Alcohol/week: 0.0 standard drinks    Comment: 2 quarts a week, Jason Fitzgerald quit again 12/2018   No Known Allergies    Review of Systems  Constitutional: Positive for fatigue (minimal). Negative for appetite change.  HENT: Negative for congestion, rhinorrhea and sore throat.   Eyes: Negative.   Respiratory: Positive for cough (when eating or when getting excited). Negative for chest tightness and shortness of breath.   Cardiovascular: Positive for leg swelling (slight swelling in right lower leg). Negative for chest pain and palpitations.  Gastrointestinal: Negative for abdominal distention and abdominal pain.  Endocrine: Negative.   Genitourinary: Negative.   Musculoskeletal: Positive for arthralgias (right shoulder pain). Negative for back pain and neck pain.  Skin: Negative.   Allergic/Immunologic: Negative.   Neurological: Positive for dizziness and numbness (right foot). Negative for light-headedness.  Hematological: Negative for adenopathy. Does not bruise/bleed easily.  Psychiatric/Behavioral: Positive for sleep disturbance (now retired; still having trouble sleeping). Negative for dysphoric mood. The patient is not nervous/anxious.      Physical Exam Vitals and nursing note reviewed.  Constitutional:      Appearance: Jason Fitzgerald is well-developed.  HENT:     Head:  Normocephalic and atraumatic.  Neck:     Vascular: No JVD.  Cardiovascular:     Rate and Rhythm: Normal rate. Rhythm irregularly irregular.  Pulmonary:     Effort: Pulmonary effort is normal.     Breath sounds: No wheezing or rales.  Abdominal:     General: There is no distension.     Palpations: Abdomen is soft.     Tenderness: There is no abdominal tenderness.  Musculoskeletal:        General: No tenderness.     Cervical back: Normal range of motion and neck supple.  Skin:    General: Skin is warm and dry.  Neurological:     Mental Status: Jason Fitzgerald is alert and oriented to person, place, and time.  Psychiatric:        Behavior: Behavior normal.        Thought Content: Thought content normal.    Assessment & Plan:  1: Chronic heart failure with reduced ejection fraction- - NYHA class II - euvolemic today - weighing daily; Reminded to call for an overnight weight gain of >2 pounds or a weekly weight gain of >5 pounds - not adding salt to his food and Jason Fitzgerald was reminded to closely follow a 2000mg  sodium diet  - BP will not allow titration of medications at this time - saw cardiology Humphrey Rolls)  - BNP 12/08/19 was 630.1  2: HTN- - BP  - saw PCP Ancil Boozer) 09/20/19 - BMP from 12/13/19 was reviewed and showed sodium 133, potassium 3.2, creatinine 0.73 and GFR >60  3: Atrial fibrillation- - currently rate controlled  4: Pulmonary HTN- - using his inhalers - saw pulmonologist Raul Del) 11/27/19   Patient did not bring his medications nor a list. Each medication was verbally reviewed with the patient and Jason Fitzgerald was encouraged to bring the bottles to every visit to confirm accuracy of list.

## 2019-12-18 ENCOUNTER — Telehealth: Payer: Self-pay | Admitting: Family

## 2019-12-18 ENCOUNTER — Ambulatory Visit: Payer: Medicare Other | Admitting: Family

## 2019-12-18 NOTE — Telephone Encounter (Signed)
Patient did not show for his Heart Failure Clinic appointment on 12/18/19. Will attempt to reschedule.

## 2019-12-26 ENCOUNTER — Ambulatory Visit: Payer: Medicare Other | Attending: Specialist

## 2020-01-02 ENCOUNTER — Ambulatory Visit: Payer: Medicare Other | Admitting: Family

## 2020-01-02 ENCOUNTER — Telehealth: Payer: Self-pay | Admitting: Family

## 2020-01-02 NOTE — Telephone Encounter (Signed)
Patient did not show for his Heart Failure Clinic appointment on 01/02/20. Will attempt to reschedule.  °

## 2020-01-12 DIAGNOSIS — I420 Dilated cardiomyopathy: Secondary | ICD-10-CM | POA: Diagnosis not present

## 2020-02-12 DIAGNOSIS — I420 Dilated cardiomyopathy: Secondary | ICD-10-CM | POA: Diagnosis not present

## 2020-03-14 DIAGNOSIS — I420 Dilated cardiomyopathy: Secondary | ICD-10-CM | POA: Diagnosis not present

## 2020-03-20 NOTE — Progress Notes (Deleted)
Name: Jason Fitzgerald   MRN: [id number]    DOB: 08-14-1950   Date:03/20/2020       Progress Note  Subjective  Chief Complaint  Follow up   HPI History of alcoholism: hequit early 2020, had a relapse but said not drinking since end of 2020. He is drinking water, sweet tea  and ginger ale. Appetite waxes and wanes, weight is stable   He states he drinks apple juice    HTN/CHF and Afib: rate controlled with beta-blocker, amiodarone, also taking aldactone,since recent episode of acute CHF - July 2021 he is back on lasix andpotassium, he has been taking Atorvastatin .He has SOB with a activities but denies, palpitationor orthopnea, he states since last visit to [facility] and back on lasix lower extremity edema has resolved . He is taking Xarelto and aspirin given by Dr. Humphrey Rolls  RA: under the care of Dr. Jefm Bryant. Heis off plaquenil ( he states stopped by cardiologist) still takingprednisone and [place]. He states pain is under control at this timehe states, he states only problem currently is left elbow when he wakes up, no longer having knee problem   CAD: he states he has been told he had a heart attack, normal LDL, he is on statin therapy. He is under the care of Dr. Humphrey Rolls   Pulmonary fiboris: never smoked, states his mother did and also co-workers- exposure to second hand smoking , under the care of Dr. Raul Del, has daily cough but nowheezing , hehas SOB with activity, CT chest done 2020  showed bilateral interstitial lung opacities with pulmonary fibrosis.He is on Symbicort   IMPRESSION: 1. 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 [place], Iss 5, [phone], Oct 03 2016. 2. One vessel coronary atherosclerosis. 3. Prominent symmetric bilateral gynecomastia, worsened.  Aortic Atherosclerosis (ICD10-I70.0) : he is on statin therapy and aspirin   Neuropathy: on both feet, he states gabapentin has helped left leg more than right ,he has toe tingling. He has seen Dr. Manuella Ghazi in the past. He states feeling better since he quit drinking, he would like a refill of vitamin B1  Anemia: he has not seen hematologist, platelets has normalized anemia has been stable, reviewed labs done July 2021 . Anemia of chronic disease   *** Patient Active Problem List   Diagnosis Date Noted  . Atrial fibrillation ([place]) 12/09/2019  . Atrial fibrillation with RVR ([place]) 12/08/2019  . Slurred speech 12/08/2019  . Chest pain 12/08/2019  . Elevated troponin 12/08/2019  . Acute CHF (congestive heart failure) ([place]) 08/05/2019  . Diarrhea   . Dizziness 12/17/2018  . Hypotension 12/17/2018  . Hypophosphatemia 12/17/2018  . Hypomagnesemia 12/17/2018  . Positive D-dimer 12/17/2018  . Abnormal LFTs (liver function tests) 12/17/2018  . Anemia 12/17/2018  . Pulmonary fibrosis ([place])   . GERD (gastroesophageal reflux disease)   . COPD (chronic obstructive pulmonary disease) ([place])   . CHF (congestive heart failure) ([place])   . Encounter for screening colonoscopy   . Polyp of transverse colon   . Internal hemorrhoids   . Hypokalemia 02/03/2018  . History of alcoholism ([place]) 12/08/2017  . Rheumatoid arthritis involving multiple sites with positive rheumatoid factor ([place]) 10/13/2017  . Chronic systolic CHF (congestive heart failure) ([place]) 03/12/2017  . AF (paroxysmal atrial fibrillation) ([place])  03/12/2017  . Arthritis of left knee 08/01/2016  . 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 Chronic cough 05/29/2015  . CAFL (chronic airflow limitation) ([place]) 10/24/2014  . Arteriosclerosis of coronary artery 10/24/2014  . History of fundoplication 09/62/8366  . HTN  (hypertension) 10/24/2014  . LAD (lymphadenopathy), mediastinal 07/13/2013  . Interstitial lung disease ([place]) 06/19/2013  . 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 COLONOSCOPY WITH PROPOFOL N/A 08/19/2018   Procedure: COLONOSCOPY WITH PROPOFOL;  Surgeon: Virgel Manifold, MD;  Location: [facility] ENDOSCOPY;  Service: Endoscopy;  Laterality: N/A;  . HERNIA REPAIR Right 1973   open  . INGUINAL HERNIA REPAIR Right 11/08/2014   Procedure: LAPAROSCOPIC RIGHT INGUINAL HERNIA REPAIR;  Surgeon: Hubbard Robinson, MD;  Location: [facility] ORS;  Service: General;  Laterality: Right;  . knee arthroscopy Right 1972  . RIGHT/LEFT HEART CATH AND CORONARY ANGIOGRAPHY Right 03/05/2017   Procedure: RIGHT/LEFT HEART CATH AND CORONARY ANGIOGRAPHY;  Surgeon: Dionisio David, MD;  Location: [place] [facility];  Service: Cardiovascular;  Laterality: Right;    Family History  Problem Relation Age of Onset  . Diabetes Mother   . Cancer Father   . AAA (abdominal aortic aneurysm) Brother     Social History   Tobacco Use  . Smoking status: Never Smoker  . Smokeless tobacco: Never Used  Substance Use Topics  . Alcohol use: Not Currently    Alcohol/week: 0.0 standard drinks    Comment: 2 quarts a week, he quit again 12/2018     Current Outpatient Medications:  .  amiodarone (PACERONE) 200 MG tablet, Take 200 mg by mouth daily., Disp: , Rfl:  .  aspirin EC 81 MG tablet, Take 1 tablet (81 mg total) by mouth daily. Swallow whole., Disp: 30 tablet, Rfl: 11 .  atorvastatin (LIPITOR) 10 MG tablet, Take 1 tablet (10 mg total) by mouth daily., Disp: 90 tablet, Rfl: 1 .  budesonide-formoterol (SYMBICORT) 160-4.5 MCG/ACT inhaler, Inhale 1-2 puffs into the lungs daily as needed (respiratory difficulty). , Disp: , Rfl:  .  furosemide (LASIX) 40 MG tablet, Take 1 tablet (40 mg total) by mouth daily., Disp: 30 tablet, Rfl: 0 .  leflunomide (ARAVA) 10 MG  tablet, Take 10 mg by mouth daily. , Disp: , Rfl:  .  metoprolol succinate (TOPROL-XL) 100 MG 24 hr tablet, Take 1 tablet (100 mg total) by mouth daily. Take with or immediately following a meal., Disp: 30 tablet, Rfl: 1 .  potassium chloride SA (KLOR-CON) 20 MEQ tablet, Take 1 tablet (20 mEq total) by mouth daily., Disp: 30 tablet, Rfl: 0 .  predniSONE (DELTASONE) 5 MG tablet, Take 2.5 mg by mouth daily. , Disp: , Rfl:  .  rivaroxaban (XARELTO) 20 MG TABS tablet, Take 1 tablet (20 mg total) by mouth daily with supper., Disp: 30 tablet, Rfl: 0 .  sacubitril-valsartan (ENTRESTO) 24-26 MG, Take 1 tablet by mouth 2 (two) times daily., Disp: 60 tablet, Rfl: 1 .  thiamine 100 MG tablet, Take 1 tablet (100 mg total) by mouth daily. Every other day, Disp: 45 tablet, Rfl: 1  No Known Allergies  I personally reviewed {Reviewed:[id number]} with the patient/caregiver today.   ROS  ***  Objective  There were no vitals filed for this visit.  There is no height or weight on file to calculate BMI.  Physical Exam ***  No results found for this or any previous visit (from the past 2160 hour(s)).  Diabetic Foot Exam: Diabetic Foot Exam - Simple   No data filed    ***  PHQ2/9: Depression screen [facility] 2/9 09/20/2019 06/19/2019 04/18/2019 04/12/2019 03/20/2019  Decreased Interest 1 0 0 0 0  Down, Depressed, Hopeless 0 0 0 0 0  PHQ - 2 Score 1 0 0 0 0  Altered sleeping 2 0 - 1 0  Tired, decreased energy 2 0 - 2 0  Change in appetite 0 0 - 3 0  Feeling bad or failure about yourself  0 0 - 1 0  Trouble concentrating 3 0 - 0 0  Moving slowly or fidgety/restless 1 0 - 0 0  Suicidal thoughts 0 0 - 0 0  PHQ-9 Score 9 0 - 7 0  Difficult doing work/chores Somewhat difficult - - Not difficult at all Not difficult at all  Some recent data might be hidden    phq 9 is {gen pos [id number]} ***  Fall Risk: Fall Risk  09/20/2019 06/19/2019 04/18/2019 04/12/2019 03/20/2019  Falls in the past year? 0 0 0 0 0   Number falls in past yr: 0 0 0 0 0  Injury with Fall? 0 0 0 0 0  Risk for fall due to : - - Orthopedic patient - -  Follow up - - Falls prevention discussed - -   ***   Functional Status Survey:   ***   Assessment & Plan  *** There are no diagnoses linked to this encounter.

## 2020-03-22 ENCOUNTER — Ambulatory Visit: Payer: Medicare Other | Admitting: Family Medicine

## 2020-04-18 ENCOUNTER — Emergency Department: Payer: Medicare Other

## 2020-04-18 ENCOUNTER — Encounter: Payer: Self-pay | Admitting: Emergency Medicine

## 2020-04-18 ENCOUNTER — Other Ambulatory Visit: Payer: Self-pay

## 2020-04-18 ENCOUNTER — Ambulatory Visit: Payer: Medicare Other

## 2020-04-18 ENCOUNTER — Inpatient Hospital Stay
Admit: 2020-04-18 | Discharge: 2020-04-18 | Disposition: A | Discharge status: Home / Self Care | Payer: Medicare Other | Attending: Nurse Practitioner | Admitting: Nurse Practitioner

## 2020-04-18 ENCOUNTER — Inpatient Hospital Stay
Admission: EM | Admit: 2020-04-18 | Discharge: 2020-04-22 | DRG: 309 | Disposition: A | Discharge status: Home / Self Care | Payer: Medicare Other | Attending: Internal Medicine | Admitting: Internal Medicine

## 2020-04-18 DIAGNOSIS — E876 Hypokalemia: Secondary | ICD-10-CM | POA: Diagnosis present

## 2020-04-18 DIAGNOSIS — I482 Chronic atrial fibrillation, unspecified: Secondary | ICD-10-CM | POA: Diagnosis not present

## 2020-04-18 DIAGNOSIS — N179 Acute kidney failure, unspecified: Secondary | ICD-10-CM | POA: Diagnosis not present

## 2020-04-18 DIAGNOSIS — I959 Hypotension, unspecified: Secondary | ICD-10-CM | POA: Diagnosis not present

## 2020-04-18 DIAGNOSIS — M069 Rheumatoid arthritis, unspecified: Secondary | ICD-10-CM | POA: Diagnosis present

## 2020-04-18 DIAGNOSIS — J90 Pleural effusion, not elsewhere classified: Secondary | ICD-10-CM | POA: Diagnosis not present

## 2020-04-18 DIAGNOSIS — I480 Paroxysmal atrial fibrillation: Principal | ICD-10-CM | POA: Diagnosis present

## 2020-04-18 DIAGNOSIS — E785 Hyperlipidemia, unspecified: Secondary | ICD-10-CM | POA: Diagnosis present

## 2020-04-18 DIAGNOSIS — F1020 Alcohol dependence, uncomplicated: Secondary | ICD-10-CM | POA: Diagnosis present

## 2020-04-18 DIAGNOSIS — I440 Atrioventricular block, first degree: Secondary | ICD-10-CM | POA: Diagnosis not present

## 2020-04-18 DIAGNOSIS — Z7189 Other specified counseling: Secondary | ICD-10-CM | POA: Diagnosis not present

## 2020-04-18 DIAGNOSIS — K219 Gastro-esophageal reflux disease without esophagitis: Secondary | ICD-10-CM | POA: Diagnosis present

## 2020-04-18 DIAGNOSIS — I4891 Unspecified atrial fibrillation: Principal | ICD-10-CM | POA: Diagnosis present

## 2020-04-18 DIAGNOSIS — Z20822 Contact with and (suspected) exposure to covid-19: Secondary | ICD-10-CM | POA: Diagnosis present

## 2020-04-18 DIAGNOSIS — J8410 Pulmonary fibrosis, unspecified: Secondary | ICD-10-CM | POA: Diagnosis not present

## 2020-04-18 DIAGNOSIS — Z91148 Patient's other noncompliance with medication regimen for other reason: Secondary | ICD-10-CM

## 2020-04-18 DIAGNOSIS — Z9114 Patient's other noncompliance with medication regimen: Secondary | ICD-10-CM | POA: Diagnosis not present

## 2020-04-18 DIAGNOSIS — I5022 Chronic systolic (congestive) heart failure: Secondary | ICD-10-CM | POA: Diagnosis present

## 2020-04-18 DIAGNOSIS — Z66 Do not resuscitate: Secondary | ICD-10-CM

## 2020-04-18 DIAGNOSIS — I428 Other cardiomyopathies: Secondary | ICD-10-CM | POA: Diagnosis present

## 2020-04-18 DIAGNOSIS — J449 Chronic obstructive pulmonary disease, unspecified: Secondary | ICD-10-CM | POA: Diagnosis not present

## 2020-04-18 DIAGNOSIS — F1021 Alcohol dependence, in remission: Secondary | ICD-10-CM | POA: Diagnosis not present

## 2020-04-18 DIAGNOSIS — I110 Hypertensive heart disease with heart failure: Secondary | ICD-10-CM | POA: Diagnosis not present

## 2020-04-18 DIAGNOSIS — R0602 Shortness of breath: Secondary | ICD-10-CM

## 2020-04-18 DIAGNOSIS — Z515 Encounter for palliative care: Secondary | ICD-10-CM | POA: Diagnosis not present

## 2020-04-18 DIAGNOSIS — R079 Chest pain, unspecified: Secondary | ICD-10-CM | POA: Diagnosis not present

## 2020-04-18 DIAGNOSIS — I340 Nonrheumatic mitral (valve) insufficiency: Secondary | ICD-10-CM | POA: Diagnosis present

## 2020-04-18 DIAGNOSIS — I4581 Long QT syndrome: Secondary | ICD-10-CM | POA: Diagnosis not present

## 2020-04-18 LAB — CBC
HCT: 39.4 % (ref 39.0–52.0)
Hemoglobin: 13.5 g/dL (ref 13.0–17.0)
MCH: 27.8 pg (ref 26.0–34.0)
MCHC: 34.3 g/dL (ref 30.0–36.0)
MCV: 81.1 fL (ref 80.0–100.0)
Platelets: 261 10*3/uL (ref 150–400)
RBC: 4.86 MIL/uL (ref 4.22–5.81)
RDW: 14.6 % (ref 11.5–15.5)
WBC: 6.3 10*3/uL (ref 4.0–10.5)
nRBC: 0 % (ref 0.0–0.2)

## 2020-04-18 LAB — TROPONIN I (HIGH SENSITIVITY)
Troponin I (High Sensitivity): 18 ng/L — ABNORMAL HIGH (ref <18)
Troponin I (High Sensitivity): 15 ng/L (ref <18)

## 2020-04-18 LAB — HIV ANTIBODY (ROUTINE TESTING W REFLEX): HIV Screen 4th Generation wRfx: NONREACTIVE

## 2020-04-18 LAB — BASIC METABOLIC PANEL
Anion gap: 13 (ref 5–15)
Anion gap: 15 (ref 5–15)
BUN: 18 mg/dL (ref 8–23)
BUN: 20 mg/dL (ref 8–23)
CO2: 25 mmol/L (ref 22–32)
CO2: 24 mmol/L (ref 22–32)
Calcium: 7.8 mg/dL — ABNORMAL LOW (ref 8.9–10.3)
Calcium: 7.8 mg/dL — ABNORMAL LOW (ref 8.9–10.3)
Chloride: 94 mmol/L — ABNORMAL LOW (ref 98–111)
Chloride: 95 mmol/L — ABNORMAL LOW (ref 98–111)
Creatinine, Ser: 0.88 mg/dL (ref 0.61–1.24)
Creatinine, Ser: 1.17 mg/dL (ref 0.61–1.24)
GFR, Estimated: >60 mL/min (ref >60)
GFR, Estimated: >60 mL/min (ref >60)
Glucose, Bld: 112 mg/dL — ABNORMAL HIGH (ref 70–99)
Glucose, Bld: 158 mg/dL — ABNORMAL HIGH (ref 70–99)
Potassium: 2.6 mmol/L — CL (ref 3.5–5.1)
Potassium: 3.2 mmol/L — ABNORMAL LOW (ref 3.5–5.1)
Sodium: 132 mmol/L — ABNORMAL LOW (ref 135–145)
Sodium: 134 mmol/L — ABNORMAL LOW (ref 135–145)

## 2020-04-18 LAB — HEPATIC FUNCTION PANEL
ALT: 10 U/L (ref 0–44)
AST: 24 U/L (ref 15–41)
Albumin: 2.7 g/dL — ABNORMAL LOW (ref 3.5–5.0)
Alkaline Phosphatase: 71 U/L (ref 38–126)
Bilirubin, Direct: 0.6 mg/dL — ABNORMAL HIGH (ref 0.0–0.2)
Indirect Bilirubin: 1.3 mg/dL — ABNORMAL HIGH (ref 0.3–0.9)
Total Bilirubin: 1.9 mg/dL — ABNORMAL HIGH (ref 0.3–1.2)
Total Protein: 7.6 g/dL (ref 6.5–8.1)

## 2020-04-18 LAB — BRAIN NATRIURETIC PEPTIDE: B Natriuretic Peptide: 832.7 pg/mL — ABNORMAL HIGH (ref 0.0–100.0)

## 2020-04-18 LAB — RESP PANEL BY RT-PCR (FLU A&B, COVID) ARPGX2
Influenza A by PCR: NEGATIVE
Influenza B by PCR: NEGATIVE
SARS Coronavirus 2 by RT PCR: NEGATIVE

## 2020-04-18 LAB — MAGNESIUM: Magnesium: 1.9 mg/dL (ref 1.7–2.4)

## 2020-04-18 LAB — HEPARIN LEVEL (UNFRACTIONATED): Heparin Unfractionated: <0.1 IU/mL — ABNORMAL LOW (ref 0.30–0.70)

## 2020-04-18 LAB — PROTIME-INR
INR: 1.4 — ABNORMAL HIGH (ref 0.8–1.2)
Prothrombin Time: 16.6 seconds — ABNORMAL HIGH (ref 11.4–15.2)

## 2020-04-18 LAB — APTT: aPTT: 32 seconds (ref 24–36)

## 2020-04-18 LAB — TSH: TSH: 3.963 u[IU]/mL (ref 0.350–4.500)

## 2020-04-18 MED ORDER — AMIODARONE HCL IN DEXTROSE 360-4.14 MG/200ML-% IV SOLN
60.0000 mg/h | INTRAVENOUS | Status: AC
  Administered 2020-04-18 (×2): 60 mg/h via INTRAVENOUS
  Filled 2020-04-18 (×2): qty 200

## 2020-04-18 MED ORDER — ONDANSETRON HCL 4 MG/2ML IJ SOLN: 4.0000 mg | Freq: Four times a day (QID) | INTRAMUSCULAR | Status: DC | PRN

## 2020-04-18 MED ORDER — ASPIRIN 81 MG PO CHEW
81.0000 mg | CHEWABLE_TABLET | Freq: Every day | ORAL | Status: DC
  Administered 2020-04-19 – 2020-04-22 (×4): 81 mg via ORAL
  Filled 2020-04-18 (×4): qty 1

## 2020-04-18 MED ORDER — ATORVASTATIN CALCIUM 20 MG PO TABS
40.0000 mg | ORAL_TABLET | Freq: Every day | ORAL | Status: DC
  Administered 2020-04-19 – 2020-04-22 (×4): 40 mg via ORAL
  Filled 2020-04-18 (×5): qty 2

## 2020-04-18 MED ORDER — AMIODARONE LOAD VIA INFUSION
150.0000 mg | Freq: Once | INTRAVENOUS | Status: AC
  Administered 2020-04-18: 150 mg via INTRAVENOUS
  Filled 2020-04-18: qty 83.34

## 2020-04-18 MED ORDER — APIXABAN 5 MG PO TABS: 5.0000 mg | ORAL_TABLET | Freq: Two times a day (BID) | ORAL | Status: DC

## 2020-04-18 MED ORDER — POTASSIUM CHLORIDE CRYS ER 20 MEQ PO TBCR
40.0000 meq | EXTENDED_RELEASE_TABLET | Freq: Once | ORAL | Status: AC
  Administered 2020-04-18: 40 meq via ORAL
  Filled 2020-04-18: qty 2

## 2020-04-18 MED ORDER — LORAZEPAM 2 MG/ML IJ SOLN
0.0000 mg | Freq: Four times a day (QID) | INTRAMUSCULAR | Status: DC
  Administered 2020-04-18: 2 mg via INTRAVENOUS
  Filled 2020-04-18: qty 1

## 2020-04-18 MED ORDER — ACETAMINOPHEN 325 MG PO TABS
650.0000 mg | ORAL_TABLET | ORAL | Status: DC | PRN
  Administered 2020-04-20: 650 mg via ORAL
  Filled 2020-04-18 (×3): qty 2

## 2020-04-18 MED ORDER — METOPROLOL SUCCINATE ER 25 MG PO TB24
25.0000 mg | ORAL_TABLET | Freq: Every day | ORAL | Status: DC
  Administered 2020-04-18: 25 mg via ORAL
  Filled 2020-04-18: qty 1

## 2020-04-18 MED ORDER — ADULT MULTIVITAMIN W/MINERALS CH
1.0000 | ORAL_TABLET | Freq: Every day | ORAL | Status: DC
  Administered 2020-04-19 – 2020-04-22 (×4): 1 via ORAL
  Filled 2020-04-18 (×4): qty 1

## 2020-04-18 MED ORDER — LORAZEPAM 2 MG/ML IJ SOLN: 0.0000 mg | Freq: Two times a day (BID) | INTRAMUSCULAR | Status: DC

## 2020-04-18 MED ORDER — THIAMINE HCL 100 MG PO TABS
100.0000 mg | ORAL_TABLET | Freq: Every day | ORAL | Status: DC
  Administered 2020-04-19 – 2020-04-22 (×4): 100 mg via ORAL
  Filled 2020-04-18 (×5): qty 1

## 2020-04-18 MED ORDER — POTASSIUM CHLORIDE 10 MEQ/100ML IV SOLN
10.0000 meq | Freq: Once | INTRAVENOUS | Status: AC
  Administered 2020-04-18: 10 meq via INTRAVENOUS
  Filled 2020-04-18: qty 100

## 2020-04-18 MED ORDER — MAGNESIUM SULFATE 2 GM/50ML IV SOLN
2.0000 g | Freq: Once | INTRAVENOUS | Status: AC
  Administered 2020-04-18: 2 g via INTRAVENOUS
  Filled 2020-04-18: qty 50

## 2020-04-18 MED ORDER — LORAZEPAM 1 MG PO TABS
1.0000 mg | ORAL_TABLET | ORAL | Status: AC | PRN
  Administered 2020-04-20: 1 mg via ORAL
  Filled 2020-04-18: qty 1

## 2020-04-18 MED ORDER — METOPROLOL SUCCINATE ER 50 MG PO TB24: 100.0000 mg | ORAL_TABLET | Freq: Every day | ORAL | Status: DC

## 2020-04-18 MED ORDER — FOLIC ACID 1 MG PO TABS
1.0000 mg | ORAL_TABLET | Freq: Every day | ORAL | Status: DC
  Administered 2020-04-19 – 2020-04-22 (×4): 1 mg via ORAL
  Filled 2020-04-18 (×5): qty 1

## 2020-04-18 MED ORDER — METOPROLOL SUCCINATE ER 50 MG PO TB24: 50.0000 mg | ORAL_TABLET | Freq: Every day | ORAL | Status: DC

## 2020-04-18 MED ORDER — THIAMINE HCL 100 MG/ML IJ SOLN
100.0000 mg | Freq: Every day | INTRAMUSCULAR | Status: DC
  Filled 2020-04-18: qty 2

## 2020-04-18 MED ORDER — POTASSIUM CHLORIDE CRYS ER 20 MEQ PO TBCR
40.0000 meq | EXTENDED_RELEASE_TABLET | Freq: Once | ORAL | Status: AC
  Administered 2020-04-19: 40 meq via ORAL
  Filled 2020-04-18: qty 2

## 2020-04-18 MED ORDER — LORAZEPAM 2 MG/ML IJ SOLN: 1.0000 mg | INTRAMUSCULAR | Status: AC | PRN

## 2020-04-18 MED ORDER — ENOXAPARIN SODIUM 80 MG/0.8ML ~~LOC~~ SOLN
1.0000 mg/kg | Freq: Two times a day (BID) | SUBCUTANEOUS | Status: DC
  Administered 2020-04-18 – 2020-04-19 (×4): 77.5 mg via SUBCUTANEOUS
  Filled 2020-04-18 (×5): qty 0.8

## 2020-04-18 MED ORDER — ASPIRIN 81 MG PO CHEW
324.0000 mg | CHEWABLE_TABLET | Freq: Once | ORAL | Status: AC
  Administered 2020-04-18: 324 mg via ORAL
  Filled 2020-04-18: qty 4

## 2020-04-18 MED ORDER — AMIODARONE HCL IN DEXTROSE 360-4.14 MG/200ML-% IV SOLN
30.0000 mg/h | INTRAVENOUS | Status: DC
  Administered 2020-04-18 (×2): 30 mg/h via INTRAVENOUS
  Filled 2020-04-18: qty 200

## 2020-04-18 NOTE — Progress Notes (Signed)
*  PRELIMINARY RESULTS* Echocardiogram 2D Echocardiogram has been performed.  Jason Fitzgerald 04/18/2020, 2:13 PM

## 2020-04-18 NOTE — Consult Note (Signed)
ANTICOAGULATION CONSULT NOTE - Initial Consult  Pharmacy Consult for therapeutic enoxaparin Indication: atrial fibrillation   No Known Allergies  Vital Signs: Temp: 97.6 F (36.4 C) (03/17 0835) Temp Source: Oral (03/17 0835) BP: 93/81 (03/17 0900) Pulse Rate: 33 (03/17 0900)  Labs: Recent Labs    04/18/20 0832  HGB 13.5  HCT 39.4  PLT 261    Estimated Creatinine Clearance: 80.3 mL/min (by C-G formula based on SCr of 0.88 mg/dL).   Medical History: Past Medical History:  Diagnosis Date  . CHF (congestive heart failure) ([place])   . Chronic cough   . COPD (chronic obstructive pulmonary disease) ([place])   . Elevated liver function tests   . Emphysema of lung ([place])   . Fibrosis, pulmonary, interstitial, diffuse ([place])   . GERD (gastroesophageal reflux disease)   . History of cocaine abuse ([place])   . Mediastinal lymphadenopathy   . Pulmonary fibrosis ([place])   . Right inguinal hernia     Medications:  Per med rec pt has not been using anticoagulation prior to admission  Assessment: 70 yo male with history of med non-compliance and a fib presentiing with 3 days of worsening CP/SOB.  Pharmacy has been consulted to intitiate and montor therapeutic lovenox for a fib.  Goal of Therapy:  Monitor platelets by anticoagulation protocol: Yes   Plan:  Will obtain baseline INR/CBC/Scr  Will start lovenox 1mg /kg q12h  Lu Duffel, PharmD, BCPS Clinical Pharmacist 04/18/2020 9:55 AM

## 2020-04-18 NOTE — ED Triage Notes (Signed)
Pt to ED via POV with c/o L sided CP x 2 days, pt states recently admitted for heart attack. Pt states pain radiates to upper back. Pt states feels like he is having another heart attack. Pt with noted tachypnea and labored respirations at this time. Pt pulled for EKG, charge RN made aware and patient taken to room 7.

## 2020-04-18 NOTE — Consult Note (Signed)
MEDICATION RELATED CONSULT NOTE - FOLLOW UP   Pharmacy Consult for DDI's with new start Amiodarone   No Known Allergies  Vital Signs: Temp: 97.6 F (36.4 C) (03/17 0835) Temp Source: Oral (03/17 0835) BP: 113/95 (03/17 0845) Pulse Rate: 129 (03/17 0845) Intake/Output from previous day: No intake/output data recorded. Intake/Output from this shift: No intake/output data recorded.  Medications/Assesment:  Entresto/furosemide/amiodarone - watch for hypotension Metoprolol/amiodarone - watch for bradycardia Furosemide/amiodarone - watch for additive qt prolongation

## 2020-04-18 NOTE — Progress Notes (Signed)
Patient has not voided since arriving to unit.  Bladder scanned showed 65ml in bladder.  Will continue to monitor.

## 2020-04-18 NOTE — ED Provider Notes (Signed)
[facility] Emergency Department Provider Note    Event Date/Time   First MD Initiated Contact with Patient 04/18/20 [phone]     (approximate)  I have reviewed the triage vital signs and the nursing notes.   HISTORY  Chief Complaint Chest Pain    HPI YOSMAR RYKER is a 70 y.o. male below listed past medical history presents to the ER for evaluation of 3 days of worsening shortness of breath fatigue and chest discomfort going on left shoulder.  Feels like he is having another heart attack.  Does have history of A. fib but is not currently taking any anticoagulation.  On review of the chart he does have what appears to be a history of noncompliance with supposed to be on metoprolol and amiodarone.  States he does not smoke does have a history of emphysema.  Denies any abdominal pain.  Has had worsening lower extremity swelling.    Past Medical History:  Diagnosis Date  . CHF (congestive heart failure) ([place])   . Chronic cough   . COPD (chronic obstructive pulmonary disease) ([place])   . Elevated liver function tests   . Emphysema of lung ([place])   . Fibrosis, pulmonary, interstitial, diffuse ([place])   . GERD (gastroesophageal reflux disease)   . History of cocaine abuse ([place])   . Mediastinal lymphadenopathy   . Pulmonary fibrosis ([place])   . Right inguinal hernia    Family History  Problem Relation Age of Onset  . Diabetes Mother   . Cancer Father   . AAA (abdominal aortic aneurysm) Brother    Past Surgical History:  Procedure Laterality Date  . COLONOSCOPY WITH PROPOFOL N/A 08/19/2018   Procedure: COLONOSCOPY WITH PROPOFOL;  Surgeon: Virgel Manifold, MD;  Location: [facility] ENDOSCOPY;  Service: Endoscopy;  Laterality: N/A;  . HERNIA REPAIR Right 1973   open  . INGUINAL HERNIA REPAIR Right 11/08/2014   Procedure: LAPAROSCOPIC RIGHT INGUINAL HERNIA REPAIR;  Surgeon: Hubbard Khamari Yousuf, MD;  Location: [facility] ORS;  Service: General;  Laterality: Right;  . knee  arthroscopy Right 1972  . RIGHT/LEFT HEART CATH AND CORONARY ANGIOGRAPHY Right 03/05/2017   Procedure: RIGHT/LEFT HEART CATH AND CORONARY ANGIOGRAPHY;  Surgeon: Dionisio David, MD;  Location: [facility];  Service: Cardiovascular;  Laterality: Right;   Patient Active Problem List   Diagnosis Date Noted  . Atrial fibrillation ([place]) 12/09/2019  . Atrial fibrillation with RVR ([place]) 12/08/2019  . Slurred speech 12/08/2019  . Chest pain 12/08/2019  . Elevated troponin 12/08/2019  . Acute CHF (congestive heart failure) ([place]) 08/05/2019  . Diarrhea   . Dizziness 12/17/2018  . Hypotension 12/17/2018  . Hypophosphatemia 12/17/2018  . Hypomagnesemia 12/17/2018  . Positive D-dimer 12/17/2018  . Abnormal LFTs (liver function tests) 12/17/2018  . Anemia 12/17/2018  . Pulmonary fibrosis ([place])   . GERD (gastroesophageal reflux disease)   . COPD (chronic obstructive pulmonary disease) ([place])   . CHF (congestive heart failure) ([place])   . Encounter for screening colonoscopy   . Polyp of transverse colon   . Internal hemorrhoids   . Hypokalemia 02/03/2018  . History of alcoholism ([place]) 12/08/2017  . Rheumatoid arthritis involving multiple sites with positive rheumatoid factor ([place]) 10/13/2017  . Chronic systolic CHF (congestive heart failure) ([place]) 03/12/2017  . AF (paroxysmal atrial fibrillation) ([place]) 03/12/2017  . Arthritis of left knee 08/01/2016  . 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 Chronic cough 05/29/2015  . CAFL (chronic airflow limitation) ([place]) 10/24/2014  .  Arteriosclerosis of coronary artery 10/24/2014  . History of fundoplication 50/93/2671  . HTN (hypertension) 10/24/2014  . LAD (lymphadenopathy), mediastinal 07/13/2013  . Interstitial lung disease ([place]) 06/19/2013  . 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    Social History Social  History   Tobacco Use  . Smoking status: Never Smoker  . Smokeless tobacco: Never Used  Vaping Use  . Vaping Use: Never used  Substance Use Topics  . Alcohol use: Not Currently    Alcohol/week: 0.0 standard drinks    Comment: 2 quarts a week, he quit again 12/2018  . Drug use: Not Currently    Types: Cocaine    Comment: as an young adult     Review of Systems Patient denies headaches, rhinorrhea, blurry vision, numbness, shortness of breath, chest pain, edema, cough, abdominal pain, nausea, vomiting, diarrhea, dysuria, fevers, rashes or hallucinations unless otherwise stated above in HPI. ____________________________________________   PHYSICAL EXAM:  VITAL SIGNS: Vitals:   04/18/20 0900 04/18/20 0930  BP: 93/81 104/75  Pulse: (!) 33 (!) 46  Resp: (!) 43 (!) 29  Temp:    SpO2: 100% 100%    Constitutional: Alert and oriented.  Eyes: Conjunctivae are normal.  Head: Atraumatic. Nose: No congestion/rhinnorhea. Mouth/Throat: Mucous membranes are moist.   Neck: No stridor. Painless ROM.  Cardiovascular: irregularly irregular rhythm. Grossly normal heart sounds.  Good peripheral circulation. Respiratory: Normal respiratory effort.  No retractions. Lungs CTAB. Gastrointestinal: Soft and nontender. No distention. No abdominal bruits. No CVA tenderness. Genitourinary: deferred Musculoskeletal: No lower extremity tenderness nor edema.  No joint effusions. Neurologic:  Normal speech and language. No gross focal neurologic deficits are appreciated. No facial droop Skin:  Skin is warm, dry and intact. No rash noted. Psychiatric: Mood and affect are normal. Speech and behavior are normal.  ____________________________________________   LABS (all labs ordered are listed, but only abnormal results are displayed)  Results for orders placed or performed during the hospital encounter of 04/18/20 (from the past 24 hour(s))  Basic metabolic panel     Status: Abnormal   Collection  Time: 04/18/20  8:32 AM  Result Value Ref Range   Sodium 132 (L) 135 - 145 mmol/L   Potassium 2.6 (LL) 3.5 - 5.1 mmol/L   Chloride 94 (L) 98 - 111 mmol/L   CO2 25 22 - 32 mmol/L   Glucose, Bld 112 (H) 70 - 99 mg/dL   BUN 18 8 - 23 mg/dL   Creatinine, Ser 0.88 0.61 - 1.24 mg/dL   Calcium 7.8 (L) 8.9 - 10.3 mg/dL   GFR, Estimated >60 >60 mL/min   Anion gap 13 5 - 15  CBC     Status: None   Collection Time: 04/18/20  8:32 AM  Result Value Ref Range   WBC 6.3 4.0 - 10.5 K/uL   RBC 4.86 4.22 - 5.81 MIL/uL   Hemoglobin 13.5 13.0 - 17.0 g/dL   HCT 39.4 39.0 - 52.0 %   MCV 81.1 80.0 - 100.0 fL   MCH 27.8 26.0 - 34.0 pg   MCHC 34.3 30.0 - 36.0 g/dL   RDW 14.6 11.5 - 15.5 %   Platelets 261 150 - 400 K/uL   nRBC 0.0 0.0 - 0.2 %  Troponin I (High Sensitivity)     Status: Abnormal   Collection Time: 04/18/20  8:32 AM  Result Value Ref Range  Troponin I (High Sensitivity) 18 (H) <18 ng/L  Hepatic function panel     Status: Abnormal   Collection Time: 04/18/20  8:32 AM  Result Value Ref Range   Total Protein 7.6 6.5 - 8.1 g/dL   Albumin 2.7 (L) 3.5 - 5.0 g/dL   AST 24 15 - 41 U/L   ALT 10 0 - 44 U/L   Alkaline Phosphatase 71 38 - 126 U/L   Total Bilirubin 1.9 (H) 0.3 - 1.2 mg/dL   Bilirubin, Direct 0.6 (H) 0.0 - 0.2 mg/dL   Indirect Bilirubin 1.3 (H) 0.3 - 0.9 mg/dL  Magnesium     Status: None   Collection Time: 04/18/20  8:43 AM  Result Value Ref Range   Magnesium 1.9 1.7 - 2.4 mg/dL   ____________________________________________  EKG My review and personal interpretation at Time: 8:29   Indication: chest pain  Rate: 155  Rhythm: afib with rvr Axis: left Other: non specific st abn likely rate dependent, no stemi ____________________________________________  RADIOLOGY  I personally reviewed all radiographic images ordered to evaluate for the above acute complaints and reviewed radiology reports and findings.  These findings were personally discussed with the patient.  Please  see medical record for radiology report.  ____________________________________________   PROCEDURES  Procedure(s) performed:  .Critical Care Performed by: Merlyn Lot, MD Authorized by: Merlyn Lot, MD   Critical care provider statement:    Critical care time (minutes):  35   Critical care time was exclusive of:  Separately billable procedures and treating other patients   Critical care was necessary to treat or prevent imminent or life-threatening deterioration of the following conditions:  Cardiac failure   Critical care was time spent personally by me on the following activities:  Development of treatment plan with patient or surrogate, discussions with consultants, evaluation of patient's response to treatment, examination of patient, obtaining history from patient or surrogate, ordering and performing treatments and interventions, ordering and review of laboratory studies, ordering and review of radiographic studies, pulse oximetry, re-evaluation of patient's condition and review of old charts      Critical Care performed: yes ____________________________________________   INITIAL IMPRESSION / [place] / ED COURSE  Pertinent labs & imaging results that were available during my care of the patient were reviewed by me and considered in my medical decision making (see chart for details).   DDX: Dysrhythmia, ACS, CHF, COPD, pneumonia, electrolyte abnormality, dehydration  TESHAUN OLARTE is a 70 y.o. who presents to the ED with presentation as described above.  Patient chronically ill-appearing arrives in A. fib with RVR.  Has known history of fibrosis not hypoxic.  EKG with nonspecific changes.  Suspect patient has been noncompliant given his A. fib with RVR and not taking anticoagulation would not be a candidate for electrical cardioversion at this time.  Will restart amiodarone infusion and load.  Will anticoagulate.  Will check blood work.  Review of  telemetry shows heart rate maintaining in the 130s in A. fib.  Clinical Course as of 04/18/20 1000  Thu Apr 18, 2020  [id number] Patient's heart rate is improving.  Remains 100% on room air.  Noted to be hypokalemic.  I am to heparinize as well given his discomfort will order IV and oral potassium as well as IV magnesium. [PR]  [phone] Pharyngitis hospital shortage on heparin therefore will Lovenox.  Heart rate still jumping around in the 120s but blood pressure stabilizing is receiving IV magnesium as well as potassium replenishment.  Patient states that he is not been taking his amiodarone or any medications for the past several days.  Will discuss with hospitalist for admission. [PR]    Clinical Course User Index [PR] Merlyn Lot, MD    The patient was evaluated in Emergency Department today for the symptoms described in the history of present illness. He/she was evaluated in the context of the global COVID-19 pandemic, which necessitated consideration that the patient might be at risk for infection with the SARS-CoV-2 virus that causes COVID-19. Institutional protocols and algorithms that pertain to the evaluation of patients at risk for COVID-19 are in a state of rapid change based on information released by regulatory bodies including the CDC and federal and state organizations. These policies and algorithms were followed during the patient's care in the ED.  As part of my medical decision making, I reviewed the following data within the [place] notes reviewed and incorporated, Labs reviewed, notes from prior ED visits and [place] Controlled Substance Database   ____________________________________________   FINAL CLINICAL IMPRESSION(S) / ED DIAGNOSES  Final diagnoses:  Atrial fibrillation with rapid ventricular response ([place])  Hypokalemia      NEW MEDICATIONS STARTED DURING THIS VISIT:  New Prescriptions   No medications on file     Note:  This document  was prepared using Dragon voice recognition software and may include unintentional dictation errors.    Merlyn Lot, MD 04/18/20 1000

## 2020-04-18 NOTE — Consult Note (Signed)
Jason Fitzgerald is a 70 y.o. male  [id number]  Primary Cardiologist: Neoma Laming Reason for Consultation: A fib with RVR  HPI: Patient is a 70 year old male with past medical history of combined HFrEF/HFpEF, NICM, COPD, PAF, and IPF presenting to the emergency room with worsening dyspnea and found to be in atrial fibrillation with RVR.  Patient was last hospitalized for similar situation in November 2021.  Patient states that he had stopped all his medications 2 months ago as they were making him feel ill and cause abdominal discomfort.  Patient has already been started on amiodarone infusion.  We have been consulted for atrial fibrillation management.   Review of Systems: Patient with complaints of continuing dyspnea currently on 3 L nasal cannula.  Denies chest pain or dizziness.   Past Medical History:  Diagnosis Date  . CHF (congestive heart failure) ([place])   . Chronic cough   . COPD (chronic obstructive pulmonary disease) ([place])   . Elevated liver function tests   . Emphysema of lung ([place])   . Fibrosis, pulmonary, interstitial, diffuse ([place])   . GERD (gastroesophageal reflux disease)   . History of cocaine abuse ([place])   . Mediastinal lymphadenopathy   . Pulmonary fibrosis ([place])   . Right inguinal hernia     No medications prior to admission.     Derrill Memo ON 04/19/2020] aspirin  81 mg Oral Daily  . atorvastatin  40 mg Oral Daily  . enoxaparin (LOVENOX) injection  1 mg/kg Subcutaneous Q12H  . folic acid  1 mg Oral Daily  . LORazepam  0-4 mg Intravenous Q6H   Followed by  . [START ON 04/20/2020] LORazepam  0-4 mg Intravenous Q12H  . metoprolol succinate  25 mg Oral Daily  . multivitamin with minerals  1 tablet Oral Daily  . thiamine  100 mg Oral Daily   Or  . thiamine  100 mg Intravenous Daily    Infusions: . amiodarone 60 mg/hr (04/18/20 1241)   Followed by  . amiodarone      No Known Allergies  Social History   Socioeconomic History  . Marital status:  Divorced    Spouse name: Not on file  . Number of children: 4  . Years of education: Not on file  . Highest education level: Not on file  Occupational History  . Occupation: retired   Tobacco Use  . Smoking status: Never Smoker  . Smokeless tobacco: Never Used  Vaping Use  . Vaping Use: Never used  Substance and Sexual Activity  . Alcohol use: Not Currently    Alcohol/week: 0.0 standard drinks    Comment: 2 quarts a week, he quit again 12/2018  . Drug use: Not Currently    Types: Cocaine    Comment: as an young adult   . Sexual activity: Not Currently  Other Topics Concern  . Not on file  Social History Narrative   Retired Summer of 2019   Lives alone   He had 4 children. One son was killed years ago, but has 3 living children    Social Determinants of Radio broadcast assistant Strain: Not on file  Food Insecurity: Not on file  Transportation Needs: Not on file  Physical Activity: Not on file  Stress: Not on file  Social Connections: Not on file  Intimate Partner Violence: Not on file    Family History  Problem Relation Age of Onset  . Diabetes Mother   . Cancer Father   .  AAA (abdominal aortic aneurysm) Brother     PHYSICAL EXAM: Vitals:   04/18/20 1204 04/18/20 1239  BP: 108/74 100/68  Pulse: (!) 120 94  Resp:  16  Temp:  97.7 F (36.5 C)  SpO2:  98%     Intake/Output Summary (Last 24 hours) at 04/18/2020 1306 Last data filed at 04/18/2020 1137 Gross per 24 hour  Intake 150 ml  Output -  Net 150 ml    General:  Well appearing. No respiratory difficulty HEENT: normal Neck: supple. no JVD. Carotids 2+ bilat; no bruits. No lymphadenopathy or thryomegaly appreciated. Cor: Atrial fibrillation. No rubs, gallops or murmurs. Lungs: Scattered rhonchi Abdomen: soft, nontender, nondistended. No hepatosplenomegaly. No bruits or masses. Good bowel sounds. Extremities: +2 pitting edema BLE Neuro: alert & oriented x 3, cranial nerves grossly intact. moves  all 4 extremities w/o difficulty. Affect pleasant.  ECG: Atrial fibrillation with RVR. 154/bpm  Results for orders placed or performed during the hospital encounter of 04/18/20 (from the past 24 hour(s))  Basic metabolic panel     Status: Abnormal   Collection Time: 04/18/20  8:32 AM  Result Value Ref Range   Sodium 132 (L) 135 - 145 mmol/L   Potassium 2.6 (LL) 3.5 - 5.1 mmol/L   Chloride 94 (L) 98 - 111 mmol/L   CO2 25 22 - 32 mmol/L   Glucose, Bld 112 (H) 70 - 99 mg/dL   BUN 18 8 - 23 mg/dL   Creatinine, Ser 0.88 0.61 - 1.24 mg/dL   Calcium 7.8 (L) 8.9 - 10.3 mg/dL   GFR, Estimated >60 >60 mL/min   Anion gap 13 5 - 15  CBC     Status: None   Collection Time: 04/18/20  8:32 AM  Result Value Ref Range   WBC 6.3 4.0 - 10.5 K/uL   RBC 4.86 4.22 - 5.81 MIL/uL   Hemoglobin 13.5 13.0 - 17.0 g/dL   HCT 39.4 39.0 - 52.0 %   MCV 81.1 80.0 - 100.0 fL   MCH 27.8 26.0 - 34.0 pg   MCHC 34.3 30.0 - 36.0 g/dL   RDW 14.6 11.5 - 15.5 %   Platelets 261 150 - 400 K/uL   nRBC 0.0 0.0 - 0.2 %  Troponin I (High Sensitivity)     Status: Abnormal   Collection Time: 04/18/20  8:32 AM  Result Value Ref Range   Troponin I (High Sensitivity) 18 (H) <18 ng/L  Hepatic function panel     Status: Abnormal   Collection Time: 04/18/20  8:32 AM  Result Value Ref Range   Total Protein 7.6 6.5 - 8.1 g/dL   Albumin 2.7 (L) 3.5 - 5.0 g/dL   AST 24 15 - 41 U/L   ALT 10 0 - 44 U/L   Alkaline Phosphatase 71 38 - 126 U/L   Total Bilirubin 1.9 (H) 0.3 - 1.2 mg/dL   Bilirubin, Direct 0.6 (H) 0.0 - 0.2 mg/dL   Indirect Bilirubin 1.3 (H) 0.3 - 0.9 mg/dL  Magnesium     Status: None   Collection Time: 04/18/20  8:43 AM  Result Value Ref Range   Magnesium 1.9 1.7 - 2.4 mg/dL  Brain natriuretic peptide     Status: Abnormal   Collection Time: 04/18/20  8:43 AM  Result Value Ref Range   B Natriuretic Peptide 832.7 (H) 0.0 - 100.0 pg/mL  APTT     Status: None   Collection Time: 04/18/20  8:43 AM  Result Value Ref  Range  aPTT 32 24 - 36 seconds  Heparin level (unfractionated)     Status: Abnormal   Collection Time: 04/18/20  8:43 AM  Result Value Ref Range   Heparin Unfractionated <0.10 (L) 0.30 - 0.70 IU/mL  Protime-INR     Status: Abnormal   Collection Time: 04/18/20  8:43 AM  Result Value Ref Range   Prothrombin Time 16.6 (H) 11.4 - 15.2 seconds   INR 1.4 (H) 0.8 - 1.2  Resp Panel by RT-PCR (Flu A&B, Covid) Nasopharyngeal Swab     Status: None   Collection Time: 04/18/20  8:44 AM   Specimen: Nasopharyngeal Swab; Nasopharyngeal(NP) swabs in vial transport medium  Result Value Ref Range   SARS Coronavirus 2 by RT PCR NEGATIVE NEGATIVE   Influenza A by PCR NEGATIVE NEGATIVE   Influenza B by PCR NEGATIVE NEGATIVE  TSH     Status: None   Collection Time: 04/18/20 10:39 AM  Result Value Ref Range   TSH 3.963 0.350 - 4.500 uIU/mL   DG Chest Port 1 View  Result Date: 04/18/2020 CLINICAL DATA:  Chest pain EXAM: PORTABLE CHEST 1 VIEW COMPARISON:  December 08, 2019 chest radiograph; chest CT December 09, 2019 FINDINGS: There is extensive fibrosis throughout the lungs, most severely in the mid and lower lung regions. There are small pleural effusions bilaterally. The heart size is within normal limits. Pulmonary vascularity is within normal limits. Lymph node prominence noted on prior CT is not well delineated radiography. No bone lesions. IMPRESSION: Extensive underlying fibrosis with small pleural effusions bilaterally. No appreciable new opacity compared to prior studies. Stable cardiac silhouette. Electronically Signed   By: Lowella Grip III M.D.   On: 04/18/2020 09:04     ASSESSMENT AND PLAN: Patient presenting to the emergency department with worsening dyspnea and found to have atrial fibrillation with RVR.  Patient admits to being noncompliant with medications over the last 2 months.  Patient remains in atrial fibrillation at this time, please continue amiodarone infusion and will add metoprolol  succinate 25 mg daily for rate control.  Patient previously on Xarelto, for now please initiate heparin infusion with plan to discharge on Xarelto again.  For history of combined HFrEF/HFpEF and most recent ejection fraction of 20 to 25%, will repeat echocardiogram.  We will continue to follow  Adaline Sill

## 2020-04-18 NOTE — H&P (Signed)
History and Physical    BRYLON BRENNING MVE:[id number] DOB: 1950-05-09 DOA: 04/18/2020  PCP: Steele Sizer, MD   Patient coming from: Home  I have personally briefly reviewed patient's old medical records in [place]  Chief Complaint: Chest pain  HPI: ULYSSES ALPER is a 70 y.o. male with medical history significant for hypertension, hyperlipidemia, COPD, GERD, interstitial lung disease, pulmonary fibrosis, sCHF with EF of 25 to 30%, atrial fibrillation, rheumatoid arthritis, alcohol abuse, who presents to the emergency room via private vehicle for evaluation of chest pain which he has had for 3 days.  Chest pain is mostly over the left anterior chest wall with radiation to the upper back and patient states that he feels like his prior heart attack.  He rated his pain a 7 x 10 in intensity at its worst and it is associated with palpitations and shortness of breath.   He denied having any diaphoresis, no nausea, no vomiting, no headache, no fever, no chills, no urinary frequency, no nocturia, no dysuria, no cough, no headache, no blurred vision, no focal deficits. Labs show sodium 132, potassium 2.6, chloride 94, bicarb 25, glucose 112, BUN 18, creatinine 0.8, calcium 7.8, magnesium 1.9, alkaline phosphatase 71, albumin 2.7, AST 24, ALT 10, total protein 7.6, total bilirubin 1.9, BNP 832, white count 6.3, hemoglobin 13.5, hematocrit 39.4, MCV 81.1, RDW 14.6, platelet count 261, PT 16.6, INR 1.4 Respiratory viral panel still pending Chest x-ray reviewed by me shows extensive underlying fibrosis with small pleural effusions bilaterally. No appreciable new opacity compared to prior studies. Stable cardiac silhouette Twelve-lead EKG shows atrial fibrillation with a rapid ventricular rate.   ED Course: Patient is a 70 year old African-American male who presents to the ER for evaluation of chest pain associated with shortness of breath or palpitations.  He was noted to be tachycardic and   EKG confirmed rapid atrial fibrillation.  Patient has been noncompliant with prescribed medications because according to the patient to make him feel bad.  He had relative hypotension and was started on amiodarone drip for rate control.  He also received therapeutic Lovenox.  He will be admitted to the hospital for further evaluation.  Review of Systems: As per HPI otherwise all other systems reviewed and negative.    Past Medical History:  Diagnosis Date  . CHF (congestive heart failure) ([place])   . Chronic cough   . COPD (chronic obstructive pulmonary disease) ([place])   . Elevated liver function tests   . Emphysema of lung ([place])   . Fibrosis, pulmonary, interstitial, diffuse ([place])   . GERD (gastroesophageal reflux disease)   . History of cocaine abuse ([place])   . Mediastinal lymphadenopathy   . Pulmonary fibrosis ([place])   . Right inguinal hernia     Past Surgical History:  Procedure Laterality Date  . COLONOSCOPY WITH PROPOFOL N/A 08/19/2018   Procedure: COLONOSCOPY WITH PROPOFOL;  Surgeon: Virgel Manifold, MD;  Location: [facility] ENDOSCOPY;  Service: Endoscopy;  Laterality: N/A;  . HERNIA REPAIR Right 1973   open  . INGUINAL HERNIA REPAIR Right 11/08/2014   Procedure: LAPAROSCOPIC RIGHT INGUINAL HERNIA REPAIR;  Surgeon: Hubbard Robinson, MD;  Location: [facility] ORS;  Service: General;  Laterality: Right;  . knee arthroscopy Right 1972  . RIGHT/LEFT HEART CATH AND CORONARY ANGIOGRAPHY Right 03/05/2017   Procedure: RIGHT/LEFT HEART CATH AND CORONARY ANGIOGRAPHY;  Surgeon: Dionisio David, MD;  Location: [facility];  Service: Cardiovascular;  Laterality: Right;     reports  that he has never smoked. He has never used smokeless tobacco. He reports previous alcohol use. He reports previous drug use. Drug: Cocaine.  No Known Allergies  Family History  Problem Relation Age of Onset  . Diabetes Mother   . Cancer Father   . AAA (abdominal aortic aneurysm) Brother       Prior to  Admission medications   Not on File    Physical Exam: Vitals:   04/18/20 0845 04/18/20 0900 04/18/20 0930 04/18/20 0943  BP: (!) 113/95 93/81 104/75   Pulse: (!) 129 (!) 33 (!) 46   Resp: (!) 22 (!) 43 (!) 29   Temp:      TempSrc:      SpO2: 100% 100% 100%   Weight:    77.6 kg     Vitals:   04/18/20 0845 04/18/20 0900 04/18/20 0930 04/18/20 0943  BP: (!) 113/95 93/81 104/75   Pulse: (!) 129 (!) 33 (!) 46   Resp: (!) 22 (!) 43 (!) 29   Temp:      TempSrc:      SpO2: 100% 100% 100%   Weight:    77.6 kg      Constitutional: Alert and oriented x 3 . Not in any apparent distress HEENT:      Head: Normocephalic and atraumatic.         Eyes: PERLA, EOMI, Conjunctivae are normal. Sclera is non-icteric.       Mouth/Throat: Mucous membranes are moist.       Neck: Supple with no signs of meningismus. Cardiovascular:  Irregularly irregular. No murmurs, gallops, or rubs. 2+ symmetrical distal pulses are present . No JVD. 2+ LE edema Respiratory: Respiratory effort normal .Lungs sounds clear bilaterally. No wheezes, crackles, or rhonchi.  Gastrointestinal: Soft, non tender, and non distended with positive bowel sounds.  Genitourinary: No CVA tenderness. Musculoskeletal: Nontender with normal range of motion in all extremities. No cyanosis, or erythema of extremities. Neurologic:  Face is symmetric. Moving all extremities. No gross focal neurologic deficits  Skin: Skin is warm, dry.  No rash or ulcers Psychiatric: Mood and affect are normal   Labs on Admission: I have personally reviewed following labs and imaging studies  CBC: Recent Labs  Lab 04/18/20 0832  WBC 6.3  HGB 13.5  HCT 39.4  MCV 81.1  PLT 341   Basic Metabolic Panel: Recent Labs  Lab 04/18/20 0832 04/18/20 0843  NA 132*  --   K 2.6*  --   CL 94*  --   CO2 25  --   GLUCOSE 112*  --   BUN 18  --   CREATININE 0.88  --   CALCIUM 7.8*  --   MG  --  1.9   GFR: Estimated Creatinine Clearance: 80.3  mL/min (by C-G formula based on SCr of 0.88 mg/dL). Liver Function Tests: Recent Labs  Lab 04/18/20 0832  AST 24  ALT 10  ALKPHOS 71  BILITOT 1.9*  PROT 7.6  ALBUMIN 2.7*   No results for input(s): LIPASE, AMYLASE in the last 168 hours. No results for input(s): AMMONIA in the last 168 hours. Coagulation Profile: Recent Labs  Lab 04/18/20 0843  INR 1.4*   Cardiac Enzymes: No results for input(s): CKTOTAL, CKMB, CKMBINDEX, TROPONINI in the last 168 hours. BNP (last 3 results) No results for input(s): PROBNP in the last 8760 hours. HbA1C: No results for input(s): HGBA1C in the last 72 hours. CBG: No results for input(s): GLUCAP in the last 168  hours. Lipid Profile: No results for input(s): CHOL, HDL, LDLCALC, TRIG, CHOLHDL, LDLDIRECT in the last 72 hours. Thyroid Function Tests: No results for input(s): TSH, T4TOTAL, FREET4, T3FREE, THYROIDAB in the last 72 hours. Anemia Panel: No results for input(s): VITAMINB12, FOLATE, FERRITIN, TIBC, IRON, RETICCTPCT in the last 72 hours. Urine analysis:    Component Value Date/Time   COLORURINE AMBER (A) 12/17/2018 1437   APPEARANCEUR HAZY (A) 12/17/2018 1437   LABSPEC 1.014 12/17/2018 1437   PHURINE 6.0 12/17/2018 1437   GLUCOSEU NEGATIVE 12/17/2018 1437   HGBUR NEGATIVE 12/17/2018 1437   BILIRUBINUR NEGATIVE 12/17/2018 1437   BILIRUBINUR Negative 12/08/2017 0947   KETONESUR 20 (A) 12/17/2018 1437   PROTEINUR 30 (A) 12/17/2018 1437   UROBILINOGEN 1.0 12/08/2017 0947   NITRITE NEGATIVE 12/17/2018 1437   LEUKOCYTESUR NEGATIVE 12/17/2018 1437    Radiological Exams on Admission: DG Chest Port 1 View  Result Date: 04/18/2020 CLINICAL DATA:  Chest pain EXAM: PORTABLE CHEST 1 VIEW COMPARISON:  December 08, 2019 chest radiograph; chest CT December 09, 2019 FINDINGS: There is extensive fibrosis throughout the lungs, most severely in the mid and lower lung regions. There are small pleural effusions bilaterally. The heart size is within  normal limits. Pulmonary vascularity is within normal limits. Lymph node prominence noted on prior CT is not well delineated radiography. No bone lesions. IMPRESSION: Extensive underlying fibrosis with small pleural effusions bilaterally. No appreciable new opacity compared to prior studies. Stable cardiac silhouette. Electronically Signed   By: Lowella Grip III M.D.   On: 04/18/2020 09:04     Assessment/Plan Principal Problem:   Atrial fibrillation with rapid ventricular response (HCC) Active Problems:   Chronic systolic CHF (congestive heart failure) (HCC)   History of alcoholism (HCC)   Hypokalemia   Fibrosis, pulmonary, interstitial, diffuse (HCC)   Hypotension   Non compliance w medication regimen      Atrial fibrillation with rapid ventricular response Patient has a history of atrial fibrillation but is noncompliant with prescribed medications He was started on amiodarone drip in the emergency room due to relative hypotension with improvement in his heart rate Patient will be weaned off amiodarone drip and due to his history of pulmonary fibrosis will need another agent for rate control Will defer to cardiology Patient has a CHADS2VASC score of 3 and ideally requires long-term anticoagulation as primary prophylaxis for an acute stroke Continue therapeutic Lovenox     Hypokalemia Unclear etiology Supplement potassium Check magnesium levels   Chronic systolic heart failure Last known LVEF 25 to 30% Patient has been noncompliant with prescribed medications Maintain low-sodium diet Hold diuretic therapy for now due to relative hypotension   Pulmonary fibrosis Stable Continue as needed bronchodilator therapy   History of alcoholism Patient denies recent alcohol use We will place patient on lorazepam and administer for CIWA score of 8 or greater    Chest pain We will cycle cardiac enzymes Place patient on aspirin and statins Patient not on beta-blockers  due to relative hypotension  DVT prophylaxis: Lovenox Code Status: full code Family Communication: Greater than 50% of time was spent discussing plan of care with patient at the bedside.  All questions and concerns have been addressed.  He verbalizes understanding and agrees with the plan. Disposition Plan: Back to previous home environment Consults called: Cardiology Status: At the time of admission, it appears that the appropriate admission status for this patient is inpatient.  This is judged to be reasonable and necessary in order to provide the  required intensity of service to ensure the patient's safety given the presenting symptoms, physical exam findings and initial radiographic and laboratory data in the context of their comorbid conditions. Patient requires inpatient status due to high intensity of service, high risk for further deterioration and high frequency of surveillance required.    Collier Bullock MD [facility]     04/18/2020, 10:59 AM

## 2020-04-19 DIAGNOSIS — I482 Chronic atrial fibrillation, unspecified: Secondary | ICD-10-CM

## 2020-04-19 DIAGNOSIS — I4581 Long QT syndrome: Secondary | ICD-10-CM

## 2020-04-19 DIAGNOSIS — Z7189 Other specified counseling: Secondary | ICD-10-CM

## 2020-04-19 DIAGNOSIS — Z515 Encounter for palliative care: Secondary | ICD-10-CM

## 2020-04-19 LAB — BASIC METABOLIC PANEL
Anion gap: 21 — ABNORMAL HIGH (ref 5–15)
BUN: 25 mg/dL — ABNORMAL HIGH (ref 8–23)
CO2: 19 mmol/L — ABNORMAL LOW (ref 22–32)
Calcium: 8.2 mg/dL — ABNORMAL LOW (ref 8.9–10.3)
Chloride: 94 mmol/L — ABNORMAL LOW (ref 98–111)
Creatinine, Ser: 2.12 mg/dL — ABNORMAL HIGH (ref 0.61–1.24)
GFR, Estimated: 33 mL/min — ABNORMAL LOW (ref >60)
Glucose, Bld: 136 mg/dL — ABNORMAL HIGH (ref 70–99)
Potassium: 3.9 mmol/L (ref 3.5–5.1)
Sodium: 134 mmol/L — ABNORMAL LOW (ref 135–145)

## 2020-04-19 LAB — CBC
HCT: 39.8 % (ref 39.0–52.0)
Hemoglobin: 13.1 g/dL (ref 13.0–17.0)
MCH: 27.6 pg (ref 26.0–34.0)
MCHC: 32.9 g/dL (ref 30.0–36.0)
MCV: 84 fL (ref 80.0–100.0)
Platelets: 254 10*3/uL (ref 150–400)
RBC: 4.74 MIL/uL (ref 4.22–5.81)
RDW: 14.8 % (ref 11.5–15.5)
WBC: 9.9 10*3/uL (ref 4.0–10.5)
nRBC: 0 % (ref 0.0–0.2)

## 2020-04-19 LAB — ECHOCARDIOGRAM COMPLETE
AR max vel: 1.41 cm2
AV Area VTI: 1.85 cm2
AV Area mean vel: 1.72 cm2
AV Mean grad: 1 mmHg
AV Peak grad: 1.7 mmHg
Ao pk vel: 0.66 m/s
Area-P 1/2: 5.16 cm2
Calc EF: 18.1 %
S' Lateral: 5.01 cm
Single Plane A2C EF: 20.9 %
Single Plane A4C EF: 20.6 %
Weight: 2736 oz

## 2020-04-19 LAB — LIPID PANEL
Cholesterol: 126 mg/dL (ref 0–200)
HDL: 30 mg/dL — ABNORMAL LOW (ref >40)
LDL Cholesterol: 82 mg/dL (ref 0–99)
Total CHOL/HDL Ratio: 4.2 RATIO
Triglycerides: 69 mg/dL (ref <150)
VLDL: 14 mg/dL (ref 0–40)

## 2020-04-19 MED ORDER — METOPROLOL SUCCINATE ER 50 MG PO TB24: 50.0000 mg | ORAL_TABLET | Freq: Every day | ORAL | Status: DC

## 2020-04-19 MED ORDER — MORPHINE SULFATE (PF) 2 MG/ML IV SOLN: 2.0000 mg | Freq: Once | INTRAVENOUS | Status: DC

## 2020-04-19 MED ORDER — METOPROLOL SUCCINATE ER 25 MG PO TB24
25.0000 mg | ORAL_TABLET | Freq: Every day | ORAL | Status: DC
  Administered 2020-04-20 – 2020-04-22 (×3): 25 mg via ORAL
  Filled 2020-04-19 (×3): qty 1

## 2020-04-19 NOTE — Consult Note (Signed)
Consultation Note Date: 04/19/2020   Patient Name: Jason Fitzgerald  DOB: 1950/11/27  MRN: [id number]  Age / Sex: 70 y.o., male  PCP: Jason Sizer, MD Referring Physician: Debbe Odea, MD  Reason for Consultation: Establishing goals of care  HPI/Patient Profile: 70 y.o. male  with past medical history of hypertension, hyperlipidemia, COPD, GERD, pulmonary fibrosis,sCHF with EF of 25 to 30%, atrial fibrillation, rheumatoid arthritis, and alcohol abuse admitted on 04/18/2020 with 3 days of chest pain. Admits to stopping all meds for several months. Found to be in a fib RVR.  Chest x-ray reveals extensive fibrosis and small bilateral pleural effusions. Found to have EF < 20%. Also with AKI - creatinine at 2.12 up from 0.88 - possibly r/t poor cardiac output. At risk for withdrawal symptoms - drinks daily. PMT consulted to discuss [place].   Clinical Assessment and Goals of Care: I have reviewed medical records including EPIC notes, labs and imaging, received report from RN, assessed the patient and then met with patient  to discuss diagnosis prognosis, GOC, EOL wishes, disposition and options.  Jason Fitzgerald is initially reluctant to talk to me but then does offer some information.   I introduced Palliative Medicine as specialized medical care for people living with serious illness. It focuses on providing relief from the symptoms and stress of a serious illness. The goal is to improve quality of life for both the patient and the family.  Tells me he lives home alone but this has not been going well and he needs some extra assistance. Tells me he has been getting weaker and had a very poor appetite.    We discussed patient's current illness and what it means in the larger context of patient's on-going co-morbidities.  Natural disease trajectory and expectations at EOL were discussed. We discuss his weak heart, bad lung disease, and kidney injury. Discuss  how they all affect each other - make each other worse. Discuss his decreased function and appetite likely r/t worsening chronic disease.  I attempted to elicit values and goals of care important to the patient.  He tells me he just wants to be at home - not interested in any sort of placement following hospitalization.  The difference between aggressive medical intervention and comfort care was considered in light of the patient's goals of care. Advance directives, concepts specific to code status, artificial feeding and hydration, and rehospitalization were considered and discussed. Jason Fitzgerald is unsure about his wishes - unsure about CPR or ventilator - tells me he will think about it. Encouraged patient to consider DNR/DNI status understanding evidenced based poor outcomes in similar hospitalized patients, as the cause of the arrest is likely associated with chronic/terminal disease rather than a reversible acute cardio-pulmonary event.  He is unsure if he wants to come back to the hospital.  Discussed with patient the importance of continued conversation with family and the medical providers regarding overall plan of care and treatment options, ensuring decisions are within the context of the patient's values and GOCs.    Hospice and Palliative Care services outpatient were explained and offered. He is definitely open to palliative care follow up but when I explain that he may even be eligible for hospice support at home d/t his advanced diseases he is open to that as well. We discuss type of support provided but hospice and philosophy of hospice. He is agreeable to hearing more information from hospice team. Discussed with hospice liaison.   Jason Fitzgerald also shares that  if he were ever unable to make decisions for himself he would want his sister Jason Fitzgerald to make decisions for him.   Questions and concerns were addressed. The family was encouraged to call with questions or concerns.   Primary  Decision Maker PATIENT    SUMMARY OF RECOMMENDATIONS   - patient considering going home with hospice - would like more info - hospice liaison aware - patient request time to consider code status - DNR/DNI recommended - sister Jason Fitzgerald to make medical decisions if he is unable - Jason Axe, NP to follow up Jason Fitzgerald:  Full code  Prognosis:   Likely eligible for hospice services  Discharge Planning: To Be Determined -  Considering home with hospice, understands hospice philosophy of care     Primary Diagnoses: Present on Admission: . Atrial fibrillation with rapid ventricular response ([place]) . Chronic systolic CHF (congestive heart failure) ([place]) . Hypokalemia . Hypotension . Fibrosis, pulmonary, interstitial, diffuse ([place])   I have reviewed the medical record, interviewed the patient and family, and examined the patient. The following aspects are pertinent.  Past Medical History:  Diagnosis Date  . CHF (congestive heart failure) ([place])   . Chronic cough   . COPD (chronic obstructive pulmonary disease) ([place])   . Elevated liver function tests   . Emphysema of lung ([place])   . Fibrosis, pulmonary, interstitial, diffuse ([place])   . GERD (gastroesophageal reflux disease)   . History of cocaine abuse ([place])   . Mediastinal lymphadenopathy   . Pulmonary fibrosis ([place])   . Right inguinal hernia    Social History   Socioeconomic History  . Marital status: Divorced    Spouse name: Not on file  . Number of children: 4  . Years of education: Not on file  . Highest education level: Not on file  Occupational History  . Occupation: retired   Tobacco Use  . Smoking status: Never Smoker  . Smokeless tobacco: Never Used  Vaping Use  . Vaping Use: Never used  Substance and Sexual Activity  . Alcohol use: Not Currently    Alcohol/week: 0.0 standard drinks    Comment: 2 quarts a week, he quit again 12/2018  . Drug use: Not Currently    Types: Cocaine     Comment: as an young adult   . Sexual activity: Not Currently  Other Topics Concern  . Not on file  Social History Narrative   Retired Summer of 2019   Lives alone   He had 4 children. One son was killed years ago, but has 3 living children    Social Determinants of Radio broadcast assistant Strain: Not on file  Food Insecurity: Not on file  Transportation Needs: Not on file  Physical Activity: Not on file  Stress: Not on file  Social Connections: Not on file   Family History  Problem Relation Age of Onset  . Diabetes Mother   . Cancer Father   . AAA (abdominal aortic aneurysm) Brother    Scheduled Meds: . aspirin  81 mg Oral Daily  . atorvastatin  40 mg Oral Daily  . enoxaparin (LOVENOX) injection  1 mg/kg Subcutaneous Q12H  . folic acid  1 mg Oral Daily  . LORazepam  0-4 mg Intravenous Q6H   Followed by  . [START ON 04/20/2020] LORazepam  0-4 mg Intravenous Q12H  . metoprolol succinate  50 mg Oral Daily  .  morphine injection  2 mg Intravenous Once  .  multivitamin with minerals  1 tablet Oral Daily  . thiamine  100 mg Oral Daily   Or  . thiamine  100 mg Intravenous Daily   Continuous Infusions: PRN Meds:.acetaminophen, LORazepam **OR** LORazepam, ondansetron (ZOFRAN) IV No Known Allergies Review of Systems  Constitutional: Positive for activity change, appetite change and fatigue.  Neurological: Positive for weakness.    Physical Exam Constitutional:      General: He is not in acute distress. Pulmonary:     Effort: Pulmonary effort is normal.  Musculoskeletal:     Right lower leg: Edema present.     Left lower leg: Edema present.  Skin:    General: Skin is warm and dry.  Neurological:     Mental Status: He is alert and oriented to person, place, and time.  Psychiatric:     Comments: withdrawn     Vital Signs: BP 93/68 (BP Location: Left Arm)   Pulse 90   Temp 97.8 F (36.6 C) (Oral)   Resp 18   Ht _0  (1.753 m)   Wt 77.6 kg   SpO2 100%    BMI 25.25 kg/m  Pain Scale: 0-10   Pain Score: Asleep   SpO2: SpO2: 100 % O2 Device:SpO2: 100 % O2 Flow Rate: .O2 Flow Rate (L/min): 2 L/min  IO: Intake/output summary:   Intake/Output Summary (Last 24 hours) at 04/19/2020 1613 Last data filed at 04/19/2020 0956 Gross per 24 hour  Intake 224.15 ml  Output 50 ml  Net 174.15 ml    LBM:   Baseline Weight: Weight: 77.6 kg Most recent weight: Weight: 77.6 kg     Palliative Assessment/Data: PPS 40%    Time Total: 40 minutes Greater than 50%  of this time was spent counseling and coordinating care related to the above assessment and plan.  Juel Burrow, DNP, AGNP-C Palliative Medicine Team [phone] Pager: [phone]

## 2020-04-19 NOTE — Progress Notes (Addendum)
Pt belongs at bedside: cellphone with no charger, clothing and glasses.   Update: CCMD called and reported that pt have a 5 beat non-sustain vtach HR up to 140s for 3 seconds. Pt not on any distress on assessment. VSS. NP Ouma made aware but no new order was place. Will continue to monitor.

## 2020-04-19 NOTE — Progress Notes (Signed)
Mobility Specialist - Progress Note   04/19/20 1500  Mobility  Activity Ambulated in room  Range of Motion/Exercises Right leg;Left leg (AP)  Level of Assistance Minimal assist, patient does 75% or more  Assistive Device Front wheel walker  Distance Ambulated (ft) 15 ft  Mobility Response Tolerated well  Mobility performed by Mobility specialist  $Mobility charge 1 Mobility    Pre-mobility: 73 HR, 96% SpO2 During mobility: 77 HR, 100% SpO2 Post-mobility: 72 HR, 99% SpO2   Pt ambulated in room on 2L. C/o weakness and generalized pain "everywhere" upon arrival, but did specify chest pain later. Pt reached EOB with minA. Mild dizziness upon sitting that did resolve. Pt stood and ambulated in room with RW. Very slow pace and short stride. Extra time needed to manage pain and tolerance. Several short rest breaks taken. Pt returned EOB SOB, PLB educated and utilized. When asked, pt did express that the hardest part of today's session was breathing. Pt c/o chest tightness once returned supine. RN notified.    Kathee Delton Mobility Specialist 04/19/20, 3:24 PM

## 2020-04-19 NOTE — Progress Notes (Signed)
Bladder scanned patient by NT Abigail with no urine found. Patient reports he had bowel movement last night and may have urinated at that time. Will continue to monitor for urine output.

## 2020-04-19 NOTE — Progress Notes (Addendum)
PROGRESS NOTE    Jason Fitzgerald   EGB:[id number]  DOB: 08-Mar-1950  DOA: 04/18/2020 PCP: Steele Sizer, MD   Brief Narrative:  Jason Fitzgerald  is a 70 y.o. male with medical history significant for hypertension, hyperlipidemia, rheumatoid arthritis, COPD, GERD, interstitial lung disease, pulmonary fibrosis,sCHF with EF of 25 to 30%, atrial fibrillation, rheumatoid arthritis, alcohol abuse, who presents to the emergency room via private vehicle for evaluation of chest pain which he has had for 3 days and states it feels like his prior heart attack.  Admitted to stopping all of his medications many months ago because they made him feel that.  In the ED he is found to be in A. fib with RVR.  Chest x-ray reveals extensive fibrosis and small bilateral pleural effusions. Sodium 132 Potassium 2.6 WBC count 6.3 BNP 32   Subjective: States back hurts. Feels better once he sits up at the side of the bed. Does not answer me when asked if he plans to take his prescribed medications.     Assessment & Plan:   Principal Problem:   Atrial fibrillation with rapid ventricular response  -Previously on metoprolol, amiodarone and Xarelto but stopped these -Appreciate cardiology eval -He has been on an amiodarone infusion since being admitted order to normal sinus rhythm -Aa he already has severe pulmonary fibrosis, I will not be placing him on oral amiodarone but instead will continue Toprol which is cardioselective and titrate the dose up slowly -We will also request a palliative care consult for goals of care  Active Problems:   Chronic systolic CHF (congestive heart failure) -Previous EF 25 to 30%-- --Repeat 2D echo obtained yesterday reveals an EF that is less than 20% with global hypokinesis and severe dilatation, grade 2 diastolic dysfunction, RV, moderately enlarged, bilateral atria severely enlarged, severe mitral regurgitation. -Compensated at this time -Resume Entresto and  furosemide  AKI with metabolic acidosis - Cr is up to 2.12 from 0.88- he has not received any offending agents - may be due to RVR and poor cardiac output yesterday- follow    History of alcoholism  -Continue Ativan/CIWA scale- he states he drinks daily    Hypokalemia -Replaced    Fibrosis, pulmonary, interstitial, diffuse  -Follows with Dr. Raul Del    Non compliance w medication regimen   Time spent in minutes: 35 DVT prophylaxis: Lovenox  Code Status: Full code Family Communication:  With his sister Level of Care: Level of care: Progressive Cardiac Disposition Plan:  Status is: Inpatient  Remains inpatient appropriate because:Inpatient level of care appropriate due to severity of illness   Dispo: The patient is from: Home              Anticipated d/c is to: TBD              Patient currently is not medically stable to d/c.   Difficult to place patient No      Consultants:   Palliative care Procedures:   none Antimicrobials:  Anti-infectives (From admission, onward)   None       Objective: Vitals:   04/19/20 0438 04/19/20 0439 04/19/20 0604 04/19/20 0724  BP: (!) 77/56 91/67 (!) 83/57 (!) 89/59  Pulse: 74 76  78  Resp: 20 20  17   Temp: 97.6 F (36.4 C) 97.6 F (36.4 C)  97.9 F (36.6 C)  TempSrc: Oral Oral  Oral  SpO2: 99% 99%  100%  Weight:      Height:  Intake/Output Summary (Last 24 hours) at 04/19/2020 0951 Last data filed at 04/18/2020 1514 Gross per 24 hour  Intake 399.57 ml  Output -  Net 399.57 ml   Filed Weights   04/18/20 0943  Weight: 77.6 kg    Examination: General exam: Appears comfortable  HEENT: PERRLA, oral mucosa moist, no sclera icterus or thrush Respiratory system: Clear to auscultation. Respiratory effort normal. Cardiovascular system: S1 & S2 heard, RRR.   Gastrointestinal system: Abdomen soft, non-tender, nondistended. Normal bowel sounds. Central nervous system: Alert and oriented. No focal neurological  deficits. Extremities: No cyanosis, clubbing or edema Skin: No rashes or ulcers Psychiatry:  Mood & affect appropriate.     Data Reviewed: I have personally reviewed following labs and imaging studies  CBC: Recent Labs  Lab 04/18/20 0832 04/19/20 0429  WBC 6.3 9.9  HGB 13.5 13.1  HCT 39.4 39.8  MCV 81.1 84.0  PLT 261 625   Basic Metabolic Panel: Recent Labs  Lab 04/18/20 0832 04/18/20 0843 04/18/20 1551 04/19/20 0429  NA 132*  --  134* 134*  K 2.6*  --  3.2* 3.9  CL 94*  --  95* 94*  CO2 25  --  24 19*  GLUCOSE 112*  --  158* 136*  BUN 18  --  20 25*  CREATININE 0.88  --  1.17 2.12*  CALCIUM 7.8*  --  7.8* 8.2*  MG  --  1.9  --   --    GFR: Estimated Creatinine Clearance: 33.3 mL/min (A) (by C-G formula based on SCr of 2.12 mg/dL (H)). Liver Function Tests: Recent Labs  Lab 04/18/20 0832  AST 24  ALT 10  ALKPHOS 71  BILITOT 1.9*  PROT 7.6  ALBUMIN 2.7*   No results for input(s): LIPASE, AMYLASE in the last 168 hours. No results for input(s): AMMONIA in the last 168 hours. Coagulation Profile: Recent Labs  Lab 04/18/20 0843  INR 1.4*   Cardiac Enzymes: No results for input(s): CKTOTAL, CKMB, CKMBINDEX, TROPONINI in the last 168 hours. BNP (last 3 results) No results for input(s): PROBNP in the last 8760 hours. HbA1C: No results for input(s): HGBA1C in the last 72 hours. CBG: No results for input(s): GLUCAP in the last 168 hours. Lipid Profile: Recent Labs    04/19/20 0429  CHOL 126  HDL 30*  LDLCALC 82  TRIG 69  CHOLHDL 4.2   Thyroid Function Tests: Recent Labs    04/18/20 1039  TSH 3.963   Anemia Panel: No results for input(s): VITAMINB12, FOLATE, FERRITIN, TIBC, IRON, RETICCTPCT in the last 72 hours. Urine analysis:    Component Value Date/Time   COLORURINE AMBER (A) 12/17/2018 1437   APPEARANCEUR HAZY (A) 12/17/2018 1437   LABSPEC 1.014 12/17/2018 1437   PHURINE 6.0 12/17/2018 1437   GLUCOSEU NEGATIVE 12/17/2018 1437    HGBUR NEGATIVE 12/17/2018 1437   BILIRUBINUR NEGATIVE 12/17/2018 1437   BILIRUBINUR Negative 12/08/2017 0947   KETONESUR 20 (A) 12/17/2018 1437   PROTEINUR 30 (A) 12/17/2018 1437   UROBILINOGEN 1.0 12/08/2017 0947   NITRITE NEGATIVE 12/17/2018 1437   LEUKOCYTESUR NEGATIVE 12/17/2018 1437   Sepsis Labs: @LABRCNTIP (procalcitonin:4,lacticidven:4) ) Recent Results (from the past 240 hour(s))  Resp Panel by RT-PCR (Flu A&B, Covid) Nasopharyngeal Swab     Status: None   Collection Time: 04/18/20  8:44 AM   Specimen: Nasopharyngeal Swab; Nasopharyngeal(NP) swabs in vial transport medium  Result Value Ref Range Status   SARS Coronavirus 2 by RT PCR NEGATIVE NEGATIVE Final  Comment: (NOTE) SARS-CoV-2 target nucleic acids are NOT DETECTED.  The SARS-CoV-2 RNA is generally detectable in upper respiratory specimens during the acute phase of infection. The lowest concentration of SARS-CoV-2 viral copies this assay can detect is 138 copies/mL. A negative result does not preclude SARS-Cov-2 infection and should not be used as the sole basis for treatment or other patient management decisions. A negative result may occur with  improper specimen collection/handling, submission of specimen other than nasopharyngeal swab, presence of viral mutation(s) within the areas targeted by this assay, and inadequate number of viral copies(<138 copies/mL). A negative result must be combined with clinical observations, patient history, and epidemiological information. The expected result is Negative.  Fact Sheet for Patients:  EntrepreneurPulse.com.au  Fact Sheet for Healthcare Providers:  IncredibleEmployment.be  This test is no t yet approved or cleared by the [place] FDA and  has been authorized for detection and/or diagnosis of SARS-CoV-2 by FDA under an Emergency Use Authorization (EUA). This EUA will remain  in effect (meaning this test can be used) for  the duration of the COVID-19 declaration under Section 564(b)(1) of the Act, 21 U.S.C.section 360bbb-3(b)(1), unless the authorization is terminated  or revoked sooner.       Influenza A by PCR NEGATIVE NEGATIVE Final   Influenza B by PCR NEGATIVE NEGATIVE Final    Comment: (NOTE) The Xpert Xpress SARS-CoV-2/FLU/RSV plus assay is intended as an aid in the diagnosis of influenza from Nasopharyngeal swab specimens and should not be used as a sole basis for treatment. Nasal washings and aspirates are unacceptable for Xpert Xpress SARS-CoV-2/FLU/RSV testing.  Fact Sheet for Patients: EntrepreneurPulse.com.au  Fact Sheet for Healthcare Providers: IncredibleEmployment.be  This test is not yet approved or cleared by the [place] FDA and has been authorized for detection and/or diagnosis of SARS-CoV-2 by FDA under an Emergency Use Authorization (EUA). This EUA will remain in effect (meaning this test can be used) for the duration of the COVID-19 declaration under Section 564(b)(1) of the Act, 21 U.S.C. section 360bbb-3(b)(1), unless the authorization is terminated or revoked.  Performed at [facility], [address], [place], [place] [address]          Radiology Studies: DG Chest Port 1 View  Result Date: 04/18/2020 CLINICAL DATA:  Chest pain EXAM: PORTABLE CHEST 1 VIEW COMPARISON:  December 08, 2019 chest radiograph; chest CT December 09, 2019 FINDINGS: There is extensive fibrosis throughout the lungs, most severely in the mid and lower lung regions. There are small pleural effusions bilaterally. The heart size is within normal limits. Pulmonary vascularity is within normal limits. Lymph node prominence noted on prior CT is not well delineated radiography. No bone lesions. IMPRESSION: Extensive underlying fibrosis with small pleural effusions bilaterally. No appreciable new opacity compared to prior studies. Stable cardiac  silhouette. Electronically Signed   By: Lowella Grip III M.D.   On: 04/18/2020 09:04   ECHOCARDIOGRAM COMPLETE  Result Date: 04/19/2020    ECHOCARDIOGRAM REPORT   Patient Name:   Jason Fitzgerald Date of Exam: 04/18/2020 Medical Rec #:  [id number]       Height:       69.0 in Accession #:    [id number]      Weight:       171.0 lb Date of Birth:  08-16-1950      BSA:          1.933 m Patient Age:    74 years  BP:           100/68 mmHg Patient Gender: M               HR:           94 bpm. Exam Location:  [facility] Procedure: 2D Echo, Cardiac Doppler and Color Doppler Indications:     CHF-acute systolic X93.71  History:         Patient has prior history of Echocardiogram examinations, most                  recent 12/09/2019. CHF; COPD.  Sonographer:     Sherrie Sport RDCS (AE) Referring Phys:  [id number] [place] Diagnosing Phys: Neoma Laming MD  Sonographer Comments: Suboptimal parasternal window. IMPRESSIONS  1. Left ventricular ejection fraction, by estimation, is <20%. The left ventricle has severely decreased function. The left ventricle demonstrates global hypokinesis. The left ventricular internal cavity size was severely dilated. There is mild concentric left ventricular hypertrophy. Left ventricular diastolic parameters are consistent with Grade II diastolic dysfunction (pseudonormalization).  2. Right ventricular systolic function is mildly reduced. The right ventricular size is moderately enlarged.  3. Left atrial size was severely dilated.  4. Right atrial size was severely dilated.  5. 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 No evidence of mitral stenosis.  6. The aortic valve is grossly normal. Aortic valve regurgitation is not visualized. Mild aortic valve sclerosis is present, with no evidence of aortic valve stenosis. FINDINGS  Left Ventricle: Left ventricular ejection fraction, by estimation, is <20%. The left ventricle has severely decreased function. The left  ventricle demonstrates global hypokinesis. The left ventricular internal cavity size was severely dilated. There is mild concentric left ventricular hypertrophy. Left ventricular diastolic parameters are consistent with Grade II diastolic dysfunction (pseudonormalization). Right Ventricle: The right ventricular size is moderately enlarged. No increase in right ventricular wall thickness. Right ventricular systolic function is mildly reduced. Left Atrium: Left atrial size was severely dilated. Right Atrium: Right atrial size was severely dilated. Pericardium: There is no evidence of pericardial effusion. Mitral Valve: 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 No evidence of mitral valve stenosis. Tricuspid Valve: The tricuspid valve is grossly normal. Tricuspid valve regurgitation is mild. Aortic Valve: The aortic valve is grossly normal. Aortic valve regurgitation is not visualized. Mild aortic valve sclerosis is present, with no evidence of aortic valve stenosis. Aortic valve mean gradient measures 1.0 mmHg. Aortic valve peak gradient measures 1.7 mmHg. Aortic valve area, by VTI measures 1.85 cm. Pulmonic Valve: The pulmonic valve was normal in structure. Pulmonic valve regurgitation is trivial. Aorta: The aortic root, ascending aorta and aortic arch are all structurally normal, with no evidence of dilitation or obstruction. IAS/Shunts: The interatrial septum was not assessed.  LEFT VENTRICLE PLAX 2D LVIDd:         5.57 cm LVIDs:         5.01 cm LV PW:         0.90 cm LV IVS:        0.78 cm LVOT diam:     2.10 cm LV SV:         13 LV SV Index:   7 LVOT Area:     3.46 cm  LV Volumes (MOD) LV vol d, MOD A2C: 115.0 ml LV vol d, MOD A4C: 194.0 ml LV vol s, MOD A2C: 91.0 ml LV vol s, MOD A4C: 154.0 ml LV SV MOD A2C:  24.0 ml LV SV MOD A4C:     194.0 ml LV SV MOD BP:      27.8 ml RIGHT VENTRICLE RV S prime:     9.79 cm/s TAPSE (M-mode): 3.7 cm LEFT ATRIUM             Index       RIGHT ATRIUM            Index LA diam:        4.30 cm 2.22 cm/m  RA Area:     16.10 cm LA Vol (A2C):   84.0 ml 43.46 ml/m RA Volume:   43.50 ml  22.50 ml/m LA Vol (A4C):   56.0 ml 28.97 ml/m LA Biplane Vol: 69.5 ml 35.96 ml/m  AORTIC VALVE                   PULMONIC VALVE AV Area (Vmax):    1.41 cm    PV Vmax:        0.18 m/s AV Area (Vmean):   1.72 cm    PV Peak grad:   0.1 mmHg AV Area (VTI):     1.85 cm    RVOT Peak grad: 1 mmHg AV Vmax:           66.00 cm/s AV Vmean:          42.200 cm/s AV VTI:            0.072 m AV Peak Grad:      1.7 mmHg AV Mean Grad:      1.0 mmHg LVOT Vmax:         26.80 cm/s LVOT Vmean:        21.000 cm/s LVOT VTI:          0.039 m LVOT/AV VTI ratio: 0.54  AORTA Ao Root diam: 2.83 cm MITRAL VALVE                TRICUSPID VALVE MV Area (PHT): 5.16 cm     TR Peak grad:   32.7 mmHg MV Decel Time: 147 msec     TR Vmax:        286.00 cm/s MV E velocity: 118.00 cm/s                             SHUNTS                             Systemic VTI:  0.04 m                             Systemic Diam: 2.10 cm Neoma Laming MD Electronically signed by Neoma Laming MD Signature Date/Time: 04/19/2020/8:09:07 AM    Final       Scheduled Meds: . aspirin  81 mg Oral Daily  . atorvastatin  40 mg Oral Daily  . enoxaparin (LOVENOX) injection  1 mg/kg Subcutaneous Q12H  . folic acid  1 mg Oral Daily  . LORazepam  0-4 mg Intravenous Q6H   Followed by  . [START ON 04/20/2020] LORazepam  0-4 mg Intravenous Q12H  . metoprolol succinate  25 mg Oral Daily  .  morphine injection  2 mg Intravenous Once  . multivitamin with minerals  1 tablet Oral Daily  . thiamine  100 mg Oral Daily   Or  . thiamine  100 mg Intravenous  Daily   Continuous Infusions: . amiodarone 30 mg/hr (04/18/20 2321)     LOS: 1 day      Debbe Odea, MD [facility] Pager: [email or web address] 04/19/2020, 9:51 AM

## 2020-04-19 NOTE — Progress Notes (Signed)
Patient complained of chest pain. MD notified, no new orders at this time. EKG completed and placed in patient's chart

## 2020-04-19 NOTE — TOC Initial Note (Signed)
Transition of Care [facility]) - Initial/Assessment Note    Patient Details  Name: Jason Fitzgerald MRN: [id number] Date of Birth: [birth year]  [facility] of [facility]) CM/SW Contact:    Eileen Stanford, LCSW Phone Number: 04/19/2020, 10:42 AM  Clinical Narrative:   CSW met with pt at bedside. Pt was very sleepy and continued to fall asleep during conversation. Pt did state he lives alone, his family takes him to his medical appointments, and [facility] [place]. Pt confirmed he still sees Dr Ruthine Dose for his PCP. When inquired about services pt states he didn't have any prior to admission and he states "I am not ready for that" when addressed about HH at d/c. Pt continued to fall asleep. CSW will return at a later time to address SA. CSW following for dispo.                Expected Discharge Plan: [place] (undetermined at this time) Barriers to Discharge: Continued Medical Work up   Patient Goals and CMS Choice Patient states their goals for this hospitalization and ongoing recovery are:: to get bettter      Expected Discharge Plan and Services Expected Discharge Plan: [place] (undetermined at this time) In-house Referral: Clinical Social Work   Post Acute Care Choice:  (undetermined at this time) Living arrangements for the past 2 months: [place]                                      Prior Living Arrangements/Services Living arrangements for the past 2 months: [place] with:: Self Patient language and need for interpreter reviewed:: Yes Do you feel safe going back to the place where you live?: Yes      Need for Family Participation in Patient Care: Yes (Comment) Care giver support system in place?: Yes (comment)   Criminal Activity/Legal Involvement Pertinent to Current Situation/Hospitalization: No - Comment as needed  Activities of Daily Living Home Assistive Devices/Equipment: None ADL Screening  (condition at time of admission) Patient's cognitive ability adequate to safely complete daily activities?: Yes Is the patient deaf or have difficulty hearing?: No Does the patient have difficulty seeing, even when wearing glasses/contacts?: No Does the patient have difficulty concentrating, remembering, or making decisions?: No Patient able to express need for assistance with ADLs?: Yes Does the patient have difficulty dressing or bathing?: No Independently performs ADLs?: Yes (appropriate for developmental age) Does the patient have difficulty walking or climbing stairs?: No Weakness of Legs: None Weakness of Arms/Hands: None  Permission Sought/Granted      Share Information with NAME: gladys     Permission granted to share info w Relationship: sister     Emotional Assessment Appearance:: Appears stated age Attitude/Demeanor/Rapport: Lethargic Affect (typically observed): Calm,Flat Orientation: : Oriented to Self,Oriented to  Time,Oriented to Place,Oriented to Situation Alcohol / Substance Use: Not Applicable Psych Involvement: No (comment)  Admission diagnosis:  Hypokalemia [E87.6] SOB (shortness of breath) [R06.02] Chronic atrial fibrillation (HCC) [I48.20] Atrial fibrillation with rapid ventricular response ([place]) [I48.91] Patient Active Problem List   Diagnosis Date Noted  . Atrial fibrillation with rapid ventricular response ([place]) 04/18/2020  . Non compliance w medication regimen   . Atrial fibrillation ([place]) 12/09/2019  . Atrial fibrillation with RVR ([place]) 12/08/2019  . Slurred speech 12/08/2019  . Chest pain 12/08/2019  . Elevated troponin  12/08/2019  . Acute CHF (congestive heart failure) ([place]) 08/05/2019  . Diarrhea   . Dizziness 12/17/2018  . Hypotension 12/17/2018  . Hypophosphatemia 12/17/2018  . Hypomagnesemia 12/17/2018  . Positive D-dimer 12/17/2018  . Abnormal LFTs (liver function tests) 12/17/2018  . Anemia 12/17/2018  . Fibrosis, pulmonary,  interstitial, diffuse ([place])   . GERD (gastroesophageal reflux disease)   . COPD (chronic obstructive pulmonary disease) ([place])   . CHF (congestive heart failure) ([place])   . Encounter for screening colonoscopy   . Polyp of transverse colon   . Internal hemorrhoids   . Hypokalemia 02/03/2018  . History of alcoholism ([place]) 12/08/2017  . Rheumatoid arthritis involving multiple sites with positive rheumatoid factor ([place]) 10/13/2017  . Chronic systolic CHF (congestive heart failure) ([place]) 03/12/2017  . AF (paroxysmal atrial fibrillation) ([place]) 03/12/2017  . Arthritis of left knee 08/01/2016  . 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 Chronic cough 05/29/2015  . CAFL (chronic airflow limitation) ([place]) 10/24/2014  . Arteriosclerosis of coronary artery 10/24/2014  . History of fundoplication 56/38/9373  . HTN (hypertension) 10/24/2014  . LAD (lymphadenopathy), mediastinal 07/13/2013  . Interstitial lung disease ([place]) 06/19/2013  . 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   PCP:  Steele Sizer, MD Pharmacy:   [id number] [place] [id number]: [phone]: [phone]    Social Determinants of Health (SDOH) Interventions    Readmission Risk Interventions Readmission Risk Prevention Plan 12/12/2019 12/10/2019  Transportation Screening Complete Complete  PCP or Specialist Appt within 3-5 Days Complete Complete  [place] or [place] Complete  Social Work Consult for [place]/Counseling Complete Complete  Palliative Care Screening Complete Not Applicable  Medication Review (Press photographer Complete Complete  Some recent data might be hidden

## 2020-04-19 NOTE — Progress Notes (Signed)
SUBJECTIVE: Feeling much better less short of breath   Vitals:   04/19/20 0438 04/19/20 0439 04/19/20 0604 04/19/20 0724  BP: (!) 77/56 91/67 (!) 83/57 (!) 89/59  Pulse: 74 76  78  Resp: 20 20  17   Temp: 97.6 F (36.4 C) 97.6 F (36.4 C)  97.9 F (36.6 C)  TempSrc: Oral Oral  Oral  SpO2: 99% 99%  100%  Weight:      Height:        Intake/Output Summary (Last 24 hours) at 04/19/2020 0752 Last data filed at 04/18/2020 1514 Gross per 24 hour  Intake 399.57 ml  Output --  Net 399.57 ml    LABS: Basic Metabolic Panel: Recent Labs    04/18/20 0843 04/18/20 1551 04/19/20 0429  NA  --  134* 134*  K  --  3.2* 3.9  CL  --  95* 94*  CO2  --  24 19*  GLUCOSE  --  158* 136*  BUN  --  20 25*  CREATININE  --  1.17 2.12*  CALCIUM  --  7.8* 8.2*  MG 1.9  --   --    Liver Function Tests: Recent Labs    04/18/20 0832  AST 24  ALT 10  ALKPHOS 71  BILITOT 1.9*  PROT 7.6  ALBUMIN 2.7*   No results for input(s): LIPASE, AMYLASE in the last 72 hours. CBC: Recent Labs    04/18/20 0832 04/19/20 0429  WBC 6.3 9.9  HGB 13.5 13.1  HCT 39.4 39.8  MCV 81.1 84.0  PLT 261 254   Cardiac Enzymes: No results for input(s): CKTOTAL, CKMB, CKMBINDEX, TROPONINI in the last 72 hours. BNP: Invalid input(s): POCBNP D-Dimer: No results for input(s): DDIMER in the last 72 hours. Hemoglobin A1C: No results for input(s): HGBA1C in the last 72 hours. Fasting Lipid Panel: Recent Labs    04/19/20 0429  CHOL 126  HDL 30*  LDLCALC 82  TRIG 69  CHOLHDL 4.2   Thyroid Function Tests: Recent Labs    04/18/20 1039  TSH 3.963   Anemia Panel: No results for input(s): VITAMINB12, FOLATE, FERRITIN, TIBC, IRON, RETICCTPCT in the last 72 hours.   PHYSICAL EXAM General: Well developed, well nourished, in no acute distress HEENT:  Normocephalic and atramatic Neck:  No JVD.  Lungs: Clear bilaterally to auscultation and percussion. Heart: HRRR . Normal S1 and S2 without gallops or  murmurs.  Abdomen: Bowel sounds are positive, abdomen soft and non-tender  Msk:  Back normal, normal gait. Normal strength and tone for age. Extremities: No clubbing, cyanosis or edema.   Neuro: Alert and oriented X 3. Psych:  Good affect, responds appropriately  TELEMETRY: Sinus rhythm  ASSESSMENT AND PLAN: Atrial fibrillation with rapid ventricular response rate and HFrEF, feeling much better on amiodarone drip.  Patient has converted to sinus rhythm 80 bpm with first-degree AV block advised changing to p.o. amiodarone and plan discharge tomorrow.  Principal Problem:   Atrial fibrillation with rapid ventricular response (HCC) Active Problems:   Chronic systolic CHF (congestive heart failure) (HCC)   History of alcoholism (HCC)   Hypokalemia   Fibrosis, pulmonary, interstitial, diffuse (HCC)   Hypotension   Non compliance w medication regimen    Neoma Laming A, MD, [facility] 04/19/2020 7:52 AM

## 2020-04-19 NOTE — Progress Notes (Signed)
MD Rizwan notified this RN via secure chat to administer patient 25mg  dose of Metoprolol at this time.

## 2020-04-20 ENCOUNTER — Inpatient Hospital Stay: Payer: Medicare Other

## 2020-04-20 LAB — BASIC METABOLIC PANEL
Anion gap: 16 — ABNORMAL HIGH (ref 5–15)
BUN: 35 mg/dL — ABNORMAL HIGH (ref 8–23)
CO2: 23 mmol/L (ref 22–32)
Calcium: 8.1 mg/dL — ABNORMAL LOW (ref 8.9–10.3)
Chloride: 93 mmol/L — ABNORMAL LOW (ref 98–111)
Creatinine, Ser: 2.54 mg/dL — ABNORMAL HIGH (ref 0.61–1.24)
GFR, Estimated: 27 mL/min — ABNORMAL LOW (ref >60)
Glucose, Bld: 124 mg/dL — ABNORMAL HIGH (ref 70–99)
Potassium: 3.8 mmol/L (ref 3.5–5.1)
Sodium: 132 mmol/L — ABNORMAL LOW (ref 135–145)

## 2020-04-20 MED ORDER — ENOXAPARIN SODIUM 80 MG/0.8ML ~~LOC~~ SOLN
1.0000 mg/kg | SUBCUTANEOUS | Status: DC
  Administered 2020-04-20: 80 mg via SUBCUTANEOUS
  Filled 2020-04-20: qty 0.8

## 2020-04-20 NOTE — Progress Notes (Signed)
   04/20/20 0116  Assess: MEWS Score  Temp 98 F (36.7 C)  BP 91/78  ECG Heart Rate 86  Resp (!) 25  SpO2 100 %  O2 Device Nasal Cannula  O2 Flow Rate (L/min) 1 L/min  Assess: MEWS Score  MEWS Temp 0  MEWS Systolic 1  MEWS Pulse 0  MEWS RR 1  MEWS LOC 0  MEWS Score 2  MEWS Score Color Yellow  Assess: if the MEWS score is Yellow or Red  Were vital signs taken at a resting state? Yes  Focused Assessment Change from prior assessment (see assessment flowsheet)  Early Detection of Sepsis Score *See Row Information* Low  MEWS guidelines implemented *See Row Information* Yes  Treat  MEWS Interventions Administered prn meds/treatments  Take Vital Signs  Increase Vital Sign Frequency  Yellow: Q 2hr X 2 then Q 4hr X 2, if remains yellow, continue Q 4hrs  Escalate  MEWS: Escalate Yellow: discuss with charge nurse/RN and consider discussing with provider and RRT  Notify: Charge Nurse/RN  Name of Charge Nurse/RN Notified Doretha Imus, RN  Date Charge Nurse/RN Notified 04/20/20  Time Charge Nurse/RN Notified 0150

## 2020-04-20 NOTE — Progress Notes (Signed)
PROGRESS NOTE    OZIAH VITANZA   GYI:[id number]  DOB: 1950/02/07  DOA: 04/18/2020 PCP: Steele Sizer, MD   Brief Narrative:  Jason Fitzgerald  is a 70 y.o. male with medical history significant for hypertension, hyperlipidemia, rheumatoid arthritis, COPD, GERD, interstitial lung disease, pulmonary fibrosis,sCHF with EF of 25 to 30%, atrial fibrillation, rheumatoid arthritis, alcohol abuse, who presents to the emergency room via private vehicle for evaluation of chest pain which he has had for 3 days and states it feels like his prior heart attack.  Admitted to stopping all of his medications many months ago because they made him feel that.  In the ED he is found to be in A. fib with RVR.  Chest x-ray reveals extensive fibrosis and small bilateral pleural effusions. Sodium 132 Potassium 2.6 WBC count 6.3 BNP 32   Subjective: He feels a little tired today. No other complaints but wants to go home.     Assessment & Plan:   Principal Problem:   Paroxysmal atrial fibrillation with rapid ventricular response  -Previously on metoprolol, amiodarone and Xarelto but stopped these -Appreciate cardiology eval -He has been on an amiodarone infusion since being admitted order to normal sinus rhythm -Aa he already has severe pulmonary fibrosis, I will not be placing him on oral amiodarone but instead will continue Toprol which is cardioselective and titrate the dose up slowly if possible   Active Problems:   Chronic systolic CHF (congestive heart failure) -Previous EF 25 to 30%-- --Repeat 2D echo obtained yesterday reveals an EF that is less than 20% with global hypokinesis and severe dilatation, grade 2 diastolic dysfunction, RV, moderately enlarged, bilateral atria severely enlarged, severe mitral regurgitation. -Compensated at this time - holding Entresto and furosemide as Cr has risen  AKI with metabolic acidosis - Cr   3.50>0.93> 2.12 > 2.54 - he has not received any offending  agents - may be due to RVR and poor cardiac output  - bladder scan and renal ultrasound ordered    History of alcoholism  -Continue Ativan/CIWA scale- he states he drinks daily- he has received one dose of Ativan so far at 340 this AM    Hypokalemia -Replaced    Fibrosis, pulmonary, interstitial, diffuse  -Follows with Dr. Raul Del    Non compliance w medication regimen  Disposition> - appreciate palliative care discussion- no final decision made in regards to home with hospice vs palliative care at home. The patient wants more information from the hospice team and the hospice liaison has been contacted. He remains a full code. I recommend DNR and hospice care.   Time spent in minutes: 35 DVT prophylaxis: Lovenox Code Status: Full code Family Communication:  With his sister, Regino Schultze, who he makes decisions with Level of Care: Level of care: Progressive Cardiac Disposition Plan:  Status is: Inpatient  Remains inpatient appropriate because:Inpatient level of care appropriate due to severity of illness   Dispo: The patient is from: Home              Anticipated d/c is to: TBD              Patient currently is not medically stable to d/c.   Difficult to place patient No      Consultants:   Palliative care Procedures:   none Antimicrobials:  Anti-infectives (From admission, onward)   None       Objective: Vitals:   04/20/20 0335 04/20/20 0521 04/20/20 0600 04/20/20 0850  BP: 102/73 100/84 100/84  123/79  Pulse: 92 87 87 77  Resp:  18  16  Temp:  98 F (36.7 C)  98.1 F (36.7 C)  TempSrc:    Oral  SpO2:  100%  100%  Weight:  79.8 kg    Height:        Intake/Output Summary (Last 24 hours) at 04/20/2020 1105 Last data filed at 04/20/2020 0700 Gross per 24 hour  Intake --  Output 110 ml  Net -110 ml   Filed Weights   04/18/20 0943 04/20/20 0521  Weight: 77.6 kg 79.8 kg    Examination: General exam: Appears comfortable  HEENT: PERRLA, oral mucosa moist,  no sclera icterus or thrush Respiratory system: faint crackles at bases. Respiratory effort normal. Cardiovascular system: S1 & S2 heard, regular rate and rhythm Gastrointestinal system: Abdomen soft, non-tender, nondistended. Normal bowel sounds   Central nervous system: Alert and oriented. No focal neurological deficits. Extremities: No cyanosis, clubbing or edema Skin: No rashes or ulcers Psychiatry:  flat affect     Data Reviewed: I have personally reviewed following labs and imaging studies  CBC: Recent Labs  Lab 04/18/20 0832 04/19/20 0429  WBC 6.3 9.9  HGB 13.5 13.1  HCT 39.4 39.8  MCV 81.1 84.0  PLT 261 748   Basic Metabolic Panel: Recent Labs  Lab 04/18/20 0832 04/18/20 0843 04/18/20 1551 04/19/20 0429 04/20/20 0409  NA 132*  --  134* 134* 132*  K 2.6*  --  3.2* 3.9 3.8  CL 94*  --  95* 94* 93*  CO2 25  --  24 19* 23  GLUCOSE 112*  --  158* 136* 124*  BUN 18  --  20 25* 35*  CREATININE 0.88  --  1.17 2.12* 2.54*  CALCIUM 7.8*  --  7.8* 8.2* 8.1*  MG  --  1.9  --   --   --    GFR: Estimated Creatinine Clearance: 27.8 mL/min (A) (by C-G formula based on SCr of 2.54 mg/dL (H)). Liver Function Tests: Recent Labs  Lab 04/18/20 0832  AST 24  ALT 10  ALKPHOS 71  BILITOT 1.9*  PROT 7.6  ALBUMIN 2.7*   No results for input(s): LIPASE, AMYLASE in the last 168 hours. No results for input(s): AMMONIA in the last 168 hours. Coagulation Profile: Recent Labs  Lab 04/18/20 0843  INR 1.4*   Cardiac Enzymes: No results for input(s): CKTOTAL, CKMB, CKMBINDEX, TROPONINI in the last 168 hours. BNP (last 3 results) No results for input(s): PROBNP in the last 8760 hours. HbA1C: No results for input(s): HGBA1C in the last 72 hours. CBG: No results for input(s): GLUCAP in the last 168 hours. Lipid Profile: Recent Labs    04/19/20 0429  CHOL 126  HDL 30*  LDLCALC 82  TRIG 69  CHOLHDL 4.2   Thyroid Function Tests: Recent Labs    04/18/20 1039  TSH  3.963   Anemia Panel: No results for input(s): VITAMINB12, FOLATE, FERRITIN, TIBC, IRON, RETICCTPCT in the last 72 hours. Urine analysis:    Component Value Date/Time   COLORURINE AMBER (A) 12/17/2018 1437   APPEARANCEUR HAZY (A) 12/17/2018 1437   LABSPEC 1.014 12/17/2018 1437   PHURINE 6.0 12/17/2018 1437   GLUCOSEU NEGATIVE 12/17/2018 1437   HGBUR NEGATIVE 12/17/2018 1437   BILIRUBINUR NEGATIVE 12/17/2018 1437   BILIRUBINUR Negative 12/08/2017 0947   KETONESUR 20 (A) 12/17/2018 1437   PROTEINUR 30 (A) 12/17/2018 1437   UROBILINOGEN 1.0 12/08/2017 0947   NITRITE NEGATIVE 12/17/2018 1437  LEUKOCYTESUR NEGATIVE 12/17/2018 1437   Sepsis Labs: @LABRCNTIP (procalcitonin:4,lacticidven:4) ) Recent Results (from the past 240 hour(s))  Resp Panel by RT-PCR (Flu A&B, Covid) Nasopharyngeal Swab     Status: None   Collection Time: 04/18/20  8:44 AM   Specimen: Nasopharyngeal Swab; Nasopharyngeal(NP) swabs in vial transport medium  Result Value Ref Range Status   SARS Coronavirus 2 by RT PCR NEGATIVE NEGATIVE Final    Comment: (NOTE) SARS-CoV-2 target nucleic acids are NOT DETECTED.  The SARS-CoV-2 RNA is generally detectable in upper respiratory specimens during the acute phase of infection. The lowest concentration of SARS-CoV-2 viral copies this assay can detect is 138 copies/mL. A negative result does not preclude SARS-Cov-2 infection and should not be used as the sole basis for treatment or other patient management decisions. A negative result may occur with  improper specimen collection/handling, submission of specimen other than nasopharyngeal swab, presence of viral mutation(s) within the areas targeted by this assay, and inadequate number of viral copies(<138 copies/mL). A negative result must be combined with clinical observations, patient history, and epidemiological information. The expected result is Negative.  Fact Sheet for Patients:   EntrepreneurPulse.com.au  Fact Sheet for Healthcare Providers:  IncredibleEmployment.be  This test is no t yet approved or cleared by the [place] FDA and  has been authorized for detection and/or diagnosis of SARS-CoV-2 by FDA under an Emergency Use Authorization (EUA). This EUA will remain  in effect (meaning this test can be used) for the duration of the COVID-19 declaration under Section 564(b)(1) of the Act, 21 U.S.C.section 360bbb-3(b)(1), unless the authorization is terminated  or revoked sooner.       Influenza A by PCR NEGATIVE NEGATIVE Final   Influenza B by PCR NEGATIVE NEGATIVE Final    Comment: (NOTE) The Xpert Xpress SARS-CoV-2/FLU/RSV plus assay is intended as an aid in the diagnosis of influenza from Nasopharyngeal swab specimens and should not be used as a sole basis for treatment. Nasal washings and aspirates are unacceptable for Xpert Xpress SARS-CoV-2/FLU/RSV testing.  Fact Sheet for Patients: EntrepreneurPulse.com.au  Fact Sheet for Healthcare Providers: IncredibleEmployment.be  This test is not yet approved or cleared by the [place] FDA and has been authorized for detection and/or diagnosis of SARS-CoV-2 by FDA under an Emergency Use Authorization (EUA). This EUA will remain in effect (meaning this test can be used) for the duration of the COVID-19 declaration under Section 564(b)(1) of the Act, 21 U.S.C. section 360bbb-3(b)(1), unless the authorization is terminated or revoked.  Performed at [facility], [address] [place], [place] [address]          Radiology Studies: ECHOCARDIOGRAM COMPLETE  Result Date: 04/19/2020    ECHOCARDIOGRAM REPORT   Patient Name:   HADRIEL NORTHUP Date of Exam: 04/18/2020 Medical Rec #:  [id number]       Height:       69.0 in Accession #:    [id number]      Weight:       171.0 lb Date of Birth:  05-12-1950      BSA:           1.933 m Patient Age:    46 years        BP:           100/68 mmHg Patient Gender: M               HR:           94 bpm. Exam Location:  [place]  Procedure: 2D Echo, Cardiac Doppler and Color Doppler Indications:     CHF-acute systolic T01.60  History:         Patient has prior history of Echocardiogram examinations, most                  recent 12/09/2019. CHF; COPD.  Sonographer:     Sherrie Sport RDCS (AE) Referring Phys:  [id number] [place] Diagnosing Phys: Neoma Laming MD  Sonographer Comments: Suboptimal parasternal window. IMPRESSIONS  1. Left ventricular ejection fraction, by estimation, is <20%. The left ventricle has severely decreased function. The left ventricle demonstrates global hypokinesis. The left ventricular internal cavity size was severely dilated. There is mild concentric left ventricular hypertrophy. Left ventricular diastolic parameters are consistent with Grade II diastolic dysfunction (pseudonormalization).  2. Right ventricular systolic function is mildly reduced. The right ventricular size is moderately enlarged.  3. Left atrial size was severely dilated.  4. Right atrial size was severely dilated.  5. 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 No evidence of mitral stenosis.  6. The aortic valve is grossly normal. Aortic valve regurgitation is not visualized. Mild aortic valve sclerosis is present, with no evidence of aortic valve stenosis. FINDINGS  Left Ventricle: Left ventricular ejection fraction, by estimation, is <20%. The left ventricle has severely decreased function. The left ventricle demonstrates global hypokinesis. The left ventricular internal cavity size was severely dilated. There is mild concentric left ventricular hypertrophy. Left ventricular diastolic parameters are consistent with Grade II diastolic dysfunction (pseudonormalization). Right Ventricle: The right ventricular size is moderately enlarged. No increase in right ventricular wall  thickness. Right ventricular systolic function is mildly reduced. Left Atrium: Left atrial size was severely dilated. Right Atrium: Right atrial size was severely dilated. Pericardium: There is no evidence of pericardial effusion. Mitral Valve: 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 No evidence of mitral valve stenosis. Tricuspid Valve: The tricuspid valve is grossly normal. Tricuspid valve regurgitation is mild. Aortic Valve: The aortic valve is grossly normal. Aortic valve regurgitation is not visualized. Mild aortic valve sclerosis is present, with no evidence of aortic valve stenosis. Aortic valve mean gradient measures 1.0 mmHg. Aortic valve peak gradient measures 1.7 mmHg. Aortic valve area, by VTI measures 1.85 cm. Pulmonic Valve: The pulmonic valve was normal in structure. Pulmonic valve regurgitation is trivial. Aorta: The aortic root, ascending aorta and aortic arch are all structurally normal, with no evidence of dilitation or obstruction. IAS/Shunts: The interatrial septum was not assessed.  LEFT VENTRICLE PLAX 2D LVIDd:         5.57 cm LVIDs:         5.01 cm LV PW:         0.90 cm LV IVS:        0.78 cm LVOT diam:     2.10 cm LV SV:         13 LV SV Index:   7 LVOT Area:     3.46 cm  LV Volumes (MOD) LV vol d, MOD A2C: 115.0 ml LV vol d, MOD A4C: 194.0 ml LV vol s, MOD A2C: 91.0 ml LV vol s, MOD A4C: 154.0 ml LV SV MOD A2C:     24.0 ml LV SV MOD A4C:     194.0 ml LV SV MOD BP:      27.8 ml RIGHT VENTRICLE RV S prime:     9.79 cm/s TAPSE (M-mode): 3.7 cm LEFT ATRIUM  Index       RIGHT ATRIUM           Index LA diam:        4.30 cm 2.22 cm/m  RA Area:     16.10 cm LA Vol (A2C):   84.0 ml 43.46 ml/m RA Volume:   43.50 ml  22.50 ml/m LA Vol (A4C):   56.0 ml 28.97 ml/m LA Biplane Vol: 69.5 ml 35.96 ml/m  AORTIC VALVE                   PULMONIC VALVE AV Area (Vmax):    1.41 cm    PV Vmax:        0.18 m/s AV Area (Vmean):   1.72 cm    PV Peak grad:   0.1 mmHg AV  Area (VTI):     1.85 cm    RVOT Peak grad: 1 mmHg AV Vmax:           66.00 cm/s AV Vmean:          42.200 cm/s AV VTI:            0.072 m AV Peak Grad:      1.7 mmHg AV Mean Grad:      1.0 mmHg LVOT Vmax:         26.80 cm/s LVOT Vmean:        21.000 cm/s LVOT VTI:          0.039 m LVOT/AV VTI ratio: 0.54  AORTA Ao Root diam: 2.83 cm MITRAL VALVE                TRICUSPID VALVE MV Area (PHT): 5.16 cm     TR Peak grad:   32.7 mmHg MV Decel Time: 147 msec     TR Vmax:        286.00 cm/s MV E velocity: 118.00 cm/s                             SHUNTS                             Systemic VTI:  0.04 m                             Systemic Diam: 2.10 cm Neoma Laming MD Electronically signed by Neoma Laming MD Signature Date/Time: 04/19/2020/8:09:07 AM    Final       Scheduled Meds: . aspirin  81 mg Oral Daily  . atorvastatin  40 mg Oral Daily  . enoxaparin (LOVENOX) injection  1 mg/kg Subcutaneous Q24H  . folic acid  1 mg Oral Daily  . LORazepam  0-4 mg Intravenous Q6H   Followed by  . LORazepam  0-4 mg Intravenous Q12H  . metoprolol succinate  25 mg Oral Daily  . multivitamin with minerals  1 tablet Oral Daily  . thiamine  100 mg Oral Daily   Or  . thiamine  100 mg Intravenous Daily   Continuous Infusions:    LOS: 2 days      Debbe Odea, MD [facility] Pager: [email or web address] 04/20/2020, 11:05 AM

## 2020-04-20 NOTE — Progress Notes (Signed)
Mobility Specialist - Progress Note   04/20/20 1300  Mobility  Activity Ambulated in room  Range of Motion/Exercises Right leg;Left leg  Level of Assistance Minimal assist, patient does 75% or more  Assistive Device Front wheel walker  Distance Ambulated (ft) 30 ft  Mobility Response Tolerated well  Mobility performed by Mobility specialist  $Mobility charge 1 Mobility    Pt c/o stiffness and pain in BLE. Participated in seated therex: ankle pumps and straight leg raises to prepare for ambulation. Pt ambulated in room, trial on RA. O2 > 90% throughout, but maintained low 90s during session. Persistent dizziness throughout with BP 92/58 prior to OOB activity. Several short rest breaks taken for tolerance. BP improved to 98/62 after activity. Pt c/o SOB once returned supine and was left on 1L for comfort. O2 95% at exit. A-fib HR 75-115 bpm. MinA for bed mobility, SBA ambulation. Poor pleth on multiple devices.    Kathee Delton Mobility Specialist 04/20/20, 1:25 PM

## 2020-04-20 NOTE — Progress Notes (Signed)
SUBJECTIVE: Patient resting in bed. States he continues to have dyspnea and it has not improved. Denies chest pain. Daughter at bedside.   Vitals:   04/20/20 0521 04/20/20 0600 04/20/20 0850 04/20/20 1236  BP: 100/84 100/84 123/79 126/72  Pulse: 87 87 77 80  Resp: 18  16 16   Temp: 98 F (36.7 C)  98.1 F (36.7 C) 98 F (36.7 C)  TempSrc:   Oral Oral  SpO2: 100%  100% 98%  Weight: 79.8 kg     Height:        Intake/Output Summary (Last 24 hours) at 04/20/2020 1520 Last data filed at 04/20/2020 0700 Gross per 24 hour  Intake --  Output 110 ml  Net -110 ml    LABS: Basic Metabolic Panel: Recent Labs    04/18/20 0843 04/18/20 1551 04/19/20 0429 04/20/20 0409  NA  --    < > 134* 132*  K  --    < > 3.9 3.8  CL  --    < > 94* 93*  CO2  --    < > 19* 23  GLUCOSE  --    < > 136* 124*  BUN  --    < > 25* 35*  CREATININE  --    < > 2.12* 2.54*  CALCIUM  --    < > 8.2* 8.1*  MG 1.9  --   --   --    < > = values in this interval not displayed.   Liver Function Tests: Recent Labs    04/18/20 0832  AST 24  ALT 10  ALKPHOS 71  BILITOT 1.9*  PROT 7.6  ALBUMIN 2.7*   No results for input(s): LIPASE, AMYLASE in the last 72 hours. CBC: Recent Labs    04/18/20 0832 04/19/20 0429  WBC 6.3 9.9  HGB 13.5 13.1  HCT 39.4 39.8  MCV 81.1 84.0  PLT 261 254   Cardiac Enzymes: No results for input(s): CKTOTAL, CKMB, CKMBINDEX, TROPONINI in the last 72 hours. BNP: Invalid input(s): POCBNP D-Dimer: No results for input(s): DDIMER in the last 72 hours. Hemoglobin A1C: No results for input(s): HGBA1C in the last 72 hours. Fasting Lipid Panel: Recent Labs    04/19/20 0429  CHOL 126  HDL 30*  LDLCALC 82  TRIG 69  CHOLHDL 4.2   Thyroid Function Tests: Recent Labs    04/18/20 1039  TSH 3.963   Anemia Panel: No results for input(s): VITAMINB12, FOLATE, FERRITIN, TIBC, IRON, RETICCTPCT in the last 72 hours.   PHYSICAL EXAM General: Well developed, well nourished,  in no acute distress HEENT:  Normocephalic and atramatic Neck:  No JVD.  Lungs: Diminished bilaterally to auscultation and percussion. Heart: Irregularly irregular Abdomen: Bowel sounds are positive, abdomen soft and non-tender  Msk:  Back normal, normal gait. Normal strength and tone for age. Extremities: +1 pitting edema BLE.   Neuro: Drowsy and oriented X 3. Psych:  Good affect, responds appropriately  TELEMETRY: Atrial fibrillation. 75/bpm  ASSESSMENT AND PLAN: Patient presenting to the emergency department with worsening dyspnea and found to have atrial fibrillation with RVR. Patient had converted back sinus rhythm, amiodarone drip stopped, but is now back in atrial fibrillation at this time. Due to pulmonary fibrosis, recommend continuing to hold amiodarone oral dosing and will continue metoprolol succinate for rate control.  Prior to discharge please convert patient back to Xarelto. Echocardiogram with worsening EF of now <20% and severe MR. With worsening kidney function, continue to hold entresto at  this time. Consider adding Farxiga as GFR > 25. Will continue to follow  Principal Problem:   Atrial fibrillation with rapid ventricular response (HCC) Active Problems:   Chronic systolic CHF (congestive heart failure) (HCC)   History of alcoholism (HCC)   Hypokalemia   Fibrosis, pulmonary, interstitial, diffuse (HCC)   Hypotension   Non compliance w medication regimen    Jason Sill, NP-C 04/20/2020 3:20 PM

## 2020-04-20 NOTE — Plan of Care (Signed)
  Problem: Health Behavior/Discharge Planning: Goal: Ability to manage health-related needs will improve Outcome: Progressing   Problem: Clinical Measurements: Goal: Ability to maintain clinical measurements within normal limits will improve Outcome: Progressing   Problem: Clinical Measurements: Goal: Respiratory complications will improve Outcome: Progressing   Problem: Pain Managment: Goal: General experience of comfort will improve Outcome: Progressing   Problem: Safety: Goal: Ability to remain free from injury will improve Outcome: Progressing

## 2020-04-21 LAB — CBC
HCT: 35.5 % — ABNORMAL LOW (ref 39.0–52.0)
Hemoglobin: 12.3 g/dL — ABNORMAL LOW (ref 13.0–17.0)
MCH: 27.4 pg (ref 26.0–34.0)
MCHC: 34.6 g/dL (ref 30.0–36.0)
MCV: 79.1 fL — ABNORMAL LOW (ref 80.0–100.0)
Platelets: 210 10*3/uL (ref 150–400)
RBC: 4.49 MIL/uL (ref 4.22–5.81)
RDW: 14.3 % (ref 11.5–15.5)
WBC: 11.9 10*3/uL — ABNORMAL HIGH (ref 4.0–10.5)
nRBC: 0 % (ref 0.0–0.2)

## 2020-04-21 LAB — BASIC METABOLIC PANEL
Anion gap: 12 (ref 5–15)
BUN: 44 mg/dL — ABNORMAL HIGH (ref 8–23)
CO2: 25 mmol/L (ref 22–32)
Calcium: 7.6 mg/dL — ABNORMAL LOW (ref 8.9–10.3)
Chloride: 96 mmol/L — ABNORMAL LOW (ref 98–111)
Creatinine, Ser: 1.99 mg/dL — ABNORMAL HIGH (ref 0.61–1.24)
GFR, Estimated: 36 mL/min — ABNORMAL LOW (ref >60)
Glucose, Bld: 109 mg/dL — ABNORMAL HIGH (ref 70–99)
Potassium: 3.7 mmol/L (ref 3.5–5.1)
Sodium: 133 mmol/L — ABNORMAL LOW (ref 135–145)

## 2020-04-21 MED ORDER — LORAZEPAM 2 MG/ML IJ SOLN: 1.0000 mg | INTRAMUSCULAR | Status: DC | PRN

## 2020-04-21 MED ORDER — LORAZEPAM 1 MG PO TABS: 1.0000 mg | ORAL_TABLET | ORAL | Status: DC | PRN

## 2020-04-21 MED ORDER — DAPAGLIFLOZIN PROPANEDIOL 5 MG PO TABS
10.0000 mg | ORAL_TABLET | Freq: Every day | ORAL | Status: DC
  Administered 2020-04-21: 10 mg via ORAL
  Filled 2020-04-21 (×2): qty 2

## 2020-04-21 MED ORDER — LORAZEPAM 1 MG PO TABS
1.0000 mg | ORAL_TABLET | Freq: Once | ORAL | Status: AC
  Administered 2020-04-21: 1 mg via ORAL
  Filled 2020-04-21: qty 1

## 2020-04-21 MED ORDER — ENOXAPARIN SODIUM 80 MG/0.8ML ~~LOC~~ SOLN
1.0000 mg/kg | Freq: Two times a day (BID) | SUBCUTANEOUS | Status: DC
  Administered 2020-04-21 – 2020-04-22 (×3): 80 mg via SUBCUTANEOUS
  Filled 2020-04-21 (×3): qty 0.8

## 2020-04-21 NOTE — Progress Notes (Addendum)
Pt is complaining of pain on back, PRN tylenol is ordered, but per pt cannot take tylenol because it makes pt heart beat fast. NP Ouma made aware. Will continue to monitor.  Update 2155: Ouma NP states will place order. Will continue to monitor.

## 2020-04-21 NOTE — Progress Notes (Signed)
PROGRESS NOTE    Jason Fitzgerald   MPN:[id number]  DOB: Jun 19, 1950  DOA: 04/18/2020 PCP: Steele Sizer, MD   Brief Narrative:  Jason Fitzgerald  is a 70 y.o. male with medical history significant for hypertension, hyperlipidemia, rheumatoid arthritis, COPD, GERD, interstitial lung disease, pulmonary fibrosis,sCHF with EF of 25 to 30%, atrial fibrillation, rheumatoid arthritis, alcohol abuse, who presents to the emergency room via private vehicle for evaluation of chest pain which he has had for 3 days and states it feels like his prior heart attack.  Admitted to stopping all of his medications many months ago because they made him feel that.  In the ED he is found to be in A. fib with RVR.  Chest x-ray reveals extensive fibrosis and small bilateral pleural effusions. Sodium 132 Potassium 2.6 WBC count 6.3 BNP 32   Subjective: Quite sleepy today. No complaints.      Assessment & Plan:   Principal Problem:   Paroxysmal atrial fibrillation with rapid ventricular response  -Previously on metoprolol, amiodarone and Xarelto but stopped these -Appreciate cardiology eval -He has been on an amiodarone infusion since being admitted order to normal sinus rhythm -As he already has severe pulmonary fibrosis, I will not be placing him on oral amiodarone but instead will continue Toprol which is cardioselective and titrate the dose up slowly if possible   Active Problems:   Chronic systolic CHF (congestive heart failure) -Previous EF 25 to 30%-- --Repeat 2D echo obtained yesterday reveals an EF that is less than 20% with global hypokinesis and severe dilatation, grade 2 diastolic dysfunction, RV, moderately enlarged, bilateral atria severely enlarged, severe mitral regurgitation. -Compensated at this time - holding Entresto and furosemide as Cr has risen - cardiology has ordered a life vest and Farxiga  AKI with metabolic acidosis - Cr   0.08>6.76> 2.12 > 2.54 - he has not received  any offending agents - may be due to RVR and poor cardiac output  - bladder scan negative - renal ultrasound unremarkable - Cr improved today to 1.99- recheck tomorrow    History of alcoholism  -Continue Ativan/CIWA scale- he states he drinks daily- he has received one dose of Ativan so far in the hospital so has not had any withdrawl    Hypokalemia -Replaced    Fibrosis, pulmonary, interstitial, diffuse  -Follows with Dr. Raul Del    Non compliance w medication regimen  Disposition> - appreciate palliative care discussion- no final decision made in regards to home with hospice vs palliative care at home. The patient wants more information from the hospice team and the hospice liaison has been contacted. He remains a full code. I recommend DNR and hospice care.   Time spent in minutes: 35 DVT prophylaxis: Lovenox Code Status: Full code Family Communication:  With his sister, Regino Schultze, who he makes decisions with and with brother Ronnie Level of Care: Level of care: Progressive Cardiac Disposition Plan:  Status is: Inpatient  Remains inpatient appropriate because:Inpatient level of care appropriate due to severity of illness   Dispo: The patient is from: Home              Anticipated d/c is to: home tomorrow              Patient currently is not medically stable to d/c.   Difficult to place patient No   Consultants:   Palliative care  Cardiology  Procedures:   none Antimicrobials:  Anti-infectives (From admission, onward)   None  Objective: Vitals:   04/21/20 0032 04/21/20 0404 04/21/20 0900 04/21/20 1225  BP: 98/76 106/70 110/74 112/70  Pulse: 83 75 80 76  Resp: 16 16 16 16   Temp:  98.2 F (36.8 C) 97.9 F (36.6 C) 98 F (36.7 C)  TempSrc:  Axillary Axillary Axillary  SpO2: 99% 99% 98% 96%  Weight:      Height:        Intake/Output Summary (Last 24 hours) at 04/21/2020 1248 Last data filed at 04/21/2020 0600 Gross per 24 hour  Intake --  Output  375 ml  Net -375 ml   Filed Weights   04/18/20 0943 04/20/20 0521  Weight: 77.6 kg 79.8 kg    Examination: General exam: Appears comfortable  HEENT: PERRLA, oral mucosa moist, no sclera icterus or thrush Respiratory system: Clear to auscultation. Respiratory effort normal. Cardiovascular system: S1 & S2 heard, regular rate and rhythm Gastrointestinal system: Abdomen soft, non-tender, nondistended. Normal bowel sounds   Central nervous system: Alert and oriented. No focal neurological deficits. Extremities: No cyanosis, clubbing or edema Skin: No rashes or ulcers Psychiatry:  flat affect   Data Reviewed: I have personally reviewed following labs and imaging studies  CBC: Recent Labs  Lab 04/18/20 0832 04/19/20 0429 04/21/20 0802  WBC 6.3 9.9 11.9*  HGB 13.5 13.1 12.3*  HCT 39.4 39.8 35.5*  MCV 81.1 84.0 79.1*  PLT 261 254 956   Basic Metabolic Panel: Recent Labs  Lab 04/18/20 0832 04/18/20 0843 04/18/20 1551 04/19/20 0429 04/20/20 0409 04/21/20 0802  NA 132*  --  134* 134* 132* 133*  K 2.6*  --  3.2* 3.9 3.8 3.7  CL 94*  --  95* 94* 93* 96*  CO2 25  --  24 19* 23 25  GLUCOSE 112*  --  158* 136* 124* 109*  BUN 18  --  20 25* 35* 44*  CREATININE 0.88  --  1.17 2.12* 2.54* 1.99*  CALCIUM 7.8*  --  7.8* 8.2* 8.1* 7.6*  MG  --  1.9  --   --   --   --    GFR: Estimated Creatinine Clearance: 35.5 mL/min (A) (by C-G formula based on SCr of 1.99 mg/dL (H)). Liver Function Tests: Recent Labs  Lab 04/18/20 0832  AST 24  ALT 10  ALKPHOS 71  BILITOT 1.9*  PROT 7.6  ALBUMIN 2.7*   No results for input(s): LIPASE, AMYLASE in the last 168 hours. No results for input(s): AMMONIA in the last 168 hours. Coagulation Profile: Recent Labs  Lab 04/18/20 0843  INR 1.4*   Cardiac Enzymes: No results for input(s): CKTOTAL, CKMB, CKMBINDEX, TROPONINI in the last 168 hours. BNP (last 3 results) No results for input(s): PROBNP in the last 8760 hours. HbA1C: No results  for input(s): HGBA1C in the last 72 hours. CBG: No results for input(s): GLUCAP in the last 168 hours. Lipid Profile: Recent Labs    04/19/20 0429  CHOL 126  HDL 30*  LDLCALC 82  TRIG 69  CHOLHDL 4.2   Thyroid Function Tests: No results for input(s): TSH, T4TOTAL, FREET4, T3FREE, THYROIDAB in the last 72 hours. Anemia Panel: No results for input(s): VITAMINB12, FOLATE, FERRITIN, TIBC, IRON, RETICCTPCT in the last 72 hours. Urine analysis:    Component Value Date/Time   COLORURINE AMBER (A) 12/17/2018 1437   APPEARANCEUR HAZY (A) 12/17/2018 1437   LABSPEC 1.014 12/17/2018 1437   PHURINE 6.0 12/17/2018 1437   [place] 12/17/2018 1437   [place] 12/17/2018 1437  BILIRUBINUR NEGATIVE 12/17/2018 1437   BILIRUBINUR Negative 12/08/2017 0947   KETONESUR 20 (A) 12/17/2018 1437   PROTEINUR 30 (A) 12/17/2018 1437   UROBILINOGEN 1.0 12/08/2017 0947   NITRITE NEGATIVE 12/17/2018 1437   LEUKOCYTESUR NEGATIVE 12/17/2018 1437   Sepsis Labs: @LABRCNTIP (procalcitonin:4,lacticidven:4) ) Recent Results (from the past 240 hour(s))  Resp Panel by RT-PCR (Flu A&B, Covid) Nasopharyngeal Swab     Status: None   Collection Time: 04/18/20  8:44 AM   Specimen: Nasopharyngeal Swab; Nasopharyngeal(NP) swabs in vial transport medium  Result Value Ref Range Status   SARS Coronavirus 2 by RT PCR NEGATIVE NEGATIVE Final    Comment: (NOTE) SARS-CoV-2 target nucleic acids are NOT DETECTED.  The SARS-CoV-2 RNA is generally detectable in upper respiratory specimens during the acute phase of infection. The lowest concentration of SARS-CoV-2 viral copies this assay can detect is 138 copies/mL. A negative result does not preclude SARS-Cov-2 infection and should not be used as the sole basis for treatment or other patient management decisions. A negative result may occur with  improper specimen collection/handling, submission of specimen other than nasopharyngeal swab, presence of viral  mutation(s) within the areas targeted by this assay, and inadequate number of viral copies(<138 copies/mL). A negative result must be combined with clinical observations, patient history, and epidemiological information. The expected result is Negative.  Fact Sheet for Patients:  EntrepreneurPulse.com.au  Fact Sheet for Healthcare Providers:  IncredibleEmployment.be  This test is no t yet approved or cleared by the [place] FDA and  has been authorized for detection and/or diagnosis of SARS-CoV-2 by FDA under an Emergency Use Authorization (EUA). This EUA will remain  in effect (meaning this test can be used) for the duration of the COVID-19 declaration under Section 564(b)(1) of the Act, 21 U.S.C.section 360bbb-3(b)(1), unless the authorization is terminated  or revoked sooner.       Influenza A by PCR NEGATIVE NEGATIVE Final   Influenza B by PCR NEGATIVE NEGATIVE Final    Comment: (NOTE) The Xpert Xpress SARS-CoV-2/FLU/RSV plus assay is intended as an aid in the diagnosis of influenza from Nasopharyngeal swab specimens and should not be used as a sole basis for treatment. Nasal washings and aspirates are unacceptable for Xpert Xpress SARS-CoV-2/FLU/RSV testing.  Fact Sheet for Patients: EntrepreneurPulse.com.au  Fact Sheet for Healthcare Providers: IncredibleEmployment.be  This test is not yet approved or cleared by the [place] FDA and has been authorized for detection and/or diagnosis of SARS-CoV-2 by FDA under an Emergency Use Authorization (EUA). This EUA will remain in effect (meaning this test can be used) for the duration of the COVID-19 declaration under Section 564(b)(1) of the Act, 21 U.S.C. section 360bbb-3(b)(1), unless the authorization is terminated or revoked.  Performed at [facility], [address] [place], [place] [address]          Radiology  Studies: US RENAL  Result Date: 04/20/2020 CLINICAL DATA:  AK I EXAM: RENAL / URINARY TRACT ULTRASOUND COMPLETE COMPARISON:  None. FINDINGS: Right Kidney: Renal measurements: 11.3 x 5.3 x 6.0 cm = volume: 187 mL. Echogenicity within normal limits. No mass or hydronephrosis visualized. Left Kidney: Renal measurements: 11.7 x 5.5 x 5.8 cm = volume: 194 mL. Echogenicity within normal limits. No mass or hydronephrosis visualized. Bladder: Decompressed with Foley catheter in place. Other: None. IMPRESSION: Unremarkable sonographic appearance of the bilateral kidneys. Electronically Signed   By: Audie Pinto M.D.   On: 04/20/2020 11:49      Scheduled Meds: . aspirin  81 mg Oral Daily  . atorvastatin  40 mg Oral Daily  . dapagliflozin propanediol  10 mg Oral Daily  . enoxaparin (LOVENOX) injection  1 mg/kg Subcutaneous Q12H  . folic acid  1 mg Oral Daily  . LORazepam  0-4 mg Intravenous Q12H  . metoprolol succinate  25 mg Oral Daily  . multivitamin with minerals  1 tablet Oral Daily  . thiamine  100 mg Oral Daily   Or  . thiamine  100 mg Intravenous Daily   Continuous Infusions:    LOS: 3 days      Debbe Odea, MD [facility] Pager: [email or web address] 04/21/2020, 12:48 PM

## 2020-04-21 NOTE — Progress Notes (Signed)
SUBJECTIVE: Patient resting in bed.  Continues to have complaints of dyspnea but denies chest pain.   Vitals:   04/20/20 2000 04/21/20 0032 04/21/20 0404 04/21/20 0900  BP: 105/64 98/76 106/70 110/74  Pulse: 67 83 75 80  Resp: 16 16 16 16   Temp: 98 F (36.7 C)  98.2 F (36.8 C) 97.9 F (36.6 C)  TempSrc: Oral  Axillary Axillary  SpO2: 96% 99% 99% 98%  Weight:      Height:        Intake/Output Summary (Last 24 hours) at 04/21/2020 1053 Last data filed at 04/21/2020 0600 Gross per 24 hour  Intake --  Output 375 ml  Net -375 ml    LABS: Basic Metabolic Panel: Recent Labs    04/20/20 0409 04/21/20 0802  NA 132* 133*  K 3.8 3.7  CL 93* 96*  CO2 23 25  GLUCOSE 124* 109*  BUN 35* 44*  CREATININE 2.54* 1.99*  CALCIUM 8.1* 7.6*   Liver Function Tests: No results for input(s): AST, ALT, ALKPHOS, BILITOT, PROT, ALBUMIN in the last 72 hours. No results for input(s): LIPASE, AMYLASE in the last 72 hours. CBC: Recent Labs    04/19/20 0429 04/21/20 0802  WBC 9.9 11.9*  HGB 13.1 12.3*  HCT 39.8 35.5*  MCV 84.0 79.1*  PLT 254 210   Cardiac Enzymes: No results for input(s): CKTOTAL, CKMB, CKMBINDEX, TROPONINI in the last 72 hours. BNP: Invalid input(s): POCBNP D-Dimer: No results for input(s): DDIMER in the last 72 hours. Hemoglobin A1C: No results for input(s): HGBA1C in the last 72 hours. Fasting Lipid Panel: Recent Labs    04/19/20 0429  CHOL 126  HDL 30*  LDLCALC 82  TRIG 69  CHOLHDL 4.2   Thyroid Function Tests: No results for input(s): TSH, T4TOTAL, T3FREE, THYROIDAB in the last 72 hours.  Invalid input(s): FREET3 Anemia Panel: No results for input(s): VITAMINB12, FOLATE, FERRITIN, TIBC, IRON, RETICCTPCT in the last 72 hours.   PHYSICAL EXAM General: Well developed, well nourished, in no acute distress HEENT:  Normocephalic and atramatic Neck:  No JVD.  Lungs: Clear bilaterally to auscultation and percussion. Heart: Atrial fibrillation.   Abdomen: Bowel sounds are positive, abdomen soft and non-tender  Msk:  Back normal, normal gait. Normal strength and tone for age. Extremities: No clubbing, cyanosis or edema.   Neuro: Alert and oriented X 3. Psych:  Good affect, responds appropriately  TELEMETRY: Atrial Fibrillation 98/bpm  ASSESSMENT AND PLAN: Patient presenting to the emergency department with worsening dyspnea and found to have atrial fibrillation with RVR.Patient remaining in atrial fibrillation, but less rate controlled and therefore will increase metoprolol dosing. Due to pulmonary fibrosis, recommend continuing to hold amiodarone. Prior to discharge please convert patient back to Xarelto. Kidney function mildly improved, will start Farxiga 10mg  daily for HFrEF management. Will also have patient fitted for Life Vest prior to discharge Will continue to follow  Principal Problem:   Atrial fibrillation with rapid ventricular response (HCC) Active Problems:   Chronic systolic CHF (congestive heart failure) (HCC)   History of alcoholism (HCC)   Hypokalemia   Fibrosis, pulmonary, interstitial, diffuse (HCC)   Hypotension   Non compliance w medication regimen    Adaline Sill, NP-C 04/21/2020 10:53 AM

## 2020-04-22 DIAGNOSIS — E876 Hypokalemia: Secondary | ICD-10-CM

## 2020-04-22 DIAGNOSIS — N179 Acute kidney failure, unspecified: Secondary | ICD-10-CM

## 2020-04-22 DIAGNOSIS — Z66 Do not resuscitate: Secondary | ICD-10-CM

## 2020-04-22 DIAGNOSIS — Z9114 Patient's other noncompliance with medication regimen: Secondary | ICD-10-CM

## 2020-04-22 LAB — BASIC METABOLIC PANEL
Anion gap: 14 (ref 5–15)
BUN: 52 mg/dL — ABNORMAL HIGH (ref 8–23)
CO2: 24 mmol/L (ref 22–32)
Calcium: 7.7 mg/dL — ABNORMAL LOW (ref 8.9–10.3)
Chloride: 95 mmol/L — ABNORMAL LOW (ref 98–111)
Creatinine, Ser: 1.75 mg/dL — ABNORMAL HIGH (ref 0.61–1.24)
GFR, Estimated: 42 mL/min — ABNORMAL LOW (ref >60)
Glucose, Bld: 127 mg/dL — ABNORMAL HIGH (ref 70–99)
Potassium: 3.4 mmol/L — ABNORMAL LOW (ref 3.5–5.1)
Sodium: 133 mmol/L — ABNORMAL LOW (ref 135–145)

## 2020-04-22 MED ORDER — POTASSIUM CHLORIDE CRYS ER 20 MEQ PO TBCR: 20.0000 meq | EXTENDED_RELEASE_TABLET | Freq: Every day | ORAL | 0 refills | Status: DC

## 2020-04-22 MED ORDER — POTASSIUM CHLORIDE CRYS ER 20 MEQ PO TBCR: 20.0000 meq | EXTENDED_RELEASE_TABLET | Freq: Every day | ORAL | 0 refills | Status: DC | PRN

## 2020-04-22 MED ORDER — ACETAMINOPHEN 325 MG PO TABS: 650.0000 mg | ORAL_TABLET | ORAL | Status: DC | PRN

## 2020-04-22 MED ORDER — SACUBITRIL-VALSARTAN 24-26 MG PO TABS: 1.0000 | ORAL_TABLET | Freq: Two times a day (BID) | ORAL | Status: DC

## 2020-04-22 MED ORDER — FOLIC ACID 1 MG PO TABS: 1.0000 mg | ORAL_TABLET | Freq: Every day | ORAL | 0 refills | Status: DC

## 2020-04-22 MED ORDER — SACUBITRIL-VALSARTAN 24-26 MG PO TABS: 1.0000 | ORAL_TABLET | Freq: Two times a day (BID) | ORAL | 2 refills | Status: DC

## 2020-04-22 MED ORDER — FUROSEMIDE 20 MG PO TABS: 20.0000 mg | ORAL_TABLET | Freq: Every day | ORAL | 11 refills | Status: DC | PRN

## 2020-04-22 MED ORDER — POTASSIUM CHLORIDE CRYS ER 20 MEQ PO TBCR
40.0000 meq | EXTENDED_RELEASE_TABLET | ORAL | Status: DC
  Administered 2020-04-22: 40 meq via ORAL
  Filled 2020-04-22: qty 2

## 2020-04-22 MED ORDER — DAPAGLIFLOZIN PROPANEDIOL 10 MG PO TABS: 10.0000 mg | ORAL_TABLET | Freq: Every day | ORAL | 0 refills | Status: DC

## 2020-04-22 MED ORDER — ADULT MULTIVITAMIN W/MINERALS CH: 1.0000 | ORAL_TABLET | Freq: Every day | ORAL | 0 refills | Status: DC

## 2020-04-22 MED ORDER — METOPROLOL SUCCINATE ER 25 MG PO TB24: 25.0000 mg | ORAL_TABLET | Freq: Every day | ORAL | 0 refills | Status: DC

## 2020-04-22 MED ORDER — FUROSEMIDE 20 MG PO TABS: 20.0000 mg | ORAL_TABLET | Freq: Every day | ORAL | 11 refills | Status: DC

## 2020-04-22 MED ORDER — THIAMINE HCL 100 MG PO TABS: 100.0000 mg | ORAL_TABLET | Freq: Every day | ORAL | 0 refills | Status: DC

## 2020-04-22 MED ORDER — ATORVASTATIN CALCIUM 40 MG PO TABS: 40.0000 mg | ORAL_TABLET | Freq: Every day | ORAL | 0 refills | Status: DC

## 2020-04-22 NOTE — Plan of Care (Signed)
  Problem: Activity: Goal: Ability to tolerate increased activity will improve Outcome: Progressing   Problem: Clinical Measurements: Goal: Respiratory complications will improve Outcome: Progressing   Problem: Clinical Measurements: Goal: Cardiovascular complication will be avoided Outcome: Progressing   Problem: Pain Managment: Goal: General experience of comfort will improve Outcome: Progressing   Problem: Safety: Goal: Ability to remain free from injury will improve Outcome: Progressing

## 2020-04-22 NOTE — Progress Notes (Signed)
SUBJECTIVE: Patient states he is feeling better. Continues to have mild dyspnea but improved. States he is very weak, but again is better than yesterday. Denies chest pain.   Vitals:   04/21/20 1941 04/22/20 0000 04/22/20 0446 04/22/20 0813  BP: 99/66 100/70 (!) 92/52 94/64  Pulse: 89   71  Resp: 20 20 20 17   Temp: 97.7 F (36.5 C) 98.7 F (37.1 C) 97.8 F (36.6 C) 97.9 F (36.6 C)  TempSrc: Oral Oral Axillary   SpO2: 100% 100% 100% 100%  Weight:      Height:        Intake/Output Summary (Last 24 hours) at 04/22/2020 1130 Last data filed at 04/22/2020 0658 Gross per 24 hour  Intake -  Output 410 ml  Net -410 ml    LABS: Basic Metabolic Panel: Recent Labs    04/21/20 0802 04/22/20 0655  NA 133* 133*  K 3.7 3.4*  CL 96* 95*  CO2 25 24  GLUCOSE 109* 127*  BUN 44* 52*  CREATININE 1.99* 1.75*  CALCIUM 7.6* 7.7*   Liver Function Tests: No results for input(s): AST, ALT, ALKPHOS, BILITOT, PROT, ALBUMIN in the last 72 hours. No results for input(s): LIPASE, AMYLASE in the last 72 hours. CBC: Recent Labs    04/21/20 0802  WBC 11.9*  HGB 12.3*  HCT 35.5*  MCV 79.1*  PLT 210   Cardiac Enzymes: No results for input(s): CKTOTAL, CKMB, CKMBINDEX, TROPONINI in the last 72 hours. BNP: Invalid input(s): POCBNP D-Dimer: No results for input(s): DDIMER in the last 72 hours. Hemoglobin A1C: No results for input(s): HGBA1C in the last 72 hours. Fasting Lipid Panel: No results for input(s): CHOL, HDL, LDLCALC, TRIG, CHOLHDL, LDLDIRECT in the last 72 hours. Thyroid Function Tests: No results for input(s): TSH, T4TOTAL, T3FREE, THYROIDAB in the last 72 hours.  Invalid input(s): FREET3 Anemia Panel: No results for input(s): VITAMINB12, FOLATE, FERRITIN, TIBC, IRON, RETICCTPCT in the last 72 hours.   PHYSICAL EXAM General: Well developed, well nourished, in no acute distress HEENT:  Normocephalic and atramatic Neck:  No JVD.  Lungs: Clear bilaterally to auscultation  and percussion. Heart: Atrial Fibrillation. Normal S1 and S2 without gallops or murmurs.  Abdomen: Bowel sounds are positive, abdomen soft and non-tender  Msk:  Back normal, normal gait. Normal strength and tone for age. Extremities: No clubbing, cyanosis or edema.   Neuro: Alert and oriented X 3. Psych:  Good affect, responds appropriately  TELEMETRY: Atrial Fibrillation. 74/bpm  ASSESSMENT AND PLAN: Patient presenting to the emergency department with worsening dyspnea and found to have atrial fibrillation with RVR.Atrial fibrillation better rate controlled on metoprolol succinate 50mg  daily.  Due to pulmonary fibrosis, recommend continuing to hold amiodarone. Prior to discharge please convert patient back to Xarelto. Kidney function continuing to improve, continue [place] 10mg  daily and will restart Entresto for HFrEF management. Spoke with [facility] and patient fitted for [employer] today. Will continue to follow  Principal Problem:   Atrial fibrillation with rapid ventricular response (HCC) Active Problems:   Chronic systolic CHF (congestive heart failure) (HCC)   History of alcoholism (HCC)   Hypokalemia   Fibrosis, pulmonary, interstitial, diffuse (HCC)   Hypotension   Non compliance w medication regimen    Jason Sill, NP-C 04/22/2020 11:30 AM

## 2020-04-22 NOTE — Progress Notes (Signed)
[place] [facility]) Hospital Liaison RN note:  Received new referral for [facility] out patient palliative program to follow post discharge from Kathie Rhodes, NP. Patient information given to referral. Plan is to discharge home today. Palliative will follow up with patient ASAP. Bridget Cobb, TOC is aware.  Thank you for this referral.  Zandra Abts, RN [facility] Liaison [phone]

## 2020-04-22 NOTE — Discharge Summary (Signed)
Physician Discharge Summary  Jason Fitzgerald ZOX:[id number] DOB: 06-Jun-1950 DOA: 04/18/2020  PCP: Steele Sizer, MD  Admit date: 04/18/2020 Discharge date: 04/22/2020  Admitted From: home Disposition:  home   Recommendations for Outpatient Follow-up:  1. Not a candidate at this time for [facility] due to low BP- Have ordered PRN Lasix-   2.    He is an alcoholic and should not be anticoagulated 3.    He also wishes to be DNR and is refusing a life vest which was ordered this weekend  Home Health:  Palliative care ordered Discharge Condition:  stable   CODE STATUS:  DNR   Diet recommendation:  Heart heatlhy Consultations:  cardiology  Procedures/Studies: . 2 D ECHO   Discharge Diagnoses:  Principal Problem:   Atrial fibrillation with rapid ventricular response (HCC) Active Problems:   Chronic systolic CHF (congestive heart failure) (HCC)   History of alcoholism ([place])   Hypokalemia   Fibrosis, pulmonary, interstitial, diffuse (HCC)   Hypotension   Non compliance w medication regimen   DNR (do not resuscitate)     Brief Summary: Jason Fitzgerald is a 70 y.o.malewith medical historysignificant forhypertension, hyperlipidemia, rheumatoid arthritis, COPD, GERD, interstitial lung disease, pulmonary fibrosis,sCHF with EF of25to 30%, atrial fibrillation, rheumatoid arthritis, alcohol abuse, who presentsto the emergency room via private vehicle for evaluation of chest pain which he has had for 3 days and states it feels like his prior heart attack.  Admitted to stopping all of his medications many months ago because they made him feel that.  In the ED he is found to be in A. fib with RVR.  Chest x-ray reveals extensive fibrosis and small bilateral pleural effusions. Sodium 132 Potassium 2.6 WBC count 6.3 BNP 32   Hospital Course:  Principal Problem:   Paroxysmal atrial fibrillation with rapid ventricular response  -Previously on metoprolol, amiodarone and  Xarelto but stopped these on his own -  amiodarone infusion started in ED- converted to normal sinus rhythm -As he already has severe pulmonary fibrosis, I will not be placing him on oral amiodarone but instead will continue Toprol which is cardioselective   - he is an alcoholic and should not be anticoagulated- He also wants to be DNR and is refusing a life vest   Active Problems:   Chronic systolic CHF (congestive heart failure) -Previous was EF 25 to 30%  --Repeat 2D echo reveals an EF that is less than 20% with global hypokinesis and severe dilatation, grade 2 diastolic dysfunction, RV, moderately enlarged, bilateral atria severely enlarged, severe mitral regurgitation. -Compensated at this time -BP in 90s and Cr elevated> holding Entresto, Farxiga  - PRN Lasix/K+ ordered for weight gain - life vest ordered but patient refused it  AKI with metabolic acidosis - Cr   8.11>9.14> 2.12 > peaked at 2.54 on 3/19 - he has not received any offending agents - may be due to RVR and poor cardiac output  - bladder scan negative - renal ultrasound unremarkable - Cr improved today to 1.75 - improving but not at baseline    History of alcoholism  -  he states he drinks daily     Hypokalemia -Replaced    Fibrosis, pulmonary, interstitial, diffuse  -Follows with Dr. Raul Del    Non compliance w medication regimen  Disposition: DNR, Palliative care requested to follow at home  Discharge Exam: Vitals:   04/22/20 0446 04/22/20 0813  BP: (!) 92/52 94/64  Pulse:  71  Resp: 20 17  Temp:  97.8 F (36.6 C) 97.9 F (36.6 C)  SpO2: 100% 100%   Vitals:   04/21/20 1941 04/22/20 0000 04/22/20 0446 04/22/20 0813  BP: 99/66 100/70 (!) 92/52 94/64  Pulse: 89   71  Resp: 20 20 20 17   Temp: 97.7 F (36.5 C) 98.7 F (37.1 C) 97.8 F (36.6 C) 97.9 F (36.6 C)  TempSrc: Oral Oral Axillary   SpO2: 100% 100% 100% 100%  Weight:      Height:        General: Pt is alert, awake, not in  acute distress Cardiovascular: RRR, S1/S2 +, no rubs, no gallops Respiratory: CTA bilaterally, no wheezing, no rhonchi Abdominal: Soft, NT, ND, bowel sounds + Extremities: no edema, no cyanosis   Discharge Instructions  Discharge Instructions    Amb referral to [facility]   Complete by: As directed    Diet - low sodium heart healthy   Complete by: As directed    Increase activity slowly   Complete by: As directed      Allergies as of 04/22/2020   No Known Allergies     Medication List    TAKE these medications   acetaminophen 325 MG tablet Commonly known as: TYLENOL Take 2 tablets (650 mg total) by mouth every 4 (four) hours as needed for headache or mild pain.   atorvastatin 40 MG tablet Commonly known as: LIPITOR Take 1 tablet (40 mg total) by mouth daily.   folic acid 1 MG tablet Commonly known as: FOLVITE Take 1 tablet (1 mg total) by mouth daily.   furosemide 20 MG tablet Commonly known as: Lasix Take 1 tablet (20 mg total) by mouth daily as needed. Take if your weight rises by 3 lb in 2 days   metoprolol succinate 25 MG 24 hr tablet Commonly known as: TOPROL-XL Take 1 tablet (25 mg total) by mouth daily.   multivitamin with minerals Tabs tablet Take 1 tablet by mouth daily.   potassium chloride SA 20 MEQ tablet Commonly known as: KLOR-CON Take 1 tablet (20 mEq total) by mouth daily as needed. Take only when taking Lasix   thiamine 100 MG tablet Take 1 tablet (100 mg total) by mouth daily.            Durable Medical Equipment  (From admission, onward)         Start     Ordered   04/22/20 1149  For home use only DME Vest life vest  Once       Comments: Start date - 04/22/20 Length needed - 3 months   04/22/20 1148   04/21/20 1251  For home use only DME Vest life vest  Once        04/21/20 1250          No Known Allergies    US RENAL  Result Date: 04/20/2020 CLINICAL DATA:  AK I EXAM: RENAL / URINARY TRACT ULTRASOUND COMPLETE  COMPARISON:  None. FINDINGS: Right Kidney: Renal measurements: 11.3 x 5.3 x 6.0 cm = volume: 187 mL. Echogenicity within normal limits. No mass or hydronephrosis visualized. Left Kidney: Renal measurements: 11.7 x 5.5 x 5.8 cm = volume: 194 mL. Echogenicity within normal limits. No mass or hydronephrosis visualized. Bladder: Decompressed with Foley catheter in place. Other: None. IMPRESSION: Unremarkable sonographic appearance of the bilateral kidneys. Electronically Signed   By: Audie Pinto M.D.   On: 04/20/2020 11:49   DG Chest Port 1 View  Result Date: 04/18/2020 CLINICAL DATA:  Chest pain  EXAM: PORTABLE CHEST 1 VIEW COMPARISON:  December 08, 2019 chest radiograph; chest CT December 09, 2019 FINDINGS: There is extensive fibrosis throughout the lungs, most severely in the mid and lower lung regions. There are small pleural effusions bilaterally. The heart size is within normal limits. Pulmonary vascularity is within normal limits. Lymph node prominence noted on prior CT is not well delineated radiography. No bone lesions. IMPRESSION: Extensive underlying fibrosis with small pleural effusions bilaterally. No appreciable new opacity compared to prior studies. Stable cardiac silhouette. Electronically Signed   By: Lowella Grip III M.D.   On: 04/18/2020 09:04   ECHOCARDIOGRAM COMPLETE  Result Date: 04/19/2020    ECHOCARDIOGRAM REPORT   Patient Name:   Jason Fitzgerald Date of Exam: 04/18/2020 Medical Rec #:  [id number]       Height:       69.0 in Accession #:    [id number]      Weight:       171.0 lb Date of Birth:  1950-03-15      BSA:          1.933 m Patient Age:    21 years        BP:           100/68 mmHg Patient Gender: M               HR:           94 bpm. Exam Location:  [facility] Procedure: 2D Echo, Cardiac Doppler and Color Doppler Indications:     CHF-acute systolic G29.52  History:         Patient has prior history of Echocardiogram examinations, most                  recent 12/09/2019. CHF; COPD.   Sonographer:     Sherrie Sport RDCS (AE) Referring Phys:  [id number] [place] Diagnosing Phys: Neoma Laming MD  Sonographer Comments: Suboptimal parasternal window. IMPRESSIONS  1. Left ventricular ejection fraction, by estimation, is <20%. The left ventricle has severely decreased function. The left ventricle demonstrates global hypokinesis. The left ventricular internal cavity size was severely dilated. There is mild concentric left ventricular hypertrophy. Left ventricular diastolic parameters are consistent with Grade II diastolic dysfunction (pseudonormalization).  2. Right ventricular systolic function is mildly reduced. The right ventricular size is moderately enlarged.  3. Left atrial size was severely dilated.  4. Right atrial size was severely dilated.  5. 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 No evidence of mitral stenosis.  6. The aortic valve is grossly normal. Aortic valve regurgitation is not visualized. Mild aortic valve sclerosis is present, with no evidence of aortic valve stenosis. FINDINGS  Left Ventricle: Left ventricular ejection fraction, by estimation, is <20%. The left ventricle has severely decreased function. The left ventricle demonstrates global hypokinesis. The left ventricular internal cavity size was severely dilated. There is mild concentric left ventricular hypertrophy. Left ventricular diastolic parameters are consistent with Grade II diastolic dysfunction (pseudonormalization). Right Ventricle: The right ventricular size is moderately enlarged. No increase in right ventricular wall thickness. Right ventricular systolic function is mildly reduced. Left Atrium: Left atrial size was severely dilated. Right Atrium: Right atrial size was severely dilated. Pericardium: There is no evidence of pericardial effusion. Mitral Valve: 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 No evidence of mitral valve stenosis. Tricuspid Valve: The  tricuspid valve is grossly normal. Tricuspid valve regurgitation is mild. Aortic Valve: The aortic valve is  grossly normal. Aortic valve regurgitation is not visualized. Mild aortic valve sclerosis is present, with no evidence of aortic valve stenosis. Aortic valve mean gradient measures 1.0 mmHg. Aortic valve peak gradient measures 1.7 mmHg. Aortic valve area, by VTI measures 1.85 cm. Pulmonic Valve: The pulmonic valve was normal in structure. Pulmonic valve regurgitation is trivial. Aorta: The aortic root, ascending aorta and aortic arch are all structurally normal, with no evidence of dilitation or obstruction. IAS/Shunts: The interatrial septum was not assessed.  LEFT VENTRICLE PLAX 2D LVIDd:         5.57 cm LVIDs:         5.01 cm LV PW:         0.90 cm LV IVS:        0.78 cm LVOT diam:     2.10 cm LV SV:         13 LV SV Index:   7 LVOT Area:     3.46 cm  LV Volumes (MOD) LV vol d, MOD A2C: 115.0 ml LV vol d, MOD A4C: 194.0 ml LV vol s, MOD A2C: 91.0 ml LV vol s, MOD A4C: 154.0 ml LV SV MOD A2C:     24.0 ml LV SV MOD A4C:     194.0 ml LV SV MOD BP:      27.8 ml RIGHT VENTRICLE RV S prime:     9.79 cm/s TAPSE (M-mode): 3.7 cm LEFT ATRIUM             Index       RIGHT ATRIUM           Index LA diam:        4.30 cm 2.22 cm/m  RA Area:     16.10 cm LA Vol (A2C):   84.0 ml 43.46 ml/m RA Volume:   43.50 ml  22.50 ml/m LA Vol (A4C):   56.0 ml 28.97 ml/m LA Biplane Vol: 69.5 ml 35.96 ml/m  AORTIC VALVE                   PULMONIC VALVE AV Area (Vmax):    1.41 cm    PV Vmax:        0.18 m/s AV Area (Vmean):   1.72 cm    PV Peak grad:   0.1 mmHg AV Area (VTI):     1.85 cm    RVOT Peak grad: 1 mmHg AV Vmax:           66.00 cm/s AV Vmean:          42.200 cm/s AV VTI:            0.072 m AV Peak Grad:      1.7 mmHg AV Mean Grad:      1.0 mmHg LVOT Vmax:         26.80 cm/s LVOT Vmean:        21.000 cm/s LVOT VTI:          0.039 m LVOT/AV VTI ratio: 0.54  AORTA Ao Root diam: 2.83 cm MITRAL VALVE                 TRICUSPID VALVE MV Area (PHT): 5.16 cm     TR Peak grad:   32.7 mmHg MV Decel Time: 147 msec     TR Vmax:        286.00 cm/s MV E velocity: 118.00 cm/s  SHUNTS                             Systemic VTI:  0.04 m                             Systemic Diam: 2.10 cm Neoma Laming MD Electronically signed by Neoma Laming MD Signature Date/Time: 04/19/2020/8:09:07 AM    Final      The results of significant diagnostics from this hospitalization (including imaging, microbiology, ancillary and laboratory) are listed below for reference.     Microbiology: Recent Results (from the past 240 hour(s))  Resp Panel by RT-PCR (Flu A&B, Covid) Nasopharyngeal Swab     Status: None   Collection Time: 04/18/20  8:44 AM   Specimen: Nasopharyngeal Swab; Nasopharyngeal(NP) swabs in vial transport medium  Result Value Ref Range Status   SARS Coronavirus 2 by RT PCR NEGATIVE NEGATIVE Final    Comment: (NOTE) SARS-CoV-2 target nucleic acids are NOT DETECTED.  The SARS-CoV-2 RNA is generally detectable in upper respiratory specimens during the acute phase of infection. The lowest concentration of SARS-CoV-2 viral copies this assay can detect is 138 copies/mL. A negative result does not preclude SARS-Cov-2 infection and should not be used as the sole basis for treatment or other patient management decisions. A negative result may occur with  improper specimen collection/handling, submission of specimen other than nasopharyngeal swab, presence of viral mutation(s) within the areas targeted by this assay, and inadequate number of viral copies(<138 copies/mL). A negative result must be combined with clinical observations, patient history, and epidemiological information. The expected result is Negative.  Fact Sheet for Patients:  EntrepreneurPulse.com.au  Fact Sheet for Healthcare Providers:  IncredibleEmployment.be  This test is no t yet approved or  cleared by the [place] FDA and  has been authorized for detection and/or diagnosis of SARS-CoV-2 by FDA under an Emergency Use Authorization (EUA). This EUA will remain  in effect (meaning this test can be used) for the duration of the COVID-19 declaration under Section 564(b)(1) of the Act, 21 U.S.C.section 360bbb-3(b)(1), unless the authorization is terminated  or revoked sooner.       Influenza A by PCR NEGATIVE NEGATIVE Final   Influenza B by PCR NEGATIVE NEGATIVE Final    Comment: (NOTE) The Xpert Xpress SARS-CoV-2/FLU/RSV plus assay is intended as an aid in the diagnosis of influenza from Nasopharyngeal swab specimens and should not be used as a sole basis for treatment. Nasal washings and aspirates are unacceptable for Xpert Xpress SARS-CoV-2/FLU/RSV testing.  Fact Sheet for Patients: EntrepreneurPulse.com.au  Fact Sheet for Healthcare Providers: IncredibleEmployment.be  This test is not yet approved or cleared by the [place] FDA and has been authorized for detection and/or diagnosis of SARS-CoV-2 by FDA under an Emergency Use Authorization (EUA). This EUA will remain in effect (meaning this test can be used) for the duration of the COVID-19 declaration under Section 564(b)(1) of the Act, 21 U.S.C. section 360bbb-3(b)(1), unless the authorization is terminated or revoked.  Performed at [facility], [address] [place], [place] [address]      Labs: BNP (last 3 results) Recent Labs    08/05/19 0152 12/08/19 0700 04/18/20 0843  BNP 1,110.6* 630.1* 604.5*   Basic Metabolic Panel: Recent Labs  Lab 04/18/20 0843 04/18/20 1551 04/19/20 0429 04/20/20 0409 04/21/20 0802 04/22/20 0655  NA  --  134* 134* 132* 133* 133*  K  --  3.2* 3.9 3.8 3.7 3.4*  CL  --  95* 94* 93* 96* 95*  CO2  --  24 19* 23 25 24   GLUCOSE  --  158* 136* 124* 109* 127*  BUN  --  20 25* 35* 44* 52*  CREATININE  --  1.17 2.12*  2.54* 1.99* 1.75*  CALCIUM  --  7.8* 8.2* 8.1* 7.6* 7.7*  MG 1.9  --   --   --   --   --    Liver Function Tests: Recent Labs  Lab 04/18/20 0832  AST 24  ALT 10  ALKPHOS 71  BILITOT 1.9*  PROT 7.6  ALBUMIN 2.7*   No results for input(s): LIPASE, AMYLASE in the last 168 hours. No results for input(s): AMMONIA in the last 168 hours. CBC: Recent Labs  Lab 04/18/20 0832 04/19/20 0429 04/21/20 0802  WBC 6.3 9.9 11.9*  HGB 13.5 13.1 12.3*  HCT 39.4 39.8 35.5*  MCV 81.1 84.0 79.1*  PLT 261 254 210   Cardiac Enzymes: No results for input(s): CKTOTAL, CKMB, CKMBINDEX, TROPONINI in the last 168 hours. BNP: Invalid input(s): POCBNP CBG: No results for input(s): GLUCAP in the last 168 hours. D-Dimer No results for input(s): DDIMER in the last 72 hours. Hgb A1c No results for input(s): HGBA1C in the last 72 hours. Lipid Profile No results for input(s): CHOL, HDL, LDLCALC, TRIG, CHOLHDL, LDLDIRECT in the last 72 hours. Thyroid function studies No results for input(s): TSH, T4TOTAL, T3FREE, THYROIDAB in the last 72 hours.  Invalid input(s): FREET3 Anemia work up No results for input(s): VITAMINB12, FOLATE, FERRITIN, TIBC, IRON, RETICCTPCT in the last 72 hours. Urinalysis    Component Value Date/Time   COLORURINE AMBER (A) 12/17/2018 1437   APPEARANCEUR HAZY (A) 12/17/2018 1437   LABSPEC 1.014 12/17/2018 1437   PHURINE 6.0 12/17/2018 1437   GLUCOSEU NEGATIVE 12/17/2018 1437   HGBUR NEGATIVE 12/17/2018 1437   BILIRUBINUR NEGATIVE 12/17/2018 1437   BILIRUBINUR Negative 12/08/2017 0947   KETONESUR 20 (A) 12/17/2018 1437   PROTEINUR 30 (A) 12/17/2018 1437   UROBILINOGEN 1.0 12/08/2017 0947   NITRITE NEGATIVE 12/17/2018 1437   LEUKOCYTESUR NEGATIVE 12/17/2018 1437   Sepsis Labs Invalid input(s): PROCALCITONIN,  WBC,  LACTICIDVEN Microbiology Recent Results (from the past 240 hour(s))  Resp Panel by RT-PCR (Flu A&B, Covid) Nasopharyngeal Swab     Status: None    Collection Time: 04/18/20  8:44 AM   Specimen: Nasopharyngeal Swab; Nasopharyngeal(NP) swabs in vial transport medium  Result Value Ref Range Status   SARS Coronavirus 2 by RT PCR NEGATIVE NEGATIVE Final    Comment: (NOTE) SARS-CoV-2 target nucleic acids are NOT DETECTED.  The SARS-CoV-2 RNA is generally detectable in upper respiratory specimens during the acute phase of infection. The lowest concentration of SARS-CoV-2 viral copies this assay can detect is 138 copies/mL. A negative result does not preclude SARS-Cov-2 infection and should not be used as the sole basis for treatment or other patient management decisions. A negative result may occur with  improper specimen collection/handling, submission of specimen other than nasopharyngeal swab, presence of viral mutation(s) within the areas targeted by this assay, and inadequate number of viral copies(<138 copies/mL). A negative result must be combined with clinical observations, patient history, and epidemiological information. The expected result is Negative.  Fact Sheet for Patients:  EntrepreneurPulse.com.au  Fact Sheet for Healthcare Providers:  IncredibleEmployment.be  This test is no t yet approved or cleared by the [place] and  has been authorized for detection and/or diagnosis of SARS-CoV-2 by FDA under an Emergency Use Authorization (EUA). This EUA will remain  in effect (meaning this test can be used) for the duration of the COVID-19 declaration under Section 564(b)(1) of the Act, 21 U.S.C.section 360bbb-3(b)(1), unless the authorization is terminated  or revoked sooner.       Influenza A by PCR NEGATIVE NEGATIVE Final   Influenza B by PCR NEGATIVE NEGATIVE Final    Comment: (NOTE) The Xpert Xpress SARS-CoV-2/FLU/RSV plus assay is intended as an aid in the diagnosis of influenza from Nasopharyngeal swab specimens and should not be used as a sole basis for treatment.  Nasal washings and aspirates are unacceptable for Xpert Xpress SARS-CoV-2/FLU/RSV testing.  Fact Sheet for Patients: EntrepreneurPulse.com.au  Fact Sheet for Healthcare Providers: IncredibleEmployment.be  This test is not yet approved or cleared by the [place] FDA and has been authorized for detection and/or diagnosis of SARS-CoV-2 by FDA under an Emergency Use Authorization (EUA). This EUA will remain in effect (meaning this test can be used) for the duration of the COVID-19 declaration under Section 564(b)(1) of the Act, 21 U.S.C. section 360bbb-3(b)(1), unless the authorization is terminated or revoked.  Performed at [facility], [address] [place], [place] [address]      Time coordinating discharge in minutes: 65  SIGNED:   Debbe Odea, MD  [facility] 04/22/2020, 11:58 AM

## 2020-04-22 NOTE — Care Management Important Message (Signed)
Important Message  Patient Details  Name: Jason Fitzgerald MRN: [id number] Date of Birth: 1950/02/11   Medicare Important Message Given:  Yes     [name] 04/22/2020, 12:07 PM

## 2020-04-22 NOTE — Consult Note (Signed)
   Heart Failure Nurse Navigator Note  HFrEF < 20%    He presented to the emergency room with complaints of worsening dyspnea and was noted to be in atrial fibrillation with RVR.  Comorbidities ;  Alcohol abuse Pulmonary fibrosis Noncompliance  Medication:  Aspirin 81 mg daily Atorvastatin 40 mg daily Metoprolol succinate 25 mg daily Entresto 24/26 mg 2 times a day   Labs:  Sodium 138, potassium 2.4, chloride 95, CO2 24, BUN 52, creatinine 1.75 Intake not documented Output 410 mL Weight 79.8 kg-weight from 2 days ago. Blood pressure 88/72 BMI 25.99   Assessment:  General-he is lying in bed with his eyes closed, appears in no acute distress.  HEENT normocephalic,  Cardiac-  Chest-breath sounds are clear to posterior auscultation  Abdomen-soft nontender  Musculoskeletal-no lower extremity edema  Psych-patient lying there with his eyes closed offers some conversation, does not make eye contact.  Neurologic-speech is clear, moves all extremities.    Initial meeting with patient today, states he just wants his paperwork and to go home.  Continue to lie there with his eyes closed.  Attempted to discussed diet and importance of fluid restriction along with daily weights and recording.    He has refused LifeVest.  Left living with heart failure teaching booklet along with his own magnet and information about the heart failure clinic.  He is to be discharged today in a follow-up with palliative care.  Pricilla Riffle RN [facility]

## 2020-04-23 ENCOUNTER — Telehealth: Payer: Self-pay

## 2020-04-23 NOTE — Telephone Encounter (Signed)
Transition Care Management Unsuccessful Follow-up Telephone Call  Date of discharge and from where:  04/22/20 [facility]  Attempts:  1st Attempt  Reason for unsuccessful TCM follow-up call:  Left voice message. Pt may reach me at [phone]

## 2020-04-24 ENCOUNTER — Telehealth: Payer: Self-pay

## 2020-04-24 NOTE — Telephone Encounter (Signed)
Pt states he still feels very sick- refuses to go back to ER. He just kept repeating "I'm very sick" but would not specify symptoms he is having. I urged him to go back to ER since symptoms are worse/not improving. He declined.

## 2020-04-24 NOTE — Telephone Encounter (Cosign Needed)
Transition Care Management Follow-up Telephone Call  Date of discharge and from where: [facility] 04/22/20  How have you been since you were released from the hospital? Pt states he still feels sick  Any questions or concerns? No  Items Reviewed:  Did the pt receive and understand the discharge instructions provided? Yes   Medications obtained and verified? N pt unable to do much talking stating he just didn't feel well  Other? No   Any new allergies since your discharge? No   Dietary orders reviewed? Yes pt states he is unable to eat solid foods  Do you have support at home? Yes  Jason Fitzgerald is there to help per pt Home Care and Equipment/Supplies: Were home health services ordered? not applicable If so, what is the name of the agency?   Has the agency set up a time to come to the patient's home? not applicable Were any new equipment or medical supplies ordered?  No What is the name of the medical supply agency?  Were you able to get the supplies/equipment? not applicable Do you have any questions related to the use of the equipment or supplies? No  Functional Questionnaire: (I = Independent and D = Dependent) ADLs: I  Bathing/Dressing- I  Meal Prep- D  Eating- I  Maintaining continence- I  Transferring/Ambulation- I uses a walker   Managing Meds- I  Follow up appointments reviewed:   PCP Hospital f/u appt confirmed? No    Specialist Hospital f/u appt confirmed? No    Are transportation arrangements needed? No   If their condition worsens, is the pt aware to call PCP or go to the Emergency Dept.? Yes  Was the patient provided with contact information for the PCP's office or ED? Yes  Was to pt encouraged to call back with questions or concerns? Yes

## 2020-04-29 ENCOUNTER — Inpatient Hospital Stay
Admission: EM | Admit: 2020-04-29 | Discharge: 2020-05-15 | DRG: 871 | Disposition: A | Discharge status: Hospice | Payer: Medicare Other | Attending: Internal Medicine | Admitting: Internal Medicine

## 2020-04-29 ENCOUNTER — Emergency Department: Payer: Medicare Other

## 2020-04-29 ENCOUNTER — Other Ambulatory Visit: Payer: Self-pay

## 2020-04-29 DIAGNOSIS — T508X5A Adverse effect of diagnostic agents, initial encounter: Secondary | ICD-10-CM | POA: Diagnosis not present

## 2020-04-29 DIAGNOSIS — N170 Acute kidney failure with tubular necrosis: Secondary | ICD-10-CM | POA: Diagnosis not present

## 2020-04-29 DIAGNOSIS — I130 Hypertensive heart and chronic kidney disease with heart failure and stage 1 through stage 4 chronic kidney disease, or unspecified chronic kidney disease: Secondary | ICD-10-CM | POA: Diagnosis present

## 2020-04-29 DIAGNOSIS — J9621 Acute and chronic respiratory failure with hypoxia: Secondary | ICD-10-CM | POA: Diagnosis not present

## 2020-04-29 DIAGNOSIS — J690 Pneumonitis due to inhalation of food and vomit: Secondary | ICD-10-CM

## 2020-04-29 DIAGNOSIS — N184 Chronic kidney disease, stage 4 (severe): Secondary | ICD-10-CM | POA: Diagnosis not present

## 2020-04-29 DIAGNOSIS — Z7189 Other specified counseling: Secondary | ICD-10-CM | POA: Diagnosis not present

## 2020-04-29 DIAGNOSIS — R0689 Other abnormalities of breathing: Secondary | ICD-10-CM | POA: Diagnosis not present

## 2020-04-29 DIAGNOSIS — N1831 Chronic kidney disease, stage 3a: Secondary | ICD-10-CM | POA: Diagnosis not present

## 2020-04-29 DIAGNOSIS — I499 Cardiac arrhythmia, unspecified: Secondary | ICD-10-CM | POA: Diagnosis not present

## 2020-04-29 DIAGNOSIS — G9341 Metabolic encephalopathy: Secondary | ICD-10-CM | POA: Diagnosis present

## 2020-04-29 DIAGNOSIS — J849 Interstitial pulmonary disease, unspecified: Secondary | ICD-10-CM | POA: Diagnosis present

## 2020-04-29 DIAGNOSIS — J449 Chronic obstructive pulmonary disease, unspecified: Secondary | ICD-10-CM | POA: Diagnosis present

## 2020-04-29 DIAGNOSIS — K7460 Unspecified cirrhosis of liver: Secondary | ICD-10-CM | POA: Diagnosis present

## 2020-04-29 DIAGNOSIS — I5022 Chronic systolic (congestive) heart failure: Secondary | ICD-10-CM | POA: Diagnosis not present

## 2020-04-29 DIAGNOSIS — M0579 Rheumatoid arthritis with rheumatoid factor of multiple sites without organ or systems involvement: Secondary | ICD-10-CM | POA: Diagnosis present

## 2020-04-29 DIAGNOSIS — E039 Hypothyroidism, unspecified: Secondary | ICD-10-CM | POA: Diagnosis present

## 2020-04-29 DIAGNOSIS — I5043 Acute on chronic combined systolic (congestive) and diastolic (congestive) heart failure: Secondary | ICD-10-CM | POA: Diagnosis not present

## 2020-04-29 DIAGNOSIS — E2740 Unspecified adrenocortical insufficiency: Secondary | ICD-10-CM | POA: Diagnosis not present

## 2020-04-29 DIAGNOSIS — F17200 Nicotine dependence, unspecified, uncomplicated: Secondary | ICD-10-CM | POA: Diagnosis present

## 2020-04-29 DIAGNOSIS — I480 Paroxysmal atrial fibrillation: Secondary | ICD-10-CM | POA: Diagnosis not present

## 2020-04-29 DIAGNOSIS — Z743 Need for continuous supervision: Secondary | ICD-10-CM | POA: Diagnosis not present

## 2020-04-29 DIAGNOSIS — I959 Hypotension, unspecified: Secondary | ICD-10-CM | POA: Diagnosis not present

## 2020-04-29 DIAGNOSIS — R6520 Severe sepsis without septic shock: Secondary | ICD-10-CM

## 2020-04-29 DIAGNOSIS — J440 Chronic obstructive pulmonary disease with acute lower respiratory infection: Secondary | ICD-10-CM | POA: Diagnosis present

## 2020-04-29 DIAGNOSIS — M549 Dorsalgia, unspecified: Secondary | ICD-10-CM | POA: Diagnosis present

## 2020-04-29 DIAGNOSIS — J8410 Pulmonary fibrosis, unspecified: Secondary | ICD-10-CM | POA: Diagnosis present

## 2020-04-29 DIAGNOSIS — G8929 Other chronic pain: Secondary | ICD-10-CM | POA: Diagnosis present

## 2020-04-29 DIAGNOSIS — E8809 Other disorders of plasma-protein metabolism, not elsewhere classified: Secondary | ICD-10-CM | POA: Diagnosis present

## 2020-04-29 DIAGNOSIS — N179 Acute kidney failure, unspecified: Secondary | ICD-10-CM

## 2020-04-29 DIAGNOSIS — R9431 Abnormal electrocardiogram [ECG] [EKG]: Secondary | ICD-10-CM | POA: Diagnosis present

## 2020-04-29 DIAGNOSIS — T502X5A Adverse effect of carbonic-anhydrase inhibitors, benzothiadiazides and other diuretics, initial encounter: Secondary | ICD-10-CM | POA: Diagnosis not present

## 2020-04-29 DIAGNOSIS — R053 Chronic cough: Secondary | ICD-10-CM | POA: Diagnosis present

## 2020-04-29 DIAGNOSIS — I5023 Acute on chronic systolic (congestive) heart failure: Secondary | ICD-10-CM | POA: Diagnosis present

## 2020-04-29 DIAGNOSIS — Z66 Do not resuscitate: Secondary | ICD-10-CM | POA: Diagnosis not present

## 2020-04-29 DIAGNOSIS — L89150 Pressure ulcer of sacral region, unstageable: Secondary | ICD-10-CM | POA: Diagnosis present

## 2020-04-29 DIAGNOSIS — D649 Anemia, unspecified: Secondary | ICD-10-CM | POA: Diagnosis present

## 2020-04-29 DIAGNOSIS — E785 Hyperlipidemia, unspecified: Secondary | ICD-10-CM | POA: Diagnosis not present

## 2020-04-29 DIAGNOSIS — M47816 Spondylosis without myelopathy or radiculopathy, lumbar region: Secondary | ICD-10-CM | POA: Diagnosis present

## 2020-04-29 DIAGNOSIS — E872 Acidosis: Secondary | ICD-10-CM | POA: Diagnosis not present

## 2020-04-29 DIAGNOSIS — K7030 Alcoholic cirrhosis of liver without ascites: Secondary | ICD-10-CM | POA: Diagnosis not present

## 2020-04-29 DIAGNOSIS — K219 Gastro-esophageal reflux disease without esophagitis: Secondary | ICD-10-CM | POA: Diagnosis present

## 2020-04-29 DIAGNOSIS — I426 Alcoholic cardiomyopathy: Secondary | ICD-10-CM | POA: Diagnosis present

## 2020-04-29 DIAGNOSIS — Z9114 Patient's other noncompliance with medication regimen: Secondary | ICD-10-CM | POA: Diagnosis not present

## 2020-04-29 DIAGNOSIS — I4891 Unspecified atrial fibrillation: Secondary | ICD-10-CM | POA: Diagnosis present

## 2020-04-29 DIAGNOSIS — F1020 Alcohol dependence, uncomplicated: Secondary | ICD-10-CM | POA: Diagnosis present

## 2020-04-29 DIAGNOSIS — E43 Unspecified severe protein-calorie malnutrition: Secondary | ICD-10-CM | POA: Diagnosis not present

## 2020-04-29 DIAGNOSIS — I2510 Atherosclerotic heart disease of native coronary artery without angina pectoris: Secondary | ICD-10-CM | POA: Diagnosis not present

## 2020-04-29 DIAGNOSIS — R109 Unspecified abdominal pain: Secondary | ICD-10-CM | POA: Diagnosis not present

## 2020-04-29 DIAGNOSIS — Z20822 Contact with and (suspected) exposure to covid-19: Secondary | ICD-10-CM | POA: Diagnosis present

## 2020-04-29 DIAGNOSIS — I110 Hypertensive heart disease with heart failure: Secondary | ICD-10-CM | POA: Diagnosis not present

## 2020-04-29 DIAGNOSIS — A419 Sepsis, unspecified organism: Secondary | ICD-10-CM

## 2020-04-29 DIAGNOSIS — K5900 Constipation, unspecified: Secondary | ICD-10-CM | POA: Diagnosis present

## 2020-04-29 DIAGNOSIS — I10 Essential (primary) hypertension: Secondary | ICD-10-CM | POA: Diagnosis not present

## 2020-04-29 DIAGNOSIS — L89120 Pressure ulcer of left upper back, unstageable: Secondary | ICD-10-CM | POA: Diagnosis present

## 2020-04-29 DIAGNOSIS — D631 Anemia in chronic kidney disease: Secondary | ICD-10-CM | POA: Diagnosis not present

## 2020-04-29 DIAGNOSIS — R1310 Dysphagia, unspecified: Secondary | ICD-10-CM | POA: Diagnosis present

## 2020-04-29 DIAGNOSIS — Z79899 Other long term (current) drug therapy: Secondary | ICD-10-CM

## 2020-04-29 DIAGNOSIS — N141 Nephropathy induced by other drugs, medicaments and biological substances: Secondary | ICD-10-CM | POA: Diagnosis not present

## 2020-04-29 DIAGNOSIS — D684 Acquired coagulation factor deficiency: Secondary | ICD-10-CM | POA: Diagnosis present

## 2020-04-29 DIAGNOSIS — E876 Hypokalemia: Secondary | ICD-10-CM | POA: Diagnosis present

## 2020-04-29 DIAGNOSIS — R0602 Shortness of breath: Secondary | ICD-10-CM | POA: Diagnosis not present

## 2020-04-29 DIAGNOSIS — K7290 Hepatic failure, unspecified without coma: Secondary | ICD-10-CM | POA: Diagnosis present

## 2020-04-29 DIAGNOSIS — I509 Heart failure, unspecified: Secondary | ICD-10-CM

## 2020-04-29 DIAGNOSIS — Z6823 Body mass index (BMI) 23.0-23.9, adult: Secondary | ICD-10-CM

## 2020-04-29 DIAGNOSIS — Z515 Encounter for palliative care: Secondary | ICD-10-CM | POA: Diagnosis not present

## 2020-04-29 DIAGNOSIS — I5084 End stage heart failure: Secondary | ICD-10-CM | POA: Diagnosis present

## 2020-04-29 DIAGNOSIS — Z91148 Patient's other noncompliance with medication regimen for other reason: Secondary | ICD-10-CM

## 2020-04-29 LAB — COMPREHENSIVE METABOLIC PANEL
ALT: 59 U/L — ABNORMAL HIGH (ref 0–44)
AST: 51 U/L — ABNORMAL HIGH (ref 15–41)
Albumin: 2.9 g/dL — ABNORMAL LOW (ref 3.5–5.0)
Alkaline Phosphatase: 195 U/L — ABNORMAL HIGH (ref 38–126)
Anion gap: 15 (ref 5–15)
BUN: 27 mg/dL — ABNORMAL HIGH (ref 8–23)
CO2: 26 mmol/L (ref 22–32)
Calcium: 8.4 mg/dL — ABNORMAL LOW (ref 8.9–10.3)
Chloride: 92 mmol/L — ABNORMAL LOW (ref 98–111)
Creatinine, Ser: 0.99 mg/dL (ref 0.61–1.24)
GFR, Estimated: >60 mL/min (ref >60)
Glucose, Bld: 124 mg/dL — ABNORMAL HIGH (ref 70–99)
Potassium: 3.4 mmol/L — ABNORMAL LOW (ref 3.5–5.1)
Sodium: 133 mmol/L — ABNORMAL LOW (ref 135–145)
Total Bilirubin: 5.2 mg/dL — ABNORMAL HIGH (ref 0.3–1.2)
Total Protein: 9.1 g/dL — ABNORMAL HIGH (ref 6.5–8.1)

## 2020-04-29 LAB — URINALYSIS, COMPLETE (UACMP) WITH MICROSCOPIC
Bilirubin Urine: NEGATIVE
Glucose, UA: NEGATIVE mg/dL
Hgb urine dipstick: NEGATIVE
Ketones, ur: NEGATIVE mg/dL
Leukocytes,Ua: NEGATIVE
Nitrite: NEGATIVE
Protein, ur: NEGATIVE mg/dL
Specific Gravity, Urine: 1.035 — ABNORMAL HIGH (ref 1.005–1.030)
pH: 5 (ref 5.0–8.0)

## 2020-04-29 LAB — CBC WITH DIFFERENTIAL/PLATELET
Abs Immature Granulocytes: 0.18 10*3/uL — ABNORMAL HIGH (ref 0.00–0.07)
Basophils Absolute: 0.1 10*3/uL (ref 0.0–0.1)
Basophils Relative: 0 %
Eosinophils Absolute: 0 10*3/uL (ref 0.0–0.5)
Eosinophils Relative: 0 %
HCT: 45.5 % (ref 39.0–52.0)
Hemoglobin: 15.5 g/dL (ref 13.0–17.0)
Immature Granulocytes: 1 %
Lymphocytes Relative: 9 %
Lymphs Abs: 1.6 10*3/uL (ref 0.7–4.0)
MCH: 27.2 pg (ref 26.0–34.0)
MCHC: 34.1 g/dL (ref 30.0–36.0)
MCV: 79.8 fL — ABNORMAL LOW (ref 80.0–100.0)
Monocytes Absolute: 0.7 10*3/uL (ref 0.1–1.0)
Monocytes Relative: 4 %
Neutro Abs: 15.4 10*3/uL — ABNORMAL HIGH (ref 1.7–7.7)
Neutrophils Relative %: 86 %
Platelets: 149 10*3/uL — ABNORMAL LOW (ref 150–400)
RBC: 5.7 MIL/uL (ref 4.22–5.81)
RDW: 15.1 % (ref 11.5–15.5)
WBC: 17.9 10*3/uL — ABNORMAL HIGH (ref 4.0–10.5)
nRBC: 0 % (ref 0.0–0.2)

## 2020-04-29 LAB — TROPONIN I (HIGH SENSITIVITY)
Troponin I (High Sensitivity): 20 ng/L — ABNORMAL HIGH (ref <18)
Troponin I (High Sensitivity): 19 ng/L — ABNORMAL HIGH (ref <18)

## 2020-04-29 LAB — BRAIN NATRIURETIC PEPTIDE: B Natriuretic Peptide: 1946.9 pg/mL — ABNORMAL HIGH (ref 0.0–100.0)

## 2020-04-29 LAB — URINE DRUG SCREEN, QUALITATIVE (ARMC ONLY)
Amphetamines, Ur Screen: NOT DETECTED
Barbiturates, Ur Screen: NOT DETECTED
Benzodiazepine, Ur Scrn: NOT DETECTED
Cannabinoid 50 Ng, Ur ~~LOC~~: NOT DETECTED
Cocaine Metabolite,Ur ~~LOC~~: POSITIVE — AB
MDMA (Ecstasy)Ur Screen: NOT DETECTED
Methadone Scn, Ur: NOT DETECTED
Opiate, Ur Screen: POSITIVE — AB
Phencyclidine (PCP) Ur S: NOT DETECTED
Tricyclic, Ur Screen: NOT DETECTED

## 2020-04-29 LAB — TSH: TSH: 7.981 u[IU]/mL — ABNORMAL HIGH (ref 0.350–4.500)

## 2020-04-29 LAB — SARS CORONAVIRUS 2 (TAT 6-24 HRS): SARS Coronavirus 2: NEGATIVE

## 2020-04-29 LAB — PHOSPHORUS: Phosphorus: 2.6 mg/dL (ref 2.5–4.6)

## 2020-04-29 LAB — MAGNESIUM: Magnesium: 2.3 mg/dL (ref 1.7–2.4)

## 2020-04-29 LAB — LIPASE, BLOOD: Lipase: 29 U/L (ref 11–51)

## 2020-04-29 LAB — LACTIC ACID, PLASMA
Lactic Acid, Venous: 2.4 mmol/L (ref 0.5–1.9)
Lactic Acid, Venous: 3.2 mmol/L (ref 0.5–1.9)

## 2020-04-29 LAB — PROCALCITONIN: Procalcitonin: 3.66 ng/mL

## 2020-04-29 MED ORDER — LORAZEPAM 1 MG PO TABS
1.0000 mg | ORAL_TABLET | ORAL | Status: AC | PRN
Start: 2020-04-29 — End: 2020-05-02

## 2020-04-29 MED ORDER — DAPAGLIFLOZIN PROPANEDIOL 5 MG PO TABS
10.0000 mg | ORAL_TABLET | Freq: Every day | ORAL | Status: DC
  Administered 2020-04-30 – 2020-05-05 (×6): 10 mg via ORAL
  Filled 2020-04-29 (×8): qty 2

## 2020-04-29 MED ORDER — LORAZEPAM 2 MG/ML IJ SOLN
1.0000 mg | INTRAMUSCULAR | Status: AC | PRN
  Filled 2020-04-29: qty 1

## 2020-04-29 MED ORDER — ATORVASTATIN CALCIUM 20 MG PO TABS
40.0000 mg | ORAL_TABLET | Freq: Every day | ORAL | Status: DC
  Administered 2020-04-29 – 2020-05-13 (×14): 40 mg via ORAL
  Filled 2020-04-29 (×15): qty 2

## 2020-04-29 MED ORDER — SODIUM CHLORIDE 0.9 % IV SOLN: 500.0000 mg | INTRAVENOUS | Status: DC

## 2020-04-29 MED ORDER — THIAMINE HCL 100 MG PO TABS
100.0000 mg | ORAL_TABLET | Freq: Every day | ORAL | Status: DC
  Administered 2020-04-30 – 2020-05-13 (×12): 100 mg via ORAL
  Filled 2020-04-29 (×14): qty 1

## 2020-04-29 MED ORDER — THIAMINE HCL 100 MG/ML IJ SOLN
100.0000 mg | Freq: Every day | INTRAMUSCULAR | Status: DC
  Administered 2020-04-29 – 2020-05-02 (×2): 100 mg via INTRAVENOUS
  Filled 2020-04-29 (×7): qty 2

## 2020-04-29 MED ORDER — SODIUM CHLORIDE 0.9 % IV SOLN
2.0000 g | Freq: Once | INTRAVENOUS | Status: AC
  Administered 2020-04-29: 2 g via INTRAVENOUS
  Filled 2020-04-29: qty 2

## 2020-04-29 MED ORDER — FUROSEMIDE 10 MG/ML IJ SOLN
40.0000 mg | Freq: Once | INTRAMUSCULAR | Status: AC
  Administered 2020-04-29: 40 mg via INTRAVENOUS
  Filled 2020-04-29: qty 4

## 2020-04-29 MED ORDER — SODIUM CHLORIDE 0.9 % IV SOLN
100.0000 mg | Freq: Two times a day (BID) | INTRAVENOUS | Status: DC
  Administered 2020-04-29 – 2020-04-30 (×2): 100 mg via INTRAVENOUS
  Filled 2020-04-29 (×4): qty 100

## 2020-04-29 MED ORDER — VANCOMYCIN HCL IN DEXTROSE 1-5 GM/200ML-% IV SOLN
1000.0000 mg | Freq: Once | INTRAVENOUS | Status: AC
  Administered 2020-04-29: 1000 mg via INTRAVENOUS
  Filled 2020-04-29: qty 200

## 2020-04-29 MED ORDER — ENOXAPARIN SODIUM 40 MG/0.4ML ~~LOC~~ SOLN
40.0000 mg | SUBCUTANEOUS | Status: DC
  Administered 2020-04-29: 40 mg via SUBCUTANEOUS
  Filled 2020-04-29: qty 0.4

## 2020-04-29 MED ORDER — LACTATED RINGERS IV SOLN: INTRAVENOUS | Status: DC

## 2020-04-29 MED ORDER — ACETAMINOPHEN 325 MG PO TABS
325.0000 mg | ORAL_TABLET | Freq: Four times a day (QID) | ORAL | Status: AC | PRN
  Administered 2020-04-30: 325 mg via ORAL
  Filled 2020-04-29: qty 1

## 2020-04-29 MED ORDER — ONDANSETRON HCL 4 MG/2ML IJ SOLN
4.0000 mg | Freq: Once | INTRAMUSCULAR | Status: AC
  Administered 2020-04-29: 4 mg via INTRAVENOUS
  Filled 2020-04-29: qty 2

## 2020-04-29 MED ORDER — ACETAMINOPHEN 650 MG RE SUPP: 325.0000 mg | Freq: Four times a day (QID) | RECTAL | Status: AC | PRN

## 2020-04-29 MED ORDER — ADULT MULTIVITAMIN W/MINERALS CH
1.0000 | ORAL_TABLET | Freq: Every day | ORAL | Status: DC
  Administered 2020-04-29: 1 via ORAL
  Filled 2020-04-29: qty 1

## 2020-04-29 MED ORDER — MORPHINE SULFATE (PF) 4 MG/ML IV SOLN
4.0000 mg | Freq: Once | INTRAVENOUS | Status: AC
Start: 2020-04-29 — End: 2020-04-29
  Administered 2020-04-29: 4 mg via INTRAVENOUS
  Filled 2020-04-29: qty 1

## 2020-04-29 MED ORDER — METOPROLOL TARTRATE 5 MG/5ML IV SOLN
5.0000 mg | INTRAVENOUS | Status: DC | PRN
  Administered 2020-04-29: 5 mg via INTRAVENOUS
  Filled 2020-04-29 (×2): qty 5

## 2020-04-29 MED ORDER — FUROSEMIDE 10 MG/ML IJ SOLN: 40.0000 mg | Freq: Once | INTRAMUSCULAR | Status: DC

## 2020-04-29 MED ORDER — ONDANSETRON HCL 4 MG PO TABS: 4.0000 mg | ORAL_TABLET | Freq: Four times a day (QID) | ORAL | Status: DC | PRN

## 2020-04-29 MED ORDER — SACUBITRIL-VALSARTAN 97-103 MG PO TABS: 1.0000 | ORAL_TABLET | Freq: Two times a day (BID) | ORAL | Status: DC

## 2020-04-29 MED ORDER — METOPROLOL SUCCINATE ER 50 MG PO TB24
25.0000 mg | ORAL_TABLET | Freq: Every day | ORAL | Status: DC
  Filled 2020-04-29: qty 1

## 2020-04-29 MED ORDER — FOLIC ACID 1 MG PO TABS
1.0000 mg | ORAL_TABLET | Freq: Every day | ORAL | Status: DC
  Administered 2020-04-29 – 2020-05-13 (×14): 1 mg via ORAL
  Filled 2020-04-29 (×15): qty 1

## 2020-04-29 MED ORDER — MIDODRINE HCL 5 MG PO TABS
10.0000 mg | ORAL_TABLET | Freq: Three times a day (TID) | ORAL | Status: DC
  Administered 2020-04-30 – 2020-05-13 (×36): 10 mg via ORAL
  Filled 2020-04-29 (×37): qty 2

## 2020-04-29 MED ORDER — METRONIDAZOLE IN NACL 5-0.79 MG/ML-% IV SOLN
500.0000 mg | Freq: Once | INTRAVENOUS | Status: AC
  Administered 2020-04-29: 500 mg via INTRAVENOUS
  Filled 2020-04-29: qty 100

## 2020-04-29 MED ORDER — IOHEXOL 300 MG/ML  SOLN
100.0000 mL | Freq: Once | INTRAMUSCULAR | Status: AC | PRN
  Administered 2020-04-29: 100 mL via INTRAVENOUS

## 2020-04-29 MED ORDER — METOPROLOL TARTRATE 5 MG/5ML IV SOLN: 5.0000 mg | INTRAVENOUS | Status: DC | PRN

## 2020-04-29 MED ORDER — ADULT MULTIVITAMIN W/MINERALS CH
1.0000 | ORAL_TABLET | Freq: Every day | ORAL | Status: DC
  Administered 2020-04-29 – 2020-05-13 (×12): 1 via ORAL
  Filled 2020-04-29 (×14): qty 1

## 2020-04-29 MED ORDER — SODIUM CHLORIDE 0.9 % IV SOLN
1.0000 g | INTRAVENOUS | Status: DC
  Administered 2020-04-30: 1 g via INTRAVENOUS
  Filled 2020-04-29: qty 10

## 2020-04-29 MED ORDER — METOPROLOL SUCCINATE ER 25 MG PO TB24
25.0000 mg | ORAL_TABLET | Freq: Every day | ORAL | Status: DC
  Administered 2020-04-30: 25 mg via ORAL
  Filled 2020-04-29: qty 1

## 2020-04-29 MED ORDER — ONDANSETRON HCL 4 MG/2ML IJ SOLN
4.0000 mg | Freq: Four times a day (QID) | INTRAMUSCULAR | Status: DC | PRN
  Administered 2020-05-01 – 2020-05-04 (×2): 4 mg via INTRAVENOUS
  Filled 2020-04-29 (×3): qty 2

## 2020-04-29 NOTE — H&P (Addendum)
History and Physical   Jason Fitzgerald IWL:[id number] DOB: [birth year] DOA: 04/29/2020  PCP: Jason Sizer, MD  Outpatient Specialists: Dr. Raul Fitzgerald, pulmonology Patient coming from: home via EMS  I have personally briefly reviewed patient's old medical records in [place].  Chief Concern: Shortness of breath  HPI: Jason Fitzgerald is a 70 y.o. male with medical history significant for hypertension, interstitial lung disease, heart failure reduced ejection fraction, hyperlipidemia, history of atrial fibrillation, presents emergency department by EMS for shortness of breath.  Jason Fitzgerald states that his shortness of breath is worse with exertion and laying flat.  He reports that shortness of breath started this morning.  He denies fever, chills, nausea, vomiting, diarrhea, chest pain.  He does endorse coughing of white phlegm.  Denies weight gain.  He denies changes to diet. He states this feels like his previous episodes of heart failure exacerbation.  He states to me that he has not had his medication for 1 to 2 weeks.  He states he ran out.  He does endorse to me that he drinks alcohol daily and last week and he had a party/guess over.  Social history: He lives by himself.  He endorses tobacco use.  He drinks alcohol, at least 1/5/day.  He uses cocaine.  Denies IV drug use.  ROS: Constitutional: no weight change, no fever ENT/Mouth: no sore throat, no rhinorrhea Eyes: no eye pain, no vision changes Cardiovascular: no chest pain, + dyspnea,  no edema, + palpitations Respiratory: no cough, no sputum, no wheezing Gastrointestinal: no nausea, no vomiting, no diarrhea, no constipation Genitourinary: no urinary incontinence, no dysuria, no hematuria Musculoskeletal: no arthralgias, no myalgias Skin: no skin lesions, no pruritus, Neuro: + weakness, no loss of consciousness, no syncope Psych: no anxiety, no depression, no decrease appetite Heme/Lymph: no bruising, no bleeding  ED  Course: Discussed with ED provider, patient requiring hospitalization due to heart failure exacerbation.  Vitals in the emergency department was remarkable for temperature of 98.4, respiration rate of 27, heart rate of 92, initially 115, blood pressure 214/161.  VS status post metoprolol 5 mg IVP, ondansetron 4 mg IV, cefepime, metronidazole, per ED provider.  Labs in the emergency department was remarkable for BNP of 1947, troponin of 20, lactic acid initially 2.4, Covid was pending, pro-Cal 3.66.  Assessment/Plan  Principal Problem:   Shortness of breath Active Problems:   Arteriosclerosis of coronary artery   HTN (hypertension)   Interstitial lung disease (HCC)   Chronic cough   Degenerative arthritis of lumbar spine   Chronic systolic CHF (congestive heart failure) (HCC)   AF (paroxysmal atrial fibrillation) (HCC)   Rheumatoid arthritis involving multiple sites with positive rheumatoid factor (HCC)   Fibrosis, pulmonary, interstitial, diffuse (HCC)   GERD (gastroesophageal reflux disease)   COPD (chronic obstructive pulmonary disease) (HCC)   CHF (congestive heart failure) (HCC)   Anemia   Atrial fibrillation (HCC)   Non compliance w medication regimen   DNR (do not resuscitate)   # Shortness of breath - query volume overload in setting of heart failure with EF < 20% # Heart failure reduced ejection fraction  -Patient states he ran out of medications since discharge from the hospital -Patient had guest over the weekend and has been drinking etoh -Patient did not readily share the amount of etoh he consumes daily, however he endorsed that he drinks about 1/5 of liquor per day with me and told ED provider, he drinks about 1-2 beers per day. -He said  to me that he does know he needs to work on his alcohol intake -Lasix 40 mg IV once, primary a.m. team to reassess for further need of Lasix -Strict I's and O's -Resumed Farxiga10 mg daily today, 04/29/20,  -Entresto not resumed  due to patient having hypotension  # Heart failure reduced ejection fraction-resumed metoprolol succinate 25 mg daily for 04/30/2020 -Status post metoprolol tartrate injection 5 mg once per EDP -Patient is DNR and has refused a LifeVest   # Atrial fibrillation-patient is not on anticoagulation as patient chronically drinks alcohol and this would increase his risk of bleeding -Patient was previously on Xarelto -Patient is not a candidate for amiodarone as he has severe pulmonary fibrosis  # Meets sepsis criteria, source is likely pneumonia due to elevated pro-Cal -Pro-Cal was elevated -Elevated lactic acid, elevated respiration rate, elevated heart rate, elevated WBC at 17.9 -Status post broad-spectrum antibiotic, cefepime, metronidazole, vancomycin per ED provider -I initiated pneumonia antibiotic, ceftriaxone 1 g IV starting 04/30/2020 as patient received cefepime today already and doxycycline 100 mg IV every 12 hours start for today, 04/29/2020 -Status post blood cultures x2, UA and it is pending -Patient cannot have fluid due to heart failure exacerbation -Midodrine 10 mg 3 times daily scheduled dosing ordered to maintain pressure support  #  Alcohol dependence-CIWA protocol initiated, thiamine, folate  # Cocaine positive-patient is not endorsing any chest pain at this time -As he is cocaine positive and likely used cocaine over the weekend when he had guests over, I will hold beta-blockade at this time -His home metoprolol succinate of 25 mg daily will be resumed for 04/30/2020 -If needed, we will use carvedilol  -Discussed with pharmacy and there does not seem to be a drug interaction with midodrine and cocaine  # Prolonged QT-patient has had prolonged QT before however this is not consistent on all EKG I suspect this is secondary to his alcohol use and cocaine use -We will avoid QT prolonging agents -Change azithromycin to doxycycline 100 mg q12h IV  # Hyperlipidemia-atorvastatin 40  mg daily resumed  # Interstitial lung disease-outpatient follow-up  # Elevated troponin-suspect multifactorial, including secondary to heart failure exacerbation and recent cocaine use -Low clinical suspicion for ACS as patient does not have chest pain  # Counseling given for alcohol cessation-patient endorses understanding  Fall precautions  Chart reviewed.   Echo 04/19/2020: Left ventricular ejection fraction, less than 20%, left ventricle has severely decreased function, left ventricle demonstrates global hypokinesis.  Left ventricular internal cavity size was severely dilated.  Mild concentric left ventricular hypertrophy.  Grade 2 diastolic dysfunction.  Left atrial valve is severely dilated.  Right atrial valve is severely dilated.  The mitral valve is myxomatous.  Hospitalization from 04/18/2020 to 04/22/2020: Heart failure exacerbation and chest pain.  Was found to have A. fib with RVR.  He is not a candidate for amiodarone due to severe pulmonary fibrosis and as he is an alcoholic he cannot be on anticoagulation.  At that time a LifeVest was recommended for patient and he refused.  He maintain DNR at that time.  On discharge Entresto and farxiga was held due to low SBP's and elevated serum creatinine.  He was also treated for AKI with metabolic acidosis.  DVT prophylaxis: TED hose Code Status: DNR Diet: Heart healthy Family Communication: No Disposition Plan: Pending clinical course Consults called: None at this time Admission status: Progressive cardiac, labs, with telemetry  Past Medical History:  Diagnosis Date  . CHF (congestive heart failure) (  HCC)   . Chronic cough   . COPD (chronic obstructive pulmonary disease) ([place])   . Elevated liver function tests   . Emphysema of lung ([place])   . Fibrosis, pulmonary, interstitial, diffuse ([place])   . GERD (gastroesophageal reflux disease)   . History of cocaine abuse ([place])   . Mediastinal lymphadenopathy   . Pulmonary fibrosis ([place])    . Right inguinal hernia    Past Surgical History:  Procedure Laterality Date  . COLONOSCOPY WITH PROPOFOL N/A 08/19/2018   Procedure: COLONOSCOPY WITH PROPOFOL;  Surgeon: Virgel Manifold, MD;  Location: [facility] ENDOSCOPY;  Service: Endoscopy;  Laterality: N/A;  . HERNIA REPAIR Right 1973   open  . INGUINAL HERNIA REPAIR Right 11/08/2014   Procedure: LAPAROSCOPIC RIGHT INGUINAL HERNIA REPAIR;  Surgeon: Hubbard Robinson, MD;  Location: [facility] ORS;  Service: General;  Laterality: Right;  . knee arthroscopy Right 1972  . RIGHT/LEFT HEART CATH AND CORONARY ANGIOGRAPHY Right 03/05/2017   Procedure: RIGHT/LEFT HEART CATH AND CORONARY ANGIOGRAPHY;  Surgeon: Dionisio David, MD;  Location: [facility];  Service: Cardiovascular;  Laterality: Right;   Social History:  reports that he has never smoked. He has never used smokeless tobacco. He reports previous alcohol use. He reports previous drug use. Drug: Cocaine.  No Known Allergies Family History  Problem Relation Age of Onset  . Diabetes Mother   . Cancer Father   . AAA (abdominal aortic aneurysm) Brother    Family history: Family history reviewed and not pertinent  Prior to Admission medications   Medication Sig Start Date End Date Taking? Authorizing Provider  acetaminophen (TYLENOL) 325 MG tablet Take 2 tablets (650 mg total) by mouth every 4 (four) hours as needed for headache or mild pain. 04/22/20   Debbe Odea, MD  atorvastatin (LIPITOR) 40 MG tablet Take 1 tablet (40 mg total) by mouth daily. 04/22/20   Debbe Odea, MD  folic acid (FOLVITE) 1 MG tablet Take 1 tablet (1 mg total) by mouth daily. 04/22/20   Debbe Odea, MD  furosemide (LASIX) 20 MG tablet Take 1 tablet (20 mg total) by mouth daily as needed. Take if your weight rises by 3 lb in 2 days 04/22/20 04/22/21  Debbe Odea, MD  metoprolol succinate (TOPROL-XL) 25 MG 24 hr tablet Take 1 tablet (25 mg total) by mouth daily. 04/22/20   Debbe Odea, MD  Multiple  Vitamin (MULTIVITAMIN WITH MINERALS) TABS tablet Take 1 tablet by mouth daily. 04/22/20   Debbe Odea, MD  potassium chloride SA (KLOR-CON) 20 MEQ tablet Take 1 tablet (20 mEq total) by mouth daily as needed. Take only when taking Lasix 04/22/20   Debbe Odea, MD  thiamine 100 MG tablet Take 1 tablet (100 mg total) by mouth daily. 04/22/20   Debbe Odea, MD   Physical Exam: Vitals:   04/29/20 4081 04/29/20 0928 04/29/20 0930 04/29/20 1017  BP:  110/84 91/68   Pulse:  61 (!) 35 99  Resp:  18    Temp:  98.4 F (36.9 C)    TempSrc:  Oral    SpO2:  96% 100%   Weight: 74.8 kg     Height: 6' (1.829 m)      Constitutional: appears older than chronological age, cachectic, malnourished, frail, NAD, calm Eyes: PERRL, lids and conjunctivae normal ENMT: Mucous membranes are moist. Posterior pharynx clear of any exudate or lesions. Age-appropriate dentition. Hearing appropriate Neck: normal, supple, no masses, no thyromegaly Respiratory: clear to auscultation bilaterally, no wheezing,  no crackles. Normal respiratory effort. No accessory muscle use.  Cardiovascular: Regular rate and rhythm, no murmurs / rubs / gallops. No extremity edema. 2+ pedal pulses. No carotid bruits.  Abdomen: no tenderness, no masses palpated, no hepatosplenomegaly. Bowel sounds positive.  Musculoskeletal: no clubbing / cyanosis. No joint deformity upper and lower extremities. Good ROM, no contractures, no atrophy. Normal muscle tone.  Skin: no rashes, lesions, ulcers. No induration Neurologic: Sensation intact. Strength 5/5 in all 4.  Psychiatric: Normal judgment and insight. Alert and oriented x 3. Normal mood.   EKG: independently reviewed, showing   Chest x-ray on Admission: I personally reviewed and I agree with radiologist reading as below.  CT Abdomen Pelvis W Contrast  Result Date: 04/29/2020 CLINICAL DATA:  Acute nonlocalized abdominal pain EXAM: CT ABDOMEN AND PELVIS WITH CONTRAST TECHNIQUE: Multidetector CT  imaging of the abdomen and pelvis was performed using the standard protocol following bolus administration of intravenous contrast. CONTRAST:  135mL OMNIPAQUE IOHEXOL 300 MG/ML  SOLN COMPARISON:  07/20/2013 FINDINGS: Lower chest: Small bilateral pleural effusion. Fibrosis with honeycombing is seen diffusely at the lung bases. Heart size is within normal limits. Gynecomastia. Hepatobiliary: Heterogeneous density of the liver with nutmeg appearance. Subcapsular right hepatic cyst. No solid masslike finding.Calcification at the gallbladder fossa which appears extraluminal and is chronic. No evidence of biliary inflammation or obstruction. Pancreas: Unremarkable. Spleen: Unremarkable. Adrenals/Urinary Tract: Negative adrenals. No hydronephrosis or stone. Small renal cystic densities. Unremarkable bladder. Stomach/Bowel:  No obstruction. No visible inflammation of bowel. Vascular/Lymphatic: No acute vascular abnormality. Scattered atheromatous calcification of the aorta. No mass or adenopathy. Reproductive:Partially covered right hydrocele Other: No ascites or pneumoperitoneum.  Hernia repair using mesh. Musculoskeletal: No acute abnormalities.  Lumbar spine degeneration. IMPRESSION: 1. No acute intra-abdominal finding. 2. Heterogeneous liver perfusion which could be from fibrosis or right heart failure. 3. Small pleural effusions. 4.  Aortic Atherosclerosis (ICD10-I70.0). Electronically Signed   By: Monte Fantasia M.D.   On: 04/29/2020 11:31   DG Chest Port 1 View  Result Date: 04/29/2020 CLINICAL DATA:  Shortness of breath and pain. EXAM: PORTABLE CHEST 1 VIEW COMPARISON:  04/18/2020 FINDINGS: Stable cardiomediastinal contours. Extensive fibrotic lung disease is identified within both lungs. This is most advanced within the mid and lower lung zones. No definite superimposed pulmonary opacity. There is a suggestion of subcutaneous gas within the soft tissues in the left supraclavicular region. IMPRESSION: 1.  Extensive fibrotic lung disease. 2. No superimposed acute cardiopulmonary abnormality. Electronically Signed   By: Kerby Moors M.D.   On: 04/29/2020 10:43   Labs on Admission: I have personally reviewed following labs  CBC: Recent Labs  Lab 04/29/20 0934  WBC 17.9*  NEUTROABS 15.4*  HGB 15.5  HCT 45.5  MCV 79.8*  PLT 161*   Basic Metabolic Panel: Recent Labs  Lab 04/29/20 0934  NA 133*  K 3.4*  CL 92*  CO2 26  GLUCOSE 124*  BUN 27*  CREATININE 0.99  CALCIUM 8.4*   GFR: Estimated Creatinine Clearance: 75.6 mL/min (by C-G formula based on SCr of 0.99 mg/dL). Liver Function Tests: Recent Labs  Lab 04/29/20 0934  AST 51*  ALT 59*  ALKPHOS 195*  BILITOT 5.2*  PROT 9.1*  ALBUMIN 2.9*   Recent Labs  Lab 04/29/20 0934  LIPASE 29   Urine analysis:    Component Value Date/Time   COLORURINE AMBER (A) 12/17/2018 1437   APPEARANCEUR HAZY (A) 12/17/2018 1437   LABSPEC 1.014 12/17/2018 1437   PHURINE 6.0 12/17/2018 1437  GLUCOSEU NEGATIVE 12/17/2018 1437   HGBUR NEGATIVE 12/17/2018 1437   BILIRUBINUR NEGATIVE 12/17/2018 1437   BILIRUBINUR Negative 12/08/2017 0947   KETONESUR 20 (A) 12/17/2018 1437   PROTEINUR 30 (A) 12/17/2018 1437   UROBILINOGEN 1.0 12/08/2017 0947   NITRITE NEGATIVE 12/17/2018 1437   LEUKOCYTESUR NEGATIVE 12/17/2018 1437   CRITICAL CARE Performed by: Briant Cedar Jadarian Mckay  Total critical care time: 40 minutes  Critical care time was exclusive of separately billable procedures and treating other patients.  Critical care was necessary to treat or prevent imminent or life-threatening deterioration. Circulatory failure. Cardiac failure.  Critical care was time spent personally by me on the following activities: development of treatment plan with patient and/or surrogate as well as nursing, discussions with consultants, evaluation of patient's response to treatment, examination of patient, obtaining history from patient or surrogate, ordering and  performing treatments and interventions, ordering and review of laboratory studies, ordering and review of radiographic studies, pulse oximetry and re-evaluation of patient's condition.  Alexes Menchaca N Kongmeng Santoro D.O. [facility]  If 7PM-7AM, please contact overnight-coverage provider If 7AM-7PM, please contact day coverage provider [email or web address]  04/29/2020, 1:31 PM

## 2020-04-29 NOTE — Progress Notes (Signed)
[place] ED11 [employer] [facility]) Hospital Liaison note:  This is a pending outpatient Palliative Care patient. Will continue to follow for disposition.  Please call with any outpatient palliative questions or concerns.  Thank you, Lorelee Market, LPN [facility] Liaison [phone]

## 2020-04-29 NOTE — ED Provider Notes (Signed)
[facility] Emergency Department Provider Note   ____________________________________________   Event Date/Time   First MD Initiated Contact with Patient 04/29/20 [phone]     (approximate)  I have reviewed the triage vital signs and the nursing notes.   HISTORY  Chief Complaint Weakness   HPI Jason Fitzgerald is a 70 y.o. male with possible history of hypertension, hyperlipidemia, interstitial lung disease, COPD, CHF, atrial fibrillation, and rheumatoid arthritis who presents to the ED for weakness and pain.  Patient reports that he has been feeling bad ever since he left the hospital 1 week ago, when he was diagnosed with A. fib with RVR.  He states he has not been able to get his medication since then but, because his sister was supposed to pick them up but did not do so.  He has been feeling increasingly weak with difficulty breathing and swelling in his legs.  He additionally endorses sharp pain in his chest and the left upper quadrant of his abdomen that has been present intermittently for the past week.  He denies any associated nausea, vomiting, fevers, cough, dysuria, or hematuria.        Past Medical History:  Diagnosis Date  . CHF (congestive heart failure) ([place])   . Chronic cough   . COPD (chronic obstructive pulmonary disease) ([place])   . Elevated liver function tests   . Emphysema of lung ([place])   . Fibrosis, pulmonary, interstitial, diffuse ([place])   . GERD (gastroesophageal reflux disease)   . History of cocaine abuse ([place])   . Mediastinal lymphadenopathy   . Pulmonary fibrosis ([place])   . Right inguinal hernia     Patient Active Problem List   Diagnosis Date Noted  . DNR (do not resuscitate) 04/22/2020  . Atrial fibrillation with rapid ventricular response ([place]) 04/18/2020  . Non compliance w medication regimen   . Atrial fibrillation ([place]) 12/09/2019  . Atrial fibrillation with RVR ([place]) 12/08/2019  . Slurred speech 12/08/2019  . Chest  pain 12/08/2019  . Elevated troponin 12/08/2019  . Acute CHF (congestive heart failure) ([place]) 08/05/2019  . Diarrhea   . Dizziness 12/17/2018  . Hypotension 12/17/2018  . Hypophosphatemia 12/17/2018  . Hypomagnesemia 12/17/2018  . Positive D-dimer 12/17/2018  . Abnormal LFTs (liver function tests) 12/17/2018  . Anemia 12/17/2018  . Fibrosis, pulmonary, interstitial, diffuse ([place])   . GERD (gastroesophageal reflux disease)   . COPD (chronic obstructive pulmonary disease) ([place])   . CHF (congestive heart failure) ([place])   . Encounter for screening colonoscopy   . Polyp of transverse colon   . Internal hemorrhoids   . Hypokalemia 02/03/2018  . History of alcoholism ([place]) 12/08/2017  . Rheumatoid arthritis involving multiple sites with positive rheumatoid factor ([place]) 10/13/2017  . Chronic systolic CHF (congestive heart failure) ([place]) 03/12/2017  . AF (paroxysmal atrial fibrillation) ([place]) 03/12/2017  . Arthritis of left knee 08/01/2016  . 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 Chronic cough 05/29/2015  . CAFL (chronic airflow limitation) ([place]) 10/24/2014  . Arteriosclerosis of coronary artery 10/24/2014  . History of fundoplication 31/51/7616  . HTN (hypertension) 10/24/2014  . LAD (lymphadenopathy), mediastinal 07/13/2013  . Interstitial lung disease ([place]) 06/19/2013  . 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 COLONOSCOPY WITH PROPOFOL N/A 08/19/2018   Procedure: COLONOSCOPY WITH PROPOFOL;  Surgeon: Virgel Manifold, MD;  Location: [facility] ENDOSCOPY;  Service: Endoscopy;  Laterality: N/A;  . HERNIA REPAIR Right  1973   open  . INGUINAL HERNIA REPAIR Right 11/08/2014   Procedure: LAPAROSCOPIC RIGHT INGUINAL HERNIA REPAIR;  Surgeon: Hubbard Robinson, MD;  Location: [facility] ORS;  Service: General;  Laterality: Right;  . knee arthroscopy Right 1972  . RIGHT/LEFT HEART CATH AND CORONARY ANGIOGRAPHY Right  03/05/2017   Procedure: RIGHT/LEFT HEART CATH AND CORONARY ANGIOGRAPHY;  Surgeon: Dionisio David, MD;  Location: [facility];  Service: Cardiovascular;  Laterality: Right;    Prior to Admission medications   Medication Sig Start Date End Date Taking? Authorizing Provider  acetaminophen (TYLENOL) 325 MG tablet Take 2 tablets (650 mg total) by mouth every 4 (four) hours as needed for headache or mild pain. 04/22/20   Debbe Odea, MD  atorvastatin (LIPITOR) 40 MG tablet Take 1 tablet (40 mg total) by mouth daily. 04/22/20   Debbe Odea, MD  folic acid (FOLVITE) 1 MG tablet Take 1 tablet (1 mg total) by mouth daily. 04/22/20   Debbe Odea, MD  furosemide (LASIX) 20 MG tablet Take 1 tablet (20 mg total) by mouth daily as needed. Take if your weight rises by 3 lb in 2 days 04/22/20 04/22/21  Debbe Odea, MD  metoprolol succinate (TOPROL-XL) 25 MG 24 hr tablet Take 1 tablet (25 mg total) by mouth daily. 04/22/20   Debbe Odea, MD  Multiple Vitamin (MULTIVITAMIN WITH MINERALS) TABS tablet Take 1 tablet by mouth daily. 04/22/20   Debbe Odea, MD  potassium chloride SA (KLOR-CON) 20 MEQ tablet Take 1 tablet (20 mEq total) by mouth daily as needed. Take only when taking Lasix 04/22/20   Debbe Odea, MD  thiamine 100 MG tablet Take 1 tablet (100 mg total) by mouth daily. 04/22/20   Debbe Odea, MD    Allergies Patient has no known allergies.  Family History  Problem Relation Age of Onset  . Diabetes Mother   . Cancer Father   . AAA (abdominal aortic aneurysm) Brother     Social History Social History   Tobacco Use  . Smoking status: Never Smoker  . Smokeless tobacco: Never Used  Vaping Use  . Vaping Use: Never used  Substance Use Topics  . Alcohol use: Not Currently    Alcohol/week: 0.0 standard drinks    Comment: 2 quarts a week, he quit again 12/2018  . Drug use: Not Currently    Types: Cocaine    Comment: as an young adult     Review of Systems  Constitutional: No  fever/chills Eyes: No visual changes. ENT: No sore throat. Cardiovascular: Positive for chest pain. Respiratory: Positive for shortness of breath. Gastrointestinal: Positive for abdominal pain.  No nausea, no vomiting.  No diarrhea.  No constipation. Genitourinary: Negative for dysuria. Musculoskeletal: Negative for back pain. Skin: Negative for rash. Neurological: Negative for headaches, focal weakness or numbness.  ____________________________________________   PHYSICAL EXAM:  VITAL SIGNS: ED Triage Vitals  Enc Vitals Group     BP      Pulse      Resp      Temp      Temp src      SpO2      Weight      Height      Head Circumference      Peak Flow      Pain Score      Pain Loc      Pain Edu?      Excl. in [place]?     Constitutional: Alert and oriented. Eyes: Conjunctivae  are normal. Head: Atraumatic. Nose: No congestion/rhinnorhea. Mouth/Throat: Mucous membranes are moist. Neck: Normal ROM Cardiovascular: Tachycardic, irregularly irregular rhythm. Grossly normal heart sounds.  2+ radial pulses bilaterally. Respiratory: Tachypneic with increased respiratory effort and crackles throughout. Gastrointestinal: Soft and tender to palpation in the epigastrium with no rebound or guarding. No distention. Genitourinary: deferred Musculoskeletal: No lower extremity tenderness, 2+ pitting edema to knees bilaterally. Neurologic:  Normal speech and language. No gross focal neurologic deficits are appreciated. Skin:  Skin is warm, dry and intact. No rash noted. Psychiatric: Mood and affect are normal. Speech and behavior are normal.  ____________________________________________   LABS (all labs ordered are listed, but only abnormal results are displayed)  Labs Reviewed  LACTIC ACID, PLASMA - Abnormal; Notable for the following components:      Result Value   Lactic Acid, Venous 2.4 (*)    All other components within normal limits  CBC WITH DIFFERENTIAL/PLATELET - Abnormal;  Notable for the following components:   WBC 17.9 (*)    MCV 79.8 (*)    Platelets 149 (*)    Neutro Abs 15.4 (*)    Abs Immature Granulocytes 0.18 (*)    All other components within normal limits  BRAIN NATRIURETIC PEPTIDE - Abnormal; Notable for the following components:   B Natriuretic Peptide 1,946.9 (*)    All other components within normal limits  COMPREHENSIVE METABOLIC PANEL - Abnormal; Notable for the following components:   Sodium 133 (*)    Potassium 3.4 (*)    Chloride 92 (*)    Glucose, Bld 124 (*)    BUN 27 (*)    Calcium 8.4 (*)    Total Protein 9.1 (*)    Albumin 2.9 (*)    AST 51 (*)    ALT 59 (*)    Alkaline Phosphatase 195 (*)    Total Bilirubin 5.2 (*)    All other components within normal limits  TROPONIN I (HIGH SENSITIVITY) - Abnormal; Notable for the following components:   Troponin I (High Sensitivity) 20 (*)    All other components within normal limits  CULTURE, BLOOD (ROUTINE X 2)  CULTURE, BLOOD (ROUTINE X 2)  SARS CORONAVIRUS 2 (TAT 6-24 HRS)  LIPASE, BLOOD  PROCALCITONIN  LACTIC ACID, PLASMA  URINALYSIS, COMPLETE (UACMP) WITH MICROSCOPIC  TROPONIN I (HIGH SENSITIVITY)   ____________________________________________  EKG  ED ECG REPORT I, Blake Divine, the attending physician, personally viewed and interpreted this ECG.   Date: 04/29/2020  EKG Time: 9:29  Rate: 131  Rhythm: atrial fibrillation, rate 131  Axis: Normal  Intervals:none  ST&T Change: None   PROCEDURES  Procedure(s) performed (including Critical Care):  .Critical Care Performed by: Blake Divine, MD Authorized by: Blake Divine, MD   Critical care provider statement:    Critical care time (minutes):  45   Critical care time was exclusive of:  Separately billable procedures and treating other patients and teaching time   Critical care was necessary to treat or prevent imminent or life-threatening deterioration of the following conditions:  Cardiac failure    Critical care was time spent personally by me on the following activities:  Discussions with consultants, evaluation of patient's response to treatment, examination of patient, ordering and performing treatments and interventions, ordering and review of laboratory studies, ordering and review of radiographic studies, pulse oximetry, re-evaluation of patient's condition, obtaining history from patient or surrogate and review of old charts   I assumed direction of critical care for this patient from another provider in  my specialty: no     Care discussed with: admitting provider   .1-3 Lead EKG Interpretation Performed by: Blake Divine, MD Authorized by: Blake Divine, MD     Interpretation: abnormal     ECG rate:  120-140   ECG rate assessment: tachycardic     Rhythm: atrial fibrillation     Ectopy: none     Conduction: normal       ____________________________________________   INITIAL IMPRESSION / ASSESSMENT AND PLAN / ED COURSE       70 year old male with past medical history of hypertension, hyperlipidemia, interstitial lung disease, CHF, atrial fibrillation, and rheumatoid arthritis who presents to the ED for increasing weakness, difficulty breathing, leg swelling, and pain in his chest since being discharged from the hospital about 1 week ago.  It appears he has been noncompliant with his medications since then and has developed A. fib RVR along with probable CHF exacerbation.  EKG shows A. fib RVR without ischemic changes, we will attempt to control rate with metoprolol given his significant systolic heart failure.  Plan to check chest x-ray and UA for infectious process given EMS reported fever of 100.4.  We will also check blood culture and lactate, although sepsis seems less likely.  He does have some tenderness in his upper abdomen, LFTs and lipase are pending.  LFTs show elevation in bilirubin, which appears to be new from previous.  CT scan of abdomen was performed and  negative for acute process, does show nodularity to patient's liver which I would favor to represent cirrhosis.  He does admit to ongoing alcohol consumption, including 40 ounce beers a couple of days ago.  Additional labs are also concerning for sepsis with leukocytosis, lactic acidosis, and elevated procalcitonin.  Both chest x-ray and CT of abdomen do not show obvious source, UA is pending and patient does endorse dysuria.  We will cover broadly with antibiotics for now, heart rate improved following dose of metoprolol.  He overall appears fluid overloaded and if he were to be given usual 30 cc/kg, I concerned he would go into respiratory failure.  We will treat with Lasix and case discussed with hospitalist for admission.      ____________________________________________   FINAL CLINICAL IMPRESSION(S) / ED DIAGNOSES  Final diagnoses:  Sepsis without acute organ dysfunction, due to unspecified organism [facility])  Atrial fibrillation with RVR ([place])  Acute on chronic systolic congestive heart failure [facility])     ED Discharge Orders    None       Note:  This document was prepared using Dragon voice recognition software and may include unintentional dictation errors.   Blake Divine, MD 04/29/20 [phone]

## 2020-04-29 NOTE — ED Triage Notes (Signed)
To ERD via [facility], past 2 weeks "pain all over" and unable to have BM for 2 days. Has PIs on buttocks that are "oozing" per EMS and has "UTI smell" 120/65, T 100.4 oral, 12 lead normal, CBG 168 A fib on monitor From home, discharged 2 wk ago from [facility]

## 2020-04-29 NOTE — ED Notes (Signed)
Attempting to re draw lactic and troponin. IVs do not draw back.

## 2020-04-29 NOTE — Progress Notes (Signed)
CODE SEPSIS - PHARMACY COMMUNICATION  **Broad Spectrum Antibiotics should be administered within 1 hour of Sepsis diagnosis**  Time Code Sepsis Called/Page Received: 1125  Antibiotics Ordered: cefepime/metronidazole/vancomycin  Time of 1st antibiotic administration: [place] ,PharmD Clinical Pharmacist  04/29/2020  12:04 PM

## 2020-04-29 NOTE — Progress Notes (Signed)
Notified provider and bedside nurse of need to order repeat lactic acid and fluid bolus.  Secure chat sent to admitting MD and bedside RN about a 3rd lactic to be ordered since the second resulted higher than the first. Also asked if any fluids would be considered since the ED MD had documented fluid overload.

## 2020-04-29 NOTE — Progress Notes (Signed)
elink tracking sepsis

## 2020-04-29 NOTE — Progress Notes (Signed)
PHARMACY -  BRIEF ANTIBIOTIC NOTE   Pharmacy has received consult(s) for vancomycin and cefepime from an ED provider.  The patient's profile has been reviewed for ht/wt/allergies/indication/available labs.    One time order(s) placed for vancomycin 1000 mg + cefepime 2 g  Further antibiotics/pharmacy consults should be ordered by admitting physician if indicated.                       Thank you,  Tawnya Crook, PharmD 04/29/2020  11:26 AM

## 2020-04-29 NOTE — ED Notes (Signed)
EDP at bedside..  Pt states has not taken prescribed meds since discharged from hospital which was 1 week ago, not 2.

## 2020-04-30 ENCOUNTER — Other Ambulatory Visit: Payer: Self-pay

## 2020-04-30 DIAGNOSIS — E43 Unspecified severe protein-calorie malnutrition: Secondary | ICD-10-CM | POA: Diagnosis present

## 2020-04-30 DIAGNOSIS — K219 Gastro-esophageal reflux disease without esophagitis: Secondary | ICD-10-CM | POA: Diagnosis present

## 2020-04-30 DIAGNOSIS — Z515 Encounter for palliative care: Secondary | ICD-10-CM | POA: Diagnosis not present

## 2020-04-30 DIAGNOSIS — I5023 Acute on chronic systolic (congestive) heart failure: Secondary | ICD-10-CM | POA: Diagnosis not present

## 2020-04-30 DIAGNOSIS — K7030 Alcoholic cirrhosis of liver without ascites: Secondary | ICD-10-CM | POA: Diagnosis not present

## 2020-04-30 DIAGNOSIS — I426 Alcoholic cardiomyopathy: Secondary | ICD-10-CM | POA: Diagnosis present

## 2020-04-30 DIAGNOSIS — L89120 Pressure ulcer of left upper back, unstageable: Secondary | ICD-10-CM | POA: Diagnosis not present

## 2020-04-30 DIAGNOSIS — E785 Hyperlipidemia, unspecified: Secondary | ICD-10-CM | POA: Diagnosis present

## 2020-04-30 DIAGNOSIS — J690 Pneumonitis due to inhalation of food and vomit: Secondary | ICD-10-CM

## 2020-04-30 DIAGNOSIS — Z20822 Contact with and (suspected) exposure to covid-19: Secondary | ICD-10-CM | POA: Diagnosis present

## 2020-04-30 DIAGNOSIS — A419 Sepsis, unspecified organism: Principal | ICD-10-CM

## 2020-04-30 DIAGNOSIS — I4891 Unspecified atrial fibrillation: Secondary | ICD-10-CM | POA: Diagnosis not present

## 2020-04-30 DIAGNOSIS — N179 Acute kidney failure, unspecified: Secondary | ICD-10-CM | POA: Diagnosis not present

## 2020-04-30 DIAGNOSIS — F1020 Alcohol dependence, uncomplicated: Secondary | ICD-10-CM | POA: Diagnosis present

## 2020-04-30 DIAGNOSIS — Z7189 Other specified counseling: Secondary | ICD-10-CM | POA: Diagnosis not present

## 2020-04-30 DIAGNOSIS — G9341 Metabolic encephalopathy: Secondary | ICD-10-CM | POA: Diagnosis present

## 2020-04-30 DIAGNOSIS — N170 Acute kidney failure with tubular necrosis: Secondary | ICD-10-CM | POA: Diagnosis not present

## 2020-04-30 DIAGNOSIS — N184 Chronic kidney disease, stage 4 (severe): Secondary | ICD-10-CM | POA: Diagnosis present

## 2020-04-30 DIAGNOSIS — I2510 Atherosclerotic heart disease of native coronary artery without angina pectoris: Secondary | ICD-10-CM | POA: Diagnosis present

## 2020-04-30 DIAGNOSIS — I959 Hypotension, unspecified: Secondary | ICD-10-CM | POA: Diagnosis present

## 2020-04-30 DIAGNOSIS — J440 Chronic obstructive pulmonary disease with acute lower respiratory infection: Secondary | ICD-10-CM | POA: Diagnosis present

## 2020-04-30 DIAGNOSIS — I5022 Chronic systolic (congestive) heart failure: Secondary | ICD-10-CM | POA: Diagnosis not present

## 2020-04-30 DIAGNOSIS — E872 Acidosis: Secondary | ICD-10-CM | POA: Diagnosis present

## 2020-04-30 DIAGNOSIS — E2740 Unspecified adrenocortical insufficiency: Secondary | ICD-10-CM | POA: Diagnosis present

## 2020-04-30 DIAGNOSIS — M47816 Spondylosis without myelopathy or radiculopathy, lumbar region: Secondary | ICD-10-CM | POA: Diagnosis present

## 2020-04-30 DIAGNOSIS — J449 Chronic obstructive pulmonary disease, unspecified: Secondary | ICD-10-CM | POA: Diagnosis not present

## 2020-04-30 DIAGNOSIS — I5043 Acute on chronic combined systolic (congestive) and diastolic (congestive) heart failure: Secondary | ICD-10-CM | POA: Diagnosis present

## 2020-04-30 DIAGNOSIS — R0602 Shortness of breath: Secondary | ICD-10-CM | POA: Diagnosis not present

## 2020-04-30 DIAGNOSIS — I130 Hypertensive heart and chronic kidney disease with heart failure and stage 1 through stage 4 chronic kidney disease, or unspecified chronic kidney disease: Secondary | ICD-10-CM | POA: Diagnosis present

## 2020-04-30 DIAGNOSIS — D684 Acquired coagulation factor deficiency: Secondary | ICD-10-CM | POA: Diagnosis present

## 2020-04-30 DIAGNOSIS — M0579 Rheumatoid arthritis with rheumatoid factor of multiple sites without organ or systems involvement: Secondary | ICD-10-CM | POA: Diagnosis present

## 2020-04-30 DIAGNOSIS — R6520 Severe sepsis without septic shock: Secondary | ICD-10-CM | POA: Diagnosis not present

## 2020-04-30 DIAGNOSIS — Z66 Do not resuscitate: Secondary | ICD-10-CM | POA: Diagnosis not present

## 2020-04-30 DIAGNOSIS — J849 Interstitial pulmonary disease, unspecified: Secondary | ICD-10-CM | POA: Diagnosis not present

## 2020-04-30 DIAGNOSIS — I480 Paroxysmal atrial fibrillation: Secondary | ICD-10-CM | POA: Diagnosis not present

## 2020-04-30 DIAGNOSIS — J9621 Acute and chronic respiratory failure with hypoxia: Secondary | ICD-10-CM | POA: Diagnosis present

## 2020-04-30 DIAGNOSIS — L89150 Pressure ulcer of sacral region, unstageable: Secondary | ICD-10-CM | POA: Diagnosis not present

## 2020-04-30 DIAGNOSIS — I509 Heart failure, unspecified: Secondary | ICD-10-CM | POA: Diagnosis not present

## 2020-04-30 LAB — VITAMIN D 25 HYDROXY (VIT D DEFICIENCY, FRACTURES): Vit D, 25-Hydroxy: 23.18 ng/mL — ABNORMAL LOW (ref 30–100)

## 2020-04-30 LAB — HEPATIC FUNCTION PANEL
ALT: 25 U/L (ref 0–44)
AST: 39 U/L (ref 15–41)
Albumin: 1.6 g/dL — ABNORMAL LOW (ref 3.5–5.0)
Alkaline Phosphatase: 106 U/L (ref 38–126)
Bilirubin, Direct: 1.2 mg/dL — ABNORMAL HIGH (ref 0.0–0.2)
Indirect Bilirubin: 1.8 mg/dL — ABNORMAL HIGH (ref 0.3–0.9)
Total Bilirubin: 3 mg/dL — ABNORMAL HIGH (ref 0.3–1.2)
Total Protein: 4.9 g/dL — ABNORMAL LOW (ref 6.5–8.1)

## 2020-04-30 LAB — T4, FREE: Free T4: 1.16 ng/dL — ABNORMAL HIGH (ref 0.61–1.12)

## 2020-04-30 LAB — LACTIC ACID, PLASMA: Lactic Acid, Venous: 2.7 mmol/L (ref 0.5–1.9)

## 2020-04-30 LAB — CBC
HCT: 38.6 % — ABNORMAL LOW (ref 39.0–52.0)
Hemoglobin: 13.2 g/dL (ref 13.0–17.0)
MCH: 27.5 pg (ref 26.0–34.0)
MCHC: 34.2 g/dL (ref 30.0–36.0)
MCV: 80.4 fL (ref 80.0–100.0)
Platelets: 127 10*3/uL — ABNORMAL LOW (ref 150–400)
RBC: 4.8 MIL/uL (ref 4.22–5.81)
RDW: 15.3 % (ref 11.5–15.5)
WBC: 18.7 10*3/uL — ABNORMAL HIGH (ref 4.0–10.5)
nRBC: 0 % (ref 0.0–0.2)

## 2020-04-30 LAB — PROTIME-INR
INR: 1.6 — ABNORMAL HIGH (ref 0.8–1.2)
Prothrombin Time: 18.6 seconds — ABNORMAL HIGH (ref 11.4–15.2)

## 2020-04-30 LAB — PROCALCITONIN: Procalcitonin: 2.87 ng/mL

## 2020-04-30 MED ORDER — ALBUMIN HUMAN 25 % IV SOLN
25.0000 g | Freq: Once | INTRAVENOUS | Status: AC
  Administered 2020-04-30: 25 g via INTRAVENOUS
  Filled 2020-04-30: qty 100

## 2020-04-30 MED ORDER — ALBUMIN HUMAN 25 % IV SOLN
25.0000 g | Freq: Four times a day (QID) | INTRAVENOUS | Status: AC
  Administered 2020-04-30 – 2020-05-01 (×3): 25 g via INTRAVENOUS
  Administered 2020-05-01: 12.5 g via INTRAVENOUS
  Filled 2020-04-30 (×4): qty 100

## 2020-04-30 MED ORDER — SODIUM CHLORIDE 0.9 % IV SOLN
1.0000 g | Freq: Two times a day (BID) | INTRAVENOUS | Status: DC
  Administered 2020-04-30 – 2020-05-01 (×3): 1 g via INTRAVENOUS
  Filled 2020-04-30 (×4): qty 1

## 2020-04-30 MED ORDER — RIVAROXABAN 20 MG PO TABS
20.0000 mg | ORAL_TABLET | Freq: Every day | ORAL | Status: DC
Start: 2020-04-30 — End: 2020-05-01
  Administered 2020-04-30 – 2020-05-01 (×2): 20 mg via ORAL
  Filled 2020-04-30 (×2): qty 1

## 2020-04-30 MED ORDER — FUROSEMIDE 10 MG/ML IJ SOLN
4.0000 mg/h | INTRAVENOUS | Status: DC
  Administered 2020-04-30: 4 mg/h via INTRAVENOUS
  Filled 2020-04-30 (×2): qty 20

## 2020-04-30 MED ORDER — POTASSIUM CHLORIDE 20 MEQ PO PACK
40.0000 meq | PACK | ORAL | Status: AC
  Administered 2020-04-30 (×2): 40 meq via ORAL
  Filled 2020-04-30 (×2): qty 2

## 2020-04-30 MED ORDER — ENSURE ENLIVE PO LIQD
237.0000 mL | Freq: Two times a day (BID) | ORAL | Status: DC
  Administered 2020-04-30 – 2020-05-01 (×3): 237 mL via ORAL

## 2020-04-30 MED ORDER — POTASSIUM CHLORIDE 10 MEQ/100ML IV SOLN
10.0000 meq | INTRAVENOUS | Status: DC
Start: 2020-04-30 — End: 2020-04-30

## 2020-04-30 MED ORDER — CALCIUM CARBONATE ANTACID 500 MG PO CHEW
400.0000 mg | CHEWABLE_TABLET | Freq: Three times a day (TID) | ORAL | Status: DC
  Administered 2020-04-30 – 2020-05-13 (×28): 400 mg via ORAL
  Filled 2020-04-30 (×33): qty 2

## 2020-04-30 NOTE — Evaluation (Signed)
Occupational Therapy Evaluation Patient Details Name: Jason Fitzgerald MRN: [id number] DOB: 13-Feb-1950 Today's Date: 04/30/2020    History of Present Illness 70 y.o. male with medical history significant for hypertension, interstitial lung disease, heart failure reduced ejection fraction, hyperlipidemia, history of atrial fibrillation, presents emergency department by EMS for shortness of breath.   Clinical Impression   Patient presenting with decreased I in self care, balance, functional mobility/transfer, endurance, and safety awareness.  Patient reports being fully independent at home without use of AD at baseline. No family present to confirm baseline. Pt is A & Ox3 and unsure of month and year when asked. Pt later reports he had been in recliner chair for several days and unable to move and using a urinal for toileting needs. Pt refusing OOB assessment secondary to pain from skin breakdown. Pt was agreeable to allow therapist to place him into side lying position with max A for rolling. Pt noted to have discharge all over eyes and skin breakdown on nose from glasses. Pt washing face with set up A and glasses removed. RN notified. Pt will need higher level of care with recommendation for SNF at discharge.Patient will benefit from acute OT to increase overall independence in the areas of ADLs, functional mobility, and safety awareness in order to safely discharge to next venue of care.    Follow Up Recommendations  SNF;Supervision/Assistance - 24 hour    Equipment Recommendations  Other (comment) (defer to next venue of care)       Precautions / Restrictions Precautions Precautions: Fall      Mobility Bed Mobility Overal bed mobility: Needs Assistance Bed Mobility: Rolling Rolling: Max assist              Transfers                 General transfer comment: pt refusal        ADL either performed or assessed with clinical judgement   ADL Overall ADL's : Needs  assistance/impaired     Grooming: Wash/dry hands;Wash/dry face;Supervision/safety;Set up;Bed level               Lower Body Dressing: Total assistance;Bed level     Toilet Transfer Details (indicate cue type and reason): pt refusal           General ADL Comments: Pt with discharge in eyes and pressure sores on nose from glasses. OT providing pt with warm cloth to wash face. Glasses removed. Pt refusal for EOB or OOB activities. Max - total A for rolling this session.     Vision Baseline Vision/History: Wears glasses Patient Visual Report: No change from baseline              Pertinent Vitals/Pain Pain Assessment: 0-10 Pain Score: 10-Worst pain ever Pain Location: back Pain Descriptors / Indicators: Grimacing;Guarding;Tender;Other (Comment) (crying out) Pain Intervention(s): Limited activity within patient's tolerance;Monitored during session;Premedicated before session;Repositioned     Hand Dominance Left   Extremity/Trunk Assessment Upper Extremity Assessment Upper Extremity Assessment: Generalized weakness   Lower Extremity Assessment Lower Extremity Assessment: Defer to PT evaluation       Communication Communication Communication: No difficulties   Cognition Arousal/Alertness: Awake/alert Behavior During Therapy: WFL for tasks assessed/performed Overall Cognitive Status: No family/caregiver present to determine baseline cognitive functioning                                 General Comments: Pt oriented  to self , location, and situation. He is unable to even tell therapist the month.              Home Living Family/patient expects to be discharged to:: Private residence Living Arrangements: Alone Available Help at Discharge: Neighbor;Available PRN/intermittently;Family Type of Home: Apartment Home Access: Level entry     Home Layout: One level               Home Equipment: Shower seat;Bedside commode          Prior  Functioning/Environment Level of Independence: Independent        Comments: Pt reports complete independence at baseline with self care and functional mobility. Pt reports he does not drive and family assist with groceries as needed. He takes sink baths.        OT Problem List: Decreased strength;Decreased knowledge of use of DME or AE;Decreased activity tolerance;Impaired balance (sitting and/or standing);Decreased safety awareness;Pain      OT Treatment/Interventions: Self-care/ADL training;Manual therapy;Therapeutic exercise;Energy conservation;DME and/or AE instruction;Therapeutic activities;Balance training;Patient/family education;Modalities    OT Goals(Current goals can be found in the care plan section) Acute Rehab OT Goals Patient Stated Goal: stop hurting OT Goal Formulation: With patient Time For Goal Achievement: 05/14/20 Potential to Achieve Goals: Fair ADL Goals Pt Will Perform Grooming: with supervision;sitting Pt Will Transfer to Toilet: with min assist;ambulating;bedside commode Pt Will Perform Toileting - Clothing Manipulation and hygiene: with min assist;sit to/from stand Pt/caregiver will Perform Home Exercise Program: Increased strength;Both right and left upper extremity;With theraband;With Supervision;With written HEP provided  OT Frequency: Min 2X/week   Barriers to D/C: Decreased caregiver support             AM-PAC OT "6 Clicks" Daily Activity     Outcome Measure Help from another person eating meals?: A Little Help from another person taking care of personal grooming?: A Little Help from another person toileting, which includes using toliet, bedpan, or urinal?: Total Help from another person bathing (including washing, rinsing, drying)?: A Lot Help from another person to put on and taking off regular upper body clothing?: A Little Help from another person to put on and taking off regular lower body clothing?: Total 6 Click Score: 13   End of  Session Nurse Communication: Mobility status  Activity Tolerance: No increased pain Patient left: in bed;with call bell/phone within reach;with bed alarm set  OT Visit Diagnosis: Muscle weakness (generalized) (M62.81);Unsteadiness on feet (R26.81);Pain Pain - part of body:  (back and buttocks)                Time: 5462-7035 OT Time Calculation (min): 21 min Charges:  OT General Charges $OT Visit: 1 Visit OT Evaluation $OT Eval Moderate Complexity: 1 Mod OT Treatments $Self Care/Home Management : 8-22 mins  Darleen Crocker, MS, OTR/L , CBIS ascom [phone]  04/30/20, 12:46 PM

## 2020-04-30 NOTE — Progress Notes (Signed)
PROGRESS NOTE    Jason Fitzgerald  XYV:[id number] DOB: [birth year] DOA: 04/29/2020 PCP: Steele Sizer, MD   Chief complaint.  Shortness of breath. Brief Narrative:  Jason Fitzgerald is a 70 y.o. male with medical history significant for hypertension, interstitial lung disease, heart failure reduced ejection fraction, hyperlipidemia, history of atrial fibrillation, presents emergency department by EMS for shortness of breath.  Patient condition has been progressively getting worse over the last week, he has a severe short of breath with minimal exertion, he has been sleeping in a recliner, frequent paroxysmal nocturnal dyspnea.  He also drinks alcohol and he used cocaine. Upon arriving the hospital, he was hypotensive.     Assessment & Plan:   Principal Problem:   Shortness of breath Active Problems:   Arteriosclerosis of coronary artery   HTN (hypertension)   Interstitial lung disease (HCC)   Chronic cough   Degenerative arthritis of lumbar spine   Chronic systolic CHF (congestive heart failure) (HCC)   AF (paroxysmal atrial fibrillation) (HCC)   Rheumatoid arthritis involving multiple sites with positive rheumatoid factor (HCC)   Fibrosis, pulmonary, interstitial, diffuse (HCC)   GERD (gastroesophageal reflux disease)   COPD (chronic obstructive pulmonary disease) (HCC)   CHF (congestive heart failure) (HCC)   Anemia   Atrial fibrillation (HCC)   Non compliance w medication regimen   DNR (do not resuscitate)   Prolonged QT interval   Acute on chronic systolic (congestive) heart failure ([place])  #1.  Acute on chronic systolic congestive heart failure. Paroxysmal atrial fibrillation. 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 Severe hypoalbuminemia. Intermittent hypotension. Coagulopathy secondary to liver failure. Elevated troponin secondary to congestive heart failure. Patient has evidence of volume overload, significant paroxysmal nocturnal dyspnea, leg edema.   He has intermittent hypotension due to severe LV dysfunction.  Ejection fraction less than 20%. Patient also has liver function changes with elevated bilirubin.  This is due to right-sided congestive heart failure. Patient overall condition appears to be end-stage congestive heart failure.  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 Due to intermittent hypotension, patient was given 25 g albumin.  I will also start IV Lasix drip, at the meantime, will give albumin 25 g every 6 hours.  Albumin level only 1.6.  We will continue this for 24 hours. Not able to start chronic congestive heart failure treatment due to hypotension. Continue midodrine. Patient condition is very serious, very high risk of mortality.  #2.  Severe sepsis. Aspiration pneumonia. Interstitial lung disease. Patient met sepsis criteria at time admission, he had tachycardia, tachypnea, hypotension, leukocytosis.  He also has significant lactic acidosis. I reviewed the patient chest x-ray and CT scan, patient has diffuse interstitial lung disease. Due to alcohol drinking, patient has high risk of aspiration pneumonia.  Patient also has Pseudomonas risk due to interstitial lung disease.  I will continue cefepime for now.  #3.  Paroxysmal atrial fibrillation. Resume Xarelto, patient does not have active bleeding.  4.  Alcohol abuse. Cocaine abuse. Continue alcohol withdrawal protocol.  Continue thiamine and folic acid.  5.  Hypothyroidism. Check cortisol level, if normal, may start Synthroid tomorrow.  #6.  Severe protein calorie malnutrition. Patient had significant muscle atrophy, looks malnourished.  We will start protein supplement.  #7.  Hypokalemia. Repleted.  Check magnesium and phosphorus level tomorrow.  DVT prophylaxis: Xarelto Code Status: DNR Family Communication:  Disposition Plan:  .   Status is: Observation  The patient will require care spanning > 2 midnights and should be  moved to  inpatient because: Inpatient level of care appropriate due to severity of illness  Dispo: The patient is from: Home              Anticipated d/c is to: Home              Patient currently is not medically stable to d/c.   Difficult to place patient No        I/O last 3 completed shifts: In: 394.2 [IV Piggyback:394.2] Out: -  No intake/output data recorded.     Consultants:   Cardiology  Procedures: None  Antimicrobials: Cefepime  Subjective: Patient complaining significant short of breath at rest, worse with minimal exertion.  He could not lie flat.  Cough, with occasional yellow mucus. No fever chills. No chest pain or palpitation. Denies any abdominal pain or nausea vomiting. Chronic back pain. No dysuria hematuria.   Objective: Vitals:   04/30/20 0900 04/30/20 1000 04/30/20 1203 04/30/20 1205  BP: 99/73 (!) 92/58  (!) 83/58  Pulse:  96  97  Resp: (!) 30 20  18   Temp:  98.1 F (36.7 C)  98.1 F (36.7 C)  TempSrc:  Oral  Oral  SpO2:  96%  100%  Weight:   77.2 kg   Height:   6' (1.829 m)     Intake/Output Summary (Last 24 hours) at 04/30/2020 1342 Last data filed at 04/29/2020 2011 Gross per 24 hour  Intake 394.24 ml  Output --  Net 394.24 ml   Filed Weights   04/29/20 0925 04/30/20 1203  Weight: 74.8 kg 77.2 kg    Examination:  General exam: Ill-appearing, malnourished. Respiratory system: Decreased breathing sounds, with a small crackles in the base. Respiratory effort normal. Cardiovascular system: S1 & S2 heard, RRR. No JVD, murmurs, rubs, gallops or clicks.  Gastrointestinal system: Abdomen is nondistended, soft and nontender.  Patient has hepatomegaly.  Normal bowel sounds heard. Central nervous system: Alert and oriented x2. No focal neurological deficits. Extremities: Muscle atrophy, leg edema 2+. Skin: No rashes, lesions or ulcers Psychiatry: Mood & affect appropriate.     Data Reviewed: I have personally reviewed following labs and  imaging studies  CBC: Recent Labs  Lab 04/29/20 0934 04/30/20 0449  WBC 17.9* 18.7*  NEUTROABS 15.4*  --   HGB 15.5 13.2  HCT 45.5 38.6*  MCV 79.8* 80.4  PLT 149* 588*   Basic Metabolic Panel: Recent Labs  Lab 04/29/20 0934 04/29/20 1420 04/30/20 0449  NA 133*  --  138  K 3.4*  --  3.4*  CL 92*  --  109  CO2 26  --  23  GLUCOSE 124*  --  88  BUN 27*  --  27*  CREATININE 0.99  --  0.64  CALCIUM 8.4*  --  5.6*  MG  --  2.3  --   PHOS  --  2.6  --    GFR: Estimated Creatinine Clearance: 96.5 mL/min (by C-G formula based on SCr of 0.64 mg/dL). Liver Function Tests: Recent Labs  Lab 04/29/20 0934 04/30/20 0449  AST 51* 39  ALT 59* 25  ALKPHOS 195* 106  BILITOT 5.2* 3.0*  PROT 9.1* 4.9*  ALBUMIN 2.9* 1.6*   Recent Labs  Lab 04/29/20 0934  LIPASE 29   No results for input(s): AMMONIA in the last 168 hours. Coagulation Profile: Recent Labs  Lab 04/30/20 0449  INR 1.6*   Cardiac Enzymes: No results for input(s): CKTOTAL, CKMB, CKMBINDEX, TROPONINI in the last  168 hours. BNP (last 3 results) No results for input(s): PROBNP in the last 8760 hours. HbA1C: No results for input(s): HGBA1C in the last 72 hours. CBG: No results for input(s): GLUCAP in the last 168 hours. Lipid Profile: No results for input(s): CHOL, HDL, LDLCALC, TRIG, CHOLHDL, LDLDIRECT in the last 72 hours. Thyroid Function Tests: Recent Labs    04/29/20 1420 04/30/20 0449  TSH 7.981*  --   FREET4  --  1.16*   Anemia Panel: No results for input(s): VITAMINB12, FOLATE, FERRITIN, TIBC, IRON, RETICCTPCT in the last 72 hours. Sepsis Labs: Recent Labs  Lab 04/29/20 0934 04/29/20 1043 04/29/20 1220 04/30/20 0449 04/30/20 1242  PROCALCITON  --  3.66  --  2.87  --   LATICACIDVEN 2.4*  --  3.2*  --  2.7*    Recent Results (from the past 240 hour(s))  Culture, blood (routine x 2)     Status: None (Preliminary result)   Collection Time: 04/29/20  9:34 AM   Specimen: BLOOD  Result  Value Ref Range Status   Specimen Description BLOOD RIGHT ARM  Final   Special Requests   Final    BOTTLES DRAWN AEROBIC AND ANAEROBIC Blood Culture adequate volume   Culture   Final    NO GROWTH < 24 HOURS Performed at [facility], [address] [place], [place] [address]    Report Status PENDING  Incomplete  Culture, blood (routine x 2)     Status: None (Preliminary result)   Collection Time: 04/29/20  9:34 AM   Specimen: BLOOD  Result Value Ref Range Status   Specimen Description BLOOD BLOOD LEFT ARM  Final   Special Requests   Final    BOTTLES DRAWN AEROBIC AND ANAEROBIC Blood Culture adequate volume   Culture   Final    NO GROWTH < 24 HOURS Performed at [facility], [address] [place], [place] [address]    Report Status PENDING  Incomplete  SARS CORONAVIRUS 2 (TAT 6-24 HRS) Nasopharyngeal Nasopharyngeal Swab     Status: None   Collection Time: 04/29/20  9:40 AM   Specimen: Nasopharyngeal Swab  Result Value Ref Range Status   SARS Coronavirus 2 NEGATIVE NEGATIVE Final    Comment: (NOTE) SARS-CoV-2 target nucleic acids are NOT DETECTED.  The SARS-CoV-2 RNA is generally detectable in upper and lower respiratory specimens during the acute phase of infection. Negative results do not preclude SARS-CoV-2 infection, do not rule out co-infections with other pathogens, and should not be used as the sole basis for treatment or other patient management decisions. Negative results must be combined with clinical observations, patient history, and epidemiological information. The expected result is Negative.  Fact Sheet for Patients: SugarRoll.be  Fact Sheet for Healthcare Providers: https://www.woods-mathews.com/  This test is not yet approved or cleared by the [place] FDA and  has been authorized for detection and/or diagnosis of SARS-CoV-2 by FDA under an Emergency Use Authorization (EUA). This EUA will  remain  in effect (meaning this test can be used) for the duration of the COVID-19 declaration under Se ction 564(b)(1) of the Act, 21 U.S.C. section 360bbb-3(b)(1), unless the authorization is terminated or revoked sooner.  Performed at [facility], [place] [address] [place], [place] [address]          Radiology Studies: CT Abdomen Pelvis W Contrast  Result Date: 04/29/2020 CLINICAL DATA:  Acute nonlocalized abdominal pain EXAM: CT ABDOMEN AND PELVIS WITH CONTRAST TECHNIQUE: Multidetector CT imaging of  the abdomen and pelvis was performed using the standard protocol following bolus administration of intravenous contrast. CONTRAST:  159m OMNIPAQUE IOHEXOL 300 MG/ML  SOLN COMPARISON:  07/20/2013 FINDINGS: Lower chest: Small bilateral pleural effusion. Fibrosis with honeycombing is seen diffusely at the lung bases. Heart size is within normal limits. Gynecomastia. Hepatobiliary: Heterogeneous density of the liver with nutmeg appearance. Subcapsular right hepatic cyst. No solid masslike finding.Calcification at the gallbladder fossa which appears extraluminal and is chronic. No evidence of biliary inflammation or obstruction. Pancreas: Unremarkable. Spleen: Unremarkable. Adrenals/Urinary Tract: Negative adrenals. No hydronephrosis or stone. Small renal cystic densities. Unremarkable bladder. Stomach/Bowel:  No obstruction. No visible inflammation of bowel. Vascular/Lymphatic: No acute vascular abnormality. Scattered atheromatous calcification of the aorta. No mass or adenopathy. Reproductive:Partially covered right hydrocele Other: No ascites or pneumoperitoneum.  Hernia repair using mesh. Musculoskeletal: No acute abnormalities.  Lumbar spine degeneration. IMPRESSION: 1. No acute intra-abdominal finding. 2. Heterogeneous liver perfusion which could be from fibrosis or right heart failure. 3. Small pleural effusions. 4.  Aortic Atherosclerosis (ICD10-I70.0). Electronically Signed   By: JMonte FantasiaM.D.   On: 04/29/2020 11:31   DG Chest Port 1 View  Result Date: 04/29/2020 CLINICAL DATA:  Shortness of breath and pain. EXAM: PORTABLE CHEST 1 VIEW COMPARISON:  04/18/2020 FINDINGS: Stable cardiomediastinal contours. Extensive fibrotic lung disease is identified within both lungs. This is most advanced within the mid and lower lung zones. No definite superimposed pulmonary opacity. There is a suggestion of subcutaneous gas within the soft tissues in the left supraclavicular region. IMPRESSION: 1. Extensive fibrotic lung disease. 2. No superimposed acute cardiopulmonary abnormality. Electronically Signed   By: TKerby MoorsM.D.   On: 04/29/2020 10:43        Scheduled Meds: . atorvastatin  40 mg Oral Daily  . calcium carbonate  400 mg of elemental calcium Oral TID WC  . dapagliflozin propanediol  10 mg Oral Daily  . enoxaparin (LOVENOX) injection  40 mg Subcutaneous Q24H  . folic acid  1 mg Oral Daily  . metoprolol succinate  25 mg Oral Daily  . midodrine  10 mg Oral TID WC  . multivitamin with minerals  1 tablet Oral Daily  . potassium chloride  40 mEq Oral Q2H  . thiamine  100 mg Oral Daily   Or  . thiamine  100 mg Intravenous Daily   Continuous Infusions: . cefTRIAXone (ROCEPHIN)  IV Stopped (04/30/20 1246)  . doxycycline (VIBRAMYCIN) IV Stopped (04/30/20 00998     LOS: 0 days    Time spent: 36 minutes    DSharen Hones MD [facility]   To contact the attending provider between 7A-7P or the covering provider during after hours 7P-7A, please log into the web site [email or web address] and access using universal [place] password for that web site. If you do not have the password, please call the hospital operator.  04/30/2020, 1:42 PM

## 2020-04-30 NOTE — Progress Notes (Signed)
PT Cancellation Note  Patient Details Name: Jason Fitzgerald MRN: [id number] DOB: 1950-04-09   Cancelled Treatment:    Reason Eval/Treat Not Completed: Other (comment). Currently off unit, unavailable for PT evaluation at this time. Will re-attempt next date.   Gio Janoski 04/30/2020, 11:33 AM  Greggory Stallion, PT, DPT [phone]

## 2020-04-30 NOTE — ED Notes (Signed)
Pt had a complete bed change, meplex applied to coccyx. Skin break down to coccyx. Pt refused bed change throughout the night, stating he wanted to just sleep.

## 2020-05-01 DIAGNOSIS — R0602 Shortness of breath: Secondary | ICD-10-CM | POA: Diagnosis not present

## 2020-05-01 LAB — COMPREHENSIVE METABOLIC PANEL
ALT: 25 U/L (ref 0–44)
AST: 24 U/L (ref 15–41)
Albumin: 2.8 g/dL — ABNORMAL LOW (ref 3.5–5.0)
Alkaline Phosphatase: 107 U/L (ref 38–126)
Anion gap: 14 (ref 5–15)
BUN: 32 mg/dL — ABNORMAL HIGH (ref 8–23)
CO2: 26 mmol/L (ref 22–32)
Calcium: 7.6 mg/dL — ABNORMAL LOW (ref 8.9–10.3)
Chloride: 94 mmol/L — ABNORMAL LOW (ref 98–111)
Creatinine, Ser: 0.89 mg/dL (ref 0.61–1.24)
GFR, Estimated: >60 mL/min (ref >60)
Glucose, Bld: 108 mg/dL — ABNORMAL HIGH (ref 70–99)
Potassium: 3.1 mmol/L — ABNORMAL LOW (ref 3.5–5.1)
Sodium: 134 mmol/L — ABNORMAL LOW (ref 135–145)
Total Bilirubin: 2.7 mg/dL — ABNORMAL HIGH (ref 0.3–1.2)
Total Protein: 6 g/dL — ABNORMAL LOW (ref 6.5–8.1)

## 2020-05-01 LAB — CBC WITH DIFFERENTIAL/PLATELET
Abs Immature Granulocytes: 0.1 10*3/uL — ABNORMAL HIGH (ref 0.00–0.07)
Basophils Absolute: 0 10*3/uL (ref 0.0–0.1)
Basophils Relative: 0 %
Eosinophils Absolute: 0 10*3/uL (ref 0.0–0.5)
Eosinophils Relative: 0 %
HCT: 34.3 % — ABNORMAL LOW (ref 39.0–52.0)
Hemoglobin: 11.5 g/dL — ABNORMAL LOW (ref 13.0–17.0)
Immature Granulocytes: 1 %
Lymphocytes Relative: 10 %
Lymphs Abs: 1.1 10*3/uL (ref 0.7–4.0)
MCH: 27.3 pg (ref 26.0–34.0)
MCHC: 33.5 g/dL (ref 30.0–36.0)
MCV: 81.3 fL (ref 80.0–100.0)
Monocytes Absolute: 0.7 10*3/uL (ref 0.1–1.0)
Monocytes Relative: 7 %
Neutro Abs: 8.6 10*3/uL — ABNORMAL HIGH (ref 1.7–7.7)
Neutrophils Relative %: 82 %
Platelets: 115 10*3/uL — ABNORMAL LOW (ref 150–400)
RBC: 4.22 MIL/uL (ref 4.22–5.81)
RDW: 15.5 % (ref 11.5–15.5)
WBC: 10.6 10*3/uL — ABNORMAL HIGH (ref 4.0–10.5)
nRBC: 0 % (ref 0.0–0.2)

## 2020-05-01 LAB — CORTISOL-AM, BLOOD: Cortisol - AM: 24.9 ug/dL — ABNORMAL HIGH (ref 6.7–22.6)

## 2020-05-01 LAB — PROCALCITONIN: Procalcitonin: 3.97 ng/mL

## 2020-05-01 LAB — PHOSPHORUS: Phosphorus: 2.5 mg/dL (ref 2.5–4.6)

## 2020-05-01 LAB — BASIC METABOLIC PANEL
Anion gap: 6 (ref 5–15)
BUN: 27 mg/dL — ABNORMAL HIGH (ref 8–23)
CO2: 23 mmol/L (ref 22–32)
Calcium: 5.6 mg/dL — CL (ref 8.9–10.3)
Chloride: 109 mmol/L (ref 98–111)
Creatinine, Ser: 0.64 mg/dL (ref 0.61–1.24)
GFR, Estimated: >60 mL/min (ref >60)
Glucose, Bld: 88 mg/dL (ref 70–99)
Potassium: 3.4 mmol/L — ABNORMAL LOW (ref 3.5–5.1)
Sodium: 138 mmol/L (ref 135–145)

## 2020-05-01 LAB — MAGNESIUM: Magnesium: 1.7 mg/dL (ref 1.7–2.4)

## 2020-05-01 MED ORDER — FUROSEMIDE 20 MG PO TABS
20.0000 mg | ORAL_TABLET | Freq: Two times a day (BID) | ORAL | Status: DC
  Administered 2020-05-01 – 2020-05-02 (×3): 20 mg via ORAL
  Filled 2020-05-01 (×3): qty 1

## 2020-05-01 MED ORDER — ENOXAPARIN SODIUM 40 MG/0.4ML ~~LOC~~ SOLN
40.0000 mg | SUBCUTANEOUS | Status: DC
  Administered 2020-05-02 – 2020-05-06 (×5): 40 mg via SUBCUTANEOUS
  Filled 2020-05-01 (×6): qty 0.4

## 2020-05-01 MED ORDER — POTASSIUM CHLORIDE 20 MEQ PO PACK
40.0000 meq | PACK | Freq: Once | ORAL | Status: AC
  Administered 2020-05-01: 40 meq via ORAL
  Filled 2020-05-01: qty 2

## 2020-05-01 MED ORDER — SODIUM CHLORIDE 0.9 % IV SOLN
2.0000 g | Freq: Three times a day (TID) | INTRAVENOUS | Status: DC
  Administered 2020-05-01 – 2020-05-03 (×7): 2 g via INTRAVENOUS
  Filled 2020-05-01 (×8): qty 2

## 2020-05-01 MED ORDER — POLYETHYLENE GLYCOL 3350 17 G PO PACK
34.0000 g | PACK | Freq: Two times a day (BID) | ORAL | Status: DC
  Administered 2020-05-01 – 2020-05-03 (×4): 34 g via ORAL
  Filled 2020-05-01 (×7): qty 2

## 2020-05-01 NOTE — Evaluation (Signed)
Physical Therapy Evaluation Patient Details Name: Jason Fitzgerald MRN: [id number] DOB: 28-Aug-1950 Today's Date: 05/01/2020   History of Present Illness  70 y.o. male with medical history significant for hypertension, interstitial lung disease, heart failure reduced ejection fraction, hyperlipidemia, history of atrial fibrillation, presents emergency department by EMS for shortness of breath.  Clinical Impression  Patient received in bed, he is agreeable to PT session. Reports he has been vomiting. RN present and administering anti-nausea medication. Patient reports he has to go to the bathroom. Patient requires max assist for bed mobility, increased time needed as patient moaning throughout mobility. Patient requires +2 max assist for sit to stand and to pivot to bsc and back to bed. Patient will continue to benefit from skilled PT while here to improve functional independence, strength and safety.     Follow Up Recommendations SNF    Equipment Recommendations  None recommended by PT    Recommendations for Other Services       Precautions / Restrictions Precautions Precautions: Fall Restrictions Weight Bearing Restrictions: No      Mobility  Bed Mobility Overal bed mobility: Needs Assistance Bed Mobility: Supine to Sit;Sit to Supine     Supine to sit: Max assist Sit to supine: Max assist;+2 for physical assistance   General bed mobility comments: Patient requires assist to bring legs on and off bed, assist to raise trunk to seated position, scoot to edge of bed. Patient moaning throughout all mobility.    Transfers Overall transfer level: Needs assistance Equipment used: 2 person hand held assist Transfers: Sit to/from [employer] Transfers Sit to Stand: +2 physical assistance;Max assist Stand pivot transfers: Max assist       General transfer comment: max +2 for stand pivot bed><bsc  Ambulation/Gait             General Gait Details: unable  Stairs             Wheelchair Mobility    Modified Rankin (Stroke Patients Only)       Balance Overall balance assessment: Needs assistance Sitting-balance support: Feet supported;Single extremity supported Sitting balance-[name]: Poor Sitting balance - Comments: falling to his right with sitting up on side of bed. heavy leaning on right UE for support   Standing balance support: During functional activity;Bilateral upper extremity supported Standing balance-[name]: Poor Standing balance comment: requires +2 assist for pivot transfer                             Pertinent Vitals/Pain Pain Assessment: Faces Faces Pain Scale: Hurts whole lot Pain Location: back, abdomen Pain Descriptors / Indicators: Grimacing;Guarding;Tender;Other (Comment);Moaning Pain Intervention(s): Monitored during session;Repositioned    Home Living Family/patient expects to be discharged to:: Private residence Living Arrangements: Alone Available Help at Discharge: Neighbor;Available PRN/intermittently;Family Type of Home: Apartment Home Access: Level entry     Home Layout: One level Home Equipment: Shower seat;Bedside commode;Walker - 2 wheels      Prior Function           Comments: Pt reports complete independence at baseline with self care and functional mobility (although he has pressure sores on back and bottom)  Pt reports he does not drive and family assist with groceries as needed. He takes sink baths.     Hand Dominance        Extremity/Trunk Assessment   Upper Extremity Assessment Upper Extremity Assessment: Generalized weakness    Lower Extremity Assessment Lower Extremity  Assessment: Generalized weakness       Communication   Communication: No difficulties  Cognition Arousal/Alertness: Awake/alert Behavior During Therapy: WFL for tasks assessed/performed Overall Cognitive Status: No family/caregiver present to determine baseline cognitive  functioning                                 General Comments: Patient states that I am impatient, agrivated when trying to assist him or direct him. He continues to state "i am doing the best I can."      General Comments      Exercises     Assessment/Plan    PT Assessment Patient needs continued PT services  PT Problem List Decreased strength;Decreased mobility;Decreased activity tolerance;Decreased balance;Pain;Decreased safety awareness       PT Treatment Interventions DME instruction;Therapeutic exercise;Gait training;Balance training;Functional mobility training;Therapeutic activities;Patient/family education    PT Goals (Current goals can be found in the Care Plan section)  Acute Rehab PT Goals Patient Stated Goal: decrease pain, return home PT Goal Formulation: With patient Time For Goal Achievement: 05/14/20 Potential to Achieve Goals: Fair    Frequency Min 2X/week   Barriers to discharge Decreased caregiver support      Co-evaluation               AM-PAC PT "6 Clicks" Mobility  Outcome Measure Help needed turning from your back to your side while in a flat bed without using bedrails?: A Lot Help needed moving from lying on your back to sitting on the side of a flat bed without using bedrails?: A Lot Help needed moving to and from a bed to a chair (including a wheelchair)?: A Lot Help needed standing up from a chair using your arms (e.g., wheelchair or bedside chair)?: A Lot Help needed to walk in hospital room?: Total Help needed climbing 3-5 steps with a railing? : Total 6 Click Score: 10    End of Session Equipment Utilized During Treatment: Gait belt Activity Tolerance: Patient limited by fatigue;Patient limited by pain Patient left: in bed;with call bell/phone within reach;with bed alarm set Nurse Communication: Mobility status PT Visit Diagnosis: Other abnormalities of gait and mobility (R26.89);Unsteadiness on feet  (R26.81);Pain;Muscle weakness (generalized) (M62.81) Pain - part of body:  (back, abdomen)    Time: 0962-8366 PT Time Calculation (min) (ACUTE ONLY): 24 min   Charges:   PT Evaluation $PT Eval Moderate Complexity: 1 Mod PT Treatments $Therapeutic Activity: 8-22 mins        Sybil Shrader, PT, GCS 05/01/20,11:57 AM

## 2020-05-01 NOTE — Progress Notes (Signed)
OT Cancellation Note  Patient Details Name: Jason Fitzgerald MRN: [id number] DOB: December 06, 1950   Cancelled Treatment:    Reason Eval/Treat Not Completed: Patient declined, no reason specified  OT attempts to engage pt in therapy session with multiple modes of participation. Pt continually declines to participate stating that he is stick to his stomach because his bowels haven't moved. OT offers education that pt needs to move his body to help facilitate bowel movement, but pt still declines to participate. Will f/u for OT treatment at later date/time as able. Thank you.  Gerrianne Scale, [employer], OTR/L ascom [phone] 05/01/20, 3:51 PM

## 2020-05-01 NOTE — Progress Notes (Signed)
PROGRESS NOTE    Jason Fitzgerald  OVF:[id number] DOB: [birth year] DOA: 04/29/2020 PCP: Steele Sizer, MD   Chief complaint.  Shortness of breath. Brief Narrative:  Jason Fitzgerald is a 70 y.o. male with medical history significant for hypertension, interstitial lung disease, heart failure reduced ejection fraction, hyperlipidemia, history of atrial fibrillation, presents emergency department by EMS for shortness of breath.  Patient condition has been progressively getting worse over the last week, he has a severe short of breath with minimal exertion, he has been sleeping in a recliner, frequent paroxysmal nocturnal dyspnea.  He also drinks alcohol and he used cocaine. Upon arriving the hospital, he was hypotensive.     Assessment & Plan:   Principal Problem:   Shortness of breath Active Problems:   Arteriosclerosis of coronary artery   HTN (hypertension)   Interstitial lung disease (HCC)   Chronic cough   Degenerative arthritis of lumbar spine   Chronic systolic CHF (congestive heart failure) (HCC)   AF (paroxysmal atrial fibrillation) (HCC)   Rheumatoid arthritis involving multiple sites with positive rheumatoid factor (HCC)   Fibrosis, pulmonary, interstitial, diffuse (HCC)   GERD (gastroesophageal reflux disease)   COPD (chronic obstructive pulmonary disease) (HCC)   CHF (congestive heart failure) (HCC)   Anemia   Atrial fibrillation (HCC)   Non compliance w medication regimen   DNR (do not resuscitate)   Prolonged QT interval   Acute on chronic systolic (congestive) heart failure ([place])   Sepsis ([place])   Aspiration pneumonia of both lower lobes due to gastric secretions ([place])  #1.  Acute on chronic systolic congestive heart failure. --Patient has evidence of volume overload, significant paroxysmal nocturnal dyspnea, leg edema.  He has intermittent hypotension due to severe LV dysfunction.  Ejection fraction less than 20%. --Patient overall condition appears to be  end-stage congestive heart failure.  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 --transition from lasix gtt to oral lasix 20 mg BID, per cardiology --continue Farxiga 109m daily  --resume metoprolol succinate 247mwhen able.  --Hold Entrestod/t hypotension.   Paroxysmal atrial fibrillation. --currently rate controlled --Due to pulmonary fibrosis, cannot be placed on amiodarone. 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 Severe hypoalbuminemia. Intermittent hypotension. Coagulopathy secondary to liver failure. Patient also has liver function changes with elevated bilirubin.  This is due to right-sided congestive heart failure. --cont midodrine Patient condition is very serious, very high risk of mortality.  #2.  Severe sepsis. Aspiration pneumonia. Interstitial lung disease. Patient met sepsis criteria at time admission, he had tachycardia, tachypnea, hypotension, leukocytosis.  He also has significant lactic acidosis. I reviewed the patient chest x-ray and CT scan, patient has diffuse interstitial lung disease. Due to alcohol drinking, patient has high risk of aspiration pneumonia.  Patient also has Pseudomonas risk due to interstitial lung disease.   --cont cefepime for now  4.  Alcohol abuse. Cocaine abuse. Continue alcohol withdrawal protocol.  Continue thiamine and folic acid.  #6.  Severe protein calorie malnutrition. Patient had significant muscle atrophy, looks malnourished.   --cont protein supplement  #7.  Hypokalemia. --monitor and replete PRN  Constipation --Miralax 34g BID   DVT prophylaxis: lovenox Code Status: Family requested change back to Full code Family Communication:  Disposition Plan:  .   Status is: inpatient  The patient will require care spanning > 2 midnights and should be moved to inpatient  because: Inpatient  level of care appropriate due to severity of illness  Dispo: The patient is from: Home              Anticipated d/c is to: Home              Patient currently is not medically stable to d/c.  On IV cefepime, also pending cardiology eval    Difficult to place patient No    I/O last 3 completed shifts: In: 884.6 [P.O.:180; I.V.:20.7; IV Piggyback:684] Out: 650 [Urine:650] Total I/O In: 139.8 [P.O.:120; I.V.:19.8] Out: 64 [Urine:450]     Consultants:   Cardiology  Procedures: None  Antimicrobials: Cefepime  Subjective: Pt complained of heart racing, though no chest pain.  Tele showed HR in 90's.  Nursing reported pt hasn't had BM.   Objective: Vitals:   04/30/20 2326 05/01/20 0012 05/01/20 0438 05/01/20 1423  BP: (!) 81/64 102/67 94/66 92/72   Pulse: 84 100 88 99  Resp:  16 17 20   Temp: 98.1 F (36.7 C) 98.2 F (36.8 C) 97.8 F (36.6 C) 98.7 F (37.1 C)  TempSrc: Oral Oral Oral Oral  SpO2: 97% 99% 98% 100%  Weight:   77.8 kg   Height:        Intake/Output Summary (Last 24 hours) at 05/01/2020 1728 Last data filed at 05/01/2020 1500 Gross per 24 hour  Intake 469.1 ml  Output 1100 ml  Net -630.9 ml   Filed Weights   04/29/20 0925 04/30/20 1203 05/01/20 0438  Weight: 74.8 kg 77.2 kg 77.8 kg    Examination:  Constitutional: NAD, AAOx3 HEENT: conjunctivae and lids normal, EOMI CV: No cyanosis.   RESP: normal respiratory effort, on RA Extremities: No effusions, edema in BLE SKIN: warm, dry Neuro: II - XII grossly intact.   Psych: anxious mood and affect.     Data Reviewed: I have personally reviewed following labs and imaging studies  CBC: Recent Labs  Lab 04/29/20 0934 04/30/20 0449 05/01/20 0528  WBC 17.9* 18.7* 10.6*  NEUTROABS 15.4*  --  8.6*  HGB 15.5 13.2 11.5*  HCT 45.5 38.6* 34.3*  MCV 79.8* 80.4 81.3  PLT 149* 127* 482*   Basic Metabolic Panel: Recent Labs  Lab 04/29/20 0934 04/29/20 1420 04/30/20 0449  05/01/20 0528  NA 133*  --  138 134*  K 3.4*  --  3.4* 3.1*  CL 92*  --  109 94*  CO2 26  --  23 26  GLUCOSE 124*  --  88 108*  BUN 27*  --  27* 32*  CREATININE 0.99  --  0.64 0.89  CALCIUM 8.4*  --  5.6* 7.6*  MG  --  2.3  --  1.7  PHOS  --  2.6  --  2.5   GFR: Estimated Creatinine Clearance: 87.2 mL/min (by C-G formula based on SCr of 0.89 mg/dL). Liver Function Tests: Recent Labs  Lab 04/29/20 0934 04/30/20 0449 05/01/20 0528  AST 51* 39 24  ALT 59* 25 25  ALKPHOS 195* 106 107  BILITOT 5.2* 3.0* 2.7*  PROT 9.1* 4.9* 6.0*  ALBUMIN 2.9* 1.6* 2.8*   Recent Labs  Lab 04/29/20 0934  LIPASE 29   No results for input(s): AMMONIA in the last 168 hours. Coagulation Profile: Recent Labs  Lab 04/30/20 0449  INR 1.6*   Cardiac Enzymes: No results for input(s): CKTOTAL, CKMB, CKMBINDEX, TROPONINI in the last 168 hours. BNP (last 3 results) No results for input(s): PROBNP in the last 8760 hours. HbA1C: No results  for input(s): HGBA1C in the last 72 hours. CBG: No results for input(s): GLUCAP in the last 168 hours. Lipid Profile: No results for input(s): CHOL, HDL, LDLCALC, TRIG, CHOLHDL, LDLDIRECT in the last 72 hours. Thyroid Function Tests: Recent Labs    04/29/20 1420 04/30/20 0449  TSH 7.981*  --   FREET4  --  1.16*   Anemia Panel: No results for input(s): VITAMINB12, FOLATE, FERRITIN, TIBC, IRON, RETICCTPCT in the last 72 hours. Sepsis Labs: Recent Labs  Lab 04/29/20 0934 04/29/20 1043 04/29/20 1220 04/30/20 0449 04/30/20 1242 05/01/20 0528  PROCALCITON  --  3.66  --  2.87  --  3.97  LATICACIDVEN 2.4*  --  3.2*  --  2.7*  --     Recent Results (from the past 240 hour(s))  Culture, blood (routine x 2)     Status: None (Preliminary result)   Collection Time: 04/29/20  9:34 AM   Specimen: BLOOD  Result Value Ref Range Status   Specimen Description BLOOD RIGHT ARM  Final   Special Requests   Final    BOTTLES DRAWN AEROBIC AND ANAEROBIC Blood  Culture adequate volume   Culture   Final    NO GROWTH 2 DAYS Performed at [facility], [address], [place], [place] [address]    Report Status PENDING  Incomplete  Culture, blood (routine x 2)     Status: None (Preliminary result)   Collection Time: 04/29/20  9:34 AM   Specimen: BLOOD  Result Value Ref Range Status   Specimen Description BLOOD BLOOD LEFT ARM  Final   Special Requests   Final    BOTTLES DRAWN AEROBIC AND ANAEROBIC Blood Culture adequate volume   Culture   Final    NO GROWTH 2 DAYS Performed at [facility], [address] [place], [place] [address]    Report Status PENDING  Incomplete  SARS CORONAVIRUS 2 (TAT 6-24 HRS) Nasopharyngeal Nasopharyngeal Swab     Status: None   Collection Time: 04/29/20  9:40 AM   Specimen: Nasopharyngeal Swab  Result Value Ref Range Status   SARS Coronavirus 2 NEGATIVE NEGATIVE Final    Comment: (NOTE) SARS-CoV-2 target nucleic acids are NOT DETECTED.  The SARS-CoV-2 RNA is generally detectable in upper and lower respiratory specimens during the acute phase of infection. Negative results do not preclude SARS-CoV-2 infection, do not rule out co-infections with other pathogens, and should not be used as the sole basis for treatment or other patient management decisions. Negative results must be combined with clinical observations, patient history, and epidemiological information. The expected result is Negative.  Fact Sheet for Patients: SugarRoll.be  Fact Sheet for Healthcare Providers: https://www.woods-mathews.com/  This test is not yet approved or cleared by the [place] FDA and  has been authorized for detection and/or diagnosis of SARS-CoV-2 by FDA under an Emergency Use Authorization (EUA). This EUA will remain  in effect (meaning this test can be used) for the duration of the COVID-19 declaration under Se ction 564(b)(1) of the Act, 21 U.S.C. section  360bbb-3(b)(1), unless the authorization is terminated or revoked sooner.  Performed at [facility], [place] [address], [place], [place] [address]          Radiology Studies: No results found.      Scheduled Meds: . atorvastatin  40 mg Oral Daily  . calcium carbonate  400 mg of elemental calcium Oral TID WC  . dapagliflozin propanediol  10 mg Oral Daily  . feeding supplement  237 mL Oral BID BM  . folic acid  1 mg Oral Daily  . furosemide  20 mg Oral BID  . midodrine  10 mg Oral TID WC  . multivitamin with minerals  1 tablet Oral Daily  . polyethylene glycol  34 g Oral BID  . thiamine  100 mg Oral Daily   Or  . thiamine  100 mg Intravenous Daily   Continuous Infusions: . ceFEPime (MAXIPIME) IV       LOS: 1 day     Enzo Bi, MD [facility]   To contact the attending provider between 7A-7P or the covering provider during after hours 7P-7A, please log into the web site [email or web address] and access using universal [place] password for that web site. If you do not have the password, please call the hospital operator.  05/01/2020, 5:28 PM

## 2020-05-01 NOTE — NC FL2 (Signed)
[place] LEVEL OF CARE SCREENING TOOL     IDENTIFICATION  Patient Name: Jason Fitzgerald Birthdate: [birth year] Sex: male Admission Date (Current Location): 04/29/2020  [place] and [place] Number:  Engineering geologist and Address:  [facility], [address] [place], [place] [address]      Provider Number: [id number]  Attending Physician Name and Address:  Enzo Bi, MD  Relative Name and Phone Number:       Current Level of Care: Hospital Recommended Level of Care: [place] Prior Approval Number:    Date Approved/Denied:   PASRR Number: [id number]  Discharge Plan: SNF    Current Diagnoses: Patient Active Problem List   Diagnosis Date Noted  . Acute on chronic systolic (congestive) heart failure ([place]) 04/30/2020  . Sepsis ([place]) 04/30/2020  . Aspiration pneumonia of both lower lobes due to gastric secretions ([place]) 04/30/2020  . Shortness of breath 04/29/2020  . Prolonged QT interval 04/29/2020  . DNR (do not resuscitate) 04/22/2020  . Atrial fibrillation with rapid ventricular response ([place]) 04/18/2020  . Non compliance w medication regimen   . Atrial fibrillation ([place]) 12/09/2019  . Atrial fibrillation with RVR ([place]) 12/08/2019  . Slurred speech 12/08/2019  . Chest pain 12/08/2019  . Elevated troponin 12/08/2019  . Acute CHF (congestive heart failure) ([place]) 08/05/2019  . Diarrhea   . Dizziness 12/17/2018  . Hypotension 12/17/2018  . Hypophosphatemia 12/17/2018  . Hypomagnesemia 12/17/2018  . Positive D-dimer 12/17/2018  . Abnormal LFTs (liver function tests) 12/17/2018  . Anemia 12/17/2018  . Fibrosis, pulmonary, interstitial, diffuse ([place])   . GERD (gastroesophageal reflux disease)   . COPD (chronic obstructive pulmonary disease) ([place])   . CHF (congestive heart failure) ([place])   . Encounter for screening colonoscopy   . Polyp of transverse colon   . Internal hemorrhoids   . Hypokalemia 02/03/2018   . History of alcoholism ([place]) 12/08/2017  . Rheumatoid arthritis involving multiple sites with positive rheumatoid factor ([place]) 10/13/2017  . Chronic systolic CHF (congestive heart failure) ([place]) 03/12/2017  . AF (paroxysmal atrial fibrillation) ([place]) 03/12/2017  . Arthritis of left knee 08/01/2016  . 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 Chronic cough 05/29/2015  . CAFL (chronic airflow limitation) ([place]) 10/24/2014  . Arteriosclerosis of coronary artery 10/24/2014  . History of fundoplication 93/81/0175  . HTN (hypertension) 10/24/2014  . LAD (lymphadenopathy), mediastinal 07/13/2013  . Interstitial lung disease ([place]) 06/19/2013  . 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    Orientation RESPIRATION BLADDER Height & Weight     Self,Time,Situation,Place  Normal Continent Weight: 171 lb 8.3 oz (77.8 kg) Height:  6' (182.9 cm)  BEHAVIORAL SYMPTOMS/MOOD NEUROLOGICAL BOWEL NUTRITION STATUS      Incontinent Diet (heart healthy, thin liquids, 1500 mL fluid restriction)  AMBULATORY STATUS COMMUNICATION OF NEEDS Skin   Extensive Assist Verbally Normal                       Personal Care Assistance Level of Assistance  Bathing,Feeding,Dressing Bathing Assistance: Maximum assistance Feeding assistance: Limited assistance Dressing Assistance: Maximum assistance     Functional Limitations Info  Sight,Hearing,Speech Sight Info: Adequate Hearing Info: Adequate Speech Info: Adequate    SPECIAL CARE FACTORS FREQUENCY  PT (By licensed PT),OT (By licensed OT)     PT Frequency: 5x OT Frequency: 5x            Contractures Contractures Info: Not present    Additional Factors Info  Code Status,Allergies Code Status Info: Full Code Allergies Info: no known allergies           Current Medications (05/01/2020):  This is the current hospital active medication list Current Facility-Administered Medications  Medication Dose Route Frequency Provider  Last Rate Last Admin  . acetaminophen (TYLENOL) tablet 325 mg  325 mg Oral Q6H PRN Cox, Amy N, DO   325 mg at 04/30/20 2230   Or  . acetaminophen (TYLENOL) suppository 325 mg  325 mg Rectal Q6H PRN Cox, Amy N, DO      . atorvastatin (LIPITOR) tablet 40 mg  40 mg Oral Daily Cox, Amy N, DO   40 mg at 05/01/20 1008  . calcium carbonate (TUMS - dosed in mg elemental calcium) chewable tablet 400 mg of elemental calcium  400 mg of elemental calcium Oral TID WC Sharen Hones, MD   400 mg of elemental calcium at 05/01/20 1256  . ceFEPIme (MAXIPIME) 2 g in sodium chloride 0.9 % 100 mL IVPB  2 g Intravenous Q8H Patel, Kishan S, RPH      . dapagliflozin propanediol (FARXIGA) tablet 10 mg  10 mg Oral Daily Cox, Amy N, DO   10 mg at 05/01/20 1009  . feeding supplement (ENSURE ENLIVE / ENSURE PLUS) liquid 237 mL  237 mL Oral BID BM Sharen Hones, MD   237 mL at 05/01/20 1006  . folic acid (FOLVITE) tablet 1 mg  1 mg Oral Daily Cox, Amy N, DO   1 mg at 05/01/20 1008  . furosemide (LASIX) tablet 20 mg  20 mg Oral BID Adaline Sill, NP      . LORazepam (ATIVAN) tablet 1-4 mg  1-4 mg Oral Q1H PRN Cox, Amy N, DO       Or  . LORazepam (ATIVAN) injection 1-4 mg  1-4 mg Intravenous Q1H PRN Cox, Amy N, DO      . midodrine (PROAMATINE) tablet 10 mg  10 mg Oral TID WC Cox, Amy N, DO   10 mg at 05/01/20 1255  . multivitamin with minerals tablet 1 tablet  1 tablet Oral Daily Cox, Amy N, DO   1 tablet at 05/01/20 1008  . ondansetron (ZOFRAN) tablet 4 mg  4 mg Oral Q6H PRN Cox, Amy N, DO       Or  . ondansetron (ZOFRAN) injection 4 mg  4 mg Intravenous Q6H PRN Cox, Amy N, DO   4 mg at 05/01/20 1056  . polyethylene glycol (MIRALAX / GLYCOLAX) packet 34 g  34 g Oral BID Enzo Bi, MD   34 g at 05/01/20 1255  . thiamine tablet 100 mg  100 mg Oral Daily Cox, Amy N, DO   100 mg at 05/01/20 1009   Or  . thiamine (B-1) injection 100 mg  100 mg Intravenous Daily Cox, Amy N, DO   100 mg at 04/29/20 1402     Discharge  Medications: Please see discharge summary for a list of discharge medications.  Relevant Imaging Results:  Relevant Lab Results:   Additional Information [id number]  Eileen Stanford, LCSW

## 2020-05-01 NOTE — TOC Initial Note (Signed)
Transition of Care [facility]) - Initial/Assessment Note    Patient Details  Name: Jason Fitzgerald MRN: [id number] Date of Birth: [birth year]  [facility] CM/SW Contact:    Eileen Stanford, LCSW Phone Number: 05/01/2020, 1:51 PM  Clinical Narrative:    CSW spoke with pt and pt's daughter at bedside. Pt and pt's daughter is agreeable for pt to go to SNF. The SNF preference is [facility]. Pt's sister takes him to doctor appointments. Pt sees Dr Ancil Boozer. [facility] uses [facility] in [place].  Pt asking CSW to delete contact in emergency contacts and only have Regino Schultze and add Dwain Sarna (daughter). CSW completed.  CSW to send referral to [facility] .           Expected Discharge Plan: Skilled Nursing Facility Barriers to Discharge: Continued Medical Work up   Patient Goals and CMS Choice Patient states their goals for this hospitalization and ongoing recovery are:: to get better   Choice offered to / list presented to : [place]  Expected Discharge Plan and Services Expected Discharge Plan: [place] In-house Referral: Clinical Social Work   Post Acute Care Choice: [place] Living arrangements for the past 2 months: [place]                                      Prior Living Arrangements/Services Living arrangements for the past 2 months: Single Family Home Lives with:: Self Patient language and need for interpreter reviewed:: Yes Do you feel safe going back to the place where you live?: Yes      Need for Family Participation in Patient Care: Yes (Comment) Care giver support system in place?: Yes (comment)   Criminal Activity/Legal Involvement Pertinent to Current Situation/Hospitalization: No - Comment as needed  Activities of Daily Living Home Assistive Devices/Equipment: Walker (specify type) ADL Screening (condition at time of admission) Patient's cognitive ability adequate to safely complete daily  activities?: Yes Is the patient deaf or have difficulty hearing?: No Does the patient have difficulty seeing, even when wearing glasses/contacts?: No Does the patient have difficulty concentrating, remembering, or making decisions?: No Patient able to express need for assistance with ADLs?: Yes Does the patient have difficulty dressing or bathing?: No Independently performs ADLs?: Yes (appropriate for developmental age) Does the patient have difficulty walking or climbing stairs?: Yes Weakness of Legs: Both Weakness of Arms/Hands: None  Permission Sought/Granted Permission sought to share information with : Family Supports Permission granted to share information with : Yes, Verbal Permission Granted  Share Information with NAME: Dwain Sarna  Permission granted to share info w AGENCY: Dietitian granted to share info w Relationship: daughter     Emotional Assessment Appearance:: Appears stated age Attitude/Demeanor/Rapport: Engaged Affect (typically observed): Accepting Orientation: : Oriented to Place,Oriented to Self,Oriented to  Time,Oriented to Situation Alcohol / Substance Use: Not Applicable Psych Involvement: No (comment)  Admission diagnosis:  Shortness of breath [R06.02] SOB (shortness of breath) [R06.02] Acute on chronic systolic congestive heart failure (HCC) [I50.23] Atrial fibrillation with RVR (HCC) [I48.91] Sepsis without acute organ dysfunction, due to unspecified organism ([place]) [A41.9] Acute on chronic systolic (congestive) heart failure ([place]) [I50.23] Patient Active Problem List   Diagnosis Date Noted  . Acute on chronic systolic (congestive) heart failure ([place]) 04/30/2020  . Sepsis ([place]) 04/30/2020  . Aspiration pneumonia of both lower lobes due to gastric secretions ([place])  04/30/2020  . Shortness of breath 04/29/2020  . Prolonged QT interval 04/29/2020  . DNR (do not resuscitate) 04/22/2020  . Atrial fibrillation with rapid ventricular response  ([place]) 04/18/2020  . Non compliance w medication regimen   . Atrial fibrillation ([place]) 12/09/2019  . Atrial fibrillation with RVR ([place]) 12/08/2019  . Slurred speech 12/08/2019  . Chest pain 12/08/2019  . Elevated troponin 12/08/2019  . Acute CHF (congestive heart failure) ([place]) 08/05/2019  . Diarrhea   . Dizziness 12/17/2018  . Hypotension 12/17/2018  . Hypophosphatemia 12/17/2018  . Hypomagnesemia 12/17/2018  . Positive D-dimer 12/17/2018  . Abnormal LFTs (liver function tests) 12/17/2018  . Anemia 12/17/2018  . Fibrosis, pulmonary, interstitial, diffuse ([place])   . GERD (gastroesophageal reflux disease)   . COPD (chronic obstructive pulmonary disease) ([place])   . CHF (congestive heart failure) ([place])   . Encounter for screening colonoscopy   . Polyp of transverse colon   . Internal hemorrhoids   . Hypokalemia 02/03/2018  . History of alcoholism ([place]) 12/08/2017  . Rheumatoid arthritis involving multiple sites with positive rheumatoid factor ([place]) 10/13/2017  . Chronic systolic CHF (congestive heart failure) ([place]) 03/12/2017  . AF (paroxysmal atrial fibrillation) ([place]) 03/12/2017  . Arthritis of left knee 08/01/2016  . 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 Chronic cough 05/29/2015  . CAFL (chronic airflow limitation) ([place]) 10/24/2014  . Arteriosclerosis of coronary artery 10/24/2014  . History of fundoplication 10/62/6948  . HTN (hypertension) 10/24/2014  . LAD (lymphadenopathy), mediastinal 07/13/2013  . Interstitial lung disease ([place]) 06/19/2013  . 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   PCP:  Steele Sizer, MD Pharmacy:   [id number] [place], [place] - [address] [place] [address] Phone: [phone] Fax: [phone]     Social Determinants of Health (SDOH) Interventions    Readmission Risk Interventions Readmission Risk Prevention Plan 05/01/2020 12/12/2019 12/10/2019  Transportation Screening Complete  Complete Complete  PCP or Specialist Appt within 3-5 Days - Complete Complete  HRI or Home Care Consult - Complete Complete  Social Work Consult for [place] Planning/Counseling - Complete Complete  Palliative Care Screening - Complete Not Applicable  Medication Review Press photographer) Complete Complete Complete  PCP or Specialist appointment within 3-5 days of discharge Complete - -  [place] or Home Care Consult Complete - -  SW Recovery Care/Counseling Consult Complete - -  Palliative Care Screening Not Applicable - -  [place] Complete - -  Some recent data might be hidden

## 2020-05-01 NOTE — Consult Note (Signed)
Jason Fitzgerald is a 70 y.o. male  [id number]  Primary Cardiologist: Neoma Laming Reason for Consultation: A fib / HFrEF  HPI: Patient is a 70 year old male with past medical history of combined HFrEF/HFpEF, NICM, COPD, PAF, and ILD presenting to the emergency room with worsening dyspnea. Patient was discharged 1 week prior d/t worsening HFrEF and atrial fibrillation with RVR. Patient has remained non-compliant and did not take his medications once discharged. Patient has also continued to drink 1/5 of liquor daily and recently had a party where he took cocaine.  We have been consulted for HFrEF and atrial fibrillation management.   Review of Systems: Patient denies chest pain but attests to dyspnea, orthopnea, and N/V.   Past Medical History:  Diagnosis Date  . CHF (congestive heart failure) ([place])   . Chronic cough   . COPD (chronic obstructive pulmonary disease) ([place])   . Elevated liver function tests   . Emphysema of lung ([place])   . Fibrosis, pulmonary, interstitial, diffuse ([place])   . GERD (gastroesophageal reflux disease)   . History of cocaine abuse ([place])   . Mediastinal lymphadenopathy   . Pulmonary fibrosis ([place])   . Right inguinal hernia     Medications Prior to Admission  Medication Sig Dispense Refill  . acetaminophen (TYLENOL) 325 MG tablet Take 2 tablets (650 mg total) by mouth every 4 (four) hours as needed for headache or mild pain.    . Multiple Vitamin (MULTIVITAMIN WITH MINERALS) TABS tablet Take 1 tablet by mouth daily. 30 tablet 0  . thiamine 100 MG tablet Take 1 tablet (100 mg total) by mouth daily. 30 tablet 0  . atorvastatin (LIPITOR) 40 MG tablet Take 1 tablet (40 mg total) by mouth daily. 30 tablet 0  . folic acid (FOLVITE) 1 MG tablet Take 1 tablet (1 mg total) by mouth daily. 30 tablet 0  . furosemide (LASIX) 20 MG tablet Take 1 tablet (20 mg total) by mouth daily as needed. Take if your weight rises by 3 lb in 2 days 30 tablet 11  . metoprolol  succinate (TOPROL-XL) 25 MG 24 hr tablet Take 1 tablet (25 mg total) by mouth daily. 30 tablet 0  . potassium chloride SA (KLOR-CON) 20 MEQ tablet Take 1 tablet (20 mEq total) by mouth daily as needed. Take only when taking Lasix 30 tablet 0     . atorvastatin  40 mg Oral Daily  . calcium carbonate  400 mg of elemental calcium Oral TID WC  . dapagliflozin propanediol  10 mg Oral Daily  . feeding supplement  237 mL Oral BID BM  . folic acid  1 mg Oral Daily  . midodrine  10 mg Oral TID WC  . multivitamin with minerals  1 tablet Oral Daily  . polyethylene glycol  34 g Oral BID  . rivaroxaban  20 mg Oral Daily  . thiamine  100 mg Oral Daily   Or  . thiamine  100 mg Intravenous Daily    Infusions: . ceFEPime (MAXIPIME) IV    . furosemide (LASIX) 200 mg in dextrose 5% 100 mL (2mg /mL) infusion 4 mg/hr (04/30/20 1857)    No Known Allergies  Social History   Socioeconomic History  . Marital status: Divorced    Spouse name: Not on file  . Number of children: 4  . Years of education: Not on file  . Highest education level: Not on file  Occupational History  . Occupation: retired   Tobacco Use  .  Smoking status: Never Smoker  . Smokeless tobacco: Never Used  Vaping Use  . Vaping Use: Never used  Substance and Sexual Activity  . Alcohol use: Not Currently    Alcohol/week: 0.0 standard drinks    Comment: 2 quarts a week, he quit again 12/2018  . Drug use: Not Currently    Types: Cocaine    Comment: as an young adult   . Sexual activity: Not Currently  Other Topics Concern  . Not on file  Social History Narrative   Retired Summer of 2019   Lives alone   He had 4 children. One son was killed years ago, but has 3 living children    Social Determinants of Radio broadcast assistant Strain: Not on file  Food Insecurity: Not on file  Transportation Needs: Not on file  Physical Activity: Not on file  Stress: Not on file  Social Connections: Not on file  Intimate Partner  Violence: Not on file    Family History  Problem Relation Age of Onset  . Diabetes Mother   . Cancer Father   . AAA (abdominal aortic aneurysm) Brother     PHYSICAL EXAM: Vitals:   05/01/20 0012 05/01/20 0438  BP: 102/67 94/66  Pulse: 100 88  Resp: 16 17  Temp: 98.2 F (36.8 C) 97.8 F (36.6 C)  SpO2: 99% 98%     Intake/Output Summary (Last 24 hours) at 05/01/2020 1243 Last data filed at 05/01/2020 1026 Gross per 24 hour  Intake 810.39 ml  Output 650 ml  Net 160.39 ml    General:  Well appearing. No respiratory difficulty HEENT: normal Neck: supple. no JVD. Carotids 2+ bilat; no bruits. No lymphadenopathy or thryomegaly appreciated. Cor: PMI nondisplaced. Regular rate & rhythm. No rubs, gallops or murmurs. Lungs: clear Abdomen: soft, nontender, nondistended. No hepatosplenomegaly. No bruits or masses. Good bowel sounds. Extremities: no cyanosis, clubbing, rash, edema Neuro: alert & oriented x 3, cranial nerves grossly intact. moves all 4 extremities w/o difficulty. Affect pleasant.  ECG: 3/28 - Atrial fibrillation with RVR. 131/bpm Telemetry - 3/30 - Atrial fibrillation 94/bpm  Results for orders placed or performed during the hospital encounter of 04/29/20 (from the past 24 hour(s))  Procalcitonin     Status: None   Collection Time: 05/01/20  5:28 AM  Result Value Ref Range   Procalcitonin 3.97 ng/mL  CBC with Differential/Platelet     Status: Abnormal   Collection Time: 05/01/20  5:28 AM  Result Value Ref Range   WBC 10.6 (H) 4.0 - 10.5 K/uL   RBC 4.22 4.22 - 5.81 MIL/uL   Hemoglobin 11.5 (L) 13.0 - 17.0 g/dL   HCT 34.3 (L) 39.0 - 52.0 %   MCV 81.3 80.0 - 100.0 fL   MCH 27.3 26.0 - 34.0 pg   MCHC 33.5 30.0 - 36.0 g/dL   RDW 15.5 11.5 - 15.5 %   Platelets 115 (L) 150 - 400 K/uL   nRBC 0.0 0.0 - 0.2 %   Neutrophils Relative % 82 %   Neutro Abs 8.6 (H) 1.7 - 7.7 K/uL   Lymphocytes Relative 10 %   Lymphs Abs 1.1 0.7 - 4.0 K/uL   Monocytes Relative 7 %    Monocytes Absolute 0.7 0.1 - 1.0 K/uL   Eosinophils Relative 0 %   Eosinophils Absolute 0.0 0.0 - 0.5 K/uL   Basophils Relative 0 %   Basophils Absolute 0.0 0.0 - 0.1 K/uL   Immature Granulocytes 1 %   Abs Immature  Granulocytes 0.10 (H) 0.00 - 0.07 K/uL  Comprehensive metabolic panel     Status: Abnormal   Collection Time: 05/01/20  5:28 AM  Result Value Ref Range   Sodium 134 (L) 135 - 145 mmol/L   Potassium 3.1 (L) 3.5 - 5.1 mmol/L   Chloride 94 (L) 98 - 111 mmol/L   CO2 26 22 - 32 mmol/L   Glucose, Bld 108 (H) 70 - 99 mg/dL   BUN 32 (H) 8 - 23 mg/dL   Creatinine, Ser 0.89 0.61 - 1.24 mg/dL   Calcium 7.6 (L) 8.9 - 10.3 mg/dL   Total Protein 6.0 (L) 6.5 - 8.1 g/dL   Albumin 2.8 (L) 3.5 - 5.0 g/dL   AST 24 15 - 41 U/L   ALT 25 0 - 44 U/L   Alkaline Phosphatase 107 38 - 126 U/L   Total Bilirubin 2.7 (H) 0.3 - 1.2 mg/dL   GFR, Estimated >60 >60 mL/min   Anion gap 14 5 - 15  Magnesium     Status: None   Collection Time: 05/01/20  5:28 AM  Result Value Ref Range   Magnesium 1.7 1.7 - 2.4 mg/dL  Phosphorus     Status: None   Collection Time: 05/01/20  5:28 AM  Result Value Ref Range   Phosphorus 2.5 2.5 - 4.6 mg/dL  Cortisol-am, blood     Status: Abnormal   Collection Time: 05/01/20  5:50 AM  Result Value Ref Range   Cortisol - AM 24.9 (H) 6.7 - 22.6 ug/dL   No results found.   ASSESSMENT AND PLAN: Patient presenting to the emergency department with worsening dyspnea.Atrial fibrillation currently rate controlled. If becomes elevated again, recommend low dose metoprolol. Due to pulmonary fibrosis, cannot be placed on amiodarone. With continued alcohol abuse and noncompliance, hold previously prescribed Xarelto.Kidney function stable. With HFrEF continue Farxiga 10mg  daily and resume metoprolol succinate 25mg  when able. Hold Entresto d/t hypotension. Recommend transitioning Furosemide infusion to oral dosing. Patient with end stage HFrEF and worsening ILD, recommend palliative  consult. Will continue to follow  Adaline Sill NP-C

## 2020-05-02 DIAGNOSIS — R0602 Shortness of breath: Secondary | ICD-10-CM | POA: Diagnosis not present

## 2020-05-02 LAB — CBC
HCT: 31.8 % — ABNORMAL LOW (ref 39.0–52.0)
Hemoglobin: 10.9 g/dL — ABNORMAL LOW (ref 13.0–17.0)
MCH: 27 pg (ref 26.0–34.0)
MCHC: 34.3 g/dL (ref 30.0–36.0)
MCV: 78.7 fL — ABNORMAL LOW (ref 80.0–100.0)
Platelets: 150 10*3/uL (ref 150–400)
RBC: 4.04 MIL/uL — ABNORMAL LOW (ref 4.22–5.81)
RDW: 15 % (ref 11.5–15.5)
WBC: 9.3 10*3/uL (ref 4.0–10.5)
nRBC: 0 % (ref 0.0–0.2)

## 2020-05-02 LAB — MAGNESIUM: Magnesium: 2.1 mg/dL (ref 1.7–2.4)

## 2020-05-02 LAB — BASIC METABOLIC PANEL
Anion gap: 14 (ref 5–15)
BUN: 39 mg/dL — ABNORMAL HIGH (ref 8–23)
CO2: 23 mmol/L (ref 22–32)
Calcium: 8.2 mg/dL — ABNORMAL LOW (ref 8.9–10.3)
Chloride: 96 mmol/L — ABNORMAL LOW (ref 98–111)
Creatinine, Ser: 1.25 mg/dL — ABNORMAL HIGH (ref 0.61–1.24)
GFR, Estimated: >60 mL/min (ref >60)
Glucose, Bld: 118 mg/dL — ABNORMAL HIGH (ref 70–99)
Potassium: 3.7 mmol/L (ref 3.5–5.1)
Sodium: 133 mmol/L — ABNORMAL LOW (ref 135–145)

## 2020-05-02 MED ORDER — ACETAMINOPHEN 500 MG PO TABS
1000.0000 mg | ORAL_TABLET | Freq: Three times a day (TID) | ORAL | Status: DC | PRN
  Administered 2020-05-03 – 2020-05-13 (×11): 1000 mg via ORAL
  Filled 2020-05-02 (×11): qty 2

## 2020-05-02 MED ORDER — COLLAGENASE 250 UNIT/GM EX OINT
TOPICAL_OINTMENT | Freq: Every day | CUTANEOUS | Status: DC
  Filled 2020-05-02 (×2): qty 30

## 2020-05-02 MED ORDER — METOPROLOL TARTRATE 25 MG PO TABS
12.5000 mg | ORAL_TABLET | Freq: Two times a day (BID) | ORAL | Status: DC
  Administered 2020-05-03: 12.5 mg via ORAL
  Filled 2020-05-02 (×2): qty 1

## 2020-05-02 NOTE — Progress Notes (Signed)
PT Cancellation Note  Patient Details Name: Jason Fitzgerald MRN: [id number] DOB: 08-Aug-1950   Cancelled Treatment:    Reason Eval/Treat Not Completed: Patient declined, no reason specified.  Patient reports "they have been messing with my sores all day." Declines exercises or oob activity tat this time. Will re-attempt tomorrow.     Vinita Prentiss 05/02/2020, 2:07 PM

## 2020-05-02 NOTE — Progress Notes (Signed)
SUBJECTIVE: Patient resting in bed. Continues to be shortness of breath, but denies chest pain. Has complaints of soreness from his pressure ulcers.   Vitals:   05/01/20 2029 05/02/20 0424 05/02/20 0500 05/02/20 0730  BP: 99/83 101/73  98/78  Pulse: 96 98  77  Resp: 19 18  16   Temp: 98 F (36.7 C) 98.1 F (36.7 C)  97.7 F (36.5 C)  TempSrc: Oral Oral  Oral  SpO2: 99% 99%  93%  Weight:   77.6 kg   Height:        Intake/Output Summary (Last 24 hours) at 05/02/2020 1048 Last data filed at 05/02/2020 1000 Gross per 24 hour  Intake 494.01 ml  Output 1050 ml  Net -555.99 ml    LABS: Basic Metabolic Panel: Recent Labs    04/29/20 1420 04/30/20 0449 05/01/20 0528 05/02/20 0715  NA  --    < > 134* 133*  K  --    < > 3.1* 3.7  CL  --    < > 94* 96*  CO2  --    < > 26 23  GLUCOSE  --    < > 108* 118*  BUN  --    < > 32* 39*  CREATININE  --    < > 0.89 1.25*  CALCIUM  --    < > 7.6* 8.2*  MG 2.3  --  1.7 2.1  PHOS 2.6  --  2.5  --    < > = values in this interval not displayed.   Liver Function Tests: Recent Labs    04/30/20 0449 05/01/20 0528  AST 39 24  ALT 25 25  ALKPHOS 106 107  BILITOT 3.0* 2.7*  PROT 4.9* 6.0*  ALBUMIN 1.6* 2.8*   No results for input(s): LIPASE, AMYLASE in the last 72 hours. CBC: Recent Labs    05/01/20 0528 05/02/20 0715  WBC 10.6* 9.3  NEUTROABS 8.6*  --   HGB 11.5* 10.9*  HCT 34.3* 31.8*  MCV 81.3 78.7*  PLT 115* 150   Cardiac Enzymes: No results for input(s): CKTOTAL, CKMB, CKMBINDEX, TROPONINI in the last 72 hours. BNP: Invalid input(s): POCBNP D-Dimer: No results for input(s): DDIMER in the last 72 hours. Hemoglobin A1C: No results for input(s): HGBA1C in the last 72 hours. Fasting Lipid Panel: No results for input(s): CHOL, HDL, LDLCALC, TRIG, CHOLHDL, LDLDIRECT in the last 72 hours. Thyroid Function Tests: Recent Labs    04/29/20 1420  TSH 7.981*   Anemia Panel: No results for input(s): VITAMINB12, FOLATE,  FERRITIN, TIBC, IRON, RETICCTPCT in the last 72 hours.   PHYSICAL EXAM General: Well developed, well nourished, in no acute distress HEENT:  Normocephalic and atramatic Neck:  No JVD.  Lungs: Tight airways to auscultation Heart: Atrial Fibrillation  Abdomen: Bowel sounds are positive, abdomen soft and non-tender  Msk:  Back normal, normal gait. Normal strength and tone for age. Extremities: No clubbing, cyanosis or edema.   Neuro: Alert and oriented X 3. Psych:  Good affect, responds appropriately  TELEMETRY: Atrial fibrillation 88/bpm  ASSESSMENT AND PLAN: Patient presenting to the emergency department with worsening dyspnea.Atrial fibrillation continues to be rate controlled. If becomes elevated again, recommend low dose metoprolol.Due to pulmonary fibrosis, cannot be placed on amiodarone. With continued alcohol abuse and noncompliance, continue to hold Xarelto.Kidney function worsening today from Cr 0.89 to 1.25. With HFrEF continue Farxiga 10mg  daily. With continued hypotension continue to hold Entrestoand metoprolol. Patient with end stage HFrEF and worsening ILD,  recommend palliative consult.Will continue to follow.  Principal Problem:   Shortness of breath Active Problems:   Arteriosclerosis of coronary artery   HTN (hypertension)   Interstitial lung disease (HCC)   Chronic cough   Degenerative arthritis of lumbar spine   Chronic systolic CHF (congestive heart failure) (HCC)   AF (paroxysmal atrial fibrillation) (HCC)   Rheumatoid arthritis involving multiple sites with positive rheumatoid factor (HCC)   Fibrosis, pulmonary, interstitial, diffuse (HCC)   GERD (gastroesophageal reflux disease)   COPD (chronic obstructive pulmonary disease) (HCC)   CHF (congestive heart failure) (HCC)   Anemia   Atrial fibrillation (HCC)   Non compliance w medication regimen   DNR (do not resuscitate)   Prolonged QT interval   Acute on chronic systolic (congestive) heart failure  ([place])   Sepsis ([place])   Aspiration pneumonia of both lower lobes due to gastric secretions ([place])    Adaline Sill, NP-C 05/02/2020 10:48 AM

## 2020-05-02 NOTE — Plan of Care (Signed)
  Problem: Clinical Measurements: Goal: Will remain free from infection Outcome: Progressing Goal: Respiratory complications will improve Outcome: Progressing   Problem: Coping: Goal: Level of anxiety will decrease Outcome: Progressing   

## 2020-05-02 NOTE — Consult Note (Signed)
SURGICAL CONSULTATION NOTE   HISTORY OF PRESENT ILLNESS (HPI):  70 y.o. male presented to [facility] ED for evaluation of shortness of breath. Patient reports no able to give a good history of his ulcers.  He reported that he has been having back pain upper and lower the last few weeks.  Patient reported that he was told that he have bedsores but he has not been able to stated.  As per chart patient has been admitted recently multiple times due to worsening shortness of breath, acute over chronic heart failure and worsening interstitial lung disease.  The patient reported that he has now been ambulating as much due to exacerbation of his chronic disease.  He does report diet with help and a walker he can ambulate.  He was evaluated by wound care nurse today that found to ulcers 1 on his sacral area and another one on the left scapular area assess as unstageable.  Wound care nurse recommended surgical management.  Surgery is consulted by Dr. Billie Ruddy in this context for evaluation and management of pressure ulcers.  PAST MEDICAL HISTORY (PMH):  Past Medical History:  Diagnosis Date  . CHF (congestive heart failure) ([place])   . Chronic cough   . COPD (chronic obstructive pulmonary disease) ([place])   . Elevated liver function tests   . Emphysema of lung ([place])   . Fibrosis, pulmonary, interstitial, diffuse ([place])   . GERD (gastroesophageal reflux disease)   . History of cocaine abuse ([place])   . Mediastinal lymphadenopathy   . Pulmonary fibrosis ([place])   . Right inguinal hernia      PAST SURGICAL HISTORY ([place]):  Past Surgical History:  Procedure Laterality Date  . COLONOSCOPY WITH PROPOFOL N/A 08/19/2018   Procedure: COLONOSCOPY WITH PROPOFOL;  Surgeon: Virgel Manifold, MD;  Location: [facility] ENDOSCOPY;  Service: Endoscopy;  Laterality: N/A;  . HERNIA REPAIR Right 1973   open  . INGUINAL HERNIA REPAIR Right 11/08/2014   Procedure: LAPAROSCOPIC RIGHT INGUINAL HERNIA REPAIR;  Surgeon: Hubbard Robinson, MD;  Location: [facility] ORS;  Service: General;  Laterality: Right;  . knee arthroscopy Right 1972  . RIGHT/LEFT HEART CATH AND CORONARY ANGIOGRAPHY Right 03/05/2017   Procedure: RIGHT/LEFT HEART CATH AND CORONARY ANGIOGRAPHY;  Surgeon: Dionisio David, MD;  Location: [facility];  Service: Cardiovascular;  Laterality: Right;     MEDICATIONS:  Prior to Admission medications   Medication Sig Start Date End Date Taking? Authorizing Provider  acetaminophen (TYLENOL) 325 MG tablet Take 2 tablets (650 mg total) by mouth every 4 (four) hours as needed for headache or mild pain. 04/22/20  Yes Debbe Odea, MD  Multiple Vitamin (MULTIVITAMIN WITH MINERALS) TABS tablet Take 1 tablet by mouth daily. 04/22/20  Yes Debbe Odea, MD  thiamine 100 MG tablet Take 1 tablet (100 mg total) by mouth daily. 04/22/20  Yes Debbe Odea, MD  atorvastatin (LIPITOR) 40 MG tablet Take 1 tablet (40 mg total) by mouth daily. 04/22/20   Debbe Odea, MD  folic acid (FOLVITE) 1 MG tablet Take 1 tablet (1 mg total) by mouth daily. 04/22/20   Debbe Odea, MD  furosemide (LASIX) 20 MG tablet Take 1 tablet (20 mg total) by mouth daily as needed. Take if your weight rises by 3 lb in 2 days 04/22/20 04/22/21  Debbe Odea, MD  metoprolol succinate (TOPROL-XL) 25 MG 24 hr tablet Take 1 tablet (25 mg total) by mouth daily. 04/22/20   Debbe Odea, MD  potassium chloride SA (  KLOR-CON) 20 MEQ tablet Take 1 tablet (20 mEq total) by mouth daily as needed. Take only when taking Lasix 04/22/20   Debbe Odea, MD     ALLERGIES:  No Known Allergies   SOCIAL HISTORY:  Social History   Socioeconomic History  . Marital status: Divorced    Spouse name: Not on file  . Number of children: 4  . Years of education: Not on file  . Highest education level: Not on file  Occupational History  . Occupation: retired   Tobacco Use  . Smoking status: Never Smoker  . Smokeless tobacco: Never Used  Vaping Use  . Vaping Use: Never  used  Substance and Sexual Activity  . Alcohol use: Not Currently    Alcohol/week: 0.0 standard drinks    Comment: 2 quarts a week, he quit again 12/2018  . Drug use: Not Currently    Types: Cocaine    Comment: as an young adult   . Sexual activity: Not Currently  Other Topics Concern  . Not on file  Social History Narrative   Retired Summer of 2019   Lives alone   He had 4 children. One son was killed years ago, but has 3 living children    Social Determinants of Radio broadcast assistant Strain: Not on file  Food Insecurity: Not on file  Transportation Needs: Not on file  Physical Activity: Not on file  Stress: Not on file  Social Connections: Not on file  Intimate Partner Violence: Not on file      FAMILY HISTORY:  Family History  Problem Relation Age of Onset  . Diabetes Mother   . Cancer Father   . AAA (abdominal aortic aneurysm) Brother      REVIEW OF SYSTEMS:  Constitutional: denies weight loss, fever, chills, or sweats  Eyes: denies any other vision changes, history of eye injury  ENT: denies sore throat, hearing problems  Respiratory: Positive shortness of breath, wheezing  Cardiovascular: Positive denies chest pain, palpitations  Gastrointestinal: Denies abdominal pain, nausea and vomiting Genitourinary: denies burning with urination or urinary frequency Musculoskeletal: Positive joint pains   Skin: denies any other rashes or skin discolorations  Neurological: denies any other headache, dizziness, weakness  Psychiatric: denies any other depression, anxiety   All other review of systems were negative   VITAL SIGNS:  Temp:  [97.7 F (36.5 C)-98.7 F (37.1 C)] 97.9 F (36.6 C) (03/31 1054) Pulse Rate:  [77-99] 90 (03/31 1054) Resp:  [16-20] 16 (03/31 1054) BP: (92-101)/(72-83) 95/78 (03/31 1054) SpO2:  [93 %-100 %] 98 % (03/31 1054) Weight:  [77.6 kg] 77.6 kg (03/31 0500)     Height: 6' (182.9 cm) Weight: 77.6 kg BMI (Calculated): 23.2    INTAKE/OUTPUT:  This shift: Total I/O In: 120 [P.O.:120] Out: -   Last 2 shifts: @IOLAST2SHIFTS @   PHYSICAL EXAM:  Constitutional:  -- Normal body habitus  -- Awake, alert, and oriented x3  Eyes:  -- Pupils equally round and reactive to light  -- No scleral icterus  Ear, nose, and throat:  -- No jugular venous distension  Pulmonary:  -- No crackles  -- Equal breath sounds bilaterally -- Breathing mildly labored at rest Cardiovascular:  -- S1, S2 present  -- No pericardial rubs Gastrointestinal:  -- Abdomen soft, nontender, non-distended, no guarding or rebound tenderness -- No abdominal masses appreciated, pulsatile or otherwise  Musculoskeletal and Integumentary:  -- Wounds: There is a pressure ulcer in the left shoulder 3 cm and 5  cm with surrounding erythema.  There is a large sacral pressure ulcer with no sign of fluid collection, no drainage Neurologic:  -- Motor function: intact and symmetric -- Sensation: intact and symmetric   Labs:  CBC Latest Ref Rng & Units 05/02/2020 05/01/2020 04/30/2020  WBC 4.0 - 10.5 K/uL 9.3 10.6(H) 18.7(H)  Hemoglobin 13.0 - 17.0 g/dL 10.9(L) 11.5(L) 13.2  Hematocrit 39.0 - 52.0 % 31.8(L) 34.3(L) 38.6(L)  Platelets 150 - 400 K/uL 150 115(L) 127(L)   CMP Latest Ref Rng & Units 05/02/2020 05/01/2020 04/30/2020  Glucose 70 - 99 mg/dL 118(H) 108(H) 88  BUN 8 - 23 mg/dL 39(H) 32(H) 27(H)  Creatinine 0.61 - 1.24 mg/dL 1.25(H) 0.89 0.64  Sodium 135 - 145 mmol/L 133(L) 134(L) 138  Potassium 3.5 - 5.1 mmol/L 3.7 3.1(L) 3.4(L)  Chloride 98 - 111 mmol/L 96(L) 94(L) 109  CO2 22 - 32 mmol/L 23 26 23   Calcium 8.9 - 10.3 mg/dL 8.2(L) 7.6(L) 5.6(LL)  Total Protein 6.5 - 8.1 g/dL - 6.0(L) 4.9(L)  Total Bilirubin 0.3 - 1.2 mg/dL - 2.7(H) 3.0(H)  Alkaline Phos 38 - 126 U/L - 107 106  AST 15 - 41 U/L - 24 39  ALT 0 - 44 U/L - 25 25    Imaging studies:  I personally evaluated the images of the abdominal pelvic CT scan.  The base of the lungs with  severe fibrosis and honeycombing diffusely.  There is no other intra-abdominal pathology.    Assessment/Plan:  70 y.o. male with multiple pressure ulcers, complicated by pertinent comorbidities including severe heart failure with decreased ejection fraction, interstitial lung disease.  Patient with multiple pressure ulcers one on the left shoulder and one in the sacrum.  I would not recommend surgical debridement in this patient.  This patient is not a candidate for surgical management since he would not tolerate anesthesia.  There is a severe interstitial lung disease and acute over chronic CHF and patient is currently symptomatic with shortness of breath.  Patient should not get sedation or general anesthesia.  Due to patient pain he would not tolerate a bedside debridement either.  There is no sign of sepsis due to his sacral ulcers.  I recommend to continue conservative management by wound care nurses.  Also has recommended before he should get mobilized as soon as possible.  He will need to be by his elbow he needs help to get out of bed and ambulate with a walker.  This will help to avoid progression of his bedsores.  No surgical management is recommended.  Arnold Long, MD

## 2020-05-02 NOTE — Progress Notes (Signed)
OT Cancellation Note  Patient Details Name: Jason Fitzgerald MRN: [id number] DOB: 02/25/1950   Cancelled Treatment:    Reason Eval/Treat Not Completed: Other (comment)  Pt declines to participate with OT at this time citing feeling hot and "like I'm going to have a heart attack." OT adjusts pt's room temperature and assesses pt and VS appear to be within his normal range over his hospital stay. RN and training RN notified of pt's comment. Pt left with all needs met and in reach and his care team heading to assess him. Will f/u at later date/time as able/pt agreeable for OT treatment. Thank you.  Gerrianne Scale, [place], OTR/L ascom [phone] 05/02/20, 4:18 PM

## 2020-05-02 NOTE — Progress Notes (Signed)
PROGRESS NOTE    Jason Fitzgerald  HBZ:[id number] DOB: September 18, 1950 DOA: 04/29/2020 PCP: Steele Sizer, MD   Chief complaint.  Shortness of breath. Brief Narrative:  Jason Fitzgerald is a 70 y.o. male with medical history significant for hypertension, interstitial lung disease, heart failure reduced ejection fraction, hyperlipidemia, history of atrial fibrillation, presents emergency department by EMS for shortness of breath.  Patient condition has been progressively getting worse over the last week, he has a severe short of breath with minimal exertion, he has been sleeping in a recliner, frequent paroxysmal nocturnal dyspnea.  He also drinks alcohol and he used cocaine. Upon arriving the hospital, he was hypotensive.     Assessment & Plan:   Principal Problem:   Shortness of breath Active Problems:   Arteriosclerosis of coronary artery   HTN (hypertension)   Interstitial lung disease (HCC)   Chronic cough   Degenerative arthritis of lumbar spine   Chronic systolic CHF (congestive heart failure) (HCC)   AF (paroxysmal atrial fibrillation) (HCC)   Rheumatoid arthritis involving multiple sites with positive rheumatoid factor (HCC)   Fibrosis, pulmonary, interstitial, diffuse (HCC)   GERD (gastroesophageal reflux disease)   COPD (chronic obstructive pulmonary disease) (HCC)   CHF (congestive heart failure) (HCC)   Anemia   Atrial fibrillation (HCC)   Non compliance w medication regimen   DNR (do not resuscitate)   Prolonged QT interval   Acute on chronic systolic (congestive) heart failure ([place])   Sepsis ([place])   Aspiration pneumonia of both lower lobes due to gastric secretions ([place])  #1.  Acute on chronic systolic congestive heart failure. --Patient has evidence of volume overload, significant paroxysmal nocturnal dyspnea, leg edema.  He has intermittent hypotension due to severe LV dysfunction.  Ejection fraction less than 20%. --Patient overall condition appears to be  end-stage congestive heart failure.  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 --transition from lasix gtt to oral lasix 20 mg BID, per cardiology --continue Farxiga 44m daily  --resume BB as Lpressor 12.5 mg BID with holding parameter for systolic <<101--Hold Entrestod/t hypotension.   Paroxysmal atrial fibrillation. --currently rate controlled --Due to pulmonary fibrosis, cannot be placed on amiodarone. 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 Severe hypoalbuminemia. Intermittent hypotension. Coagulopathy secondary to liver failure. Patient also has liver function changes with elevated bilirubin.  This is due to right-sided congestive heart failure. --cont midodrine 10 mg TID Patient condition is very serious, very high risk of mortality.  #2.  Severe sepsis. Aspiration pneumonia. Interstitial lung disease. Patient met sepsis criteria at time admission, he had tachycardia, tachypnea, hypotension, leukocytosis.  He also has significant lactic acidosis. I reviewed the patient chest x-ray and CT scan, patient has diffuse interstitial lung disease. Due to alcohol drinking, patient has high risk of aspiration pneumonia.  Patient also has Pseudomonas risk due to interstitial lung disease.   --procal elevated Plan: --cont cefepime for now  4.  Alcohol abuse. Cocaine abuse. Continue alcohol withdrawal protocol.  Continue thiamine and folic acid.  #6.  Severe protein calorie malnutrition. Patient had significant muscle atrophy, looks malnourished.   --cont protein supplement  #7.  Hypokalemia. --monitor and replete PRN  Constipation --Miralax 34g BID until BM, then will reduce to daily  Unstageable ulcers over left shoulder and sacral areas --wound care consulted, recommended surgical  consult --Surgery consult today, does  not recommend debridement.   DVT prophylaxis: lovenox Code Status: Family requested change back to Full code Family Communication:  Disposition Plan:   Status is: inpatient  The patient will require care spanning > 2 midnights and should be moved to inpatient because: Inpatient level of care appropriate due to severity of illness  Dispo: The patient is from: Home              Anticipated d/c is to: Home              Patient currently is not medically stable to d/c.  On IV cefepime, also pending cardiology eval    Difficult to place patient No    I/O last 3 completed shifts: In: 698.2 [P.O.:350; I.V.:46.3; IV Piggyback:301.9] Out: 1700 [Urine:1700] Total I/O In: 120 [P.O.:120] Out: -      Consultants:   Cardiology  Procedures: None  Antimicrobials: Cefepime  Subjective: Surgical consulted today to assess pt's unstageable wounds on his left shoulder and sacral areas.  Pt complained of pain afterwards from just manipulation.  Pt said he wanted to be left alone.  Denied chest pain.  Didn't complain of heart racing today.   Objective: Vitals:   05/02/20 0500 05/02/20 0730 05/02/20 1054 05/02/20 1512  BP:  98/78 95/78 98/80   Pulse:  77 90 96  Resp:  16 16 16   Temp:  97.7 F (36.5 C) 97.9 F (36.6 C) 97.7 F (36.5 C)  TempSrc:  Oral Oral Oral  SpO2:  93% 98% 100%  Weight: 77.6 kg     Height:        Intake/Output Summary (Last 24 hours) at 05/02/2020 1653 Last data filed at 05/02/2020 1345 Gross per 24 hour  Intake 474.22 ml  Output 600 ml  Net -125.78 ml   Filed Weights   04/30/20 1203 05/01/20 0438 05/02/20 0500  Weight: 77.2 kg 77.8 kg 77.6 kg    Examination:  Constitutional: NAD, lethargic, but arousable, oriented HEENT: conjunctivae and lids normal, EOMI CV: No cyanosis.   RESP: normal respiratory effort, on RA Extremities: No effusions, edema in BLE SKIN: warm, dry Neuro: II - XII grossly intact.   Psych:  depressed mood and affect.     Data Reviewed: I have personally reviewed following labs and imaging studies  CBC: Recent Labs  Lab 04/29/20 0934 04/30/20 0449 05/01/20 0528 05/02/20 0715  WBC 17.9* 18.7* 10.6* 9.3  NEUTROABS 15.4*  --  8.6*  --   HGB 15.5 13.2 11.5* 10.9*  HCT 45.5 38.6* 34.3* 31.8*  MCV 79.8* 80.4 81.3 78.7*  PLT 149* 127* 115* 502   Basic Metabolic Panel: Recent Labs  Lab 04/29/20 0934 04/29/20 1420 04/30/20 0449 05/01/20 0528 05/02/20 0715  NA 133*  --  138 134* 133*  K 3.4*  --  3.4* 3.1* 3.7  CL 92*  --  109 94* 96*  CO2 26  --  23 26 23   GLUCOSE 124*  --  88 108* 118*  BUN 27*  --  27* 32* 39*  CREATININE 0.99  --  0.64 0.89 1.25*  CALCIUM 8.4*  --  5.6* 7.6* 8.2*  MG  --  2.3  --  1.7 2.1  PHOS  --  2.6  --  2.5  --    GFR: Estimated Creatinine Clearance: 62.1 mL/min (A) (by C-G formula based on SCr of 1.25 mg/dL (H)). Liver Function Tests: Recent Labs  Lab 04/29/20 0934 04/30/20 0449 05/01/20 0528  AST 51* 39 24  ALT  59* 25 25  ALKPHOS 195* 106 107  BILITOT 5.2* 3.0* 2.7*  PROT 9.1* 4.9* 6.0*  ALBUMIN 2.9* 1.6* 2.8*   Recent Labs  Lab 04/29/20 0934  LIPASE 29   No results for input(s): AMMONIA in the last 168 hours. Coagulation Profile: Recent Labs  Lab 04/30/20 0449  INR 1.6*   Cardiac Enzymes: No results for input(s): CKTOTAL, CKMB, CKMBINDEX, TROPONINI in the last 168 hours. BNP (last 3 results) No results for input(s): PROBNP in the last 8760 hours. HbA1C: No results for input(s): HGBA1C in the last 72 hours. CBG: No results for input(s): GLUCAP in the last 168 hours. Lipid Profile: No results for input(s): CHOL, HDL, LDLCALC, TRIG, CHOLHDL, LDLDIRECT in the last 72 hours. Thyroid Function Tests: Recent Labs    04/30/20 0449  FREET4 1.16*   Anemia Panel: No results for input(s): VITAMINB12, FOLATE, FERRITIN, TIBC, IRON, RETICCTPCT in the last 72 hours. Sepsis Labs: Recent Labs  Lab 04/29/20 0934  04/29/20 1043 04/29/20 1220 04/30/20 0449 04/30/20 1242 05/01/20 0528  PROCALCITON  --  3.66  --  2.87  --  3.97  LATICACIDVEN 2.4*  --  3.2*  --  2.7*  --     Recent Results (from the past 240 hour(s))  Culture, blood (routine x 2)     Status: None (Preliminary result)   Collection Time: 04/29/20  9:34 AM   Specimen: BLOOD  Result Value Ref Range Status   Specimen Description BLOOD RIGHT ARM  Final   Special Requests   Final    BOTTLES DRAWN AEROBIC AND ANAEROBIC Blood Culture adequate volume   Culture   Final    NO GROWTH 3 DAYS Performed at [facility], [address], [place], [place] [address]    Report Status PENDING  Incomplete  Culture, blood (routine x 2)     Status: None (Preliminary result)   Collection Time: 04/29/20  9:34 AM   Specimen: BLOOD  Result Value Ref Range Status   Specimen Description BLOOD BLOOD LEFT ARM  Final   Special Requests   Final    BOTTLES DRAWN AEROBIC AND ANAEROBIC Blood Culture adequate volume   Culture   Final    NO GROWTH 3 DAYS Performed at [facility], [address], [place], [place] [address]    Report Status PENDING  Incomplete  SARS CORONAVIRUS 2 (TAT 6-24 HRS) Nasopharyngeal Nasopharyngeal Swab     Status: None   Collection Time: 04/29/20  9:40 AM   Specimen: Nasopharyngeal Swab  Result Value Ref Range Status   SARS Coronavirus 2 NEGATIVE NEGATIVE Final    Comment: (NOTE) SARS-CoV-2 target nucleic acids are NOT DETECTED.  The SARS-CoV-2 RNA is generally detectable in upper and lower respiratory specimens during the acute phase of infection. Negative results do not preclude SARS-CoV-2 infection, do not rule out co-infections with other pathogens, and should not be used as the sole basis for treatment or other patient management decisions. Negative results must be combined with clinical observations, patient history, and epidemiological information. The expected result is Negative.  Fact Sheet for  Patients: SugarRoll.be  Fact Sheet for Healthcare Providers: https://www.woods-mathews.com/  This test is not yet approved or cleared by the [place] FDA and  has been authorized for detection and/or diagnosis of SARS-CoV-2 by FDA under an Emergency Use Authorization (EUA). This EUA will remain  in effect (meaning this test can be used) for the duration of the COVID-19 declaration under Se ction 564(b)(1) of the Act,  21 U.S.C. section 360bbb-3(b)(1), unless the authorization is terminated or revoked sooner.  Performed at [facility], [place] [address] [place], [place] [address]          Radiology Studies: No results found.      Scheduled Meds: . atorvastatin  40 mg Oral Daily  . calcium carbonate  400 mg of elemental calcium Oral TID WC  . collagenase   Topical Daily  . dapagliflozin propanediol  10 mg Oral Daily  . enoxaparin (LOVENOX) injection  40 mg Subcutaneous Q24H  . feeding supplement  237 mL Oral BID BM  . folic acid  1 mg Oral Daily  . furosemide  20 mg Oral BID  . midodrine  10 mg Oral TID WC  . multivitamin with minerals  1 tablet Oral Daily  . polyethylene glycol  34 g Oral BID  . thiamine  100 mg Oral Daily   Or  . thiamine  100 mg Intravenous Daily   Continuous Infusions: . ceFEPime (MAXIPIME) IV 2 g (05/02/20 1435)     LOS: 2 days     Enzo Bi, MD [facility]   To contact the attending provider between 7A-7P or the covering provider during after hours 7P-7A, please log into the web site [email or web address] and access using universal [place] password for that web site. If you do not have the password, please call the hospital operator.  05/02/2020, 4:53 PM

## 2020-05-02 NOTE — Consult Note (Addendum)
[facility] Nurse Consult Note: Reason for Consult: Consult requested for pressure injures. Pt is not aware of how long the wounds have been present and has not been applying any topical treatment. Wound type: Left posterior shoulder with Unstageable pressure injury; 3.5X3.5cm, 100% tightly adhered eschar, mod amt tan drainage and odor, surrounded by 3 cm of raised generalized erythremia, painful to touch. Sacrum/bilat buttocks with Unstageable pressure injury; 7X14 cm, 100% tightly adhered eschar, small amt tan drainage and odor, painful to touch. Pt is frequently incontinent of urine and it is challenging to keep the location from becoming soiled; condom cath has been applied but is difficult to keep in place. Pressure Injury POA: Yes Dressing procedure/placement/frequency: Recommend surgical consult for possible debridement of Unstageable pressure injuries; secure chat message sent to the primary team to inform them of this suggestion. Air mattress ordered t reduce pressure. Topical treatment orders provided for bedside nurses to perform as follows to assist with enzymatic debridement: Apply Santyl to left posterior shoulder and sacrum wounds Q day, then cover with moist gauze and foam dressings.  (Change foam dressings Q 3 days or PRN soiling.) Please re-consult if further assistance is needed.  Thank-you,  Julien Girt MSN, [place], [place], [place], [place]

## 2020-05-03 DIAGNOSIS — R0602 Shortness of breath: Secondary | ICD-10-CM | POA: Diagnosis not present

## 2020-05-03 LAB — MAGNESIUM: Magnesium: 2.1 mg/dL (ref 1.7–2.4)

## 2020-05-03 LAB — CBC
HCT: 33.9 % — ABNORMAL LOW (ref 39.0–52.0)
Hemoglobin: 11.9 g/dL — ABNORMAL LOW (ref 13.0–17.0)
MCH: 27.4 pg (ref 26.0–34.0)
MCHC: 35.1 g/dL (ref 30.0–36.0)
MCV: 78.1 fL — ABNORMAL LOW (ref 80.0–100.0)
Platelets: 168 10*3/uL (ref 150–400)
RBC: 4.34 MIL/uL (ref 4.22–5.81)
RDW: 15.4 % (ref 11.5–15.5)
WBC: 10 10*3/uL (ref 4.0–10.5)
nRBC: 0 % (ref 0.0–0.2)

## 2020-05-03 LAB — BASIC METABOLIC PANEL
Anion gap: 14 (ref 5–15)
BUN: 44 mg/dL — ABNORMAL HIGH (ref 8–23)
CO2: 22 mmol/L (ref 22–32)
Calcium: 8.4 mg/dL — ABNORMAL LOW (ref 8.9–10.3)
Chloride: 97 mmol/L — ABNORMAL LOW (ref 98–111)
Creatinine, Ser: 1.39 mg/dL — ABNORMAL HIGH (ref 0.61–1.24)
GFR, Estimated: 55 mL/min — ABNORMAL LOW (ref >60)
Glucose, Bld: 102 mg/dL — ABNORMAL HIGH (ref 70–99)
Potassium: 3.6 mmol/L (ref 3.5–5.1)
Sodium: 133 mmol/L — ABNORMAL LOW (ref 135–145)

## 2020-05-03 MED ORDER — ALBUMIN HUMAN 25 % IV SOLN
12.5000 g | Freq: Once | INTRAVENOUS | Status: AC
  Administered 2020-05-03: 12.5 g via INTRAVENOUS
  Filled 2020-05-03: qty 50

## 2020-05-03 MED ORDER — PREDNISONE 20 MG PO TABS
20.0000 mg | ORAL_TABLET | Freq: Every day | ORAL | Status: DC
Start: 2020-05-04 — End: 2020-05-06
  Administered 2020-05-04 – 2020-05-06 (×3): 20 mg via ORAL
  Filled 2020-05-03 (×3): qty 1

## 2020-05-03 MED ORDER — FLUDROCORTISONE ACETATE 0.1 MG PO TABS
0.1000 mg | ORAL_TABLET | Freq: Every day | ORAL | Status: DC
  Administered 2020-05-03 – 2020-05-10 (×7): 0.1 mg via ORAL
  Filled 2020-05-03 (×9): qty 1

## 2020-05-03 MED ORDER — FUROSEMIDE 10 MG/ML IJ SOLN
20.0000 mg | Freq: Every day | INTRAMUSCULAR | Status: DC
  Administered 2020-05-03 – 2020-05-04 (×2): 20 mg via INTRAVENOUS
  Filled 2020-05-03 (×2): qty 2

## 2020-05-03 NOTE — Progress Notes (Signed)
Sister requested MD call to give update.   Let MD know.  Doctor called patient daughter per patient request

## 2020-05-03 NOTE — Care Management Important Message (Signed)
Important Message  Patient Details  Name: Jason Fitzgerald MRN: [id number] Date of Birth: 18-Sep-1950   Medicare Important Message Given:  Yes     [name] 05/03/2020, 1:53 PM

## 2020-05-03 NOTE — Progress Notes (Addendum)
PROGRESS NOTE    Jason Fitzgerald  DJM:[id number] DOB: [birth year] DOA: 04/29/2020 PCP: Steele Sizer, MD   Chief complaint.  Shortness of breath. Brief Narrative:  Jason Fitzgerald is a 70 y.o. male with medical history significant for hypertension, interstitial lung disease, heart failure reduced ejection fraction, hyperlipidemia, history of atrial fibrillation, presents emergency department by EMS for shortness of breath.  Patient condition has been progressively getting worse over the last week, he has a severe short of breath with minimal exertion, he has been sleeping in a recliner, frequent paroxysmal nocturnal dyspnea.  He also drinks alcohol and he used cocaine. Upon arriving the hospital, he was hypotensive.     Assessment & Plan:   Principal Problem:   Shortness of breath Active Problems:   Arteriosclerosis of coronary artery   HTN (hypertension)   Interstitial lung disease (HCC)   Chronic cough   Degenerative arthritis of lumbar spine   Chronic systolic CHF (congestive heart failure) (HCC)   AF (paroxysmal atrial fibrillation) (HCC)   Rheumatoid arthritis involving multiple sites with positive rheumatoid factor (HCC)   Fibrosis, pulmonary, interstitial, diffuse (HCC)   GERD (gastroesophageal reflux disease)   COPD (chronic obstructive pulmonary disease) (HCC)   CHF (congestive heart failure) (HCC)   Anemia   Atrial fibrillation (HCC)   Non compliance w medication regimen   DNR (do not resuscitate)   Prolonged QT interval   Acute on chronic systolic (congestive) heart failure ([place])   Sepsis ([place])   Aspiration pneumonia of both lower lobes due to gastric secretions ([place])  #1.  Acute on chronic systolic congestive heart failure. --Patient has evidence of volume overload, significant paroxysmal nocturnal dyspnea, leg edema.  He has intermittent hypotension due to severe LV dysfunction.  Ejection fraction less than 20%. --Patient overall condition appears to be  end-stage congestive heart failure.  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 --transition from lasix gtt to oral lasix 20 mg BID, per cardiology Plan: --gently diurese with albumin, per pulm --continue Farxiga 8m daily  --d/c Lopressor, per pulm --Hold Entrestod/t hypotension.   Paroxysmal atrial fibrillation. --currently rate controlled --Due to pulmonary fibrosis, cannot be placed on amiodarone. 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 Severe hypoalbuminemia. Intermittent hypotension. Coagulopathy secondary to liver failure. Patient also has liver function changes with elevated bilirubin.  This is due to right-sided congestive heart failure. --cont midodrine 10 mg TID Patient condition is very serious, very high risk of mortality.  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  Severe sepsis. Aspiration pneumonia. Patient met sepsis criteria at time admission, he had tachycardia, tachypnea, hypotension, leukocytosis.  He also has significant lactic acidosis. Due to alcohol drinking, patient has high risk of aspiration pneumonia.  Patient also has Pseudomonas risk due to interstitial lung disease.   --procal elevated --finished 5 days of cefepime  4.  Alcohol abuse. Cocaine abuse. Continue alcohol withdrawal protocol.  Continue thiamine and folic acid.  #6.  Severe protein calorie malnutrition. Patient had significant muscle atrophy, looks malnourished.   --cont protein supplement --PT/OT  #7.  Hypokalemia. --monitor and replete PRN  Constipation --Miralax 34g BID until BM, then will reduce to daily  Unstageable ulcers over left shoulder and sacral areas --wound care consulted, recommended surgical consult --Surgery  consult on 3/31, does not recommend debridement.  DVT prophylaxis: lovenox Code Status: Family requested change back to Full code Family Communication: daughter updated on the phone today Disposition Plan:   Status is: inpatient   Dispo: The patient is from: Home              Anticipated d/c is to: Home              Patient currently is not medically stable to d/c.  Severe dyspnea, pending pulm eval and diuresis    Difficult to place patient No    I/O last 3 completed shifts: In: 22 [P.O.:220; IV Piggyback:450] Out: 400 [Urine:400] Total I/O In: 240 [P.O.:240] Out: -      Consultants:   Cardiology  Procedures: None  Antimicrobials: Cefepime  Subjective: Pt reported feeling better today, still had constant dyspnea.  Working with mobility tech in bed.     Objective: Vitals:   05/03/20 0348 05/03/20 0817 05/03/20 1204 05/03/20 1621  BP: 106/75 109/71 100/66 97/66  Pulse: 91 90 91 75  Resp: 17 18 18 18   Temp: 97.7 F (36.5 C)  97.8 F (36.6 C)   TempSrc: Oral  Oral   SpO2: 98% 98% 99% (!) 85%  Weight: 79 kg     Height:        Intake/Output Summary (Last 24 hours) at 05/03/2020 1649 Last data filed at 05/03/2020 1100 Gross per 24 hour  Intake 490 ml  Output 400 ml  Net 90 ml   Filed Weights   05/01/20 0438 05/02/20 0500 05/03/20 0348  Weight: 77.8 kg 77.6 kg 79 kg    Examination:  Constitutional: NAD, AAOx3, more talkative today HEENT: conjunctivae and lids normal, EOMI CV: No cyanosis.   RESP: normal respiratory effort, on RA Extremities: Mild edema in BLE SKIN: warm, dry Neuro: II - XII grossly intact.   Psych: Normal mood and affect.     Data Reviewed: I have personally reviewed following labs and imaging studies  CBC: Recent Labs  Lab 04/29/20 0934 04/30/20 0449 05/01/20 0528 05/02/20 0715 05/03/20 0809  WBC 17.9* 18.7* 10.6* 9.3 10.0  NEUTROABS 15.4*  --  8.6*  --   --   HGB 15.5 13.2 11.5* 10.9* 11.9*  HCT 45.5 38.6* 34.3*  31.8* 33.9*  MCV 79.8* 80.4 81.3 78.7* 78.1*  PLT 149* 127* 115* 150 161   Basic Metabolic Panel: Recent Labs  Lab 04/29/20 0934 04/29/20 1420 04/30/20 0449 05/01/20 0528 05/02/20 0715 05/03/20 0809  NA 133*  --  138 134* 133* 133*  K 3.4*  --  3.4* 3.1* 3.7 3.6  CL 92*  --  109 94* 96* 97*  CO2 26  --  23 26 23 22   GLUCOSE 124*  --  88 108* 118* 102*  BUN 27*  --  27* 32* 39* 44*  CREATININE 0.99  --  0.64 0.89 1.25* 1.39*  CALCIUM 8.4*  --  5.6* 7.6* 8.2* 8.4*  MG  --  2.3  --  1.7 2.1 2.1  PHOS  --  2.6  --  2.5  --   --    GFR: Estimated Creatinine Clearance: 55.8 mL/min (A) (by C-G formula based on SCr of 1.39 mg/dL (H)). Liver Function Tests: Recent Labs  Lab 04/29/20 0934 04/30/20 0449 05/01/20 0528  AST 51* 39 24  ALT 59* 25 25  ALKPHOS 195* 106 107  BILITOT 5.2* 3.0* 2.7*  PROT 9.1* 4.9* 6.0*  ALBUMIN 2.9* 1.6* 2.8*   Recent Labs  Lab 04/29/20 [phone]  LIPASE 29   No results for input(s): AMMONIA in the last 168 hours. Coagulation Profile: Recent Labs  Lab 04/30/20 0449  INR 1.6*   Cardiac Enzymes: No results for input(s): CKTOTAL, CKMB, CKMBINDEX, TROPONINI in the last 168 hours. BNP (last 3 results) No results for input(s): PROBNP in the last 8760 hours. HbA1C: No results for input(s): HGBA1C in the last 72 hours. CBG: No results for input(s): GLUCAP in the last 168 hours. Lipid Profile: No results for input(s): CHOL, HDL, LDLCALC, TRIG, CHOLHDL, LDLDIRECT in the last 72 hours. Thyroid Function Tests: No results for input(s): TSH, T4TOTAL, FREET4, T3FREE, THYROIDAB in the last 72 hours. Anemia Panel: No results for input(s): VITAMINB12, FOLATE, FERRITIN, TIBC, IRON, RETICCTPCT in the last 72 hours. Sepsis Labs: Recent Labs  Lab 04/29/20 0934 04/29/20 1043 04/29/20 1220 04/30/20 0449 04/30/20 1242 05/01/20 0528  PROCALCITON  --  3.66  --  2.87  --  3.97  LATICACIDVEN 2.4*  --  3.2*  --  2.7*  --     Recent Results (from the past 240  hour(s))  Culture, blood (routine x 2)     Status: None (Preliminary result)   Collection Time: 04/29/20  9:34 AM   Specimen: BLOOD  Result Value Ref Range Status   Specimen Description BLOOD RIGHT ARM  Final   Special Requests   Final    BOTTLES DRAWN AEROBIC AND ANAEROBIC Blood Culture adequate volume   Culture   Final    NO GROWTH 4 DAYS Performed at [facility], [address], [place], [place] [address]    Report Status PENDING  Incomplete  Culture, blood (routine x 2)     Status: None (Preliminary result)   Collection Time: 04/29/20  9:34 AM   Specimen: BLOOD  Result Value Ref Range Status   Specimen Description BLOOD BLOOD LEFT ARM  Final   Special Requests   Final    BOTTLES DRAWN AEROBIC AND ANAEROBIC Blood Culture adequate volume   Culture   Final    NO GROWTH 4 DAYS Performed at [facility], [address], [place], [place] [address]    Report Status PENDING  Incomplete  SARS CORONAVIRUS 2 (TAT 6-24 HRS) Nasopharyngeal Nasopharyngeal Swab     Status: None   Collection Time: 04/29/20  9:40 AM   Specimen: Nasopharyngeal Swab  Result Value Ref Range Status   SARS Coronavirus 2 NEGATIVE NEGATIVE Final    Comment: (NOTE) SARS-CoV-2 target nucleic acids are NOT DETECTED.  The SARS-CoV-2 RNA is generally detectable in upper and lower respiratory specimens during the acute phase of infection. Negative results do not preclude SARS-CoV-2 infection, do not rule out co-infections with other pathogens, and should not be used as the sole basis for treatment or other patient management decisions. Negative results must be combined with clinical observations, patient history, and epidemiological information. The expected result is Negative.  Fact Sheet for Patients: SugarRoll.be  Fact Sheet for Healthcare Providers: https://www.woods-mathews.com/  This test is not yet approved or cleared by the [place] FDA  and  has been authorized for detection and/or diagnosis of SARS-CoV-2 by FDA under an Emergency Use Authorization (EUA). This EUA will remain  in effect (meaning this test can be used) for the duration of the COVID-19 declaration under Se ction 564(b)(1) of the Act, 21 U.S.C. section 360bbb-3(b)(1), unless the authorization is terminated or revoked sooner.  Performed at [facility], [place] [address], [place], [place] [address]  Radiology Studies: No results found.      Scheduled Meds: . atorvastatin  40 mg Oral Daily  . calcium carbonate  400 mg of elemental calcium Oral TID WC  . collagenase   Topical Daily  . dapagliflozin propanediol  10 mg Oral Daily  . enoxaparin (LOVENOX) injection  40 mg Subcutaneous Q24H  . feeding supplement  237 mL Oral BID BM  . folic acid  1 mg Oral Daily  . metoprolol tartrate  12.5 mg Oral BID  . midodrine  10 mg Oral TID WC  . multivitamin with minerals  1 tablet Oral Daily  . polyethylene glycol  34 g Oral BID  . thiamine  100 mg Oral Daily   Or  . thiamine  100 mg Intravenous Daily   Continuous Infusions: . ceFEPime (MAXIPIME) IV 2 g (05/03/20 1621)     LOS: 3 days     Enzo Bi, MD [facility]   To contact the attending provider between 7A-7P or the covering provider during after hours 7P-7A, please log into the web site [email or web address] and access using universal [place] password for that web site. If you do not have the password, please call the hospital operator.  05/03/2020, 4:49 PM

## 2020-05-03 NOTE — Progress Notes (Signed)
Mobility Specialist - Progress Note   05/03/20 1600  Mobility  Range of Motion/Exercises Right leg;Left leg (AP, SLR, ABD, HS, GS)  Level of Assistance Standby assist, set-up cues, supervision of patient - no hands on  Assistive Device None  Distance Ambulated (ft) 0 ft  Mobility Response Tolerated well  Mobility performed by Mobility specialist  $Mobility charge 1 Mobility    Pre-mobility: 77 HR, 98% SpO2 Post-mobility: 78 HR, 96% SpO2   Pt participated in bed-level therex: ankle pumps 2x10, straight leg raises 2x10, abduction 2x10, heel slides x10, gluteal sets x10. Pt very motivated throughout session. C/o constipation. AOX3. Doctor entered [employer].    Kathee Delton Mobility Specialist 05/03/20, 4:25 PM

## 2020-05-03 NOTE — Progress Notes (Signed)
SUBJECTIVE: Patient still very short of breath   Vitals:   05/02/20 1054 05/02/20 1512 05/02/20 1946 05/03/20 0348  BP: 95/78 98/80 104/68 106/75  Pulse: 90 96 72 91  Resp: 16 16 18 17   Temp: 97.9 F (36.6 C) 97.7 F (36.5 C) 97.7 F (36.5 C) 97.7 F (36.5 C)  TempSrc: Oral Oral Oral Oral  SpO2: 98% 100% 100% 98%  Weight:    79 kg  Height:        Intake/Output Summary (Last 24 hours) at 05/03/2020 0815 Last data filed at 05/03/2020 0353 Gross per 24 hour  Intake 370 ml  Output 400 ml  Net -30 ml    LABS: Basic Metabolic Panel: Recent Labs    05/01/20 0528 05/02/20 0715  NA 134* 133*  K 3.1* 3.7  CL 94* 96*  CO2 26 23  GLUCOSE 108* 118*  BUN 32* 39*  CREATININE 0.89 1.25*  CALCIUM 7.6* 8.2*  MG 1.7 2.1  PHOS 2.5  --    Liver Function Tests: Recent Labs    05/01/20 0528  AST 24  ALT 25  ALKPHOS 107  BILITOT 2.7*  PROT 6.0*  ALBUMIN 2.8*   No results for input(s): LIPASE, AMYLASE in the last 72 hours. CBC: Recent Labs    05/01/20 0528 05/02/20 0715  WBC 10.6* 9.3  NEUTROABS 8.6*  --   HGB 11.5* 10.9*  HCT 34.3* 31.8*  MCV 81.3 78.7*  PLT 115* 150   Cardiac Enzymes: No results for input(s): CKTOTAL, CKMB, CKMBINDEX, TROPONINI in the last 72 hours. BNP: Invalid input(s): POCBNP D-Dimer: No results for input(s): DDIMER in the last 72 hours. Hemoglobin A1C: No results for input(s): HGBA1C in the last 72 hours. Fasting Lipid Panel: No results for input(s): CHOL, HDL, LDLCALC, TRIG, CHOLHDL, LDLDIRECT in the last 72 hours. Thyroid Function Tests: No results for input(s): TSH, T4TOTAL, T3FREE, THYROIDAB in the last 72 hours.  Invalid input(s): FREET3 Anemia Panel: No results for input(s): VITAMINB12, FOLATE, FERRITIN, TIBC, IRON, RETICCTPCT in the last 72 hours.   PHYSICAL EXAM General: Well developed, well nourished, in no acute distress HEENT:  Normocephalic and atramatic Neck:  No JVD.  Lungs: Clear bilaterally to auscultation and  percussion. Heart: HRRR . Normal S1 and S2 without gallops or murmurs.  Abdomen: Bowel sounds are positive, abdomen soft and non-tender  Msk:  Back normal, normal gait. Normal strength and tone for age. Extremities: No clubbing, cyanosis or edema.   Neuro: Alert and oriented X 3. Psych:  Good affect, responds appropriately  TELEMETRY: Sinus rhythm  ASSESSMENT AND PLAN: Congestive heart failure with paroxysmal atrial fibrillation currently in sinus rhythm.  Continue current treatment.  Principal Problem:   Shortness of breath Active Problems:   Arteriosclerosis of coronary artery   HTN (hypertension)   Interstitial lung disease (HCC)   Chronic cough   Degenerative arthritis of lumbar spine   Chronic systolic CHF (congestive heart failure) (HCC)   AF (paroxysmal atrial fibrillation) (HCC)   Rheumatoid arthritis involving multiple sites with positive rheumatoid factor (HCC)   Fibrosis, pulmonary, interstitial, diffuse (HCC)   GERD (gastroesophageal reflux disease)   COPD (chronic obstructive pulmonary disease) (HCC)   CHF (congestive heart failure) (HCC)   Anemia   Atrial fibrillation (HCC)   Non compliance w medication regimen   DNR (do not resuscitate)   Prolonged QT interval   Acute on chronic systolic (congestive) heart failure ([place])   Sepsis ([place])   Aspiration pneumonia of both lower lobes due  to gastric secretions (HCC)    Dionisio David, MD, [facility] 05/03/2020 8:15 AM

## 2020-05-03 NOTE — Consult Note (Signed)
Pulmonary Medicine          Date: 05/03/2020,   MRN# [id number] Jason Fitzgerald April 26, 1950     AdmissionWeight: 74.8 kg                 CurrentWeight: 79 kg   Referring physician: Dr Billie Ruddy   CHIEF COMPLAINT:   Acute on chronic hypoxemic respiratory failure in context of pulmonary fibrosis.    HISTORY OF PRESENT ILLNESS   70 yo M w PMH of CHF , COPD, substance abuse , Pulmonary fibrosis, emphysema, chronic hypoxemic respiratory failure, chronic cough who has had recurrent hospitalization for respiratory failure. He has previously been referred to hospice due to comorbid health status however has declined comfort care measures and wishes to continue therapy. He had Transthoracic echo 04/18/20 and it showed severe CHF with EF <62%, RV systolic fucntion is also reduced, there is severe MR. He had CT chest done 12/2019 with findings of basal predominant bilateral pulmonary fibrosis with associated traction bronchiectasis and concomitant upper lobe predominant centriobular emphysema with mild to moderate pleural effusions. Pictorial documentation is included below. Pulmonary consultation placed per patients wishes to have more respiratory workup to help his chronic lung disease.    PAST MEDICAL HISTORY   Past Medical History:  Diagnosis Date  . CHF (congestive heart failure) ([place])   . Chronic cough   . COPD (chronic obstructive pulmonary disease) ([place])   . Elevated liver function tests   . Emphysema of lung ([place])   . Fibrosis, pulmonary, interstitial, diffuse ([place])   . GERD (gastroesophageal reflux disease)   . History of cocaine abuse ([place])   . Mediastinal lymphadenopathy   . Pulmonary fibrosis ([place])   . Right inguinal hernia      SURGICAL HISTORY   Past Surgical History:  Procedure Laterality Date  . COLONOSCOPY WITH PROPOFOL N/A 08/19/2018   Procedure: COLONOSCOPY WITH PROPOFOL;  Surgeon: Virgel Manifold, MD;  Location: [facility] ENDOSCOPY;  Service: Endoscopy;   Laterality: N/A;  . HERNIA REPAIR Right 1973   open  . INGUINAL HERNIA REPAIR Right 11/08/2014   Procedure: LAPAROSCOPIC RIGHT INGUINAL HERNIA REPAIR;  Surgeon: Hubbard Robinson, MD;  Location: [facility] ORS;  Service: General;  Laterality: Right;  . knee arthroscopy Right 1972  . RIGHT/LEFT HEART CATH AND CORONARY ANGIOGRAPHY Right 03/05/2017   Procedure: RIGHT/LEFT HEART CATH AND CORONARY ANGIOGRAPHY;  Surgeon: Dionisio David, MD;  Location: [place] [facility];  Service: Cardiovascular;  Laterality: Right;     FAMILY HISTORY   Family History  Problem Relation Age of Onset  . Diabetes Mother   . Cancer Father   . AAA (abdominal aortic aneurysm) Brother      SOCIAL HISTORY   Social History   Tobacco Use  . Smoking status: Never Smoker  . Smokeless tobacco: Never Used  Vaping Use  . Vaping Use: Never used  Substance Use Topics  . Alcohol use: Not Currently    Alcohol/week: 0.0 standard drinks    Comment: 2 quarts a week, he quit again 12/2018  . Drug use: Not Currently    Types: Cocaine    Comment: as an young adult      MEDICATIONS    Home Medication:    Current Medication:  Current Facility-Administered Medications:  .  acetaminophen (TYLENOL) tablet 1,000 mg, 1,000 mg, Oral, TID PRN, Enzo Bi, MD .  atorvastatin (LIPITOR) tablet 40 mg, 40 mg, Oral, Daily, Cox, Amy N, DO, 40 mg at 05/03/20  1112 .  calcium carbonate (TUMS - dosed in mg elemental calcium) chewable tablet 400 mg of elemental calcium, 400 mg of elemental calcium, Oral, TID WC, Sharen Hones, MD, 400 mg of elemental calcium at 05/03/20 1111 .  ceFEPIme (MAXIPIME) 2 g in sodium chloride 0.9 % 100 mL IVPB, 2 g, Intravenous, Q8H, Enzo Bi, MD, Last Rate: 200 mL/hr at 05/03/20 0530, 2 g at 05/03/20 0530 .  collagenase (SANTYL) ointment, , Topical, Daily, Enzo Bi, MD, Given at 05/03/20 1117 .  dapagliflozin propanediol (FARXIGA) tablet 10 mg, 10 mg, Oral, Daily, Cox, Amy N, DO, 10 mg at 05/03/20 1113 .   enoxaparin (LOVENOX) injection 40 mg, 40 mg, Subcutaneous, Q24H, Enzo Bi, MD, 40 mg at 05/03/20 1114 .  feeding supplement (ENSURE ENLIVE / ENSURE PLUS) liquid 237 mL, 237 mL, Oral, BID BM, Sharen Hones, MD, 237 mL at 05/01/20 1754 .  folic acid (FOLVITE) tablet 1 mg, 1 mg, Oral, Daily, Cox, Amy N, DO, 1 mg at 05/03/20 1112 .  metoprolol tartrate (LOPRESSOR) tablet 12.5 mg, 12.5 mg, Oral, BID, Enzo Bi, MD, 12.5 mg at 05/03/20 1115 .  midodrine (PROAMATINE) tablet 10 mg, 10 mg, Oral, TID WC, Cox, Amy N, DO, 10 mg at 05/03/20 1112 .  multivitamin with minerals tablet 1 tablet, 1 tablet, Oral, Daily, Cox, Amy N, DO, 1 tablet at 05/03/20 1113 .  ondansetron (ZOFRAN) tablet 4 mg, 4 mg, Oral, Q6H PRN **OR** ondansetron (ZOFRAN) injection 4 mg, 4 mg, Intravenous, Q6H PRN, Cox, Amy N, DO, 4 mg at 05/01/20 1056 .  polyethylene glycol (MIRALAX / GLYCOLAX) packet 34 g, 34 g, Oral, BID, Enzo Bi, MD, 34 g at 05/03/20 1113 .  thiamine tablet 100 mg, 100 mg, Oral, Daily, 100 mg at 05/03/20 1112 **OR** thiamine (B-1) injection 100 mg, 100 mg, Intravenous, Daily, Cox, Amy N, DO, 100 mg at 05/02/20 7628    ALLERGIES   Patient has no known allergies.     REVIEW OF SYSTEMS    Review of Systems:  Gen:  Denies  fever, sweats, chills weigh loss  HEENT: Denies blurred vision, double vision, ear pain, eye pain, hearing loss, nose bleeds, sore throat Cardiac:  No dizziness, chest pain or heaviness, chest tightness,edema Resp:   Denies cough or sputum porduction, shortness of breath,wheezing, hemoptysis,  Gi: Denies swallowing difficulty, stomach pain, nausea or vomiting, diarrhea, constipation, bowel incontinence Gu:  Denies bladder incontinence, burning urine Ext:   Denies Joint pain, stiffness or swelling Skin: Denies  skin rash, easy bruising or bleeding or hives Endoc:  Denies polyuria, polydipsia , polyphagia or weight change Psych:   Denies depression, insomnia or hallucinations   Other:  All  other systems negative   VS: BP 100/66 (BP Location: Left Arm)   Pulse 91   Temp 97.8 F (36.6 C) (Oral)   Resp 18   Ht 6' (1.829 m)   Wt 79 kg   SpO2 99%   BMI 23.63 kg/m      PHYSICAL EXAM    GENERAL:NAD, no fevers, chills, no weakness no fatigue HEAD: Normocephalic, atraumatic.  EYES: Pupils equal, round, reactive to light. Extraocular muscles intact. No scleral icterus.  MOUTH: Moist mucosal membrane. Dentition intact. No abscess noted.  EAR, NOSE, THROAT: Clear without exudates. No external lesions.  NECK: Supple. No thyromegaly. No nodules. No JVD.  PULMONARY: Diffuse coarse rhonchi right sided +wheezes CARDIOVASCULAR: S1 and S2. Regular rate and rhythm. No murmurs, rubs, or gallops. No edema. Pedal pulses 2+ bilaterally.  GASTROINTESTINAL: Soft, nontender, nondistended. No masses. Positive bowel sounds. No hepatosplenomegaly.  MUSCULOSKELETAL: No swelling, clubbing, or edema. Range of motion full in all extremities.  NEUROLOGIC: Cranial nerves II through XII are intact. No gross focal neurological deficits. Sensation intact. Reflexes intact.  SKIN: No ulceration, lesions, rashes, or cyanosis. Skin warm and dry. Turgor intact.  PSYCHIATRIC: Mood, affect within normal limits. The patient is awake, alert and oriented x 3. Insight, judgment intact.       IMAGING    CT Abdomen Pelvis W Contrast  Result Date: 04/29/2020 CLINICAL DATA:  Acute nonlocalized abdominal pain EXAM: CT ABDOMEN AND PELVIS WITH CONTRAST TECHNIQUE: Multidetector CT imaging of the abdomen and pelvis was performed using the standard protocol following bolus administration of intravenous contrast. CONTRAST:  144mL OMNIPAQUE IOHEXOL 300 MG/ML  SOLN COMPARISON:  07/20/2013 FINDINGS: Lower chest: Small bilateral pleural effusion. Fibrosis with honeycombing is seen diffusely at the lung bases. Heart size is within normal limits. Gynecomastia. Hepatobiliary: Heterogeneous density of the liver with nutmeg  appearance. Subcapsular right hepatic cyst. No solid masslike finding.Calcification at the gallbladder fossa which appears extraluminal and is chronic. No evidence of biliary inflammation or obstruction. Pancreas: Unremarkable. Spleen: Unremarkable. Adrenals/Urinary Tract: Negative adrenals. No hydronephrosis or stone. Small renal cystic densities. Unremarkable bladder. Stomach/Bowel:  No obstruction. No visible inflammation of bowel. Vascular/Lymphatic: No acute vascular abnormality. Scattered atheromatous calcification of the aorta. No mass or adenopathy. Reproductive:Partially covered right hydrocele Other: No ascites or pneumoperitoneum.  Hernia repair using mesh. Musculoskeletal: No acute abnormalities.  Lumbar spine degeneration. IMPRESSION: 1. No acute intra-abdominal finding. 2. Heterogeneous liver perfusion which could be from fibrosis or right heart failure. 3. Small pleural effusions. 4.  Aortic Atherosclerosis (ICD10-I70.0). Electronically Signed   By: Monte Fantasia M.D.   On: 04/29/2020 11:31   US RENAL  Result Date: 04/20/2020 CLINICAL DATA:  AK I EXAM: RENAL / URINARY TRACT ULTRASOUND COMPLETE COMPARISON:  None. FINDINGS: Right Kidney: Renal measurements: 11.3 x 5.3 x 6.0 cm = volume: 187 mL. Echogenicity within normal limits. No mass or hydronephrosis visualized. Left Kidney: Renal measurements: 11.7 x 5.5 x 5.8 cm = volume: 194 mL. Echogenicity within normal limits. No mass or hydronephrosis visualized. Bladder: Decompressed with Foley catheter in place. Other: None. IMPRESSION: Unremarkable sonographic appearance of the bilateral kidneys. Electronically Signed   By: Audie Pinto M.D.   On: 04/20/2020 11:49   DG Chest Port 1 View  Result Date: 04/29/2020 CLINICAL DATA:  Shortness of breath and pain. EXAM: PORTABLE CHEST 1 VIEW COMPARISON:  04/18/2020 FINDINGS: Stable cardiomediastinal contours. Extensive fibrotic lung disease is identified within both lungs. This is most advanced  within the mid and lower lung zones. No definite superimposed pulmonary opacity. There is a suggestion of subcutaneous gas within the soft tissues in the left supraclavicular region. IMPRESSION: 1. Extensive fibrotic lung disease. 2. No superimposed acute cardiopulmonary abnormality. Electronically Signed   By: Kerby Moors M.D.   On: 04/29/2020 10:43   DG Chest Port 1 View  Result Date: 04/18/2020 CLINICAL DATA:  Chest pain EXAM: PORTABLE CHEST 1 VIEW COMPARISON:  December 08, 2019 chest radiograph; chest CT December 09, 2019 FINDINGS: There is extensive fibrosis throughout the lungs, most severely in the mid and lower lung regions. There are small pleural effusions bilaterally. The heart size is within normal limits. Pulmonary vascularity is within normal limits. Lymph node prominence noted on prior CT is not well delineated radiography. No bone lesions. IMPRESSION: Extensive underlying fibrosis with small pleural effusions bilaterally.  No appreciable new opacity compared to prior studies. Stable cardiac silhouette. Electronically Signed   By: Lowella Grip III M.D.   On: 04/18/2020 09:04   ECHOCARDIOGRAM COMPLETE  Result Date: 04/19/2020    ECHOCARDIOGRAM REPORT   Patient Name:   Jason Fitzgerald Date of Exam: 04/18/2020 Medical Rec #:  [id number]       Height:       69.0 in Accession #:    [id number]      Weight:       171.0 lb Date of Birth:  1950-02-16      BSA:          1.933 m Patient Age:    47 years        BP:           100/68 mmHg Patient Gender: M               HR:           94 bpm. Exam Location:  [facility] Procedure: 2D Echo, Cardiac Doppler and Color Doppler Indications:     CHF-acute systolic I94.85  History:         Patient has prior history of Echocardiogram examinations, most                  recent 12/09/2019. CHF; COPD.  Sonographer:     Sherrie Sport RDCS (AE) Referring Phys:  [id number] [place] Diagnosing Phys: Neoma Laming MD  Sonographer Comments: Suboptimal parasternal window.  IMPRESSIONS  1. Left ventricular ejection fraction, by estimation, is <20%. The left ventricle has severely decreased function. The left ventricle demonstrates global hypokinesis. The left ventricular internal cavity size was severely dilated. There is mild concentric left ventricular hypertrophy. Left ventricular diastolic parameters are consistent with Grade II diastolic dysfunction (pseudonormalization).  2. Right ventricular systolic function is mildly reduced. The right ventricular size is moderately enlarged.  3. Left atrial size was severely dilated.  4. Right atrial size was severely dilated.  5. 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 No evidence of mitral stenosis.  6. The aortic valve is grossly normal. Aortic valve regurgitation is not visualized. Mild aortic valve sclerosis is present, with no evidence of aortic valve stenosis. FINDINGS  Left Ventricle: Left ventricular ejection fraction, by estimation, is <20%. The left ventricle has severely decreased function. The left ventricle demonstrates global hypokinesis. The left ventricular internal cavity size was severely dilated. There is mild concentric left ventricular hypertrophy. Left ventricular diastolic parameters are consistent with Grade II diastolic dysfunction (pseudonormalization). Right Ventricle: The right ventricular size is moderately enlarged. No increase in right ventricular wall thickness. Right ventricular systolic function is mildly reduced. Left Atrium: Left atrial size was severely dilated. Right Atrium: Right atrial size was severely dilated. Pericardium: There is no evidence of pericardial effusion. Mitral Valve: 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 No evidence of mitral valve stenosis. Tricuspid Valve: The tricuspid valve is grossly normal. Tricuspid valve regurgitation is mild. Aortic Valve: The aortic valve is grossly normal. Aortic valve regurgitation is not visualized.  Mild aortic valve sclerosis is present, with no evidence of aortic valve stenosis. Aortic valve mean gradient measures 1.0 mmHg. Aortic valve peak gradient measures 1.7 mmHg. Aortic valve area, by VTI measures 1.85 cm. Pulmonic Valve: The pulmonic valve was normal in structure. Pulmonic valve regurgitation is trivial. Aorta: The aortic root, ascending aorta and aortic arch are all structurally normal, with no evidence of dilitation or obstruction.  IAS/Shunts: The interatrial septum was not assessed.  LEFT VENTRICLE PLAX 2D LVIDd:         5.57 cm LVIDs:         5.01 cm LV PW:         0.90 cm LV IVS:        0.78 cm LVOT diam:     2.10 cm LV SV:         13 LV SV Index:   7 LVOT Area:     3.46 cm  LV Volumes (MOD) LV vol d, MOD A2C: 115.0 ml LV vol d, MOD A4C: 194.0 ml LV vol s, MOD A2C: 91.0 ml LV vol s, MOD A4C: 154.0 ml LV SV MOD A2C:     24.0 ml LV SV MOD A4C:     194.0 ml LV SV MOD BP:      27.8 ml RIGHT VENTRICLE RV S prime:     9.79 cm/s TAPSE (M-mode): 3.7 cm LEFT ATRIUM             Index       RIGHT ATRIUM           Index LA diam:        4.30 cm 2.22 cm/m  RA Area:     16.10 cm LA Vol (A2C):   84.0 ml 43.46 ml/m RA Volume:   43.50 ml  22.50 ml/m LA Vol (A4C):   56.0 ml 28.97 ml/m LA Biplane Vol: 69.5 ml 35.96 ml/m  AORTIC VALVE                   PULMONIC VALVE AV Area (Vmax):    1.41 cm    PV Vmax:        0.18 m/s AV Area (Vmean):   1.72 cm    PV Peak grad:   0.1 mmHg AV Area (VTI):     1.85 cm    RVOT Peak grad: 1 mmHg AV Vmax:           66.00 cm/s AV Vmean:          42.200 cm/s AV VTI:            0.072 m AV Peak Grad:      1.7 mmHg AV Mean Grad:      1.0 mmHg LVOT Vmax:         26.80 cm/s LVOT Vmean:        21.000 cm/s LVOT VTI:          0.039 m LVOT/AV VTI ratio: 0.54  AORTA Ao Root diam: 2.83 cm MITRAL VALVE                TRICUSPID VALVE MV Area (PHT): 5.16 cm     TR Peak grad:   32.7 mmHg MV Decel Time: 147 msec     TR Vmax:        286.00 cm/s MV E velocity: 118.00 cm/s                              SHUNTS                             Systemic VTI:  0.04 m                             Systemic Diam: 2.10  cm Neoma Laming MD Electronically signed by Neoma Laming MD Signature Date/Time: 04/19/2020/8:09:07 AM    Final       ASSESSMENT/PLAN    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  Will dc cefepime. Will start zithromax only 250 PO.     - prednisone 20 mg po daily and florinef .1 tid      Acute on chronic systolic CHF   - he has 1+ LE edema but he has effusions bilaterally, will try to gently diurese with albumin    Severe protein calorie malnutrition  bitemporal wasting and peripheral muscle wasting.  -Nutrition and PT/OT   Thank you for allowing me to participate in the care of this patient.   Patient/Family are satisfied with care plan and all questions have been answered.  This document was prepared using Dragon voice recognition software and may include unintentional dictation errors.     Ottie Glazier, M.D.  Division of [facility]

## 2020-05-04 DIAGNOSIS — R0602 Shortness of breath: Secondary | ICD-10-CM | POA: Diagnosis not present

## 2020-05-04 LAB — CULTURE, BLOOD (ROUTINE X 2)
Culture: NO GROWTH
Culture: NO GROWTH
Special Requests: ADEQUATE
Special Requests: ADEQUATE

## 2020-05-04 LAB — BASIC METABOLIC PANEL
Anion gap: 13 (ref 5–15)
BUN: 50 mg/dL — ABNORMAL HIGH (ref 8–23)
CO2: 24 mmol/L (ref 22–32)
Calcium: 8.4 mg/dL — ABNORMAL LOW (ref 8.9–10.3)
Chloride: 96 mmol/L — ABNORMAL LOW (ref 98–111)
Creatinine, Ser: 1.71 mg/dL — ABNORMAL HIGH (ref 0.61–1.24)
GFR, Estimated: 43 mL/min — ABNORMAL LOW (ref >60)
Glucose, Bld: 113 mg/dL — ABNORMAL HIGH (ref 70–99)
Potassium: 3.7 mmol/L (ref 3.5–5.1)
Sodium: 133 mmol/L — ABNORMAL LOW (ref 135–145)

## 2020-05-04 LAB — MAGNESIUM: Magnesium: 2.4 mg/dL (ref 1.7–2.4)

## 2020-05-04 LAB — CBC
HCT: 33 % — ABNORMAL LOW (ref 39.0–52.0)
Hemoglobin: 11.3 g/dL — ABNORMAL LOW (ref 13.0–17.0)
MCH: 27 pg (ref 26.0–34.0)
MCHC: 34.2 g/dL (ref 30.0–36.0)
MCV: 78.8 fL — ABNORMAL LOW (ref 80.0–100.0)
Platelets: 172 10*3/uL (ref 150–400)
RBC: 4.19 MIL/uL — ABNORMAL LOW (ref 4.22–5.81)
RDW: 15.2 % (ref 11.5–15.5)
WBC: 10.5 10*3/uL (ref 4.0–10.5)
nRBC: 0.2 % (ref 0.0–0.2)

## 2020-05-04 MED ORDER — POLYETHYLENE GLYCOL 3350 17 G PO PACK
17.0000 g | PACK | Freq: Every day | ORAL | Status: DC
  Administered 2020-05-05 – 2020-05-13 (×7): 17 g via ORAL
  Filled 2020-05-04 (×9): qty 1

## 2020-05-04 NOTE — Plan of Care (Signed)
  Problem: Clinical Measurements: Goal: Will remain free from infection Outcome: Progressing   Problem: Clinical Measurements: Goal: Respiratory complications will improve Outcome: Progressing   Problem: Pain Managment: Goal: General experience of comfort will improve Outcome: Progressing   Problem: Safety: Goal: Ability to remain free from injury will improve Outcome: Progressing   

## 2020-05-04 NOTE — Progress Notes (Signed)
Occupational Therapy Treatment Patient Details Name: Jason Fitzgerald MRN: [id number] DOB: [birth year] Today's Date: 05/04/2020    History of present illness 70 y.o. male with medical history significant for hypertension, interstitial lung disease, heart failure reduced ejection fraction, hyperlipidemia, history of atrial fibrillation, presents emergency department by EMS for shortness of breath.   OT comments  Pt seen for OT treatment this date to f/u re: safety with ADLs/ADL mobility. Pt required increased time and encouragement to participate, but is motivated to get OOB to watch basketball from the recliner so he can cheer for [name]. Pt requires MAX A to come to EOB sitting and initially has poor balance (appears mostly r/t pt attempting to lean off his bottom 2/2 pain). Pt does eventually come to F static sitting balance and OT engages him in UB ADLs with MIN A including oral care and UB bathing/dressing with multimodal cues to initiate task and sequence. Pt requires MAX A (with CNA assisting SBA d/t pt having several liquid BMs) to perform sit to stand with RW. Pt only tolerates ~10 seconds on first trial, ~30 seconds on second trial and requires MAX to TOTAL A for LB bathing/peri care while in standing on second trial. Pt requires MAX A and manual assist to maneuver walker to pivot and then take ~3-4 small shuffling steps to recliner. Pt left in recliner with chair alarm. Increased time required in all aspects of session 2/2 pt pain, cognitive status, and for gentle re-direction. Pt will require continued skilled OT services and continue to anticipate that he will require f/u rehabilitation services in SNF setting upon d/c to restore strength and tolerance.     SNF;Supervision/Assistance - 24 hour    Equipment Recommendations  Other (comment) (defer)    Recommendations for Other Services      Precautions / Restrictions Precautions Precautions: Fall Restrictions Weight Bearing Restrictions:  No       Mobility Bed Mobility Overal bed mobility: Needs Assistance Bed Mobility: Supine to Sit     Supine to sit: Max assist     General bed mobility comments: HOB elevated, use of rails, directions for hand placement, increased time    Transfers Overall transfer level: Needs assistance Equipment used: Rolling walker (2 wheeled) Transfers: Sit to/from [name] Sit to Stand: Max assist Stand pivot transfers: Max assist (second person (CNA) stand by to assist with preventing fall as pt with liquid BM dripping almost continuously)       General transfer comment: increased time and cues for foot placement, OT manually maneuvers RW to assist pt with pivoting to chair. Manual/tactile cue for hip extension to come to full stand as pt demos flexed hips initially 2/2 pain and weakness.    Balance Overall balance assessment: Needs assistance Sitting-balance support: Feet supported;Bilateral upper extremity supported Sitting balance-[name]: Fair Sitting balance - Comments: requires UE support   Standing balance support: Bilateral upper extremity supported Standing balance-[name]: Poor Standing balance comment: requires UE support and MAX A for standing balance and balance with transfers/shuffling steps d/t limited ability to weight shift.                           ADL either performed or assessed with clinical judgement   ADL Overall ADL's : Needs assistance/impaired     Grooming: Wash/dry face;Oral care;Minimal assistance;Sitting Grooming Details (indicate cue type and reason): supported sitting, cues for sequencing including tactile initiation cues. Upper Body Bathing: Moderate assistance;Sitting  Upper Body Bathing Details (indicate cue type and reason): cues to initiate and sequence Lower Body Bathing: Maximal assistance;Total assistance;+2 for physical assistance;Sit to/from stand Lower Body Bathing Details (indicate cue type and  reason): OT assists pt with MAX A stand with RW and CNA performs TOTAL A for posterior LB bathing. OT engages pt in seated anterior LB bathing with verbal cues and attempts to have pt participate in posterior while standing, but he is unable to alternate hands from RW as he requires B UE support at all times while standing. Upper Body Dressing : Minimal assistance;Sitting   Lower Body Dressing: Maximal assistance;Total assistance;Sitting/lateral leans Lower Body Dressing Details (indicate cue type and reason): to don socks, OT attempts to engage pt, but he is too weak to meaningfully contribute to donning socks.     Toileting- Clothing Manipulation and Hygiene: Maximal assistance;+2 for physical assistance;Sit to/from stand Toileting - Clothing Manipulation Details (indicate cue type and reason): RW from EOB. Pt with moderately steady loose BMs throughout session 2/2 medication given as pt was constipated in days prior per nursing.     Functional mobility during ADLs: Moderate assistance;Maximal assistance;Rolling walker (OT maneuvers walker and provides moderate multimodal cues to engage pt in 3-4 small shuffling side steps from EOB to chair.)       Vision Baseline Vision/History: Wears glasses Patient Visual Report: No change from baseline     Perception     Praxis      Cognition Arousal/Alertness: Awake/alert Behavior During Therapy: WFL for tasks assessed/performed Overall Cognitive Status: No family/caregiver present to determine baseline cognitive functioning                                 General Comments: pt with more appropriate communication/conversation throughout session with OT, but is primarily perseverant and attentive to pain requiring relaxation cues and gentle re-direction. Pt is able to state that he is [name] and when given 3 options, he is able to choose "hospital". He is also oriented to self and some aspects of situation stating " oooooo, I  really partied too hard this time. My ass is too old to be doing all that". He is easily distractable and does talk in circles or word salad occasionally.        Exercises Other Exercises Other Exercises: OT facilitates ed re: imporatnce of OOB activity. Pt requires increased time and encouragement to direct fo revery aspect of mobilization that took place this session. Pt up to chair with chair alarm with RN, CNA and nursing student aware of session contents.   Shoulder Instructions       General Comments      Pertinent Vitals/ Pain       Pain Assessment: Faces Faces Pain Scale: Hurts even more Pain Location: back, abdomen Pain Descriptors / Indicators: Grimacing;Guarding;Tender;Other (Comment);Moaning Pain Intervention(s): Limited activity within patient's tolerance;Monitored during session;Repositioned  Home Living                                          Prior Functioning/Environment              Frequency  Min 2X/week        Progress Toward Goals  OT Goals(current goals can now be found in the care plan section)  Progress towards OT goals: Progressing toward goals  Acute  Rehab OT Goals Patient Stated Goal: decrease pain, return home OT Goal Formulation: With patient Time For Goal Achievement: 05/14/20 Potential to Achieve Goals: Pray Discharge plan remains appropriate;Frequency remains appropriate    Co-evaluation                 AM-PAC OT "6 Clicks" Daily Activity     Outcome Measure   Help from another person eating meals?: A Little Help from another person taking care of personal grooming?: A Little Help from another person toileting, which includes using toliet, bedpan, or urinal?: Total Help from another person bathing (including washing, rinsing, drying)?: A Lot Help from another person to put on and taking off regular upper body clothing?: A Little Help from another person to put on and taking off regular lower body  clothing?: Total 6 Click Score: 13    End of Session Equipment Utilized During Treatment: Gait belt;Rolling walker  OT Visit Diagnosis: Muscle weakness (generalized) (M62.81);Unsteadiness on feet (R26.81);Pain Pain - part of body:  (back and buttocks)   Activity Tolerance Patient limited by pain   Patient Left in chair;with call bell/phone within reach;with chair alarm set   Nurse Communication Mobility status;Other (comment) (sore bottom)        Time: 1610-9604 OT Time Calculation (min): 75 min  Charges: OT General Charges $OT Visit: 1 Visit OT Treatments $Self Care/Home Management : 38-52 mins $Therapeutic Activity: 23-37 mins  Gerrianne Scale, MS, OTR/L ascom [phone] 05/04/20, 7:17 PM

## 2020-05-04 NOTE — Progress Notes (Signed)
PROGRESS NOTE    Jason Fitzgerald  [name]:[id number] DOB: Dec 12, 1950 DOA: 04/29/2020 PCP: Steele Sizer, MD   Chief complaint.  Shortness of breath. Brief Narrative:  Jason Fitzgerald is a 70 y.o. male with medical history significant for hypertension, interstitial lung disease, heart failure reduced ejection fraction, hyperlipidemia, history of atrial fibrillation, presents emergency department by EMS for shortness of breath.  Patient condition has been progressively getting worse over the last week, he has a severe short of breath with minimal exertion, he has been sleeping in a recliner, frequent paroxysmal nocturnal dyspnea.  He also drinks alcohol and he used cocaine. Upon arriving the hospital, he was hypotensive.     Assessment & Plan:   Principal Problem:   Shortness of breath Active Problems:   Arteriosclerosis of coronary artery   HTN (hypertension)   Interstitial lung disease (HCC)   Chronic cough   Degenerative arthritis of lumbar spine   Chronic systolic CHF (congestive heart failure) (HCC)   AF (paroxysmal atrial fibrillation) (HCC)   Rheumatoid arthritis involving multiple sites with positive rheumatoid factor (HCC)   Fibrosis, pulmonary, interstitial, diffuse (HCC)   GERD (gastroesophageal reflux disease)   COPD (chronic obstructive pulmonary disease) (HCC)   CHF (congestive heart failure) (HCC)   Anemia   Atrial fibrillation (HCC)   Non compliance w medication regimen   DNR (do not resuscitate)   Prolonged QT interval   Acute on chronic systolic (congestive) heart failure ([place])   Sepsis ([place])   Aspiration pneumonia of both lower lobes due to gastric secretions ([place])  #1.  Acute on chronic systolic congestive heart failure. --Patient has evidence of volume overload, significant paroxysmal nocturnal dyspnea, leg edema.  He has intermittent hypotension due to severe LV dysfunction.  Ejection fraction less than 20%. --Patient overall condition appears to be  end-stage congestive heart failure.  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 --transition from lasix gtt to oral lasix 20 mg BID, then held due to AKI.   Plan: --Hold diuresis due to worsening AKI --continue Farxiga 25m daily  --Hold BB due to hypotension, per pulm --Hold Entrestod/t hypotension.   Paroxysmal atrial fibrillation. --currently rate controlled --Due to pulmonary fibrosis, cannot be placed on amiodarone. 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 Severe hypoalbuminemia. Intermittent hypotension. Coagulopathy secondary to liver failure. Patient also has liver function changes with elevated bilirubin.  This is due to right-sided congestive heart failure. Patient condition is very serious, very high risk of mortality. 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  Severe sepsis. Aspiration pneumonia. Patient met sepsis criteria at time admission, he had tachycardia, tachypnea, hypotension, leukocytosis.  He also has significant lactic acidosis. Due to alcohol drinking, patient has high risk of aspiration pneumonia.  Patient also has Pseudomonas risk due to interstitial lung disease.   --procal elevated --finished 5 days of cefepime  4.  Alcohol abuse. Cocaine abuse. Continue alcohol withdrawal protocol.  Continue thiamine and folic acid.  #6.  Severe protein calorie malnutrition. Poor oral intake and possible swallow issues Patient had significant muscle atrophy, looks malnourished.   --SLP --switch to Dys 2 for now --cont protein supplement  #7.  Hypokalemia. --monitor and replete PRN  Constipation, resolved --taper back to Miralax 17 g daily  Unstageable ulcers over left shoulder  and sacral areas --wound care consulted, recommended surgical consult --Surgery consult on 3/31, does not recommend debridement.   DVT prophylaxis: lovenox Code Status: Family requested change back to Full code during current hospitalization, wants to continue full treatment Family Communication:  Disposition Plan:   Status is: inpatient   Dispo: The patient is from: Home              Anticipated d/c is to: Home              Patient currently is not medically stable to d/c.  Severe dyspnea    Difficult to place patient No    I/O last 3 completed shifts: In: 144 [P.O.:770; IV Piggyback:100] Out: 400 [Urine:400] No intake/output data recorded.     Consultants:   Cardiology  Procedures: None  Antimicrobials: Cefepime  Subjective: RN reported pt not eating much and also seemed to have some swallow issues.  Started having multiple BM's after scheduled miralax.     Objective: Vitals:   05/04/20 0646 05/04/20 0650 05/04/20 0838 05/04/20 1200  BP: 95/66  101/71 105/90  Pulse:   77 80  Resp: 20  18 20   Temp: 97.6 F (36.4 C)  (!) 97.5 F (36.4 C) 97.6 F (36.4 C)  TempSrc: Oral  Oral Oral  SpO2: 100%   100%  Weight:  78.4 kg    Height:        Intake/Output Summary (Last 24 hours) at 05/04/2020 1449 Last data filed at 05/04/2020 1300 Gross per 24 hour  Intake 480 ml  Output --  Net 480 ml   Filed Weights   05/02/20 0500 05/03/20 0348 05/04/20 0650  Weight: 77.6 kg 79 kg 78.4 kg    Examination:  Constitutional: NAD, sleeping but arousable, lying in the chair HEENT: conjunctivae and lids normal, EOMI CV: No cyanosis.   RESP: normal respiratory effort while sleeping, but more rapid shallow breathing after waking up, on RA Neuro: II - XII grossly intact.   Psych: anxious mood and affect.     Data Reviewed: I have personally reviewed following labs and imaging studies  CBC: Recent Labs  Lab 04/29/20 0934 04/30/20 0449 05/01/20 0528 05/02/20 0715  05/03/20 0809 05/04/20 0429  WBC 17.9* 18.7* 10.6* 9.3 10.0 10.5  NEUTROABS 15.4*  --  8.6*  --   --   --   HGB 15.5 13.2 11.5* 10.9* 11.9* 11.3*  HCT 45.5 38.6* 34.3* 31.8* 33.9* 33.0*  MCV 79.8* 80.4 81.3 78.7* 78.1* 78.8*  PLT 149* 127* 115* 150 168 315   Basic Metabolic Panel: Recent Labs  Lab 04/29/20 1420 04/30/20 0449 05/01/20 0528 05/02/20 0715 05/03/20 0809 05/04/20 0429  NA  --  138 134* 133* 133* 133*  K  --  3.4* 3.1* 3.7 3.6 3.7  CL  --  109 94* 96* 97* 96*  CO2  --  23 26 23 22 24   GLUCOSE  --  88 108* 118* 102* 113*  BUN  --  27* 32* 39* 44* 50*  CREATININE  --  0.64 0.89 1.25* 1.39* 1.71*  CALCIUM  --  5.6* 7.6* 8.2* 8.4* 8.4*  MG 2.3  --  1.7 2.1 2.1 2.4  PHOS 2.6  --  2.5  --   --   --    GFR: Estimated Creatinine Clearance: 45.4 mL/min (A) (by C-G formula based on SCr of 1.71 mg/dL (H)). Liver Function Tests: Recent Labs  Lab 04/29/20 0934 04/30/20 0449 05/01/20 0528  AST 51* 39 24  ALT 59* 25 25  ALKPHOS 195* 106 107  BILITOT 5.2* 3.0* 2.7*  PROT 9.1* 4.9* 6.0*  ALBUMIN 2.9* 1.6* 2.8*   Recent Labs  Lab 04/29/20 0934  LIPASE 29   No results for input(s): AMMONIA in the last 168 hours. Coagulation Profile: Recent Labs  Lab 04/30/20 0449  INR 1.6*   Cardiac Enzymes: No results for input(s): CKTOTAL, CKMB, CKMBINDEX, TROPONINI in the last 168 hours. BNP (last 3 results) No results for input(s): PROBNP in the last 8760 hours. HbA1C: No results for input(s): HGBA1C in the last 72 hours. CBG: No results for input(s): GLUCAP in the last 168 hours. Lipid Profile: No results for input(s): CHOL, HDL, LDLCALC, TRIG, CHOLHDL, LDLDIRECT in the last 72 hours. Thyroid Function Tests: No results for input(s): TSH, T4TOTAL, FREET4, T3FREE, THYROIDAB in the last 72 hours. Anemia Panel: No results for input(s): VITAMINB12, FOLATE, FERRITIN, TIBC, IRON, RETICCTPCT in the last 72 hours. Sepsis Labs: Recent Labs  Lab 04/29/20 0934 04/29/20 1043  04/29/20 1220 04/30/20 0449 04/30/20 1242 05/01/20 0528  PROCALCITON  --  3.66  --  2.87  --  3.97  LATICACIDVEN 2.4*  --  3.2*  --  2.7*  --     Recent Results (from the past 240 hour(s))  Culture, blood (routine x 2)     Status: None   Collection Time: 04/29/20  9:34 AM   Specimen: BLOOD  Result Value Ref Range Status   Specimen Description BLOOD RIGHT ARM  Final   Special Requests   Final    BOTTLES DRAWN AEROBIC AND ANAEROBIC Blood Culture adequate volume   Culture   Final    NO GROWTH 5 DAYS Performed at [facility], [address] [place], [place] [address]    Report Status 05/04/2020 FINAL  Final  Culture, blood (routine x 2)     Status: None   Collection Time: 04/29/20  9:34 AM   Specimen: BLOOD  Result Value Ref Range Status   Specimen Description BLOOD BLOOD LEFT ARM  Final   Special Requests   Final    BOTTLES DRAWN AEROBIC AND ANAEROBIC Blood Culture adequate volume   Culture   Final    NO GROWTH 5 DAYS Performed at [facility], [address] [place], [place] [address]    Report Status 05/04/2020 FINAL  Final  SARS CORONAVIRUS 2 (TAT 6-24 HRS) Nasopharyngeal Nasopharyngeal Swab     Status: None   Collection Time: 04/29/20  9:40 AM   Specimen: Nasopharyngeal Swab  Result Value Ref Range Status   SARS Coronavirus 2 NEGATIVE NEGATIVE Final    Comment: (NOTE) SARS-CoV-2 target nucleic acids are NOT DETECTED.  The SARS-CoV-2 RNA is generally detectable in upper and lower respiratory specimens during the acute phase of infection. Negative results do not preclude SARS-CoV-2 infection, do not rule out co-infections with other pathogens, and should not be used as the sole basis for treatment or other patient management decisions. Negative results must be combined with clinical observations, patient history, and epidemiological information. The expected result is Negative.  Fact Sheet for  Patients: SugarRoll.be  Fact Sheet for Healthcare Providers: https://www.woods-mathews.com/  This test is not yet approved or cleared by the [place] FDA and  has been authorized for detection and/or diagnosis of SARS-CoV-2 by FDA under an Emergency Use Authorization (EUA). This EUA will remain  in effect (meaning this test can be used) for the duration of the COVID-19 declaration under Se ction 564(b)(1) of the  Act, 21 U.S.C. section 360bbb-3(b)(1), unless the authorization is terminated or revoked sooner.  Performed at [facility], [place] [address], [place], [place] [address]          Radiology Studies: No results found.      Scheduled Meds: . atorvastatin  40 mg Oral Daily  . calcium carbonate  400 mg of elemental calcium Oral TID WC  . collagenase   Topical Daily  . dapagliflozin propanediol  10 mg Oral Daily  . enoxaparin (LOVENOX) injection  40 mg Subcutaneous Q24H  . feeding supplement  237 mL Oral BID BM  . fludrocortisone  0.1 mg Oral Daily  . folic acid  1 mg Oral Daily  . midodrine  10 mg Oral TID WC  . multivitamin with minerals  1 tablet Oral Daily  . [START ON 05/05/2020] polyethylene glycol  17 g Oral Daily  . predniSONE  20 mg Oral Q breakfast  . thiamine  100 mg Oral Daily   Or  . thiamine  100 mg Intravenous Daily   Continuous Infusions:    LOS: 4 days     Enzo Bi, MD [facility]   To contact the attending provider between 7A-7P or the covering provider during after hours 7P-7A, please log into the web site [email or web address] and access using universal [place] password for that web site. If you do not have the password, please call the hospital operator.  05/04/2020, 2:49 PM

## 2020-05-04 NOTE — Progress Notes (Signed)
Pulmonary Medicine          Date: 05/04/2020,   MRN# [id number] ZYRELL CARMEAN 12/05/1950     AdmissionWeight: 74.8 kg                 CurrentWeight: 78.4 kg   Referring physician: Dr Billie Ruddy   CHIEF COMPLAINT:   Acute on chronic hypoxemic respiratory failure in context of pulmonary fibrosis.    HISTORY OF PRESENT ILLNESS   70 yo M w PMH of CHF , COPD, substance abuse , Pulmonary fibrosis, emphysema, chronic hypoxemic respiratory failure, chronic cough who has had recurrent hospitalization for respiratory failure. He has previously been referred to hospice due to comorbid health status however has declined comfort care measures and wishes to continue therapy. He had Transthoracic echo 04/18/20 and it showed severe CHF with EF <38%, RV systolic fucntion is also reduced, there is severe MR. He had CT chest done 12/2019 with findings of basal predominant bilateral pulmonary fibrosis with associated traction bronchiectasis and concomitant upper lobe predominant centriobular emphysema with mild to moderate pleural effusions. Pictorial documentation is included below. Pulmonary consultation placed per patients wishes to have more respiratory workup to help his chronic lung disease.    05/04/20-Patient reports improvement clinically. He feels less dyspnea and wants to try physical therapy more. He has less LE edema.  His urine is dark and renal function worse, discussed with attending physician will hold diuresis. We reviewed incentive spirometry techinique, he is very deconditioned takes volumes up to 350cc only.  He is on room air and BP is low and HR is 74 despite stopping beta blocker and placing on midodrine 10 TID. Cardiology is on case will review regarding re-initiation of beta blockade in lieu of CHF but currently I would favor holding until further guidance due to risk of hypotension.  He is on florinef and prednisone 20 mg due to adrenal insufficiency and interstitial lung disease,  this was started yesterday as salvage therapy for this patient.  We discussed smoking cessation today, he smoked Thursday and states the has not smoked in the hospital which is good.   PAST MEDICAL HISTORY   Past Medical History:  Diagnosis Date  . CHF (congestive heart failure) ([place])   . Chronic cough   . COPD (chronic obstructive pulmonary disease) ([place])   . Elevated liver function tests   . Emphysema of lung ([place])   . Fibrosis, pulmonary, interstitial, diffuse ([place])   . GERD (gastroesophageal reflux disease)   . History of cocaine abuse ([place])   . Mediastinal lymphadenopathy   . Pulmonary fibrosis ([place])   . Right inguinal hernia      SURGICAL HISTORY   Past Surgical History:  Procedure Laterality Date  . COLONOSCOPY WITH PROPOFOL N/A 08/19/2018   Procedure: COLONOSCOPY WITH PROPOFOL;  Surgeon: Virgel Manifold, MD;  Location: [facility] ENDOSCOPY;  Service: Endoscopy;  Laterality: N/A;  . HERNIA REPAIR Right 1973   open  . INGUINAL HERNIA REPAIR Right 11/08/2014   Procedure: LAPAROSCOPIC RIGHT INGUINAL HERNIA REPAIR;  Surgeon: Hubbard Robinson, MD;  Location: [facility] ORS;  Service: General;  Laterality: Right;  . knee arthroscopy Right 1972  . RIGHT/LEFT HEART CATH AND CORONARY ANGIOGRAPHY Right 03/05/2017   Procedure: RIGHT/LEFT HEART CATH AND CORONARY ANGIOGRAPHY;  Surgeon: Dionisio David, MD;  Location: [facility];  Service: Cardiovascular;  Laterality: Right;     FAMILY HISTORY   Family History  Problem Relation Age of Onset  .  Diabetes Mother   . Cancer Father   . AAA (abdominal aortic aneurysm) Brother      SOCIAL HISTORY   Social History   Tobacco Use  . Smoking status: Never Smoker  . Smokeless tobacco: Never Used  Vaping Use  . Vaping Use: Never used  Substance Use Topics  . Alcohol use: Not Currently    Alcohol/week: 0.0 standard drinks    Comment: 2 quarts a week, he quit again 12/2018  . Drug use: Not Currently    Types: Cocaine     Comment: as an young adult      MEDICATIONS    Home Medication:    Current Medication:  Current Facility-Administered Medications:  .  acetaminophen (TYLENOL) tablet 1,000 mg, 1,000 mg, Oral, TID PRN, Enzo Bi, MD, 1,000 mg at 05/04/20 0029 .  atorvastatin (LIPITOR) tablet 40 mg, 40 mg, Oral, Daily, Cox, Amy N, DO, 40 mg at 05/03/20 1112 .  calcium carbonate (TUMS - dosed in mg elemental calcium) chewable tablet 400 mg of elemental calcium, 400 mg of elemental calcium, Oral, TID WC, Sharen Hones, MD, 400 mg of elemental calcium at 05/03/20 1857 .  collagenase (SANTYL) ointment, , Topical, Daily, Enzo Bi, MD, Given at 05/04/20 [phone] .  dapagliflozin propanediol (FARXIGA) tablet 10 mg, 10 mg, Oral, Daily, Cox, Amy N, DO, 10 mg at 05/03/20 1113 .  enoxaparin (LOVENOX) injection 40 mg, 40 mg, Subcutaneous, Q24H, Enzo Bi, MD, 40 mg at 05/03/20 1114 .  feeding supplement (ENSURE ENLIVE / ENSURE PLUS) liquid 237 mL, 237 mL, Oral, BID BM, Sharen Hones, MD, 237 mL at 05/01/20 1754 .  fludrocortisone (FLORINEF) tablet 0.1 mg, 0.1 mg, Oral, Daily, Lanney Gins, Theron Cumbie, MD, 0.1 mg at 05/03/20 2254 .  folic acid (FOLVITE) tablet 1 mg, 1 mg, Oral, Daily, Cox, Amy N, DO, 1 mg at 05/03/20 1112 .  furosemide (LASIX) injection 20 mg, 20 mg, Intravenous, Daily, Lanney Gins, Margeart Allender, MD, 20 mg at 05/03/20 2253 .  midodrine (PROAMATINE) tablet 10 mg, 10 mg, Oral, TID WC, Cox, Amy N, DO, 10 mg at 05/03/20 1857 .  multivitamin with minerals tablet 1 tablet, 1 tablet, Oral, Daily, Cox, Amy N, DO, 1 tablet at 05/03/20 1113 .  ondansetron (ZOFRAN) tablet 4 mg, 4 mg, Oral, Q6H PRN **OR** ondansetron (ZOFRAN) injection 4 mg, 4 mg, Intravenous, Q6H PRN, Cox, Amy N, DO, 4 mg at 05/04/20 0956 .  polyethylene glycol (MIRALAX / GLYCOLAX) packet 34 g, 34 g, Oral, BID, Enzo Bi, MD, 34 g at 05/03/20 1113 .  predniSONE (DELTASONE) tablet 20 mg, 20 mg, Oral, Q breakfast, Lanney Gins, Saydee Zolman, MD .  thiamine tablet 100 mg, 100 mg, Oral,  Daily, 100 mg at 05/03/20 1112 **OR** thiamine (B-1) injection 100 mg, 100 mg, Intravenous, Daily, Cox, Amy N, DO, 100 mg at 05/02/20 6010    ALLERGIES   Patient has no known allergies.     REVIEW OF SYSTEMS    Review of Systems:  Gen:  Denies  fever, sweats, chills weigh loss  HEENT: Denies blurred vision, double vision, ear pain, eye pain, hearing loss, nose bleeds, sore throat Cardiac:  No dizziness, chest pain or heaviness, chest tightness,edema Resp:   Denies cough or sputum porduction, shortness of breath,wheezing, hemoptysis,  Gi: Denies swallowing difficulty, stomach pain, nausea or vomiting, diarrhea, constipation, bowel incontinence Gu:  Denies bladder incontinence, burning urine Ext:   Denies Joint pain, stiffness or swelling Skin: Denies  skin rash, easy bruising or bleeding or hives Endoc:  Denies  polyuria, polydipsia , polyphagia or weight change Psych:   Denies depression, insomnia or hallucinations   Other:  All other systems negative   VS: BP 101/71 (BP Location: Left Arm)   Pulse 77   Temp (!) 97.5 F (36.4 C) (Oral)   Resp 18   Ht 6' (1.829 m)   Wt 78.4 kg   SpO2 100%   BMI 23.44 kg/m      PHYSICAL EXAM    GENERAL:NAD, no fevers, chills, no weakness no fatigue HEAD: Normocephalic, atraumatic.  EYES: Pupils equal, round, reactive to light. Extraocular muscles intact. No scleral icterus.  MOUTH: Moist mucosal membrane. Dentition intact. No abscess noted.  EAR, NOSE, THROAT: Clear without exudates. No external lesions.  NECK: Supple. No thyromegaly. No nodules. No JVD.  PULMONARY: crepitations bilaterally suggestive of pulmoary fibrosis worse at bases. CARDIOVASCULAR: S1 and S2. Regular rate and rhythm. No murmurs, rubs, or gallops. No edema. Pedal pulses 2+ bilaterally.  GASTROINTESTINAL: Soft, nontender, nondistended. No masses. Positive bowel sounds. No hepatosplenomegaly.  MUSCULOSKELETAL: No swelling, clubbing, or edema. Range of motion full  in all extremities.  NEUROLOGIC: Cranial nerves II through XII are intact. No gross focal neurological deficits. Sensation intact. Reflexes intact.  SKIN: No ulceration, lesions, rashes, or cyanosis. Skin warm and dry. Turgor intact.  PSYCHIATRIC: Mood, affect within normal limits. The patient is awake, alert and oriented x 3. Insight, judgment intact.       IMAGING    CT Abdomen Pelvis W Contrast  Result Date: 04/29/2020 CLINICAL DATA:  Acute nonlocalized abdominal pain EXAM: CT ABDOMEN AND PELVIS WITH CONTRAST TECHNIQUE: Multidetector CT imaging of the abdomen and pelvis was performed using the standard protocol following bolus administration of intravenous contrast. CONTRAST:  160mL OMNIPAQUE IOHEXOL 300 MG/ML  SOLN COMPARISON:  07/20/2013 FINDINGS: Lower chest: Small bilateral pleural effusion. Fibrosis with honeycombing is seen diffusely at the lung bases. Heart size is within normal limits. Gynecomastia. Hepatobiliary: Heterogeneous density of the liver with nutmeg appearance. Subcapsular right hepatic cyst. No solid masslike finding.Calcification at the gallbladder fossa which appears extraluminal and is chronic. No evidence of biliary inflammation or obstruction. Pancreas: Unremarkable. Spleen: Unremarkable. Adrenals/Urinary Tract: Negative adrenals. No hydronephrosis or stone. Small renal cystic densities. Unremarkable bladder. Stomach/Bowel:  No obstruction. No visible inflammation of bowel. Vascular/Lymphatic: No acute vascular abnormality. Scattered atheromatous calcification of the aorta. No mass or adenopathy. Reproductive:Partially covered right hydrocele Other: No ascites or pneumoperitoneum.  Hernia repair using mesh. Musculoskeletal: No acute abnormalities.  Lumbar spine degeneration. IMPRESSION: 1. No acute intra-abdominal finding. 2. Heterogeneous liver perfusion which could be from fibrosis or right heart failure. 3. Small pleural effusions. 4.  Aortic Atherosclerosis (ICD10-I70.0).  Electronically Signed   By: Monte Fantasia M.D.   On: 04/29/2020 11:31   US RENAL  Result Date: 04/20/2020 CLINICAL DATA:  AK I EXAM: RENAL / URINARY TRACT ULTRASOUND COMPLETE COMPARISON:  None. FINDINGS: Right Kidney: Renal measurements: 11.3 x 5.3 x 6.0 cm = volume: 187 mL. Echogenicity within normal limits. No mass or hydronephrosis visualized. Left Kidney: Renal measurements: 11.7 x 5.5 x 5.8 cm = volume: 194 mL. Echogenicity within normal limits. No mass or hydronephrosis visualized. Bladder: Decompressed with Foley catheter in place. Other: None. IMPRESSION: Unremarkable sonographic appearance of the bilateral kidneys. Electronically Signed   By: Audie Pinto M.D.   On: 04/20/2020 11:49   DG Chest Port 1 View  Result Date: 04/29/2020 CLINICAL DATA:  Shortness of breath and pain. EXAM: PORTABLE CHEST 1 VIEW COMPARISON:  04/18/2020  FINDINGS: Stable cardiomediastinal contours. Extensive fibrotic lung disease is identified within both lungs. This is most advanced within the mid and lower lung zones. No definite superimposed pulmonary opacity. There is a suggestion of subcutaneous gas within the soft tissues in the left supraclavicular region. IMPRESSION: 1. Extensive fibrotic lung disease. 2. No superimposed acute cardiopulmonary abnormality. Electronically Signed   By: Kerby Moors M.D.   On: 04/29/2020 10:43   DG Chest Port 1 View  Result Date: 04/18/2020 CLINICAL DATA:  Chest pain EXAM: PORTABLE CHEST 1 VIEW COMPARISON:  December 08, 2019 chest radiograph; chest CT December 09, 2019 FINDINGS: There is extensive fibrosis throughout the lungs, most severely in the mid and lower lung regions. There are small pleural effusions bilaterally. The heart size is within normal limits. Pulmonary vascularity is within normal limits. Lymph node prominence noted on prior CT is not well delineated radiography. No bone lesions. IMPRESSION: Extensive underlying fibrosis with small pleural effusions  bilaterally. No appreciable new opacity compared to prior studies. Stable cardiac silhouette. Electronically Signed   By: Lowella Grip III M.D.   On: 04/18/2020 09:04   ECHOCARDIOGRAM COMPLETE  Result Date: 04/19/2020    ECHOCARDIOGRAM REPORT   Patient Name:   DEARL RUDDEN Date of Exam: 04/18/2020 Medical Rec #:  [id number]       Height:       69.0 in Accession #:    [id number]      Weight:       171.0 lb Date of Birth:  1950-04-07      BSA:          1.933 m Patient Age:    40 years        BP:           100/68 mmHg Patient Gender: M               HR:           94 bpm. Exam Location:  [facility] Procedure: 2D Echo, Cardiac Doppler and Color Doppler Indications:     CHF-acute systolic E31.54  History:         Patient has prior history of Echocardiogram examinations, most                  recent 12/09/2019. CHF; COPD.  Sonographer:     Sherrie Sport RDCS (AE) Referring Phys:  [id number] [place] Diagnosing Phys: Neoma Laming MD  Sonographer Comments: Suboptimal parasternal window. IMPRESSIONS  1. Left ventricular ejection fraction, by estimation, is <20%. The left ventricle has severely decreased function. The left ventricle demonstrates global hypokinesis. The left ventricular internal cavity size was severely dilated. There is mild concentric left ventricular hypertrophy. Left ventricular diastolic parameters are consistent with Grade II diastolic dysfunction (pseudonormalization).  2. Right ventricular systolic function is mildly reduced. The right ventricular size is moderately enlarged.  3. Left atrial size was severely dilated.  4. Right atrial size was severely dilated.  5. 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 No evidence of mitral stenosis.  6. The aortic valve is grossly normal. Aortic valve regurgitation is not visualized. Mild aortic valve sclerosis is present, with no evidence of aortic valve stenosis. FINDINGS  Left Ventricle: Left ventricular ejection fraction, by  estimation, is <20%. The left ventricle has severely decreased function. The left ventricle demonstrates global hypokinesis. The left ventricular internal cavity size was severely dilated. There is mild concentric left ventricular hypertrophy. Left ventricular diastolic parameters are consistent with Grade  II diastolic dysfunction (pseudonormalization). Right Ventricle: The right ventricular size is moderately enlarged. No increase in right ventricular wall thickness. Right ventricular systolic function is mildly reduced. Left Atrium: Left atrial size was severely dilated. Right Atrium: Right atrial size was severely dilated. Pericardium: There is no evidence of pericardial effusion. Mitral Valve: 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 No evidence of mitral valve stenosis. Tricuspid Valve: The tricuspid valve is grossly normal. Tricuspid valve regurgitation is mild. Aortic Valve: The aortic valve is grossly normal. Aortic valve regurgitation is not visualized. Mild aortic valve sclerosis is present, with no evidence of aortic valve stenosis. Aortic valve mean gradient measures 1.0 mmHg. Aortic valve peak gradient measures 1.7 mmHg. Aortic valve area, by VTI measures 1.85 cm. Pulmonic Valve: The pulmonic valve was normal in structure. Pulmonic valve regurgitation is trivial. Aorta: The aortic root, ascending aorta and aortic arch are all structurally normal, with no evidence of dilitation or obstruction. IAS/Shunts: The interatrial septum was not assessed.  LEFT VENTRICLE PLAX 2D LVIDd:         5.57 cm LVIDs:         5.01 cm LV PW:         0.90 cm LV IVS:        0.78 cm LVOT diam:     2.10 cm LV SV:         13 LV SV Index:   7 LVOT Area:     3.46 cm  LV Volumes (MOD) LV vol d, MOD A2C: 115.0 ml LV vol d, MOD A4C: 194.0 ml LV vol s, MOD A2C: 91.0 ml LV vol s, MOD A4C: 154.0 ml LV SV MOD A2C:     24.0 ml LV SV MOD A4C:     194.0 ml LV SV MOD BP:      27.8 ml RIGHT VENTRICLE RV S prime:      9.79 cm/s TAPSE (M-mode): 3.7 cm LEFT ATRIUM             Index       RIGHT ATRIUM           Index LA diam:        4.30 cm 2.22 cm/m  RA Area:     16.10 cm LA Vol (A2C):   84.0 ml 43.46 ml/m RA Volume:   43.50 ml  22.50 ml/m LA Vol (A4C):   56.0 ml 28.97 ml/m LA Biplane Vol: 69.5 ml 35.96 ml/m  AORTIC VALVE                   PULMONIC VALVE AV Area (Vmax):    1.41 cm    PV Vmax:        0.18 m/s AV Area (Vmean):   1.72 cm    PV Peak grad:   0.1 mmHg AV Area (VTI):     1.85 cm    RVOT Peak grad: 1 mmHg AV Vmax:           66.00 cm/s AV Vmean:          42.200 cm/s AV VTI:            0.072 m AV Peak Grad:      1.7 mmHg AV Mean Grad:      1.0 mmHg LVOT Vmax:         26.80 cm/s LVOT Vmean:        21.000 cm/s LVOT VTI:          0.039 m  LVOT/AV VTI ratio: 0.54  AORTA Ao Root diam: 2.83 cm MITRAL VALVE                TRICUSPID VALVE MV Area (PHT): 5.16 cm     TR Peak grad:   32.7 mmHg MV Decel Time: 147 msec     TR Vmax:        286.00 cm/s MV E velocity: 118.00 cm/s                             SHUNTS                             Systemic VTI:  0.04 m                             Systemic Diam: 2.10 cm Neoma Laming MD Electronically signed by Neoma Laming MD Signature Date/Time: 04/19/2020/8:09:07 AM    Final       ASSESSMENT/PLAN    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  Will dc cefepime. continue zithromax only 250 PO.     - prednisone 20 mg po daily and florinef .1 tid      Acute on chronic systolic CHF   - he has 1+ LE edema but he has effusions bilaterally, will try to gently diurese with albumin    Severe protein calorie malnutrition  bitemporal wasting and peripheral muscle wasting.  -Nutrition and PT/OT   Thank you for allowing me to participate in the care of this patient.   Patient/Family are satisfied with care plan and all questions have been  answered.  This document was prepared using Dragon voice recognition software and may include unintentional dictation errors.     Ottie Glazier, M.D.  Division of [place]

## 2020-05-04 NOTE — Progress Notes (Signed)
SUBJECTIVE: Patient appears to be quite short of breath   Vitals:   05/03/20 2358 05/04/20 0646 05/04/20 0650 05/04/20 0838  BP: 91/67 95/66  101/71  Pulse: 78   77  Resp: 18 20  18   Temp: 97.6 F (36.4 C) 97.6 F (36.4 C)  (!) 97.5 F (36.4 C)  TempSrc: Axillary Oral  Oral  SpO2: 97% 100%    Weight:   78.4 kg   Height:        Intake/Output Summary (Last 24 hours) at 05/04/2020 1201 Last data filed at 05/04/2020 0033 Gross per 24 hour  Intake 480 ml  Output --  Net 480 ml    LABS: Basic Metabolic Panel: Recent Labs    05/03/20 0809 05/04/20 0429  NA 133* 133*  K 3.6 3.7  CL 97* 96*  CO2 22 24  GLUCOSE 102* 113*  BUN 44* 50*  CREATININE 1.39* 1.71*  CALCIUM 8.4* 8.4*  MG 2.1 2.4   Liver Function Tests: No results for input(s): AST, ALT, ALKPHOS, BILITOT, PROT, ALBUMIN in the last 72 hours. No results for input(s): LIPASE, AMYLASE in the last 72 hours. CBC: Recent Labs    05/03/20 0809 05/04/20 0429  WBC 10.0 10.5  HGB 11.9* 11.3*  HCT 33.9* 33.0*  MCV 78.1* 78.8*  PLT 168 172   Cardiac Enzymes: No results for input(s): CKTOTAL, CKMB, CKMBINDEX, TROPONINI in the last 72 hours. BNP: Invalid input(s): POCBNP D-Dimer: No results for input(s): DDIMER in the last 72 hours. Hemoglobin A1C: No results for input(s): HGBA1C in the last 72 hours. Fasting Lipid Panel: No results for input(s): CHOL, HDL, LDLCALC, TRIG, CHOLHDL, LDLDIRECT in the last 72 hours. Thyroid Function Tests: No results for input(s): TSH, T4TOTAL, T3FREE, THYROIDAB in the last 72 hours.  Invalid input(s): FREET3 Anemia Panel: No results for input(s): VITAMINB12, FOLATE, FERRITIN, TIBC, IRON, RETICCTPCT in the last 72 hours.   PHYSICAL EXAM General: Well developed, well nourished, in no acute distress HEENT:  Normocephalic and atramatic Neck:  No JVD.  Lungs: Clear bilaterally to auscultation and percussion. Heart: HRRR . Normal S1 and S2 without gallops or murmurs.  Abdomen: Bowel  sounds are positive, abdomen soft and non-tender  Msk:  Back normal, normal gait. Normal strength and tone for age. Extremities: No clubbing, cyanosis or edema.   Neuro: Alert and oriented X 3. Psych:  Good affect, responds appropriately  TELEMETRY: Sinus rhythm  ASSESSMENT AND PLAN: End-stage congestive heart failure with inability to tolerate medication and atrial fibrillation.  Advise palliative care and advised making the patient DNR. Principal Problem:   Shortness of breath Active Problems:   Arteriosclerosis of coronary artery   HTN (hypertension)   Interstitial lung disease (HCC)   Chronic cough   Degenerative arthritis of lumbar spine   Chronic systolic CHF (congestive heart failure) (HCC)   AF (paroxysmal atrial fibrillation) (HCC)   Rheumatoid arthritis involving multiple sites with positive rheumatoid factor (HCC)   Fibrosis, pulmonary, interstitial, diffuse (HCC)   GERD (gastroesophageal reflux disease)   COPD (chronic obstructive pulmonary disease) (HCC)   CHF (congestive heart failure) (HCC)   Anemia   Atrial fibrillation (HCC)   Non compliance w medication regimen   DNR (do not resuscitate)   Prolonged QT interval   Acute on chronic systolic (congestive) heart failure ([place])   Sepsis (HCC)   Aspiration pneumonia of both lower lobes due to gastric secretions (HCC)    Neoma Laming A, MD, [facility] 05/04/2020 12:01 PM

## 2020-05-05 DIAGNOSIS — R0602 Shortness of breath: Secondary | ICD-10-CM | POA: Diagnosis not present

## 2020-05-05 LAB — BASIC METABOLIC PANEL
Anion gap: 16 — ABNORMAL HIGH (ref 5–15)
BUN: 65 mg/dL — ABNORMAL HIGH (ref 8–23)
CO2: 21 mmol/L — ABNORMAL LOW (ref 22–32)
Calcium: 8.5 mg/dL — ABNORMAL LOW (ref 8.9–10.3)
Chloride: 97 mmol/L — ABNORMAL LOW (ref 98–111)
Creatinine, Ser: 2.41 mg/dL — ABNORMAL HIGH (ref 0.61–1.24)
GFR, Estimated: 29 mL/min — ABNORMAL LOW (ref >60)
Glucose, Bld: 131 mg/dL — ABNORMAL HIGH (ref 70–99)
Potassium: 4.5 mmol/L (ref 3.5–5.1)
Sodium: 134 mmol/L — ABNORMAL LOW (ref 135–145)

## 2020-05-05 LAB — CBC
HCT: 35.2 % — ABNORMAL LOW (ref 39.0–52.0)
Hemoglobin: 12 g/dL — ABNORMAL LOW (ref 13.0–17.0)
MCH: 26.8 pg (ref 26.0–34.0)
MCHC: 34.1 g/dL (ref 30.0–36.0)
MCV: 78.7 fL — ABNORMAL LOW (ref 80.0–100.0)
Platelets: 196 10*3/uL (ref 150–400)
RBC: 4.47 MIL/uL (ref 4.22–5.81)
RDW: 15.5 % (ref 11.5–15.5)
WBC: 9.1 10*3/uL (ref 4.0–10.5)
nRBC: 0.3 % — ABNORMAL HIGH (ref 0.0–0.2)

## 2020-05-05 LAB — MAGNESIUM: Magnesium: 2.6 mg/dL — ABNORMAL HIGH (ref 1.7–2.4)

## 2020-05-05 NOTE — Progress Notes (Signed)
Pulmonary Medicine          Date: 05/05/2020,   MRN# [id number] Jason Fitzgerald 17-Oct-1950     AdmissionWeight: 74.8 kg                 CurrentWeight: 81.7 kg   Referring physician: Dr Billie Ruddy   CHIEF COMPLAINT:   Acute on chronic hypoxemic respiratory failure in context of pulmonary fibrosis.    HISTORY OF PRESENT ILLNESS   70 yo M w PMH of CHF , COPD, substance abuse , Pulmonary fibrosis, emphysema, chronic hypoxemic respiratory failure, chronic cough who has had recurrent hospitalization for respiratory failure. He has previously been referred to hospice due to comorbid health status however has declined comfort care measures and wishes to continue therapy. He had Transthoracic echo 04/18/20 and it showed severe CHF with EF <28%, RV systolic fucntion is also reduced, there is severe MR. He had CT chest done 12/2019 with findings of basal predominant bilateral pulmonary fibrosis with associated traction bronchiectasis and concomitant upper lobe predominant centriobular emphysema with mild to moderate pleural effusions. Pictorial documentation is included below. Pulmonary consultation placed per patients wishes to have more respiratory workup to help his chronic lung disease.    05/04/20-Patient reports improvement clinically. He feels less dyspnea and wants to try physical therapy more. He has less LE edema.  His urine is dark and renal function worse, discussed with attending physician will hold diuresis. We reviewed incentive spirometry techinique, he is very deconditioned takes volumes up to 350cc only.  He is on room air and BP is low and HR is 74 despite stopping beta blocker and placing on midodrine 10 TID. Cardiology is on case will review regarding re-initiation of beta blockade in lieu of CHF but currently I would favor holding until further guidance due to risk of hypotension.  He is on florinef and prednisone 20 mg due to adrenal insufficiency and interstitial lung disease,  this was started yesterday as salvage therapy for this patient.  We discussed smoking cessation today, he smoked Thursday and states the has not smoked in the hospital which is good.   05/05/20- Patient is sitting up in bed in no distress. He refused blood work today and I discussed this with him.  He worked with [facility] service and is improved. Reviewed care plan with Dr Billie Ruddy today.   PAST MEDICAL HISTORY   Past Medical History:  Diagnosis Date  . CHF (congestive heart failure) ([place])   . Chronic cough   . COPD (chronic obstructive pulmonary disease) ([place])   . Elevated liver function tests   . Emphysema of lung ([place])   . Fibrosis, pulmonary, interstitial, diffuse ([place])   . GERD (gastroesophageal reflux disease)   . History of cocaine abuse ([place])   . Mediastinal lymphadenopathy   . Pulmonary fibrosis ([place])   . Right inguinal hernia      SURGICAL HISTORY   Past Surgical History:  Procedure Laterality Date  . COLONOSCOPY WITH PROPOFOL N/A 08/19/2018   Procedure: COLONOSCOPY WITH PROPOFOL;  Surgeon: Virgel Manifold, MD;  Location: [facility] ENDOSCOPY;  Service: Endoscopy;  Laterality: N/A;  . HERNIA REPAIR Right 1973   open  . INGUINAL HERNIA REPAIR Right 11/08/2014   Procedure: LAPAROSCOPIC RIGHT INGUINAL HERNIA REPAIR;  Surgeon: Hubbard Robinson, MD;  Location: [facility] ORS;  Service: General;  Laterality: Right;  . knee arthroscopy Right 1972  . RIGHT/LEFT HEART CATH AND CORONARY ANGIOGRAPHY Right 03/05/2017   Procedure: RIGHT/LEFT HEART  CATH AND CORONARY ANGIOGRAPHY;  Surgeon: Dionisio David, MD;  Location: [facility];  Service: Cardiovascular;  Laterality: Right;     FAMILY HISTORY   Family History  Problem Relation Age of Onset  . Diabetes Mother   . Cancer Father   . AAA (abdominal aortic aneurysm) Brother      SOCIAL HISTORY   Social History   Tobacco Use  . Smoking status: Never Smoker  . Smokeless tobacco: Never Used  Vaping Use  . Vaping Use: Never  used  Substance Use Topics  . Alcohol use: Not Currently    Alcohol/week: 0.0 standard drinks    Comment: 2 quarts a week, he quit again 12/2018  . Drug use: Not Currently    Types: Cocaine    Comment: as an young adult      MEDICATIONS    Home Medication:    Current Medication:  Current Facility-Administered Medications:  .  acetaminophen (TYLENOL) tablet 1,000 mg, 1,000 mg, Oral, TID PRN, Enzo Bi, MD, 1,000 mg at 05/05/20 0945 .  atorvastatin (LIPITOR) tablet 40 mg, 40 mg, Oral, Daily, Cox, Amy N, DO, 40 mg at 05/05/20 0946 .  calcium carbonate (TUMS - dosed in mg elemental calcium) chewable tablet 400 mg of elemental calcium, 400 mg of elemental calcium, Oral, TID WC, Sharen Hones, MD, 400 mg of elemental calcium at 05/05/20 0946 .  collagenase (SANTYL) ointment, , Topical, Daily, Enzo Bi, MD, Given at 05/04/20 [phone] .  dapagliflozin propanediol (FARXIGA) tablet 10 mg, 10 mg, Oral, Daily, Cox, Amy N, DO, 10 mg at 05/05/20 0948 .  enoxaparin (LOVENOX) injection 40 mg, 40 mg, Subcutaneous, Q24H, Enzo Bi, MD, 40 mg at 05/05/20 1113 .  feeding supplement (ENSURE ENLIVE / ENSURE PLUS) liquid 237 mL, 237 mL, Oral, BID BM, Sharen Hones, MD, 237 mL at 05/01/20 1754 .  fludrocortisone (FLORINEF) tablet 0.1 mg, 0.1 mg, Oral, Daily, Lanney Gins, Emmert Roethler, MD, 0.1 mg at 05/05/20 0948 .  folic acid (FOLVITE) tablet 1 mg, 1 mg, Oral, Daily, Cox, Amy N, DO, 1 mg at 05/05/20 0946 .  midodrine (PROAMATINE) tablet 10 mg, 10 mg, Oral, TID WC, Cox, Amy N, DO, 10 mg at 05/05/20 0946 .  multivitamin with minerals tablet 1 tablet, 1 tablet, Oral, Daily, Cox, Amy N, DO, 1 tablet at 05/05/20 0945 .  ondansetron (ZOFRAN) tablet 4 mg, 4 mg, Oral, Q6H PRN **OR** ondansetron (ZOFRAN) injection 4 mg, 4 mg, Intravenous, Q6H PRN, Cox, Amy N, DO, 4 mg at 05/04/20 0956 .  polyethylene glycol (MIRALAX / GLYCOLAX) packet 17 g, 17 g, Oral, Daily, Enzo Bi, MD, 17 g at 05/05/20 0948 .  predniSONE (DELTASONE) tablet 20  mg, 20 mg, Oral, Q breakfast, Van Ehlert, MD, 20 mg at 05/05/20 0946 .  thiamine tablet 100 mg, 100 mg, Oral, Daily, 100 mg at 05/05/20 0945 **OR** thiamine (B-1) injection 100 mg, 100 mg, Intravenous, Daily, Cox, Amy N, DO, 100 mg at 05/02/20 2841    ALLERGIES   Patient has no known allergies.     REVIEW OF SYSTEMS    Review of Systems:  Gen:  Denies  fever, sweats, chills weigh loss  HEENT: Denies blurred vision, double vision, ear pain, eye pain, hearing loss, nose bleeds, sore throat Cardiac:  No dizziness, chest pain or heaviness, chest tightness,edema Resp:   Denies cough or sputum porduction, shortness of breath,wheezing, hemoptysis,  Gi: Denies swallowing difficulty, stomach pain, nausea or vomiting, diarrhea, constipation, bowel incontinence Gu:  Denies bladder  incontinence, burning urine Ext:   Denies Joint pain, stiffness or swelling Skin: Denies  skin rash, easy bruising or bleeding or hives Endoc:  Denies polyuria, polydipsia , polyphagia or weight change Psych:   Denies depression, insomnia or hallucinations   Other:  All other systems negative   VS: BP 100/81 (BP Location: Left Arm)   Pulse 77   Temp 98.3 F (36.8 C)   Resp 16   Ht 6' (1.829 m)   Wt 81.7 kg   SpO2 99%   BMI 24.43 kg/m      PHYSICAL EXAM    GENERAL:NAD, no fevers, chills, no weakness no fatigue HEAD: Normocephalic, atraumatic.  EYES: Pupils equal, round, reactive to light. Extraocular muscles intact. No scleral icterus.  MOUTH: Moist mucosal membrane. Dentition intact. No abscess noted.  EAR, NOSE, THROAT: Clear without exudates. No external lesions.  NECK: Supple. No thyromegaly. No nodules. No JVD.  PULMONARY: crepitations bilaterally suggestive of pulmoary fibrosis worse at bases. CARDIOVASCULAR: S1 and S2. Regular rate and rhythm. No murmurs, rubs, or gallops. No edema. Pedal pulses 2+ bilaterally.  GASTROINTESTINAL: Soft, nontender, nondistended. No masses. Positive bowel  sounds. No hepatosplenomegaly.  MUSCULOSKELETAL: No swelling, clubbing, or edema. Range of motion full in all extremities.  NEUROLOGIC: Cranial nerves II through XII are intact. No gross focal neurological deficits. Sensation intact. Reflexes intact.  SKIN: No ulceration, lesions, rashes, or cyanosis. Skin warm and dry. Turgor intact.  PSYCHIATRIC: Mood, affect within normal limits. The patient is awake, alert and oriented x 3. Insight, judgment intact.       IMAGING    CT Abdomen Pelvis W Contrast  Result Date: 04/29/2020 CLINICAL DATA:  Acute nonlocalized abdominal pain EXAM: CT ABDOMEN AND PELVIS WITH CONTRAST TECHNIQUE: Multidetector CT imaging of the abdomen and pelvis was performed using the standard protocol following bolus administration of intravenous contrast. CONTRAST:  135mL OMNIPAQUE IOHEXOL 300 MG/ML  SOLN COMPARISON:  07/20/2013 FINDINGS: Lower chest: Small bilateral pleural effusion. Fibrosis with honeycombing is seen diffusely at the lung bases. Heart size is within normal limits. Gynecomastia. Hepatobiliary: Heterogeneous density of the liver with nutmeg appearance. Subcapsular right hepatic cyst. No solid masslike finding.Calcification at the gallbladder fossa which appears extraluminal and is chronic. No evidence of biliary inflammation or obstruction. Pancreas: Unremarkable. Spleen: Unremarkable. Adrenals/Urinary Tract: Negative adrenals. No hydronephrosis or stone. Small renal cystic densities. Unremarkable bladder. Stomach/Bowel:  No obstruction. No visible inflammation of bowel. Vascular/Lymphatic: No acute vascular abnormality. Scattered atheromatous calcification of the aorta. No mass or adenopathy. Reproductive:Partially covered right hydrocele Other: No ascites or pneumoperitoneum.  Hernia repair using mesh. Musculoskeletal: No acute abnormalities.  Lumbar spine degeneration. IMPRESSION: 1. No acute intra-abdominal finding. 2. Heterogeneous liver perfusion which could be  from fibrosis or right heart failure. 3. Small pleural effusions. 4.  Aortic Atherosclerosis (ICD10-I70.0). Electronically Signed   By: Monte Fantasia M.D.   On: 04/29/2020 11:31   US RENAL  Result Date: 04/20/2020 CLINICAL DATA:  AK I EXAM: RENAL / URINARY TRACT ULTRASOUND COMPLETE COMPARISON:  None. FINDINGS: Right Kidney: Renal measurements: 11.3 x 5.3 x 6.0 cm = volume: 187 mL. Echogenicity within normal limits. No mass or hydronephrosis visualized. Left Kidney: Renal measurements: 11.7 x 5.5 x 5.8 cm = volume: 194 mL. Echogenicity within normal limits. No mass or hydronephrosis visualized. Bladder: Decompressed with Foley catheter in place. Other: None. IMPRESSION: Unremarkable sonographic appearance of the bilateral kidneys. Electronically Signed   By: Audie Pinto M.D.   On: 04/20/2020 11:49   DG  Chest Port 1 View  Result Date: 04/29/2020 CLINICAL DATA:  Shortness of breath and pain. EXAM: PORTABLE CHEST 1 VIEW COMPARISON:  04/18/2020 FINDINGS: Stable cardiomediastinal contours. Extensive fibrotic lung disease is identified within both lungs. This is most advanced within the mid and lower lung zones. No definite superimposed pulmonary opacity. There is a suggestion of subcutaneous gas within the soft tissues in the left supraclavicular region. IMPRESSION: 1. Extensive fibrotic lung disease. 2. No superimposed acute cardiopulmonary abnormality. Electronically Signed   By: Kerby Moors M.D.   On: 04/29/2020 10:43   DG Chest Port 1 View  Result Date: 04/18/2020 CLINICAL DATA:  Chest pain EXAM: PORTABLE CHEST 1 VIEW COMPARISON:  December 08, 2019 chest radiograph; chest CT December 09, 2019 FINDINGS: There is extensive fibrosis throughout the lungs, most severely in the mid and lower lung regions. There are small pleural effusions bilaterally. The heart size is within normal limits. Pulmonary vascularity is within normal limits. Lymph node prominence noted on prior CT is not well delineated  radiography. No bone lesions. IMPRESSION: Extensive underlying fibrosis with small pleural effusions bilaterally. No appreciable new opacity compared to prior studies. Stable cardiac silhouette. Electronically Signed   By: Lowella Grip III M.D.   On: 04/18/2020 09:04   ECHOCARDIOGRAM COMPLETE  Result Date: 04/19/2020    ECHOCARDIOGRAM REPORT   Patient Name:   Jason Fitzgerald Date of Exam: 04/18/2020 Medical Rec #:  [id number]       Height:       69.0 in Accession #:    [id number]      Weight:       171.0 lb Date of Birth:  1951/01/20      BSA:          1.933 m Patient Age:    19 years        BP:           100/68 mmHg Patient Gender: M               HR:           94 bpm. Exam Location:  [facility] Procedure: 2D Echo, Cardiac Doppler and Color Doppler Indications:     CHF-acute systolic E70.35  History:         Patient has prior history of Echocardiogram examinations, most                  recent 12/09/2019. CHF; COPD.  Sonographer:     Sherrie Sport RDCS (AE) Referring Phys:  [id number] [place] Diagnosing Phys: Neoma Laming MD  Sonographer Comments: Suboptimal parasternal window. IMPRESSIONS  1. Left ventricular ejection fraction, by estimation, is <20%. The left ventricle has severely decreased function. The left ventricle demonstrates global hypokinesis. The left ventricular internal cavity size was severely dilated. There is mild concentric left ventricular hypertrophy. Left ventricular diastolic parameters are consistent with Grade II diastolic dysfunction (pseudonormalization).  2. Right ventricular systolic function is mildly reduced. The right ventricular size is moderately enlarged.  3. Left atrial size was severely dilated.  4. Right atrial size was severely dilated.  5. 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 No evidence of mitral stenosis.  6. The aortic valve is grossly normal. Aortic valve regurgitation is not visualized. Mild aortic valve sclerosis is present, with no  evidence of aortic valve stenosis. FINDINGS  Left Ventricle: Left ventricular ejection fraction, by estimation, is <20%. The left ventricle has severely decreased function. The left ventricle demonstrates global hypokinesis.  The left ventricular internal cavity size was severely dilated. There is mild concentric left ventricular hypertrophy. Left ventricular diastolic parameters are consistent with Grade II diastolic dysfunction (pseudonormalization). Right Ventricle: The right ventricular size is moderately enlarged. No increase in right ventricular wall thickness. Right ventricular systolic function is mildly reduced. Left Atrium: Left atrial size was severely dilated. Right Atrium: Right atrial size was severely dilated. Pericardium: There is no evidence of pericardial effusion. Mitral Valve: 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 No evidence of mitral valve stenosis. Tricuspid Valve: The tricuspid valve is grossly normal. Tricuspid valve regurgitation is mild. Aortic Valve: The aortic valve is grossly normal. Aortic valve regurgitation is not visualized. Mild aortic valve sclerosis is present, with no evidence of aortic valve stenosis. Aortic valve mean gradient measures 1.0 mmHg. Aortic valve peak gradient measures 1.7 mmHg. Aortic valve area, by VTI measures 1.85 cm. Pulmonic Valve: The pulmonic valve was normal in structure. Pulmonic valve regurgitation is trivial. Aorta: The aortic root, ascending aorta and aortic arch are all structurally normal, with no evidence of dilitation or obstruction. IAS/Shunts: The interatrial septum was not assessed.  LEFT VENTRICLE PLAX 2D LVIDd:         5.57 cm LVIDs:         5.01 cm LV PW:         0.90 cm LV IVS:        0.78 cm LVOT diam:     2.10 cm LV SV:         13 LV SV Index:   7 LVOT Area:     3.46 cm  LV Volumes (MOD) LV vol d, MOD A2C: 115.0 ml LV vol d, MOD A4C: 194.0 ml LV vol s, MOD A2C: 91.0 ml LV vol s, MOD A4C: 154.0 ml LV SV MOD  A2C:     24.0 ml LV SV MOD A4C:     194.0 ml LV SV MOD BP:      27.8 ml RIGHT VENTRICLE RV S prime:     9.79 cm/s TAPSE (M-mode): 3.7 cm LEFT ATRIUM             Index       RIGHT ATRIUM           Index LA diam:        4.30 cm 2.22 cm/m  RA Area:     16.10 cm LA Vol (A2C):   84.0 ml 43.46 ml/m RA Volume:   43.50 ml  22.50 ml/m LA Vol (A4C):   56.0 ml 28.97 ml/m LA Biplane Vol: 69.5 ml 35.96 ml/m  AORTIC VALVE                   PULMONIC VALVE AV Area (Vmax):    1.41 cm    PV Vmax:        0.18 m/s AV Area (Vmean):   1.72 cm    PV Peak grad:   0.1 mmHg AV Area (VTI):     1.85 cm    RVOT Peak grad: 1 mmHg AV Vmax:           66.00 cm/s AV Vmean:          42.200 cm/s AV VTI:            0.072 m AV Peak Grad:      1.7 mmHg AV Mean Grad:      1.0 mmHg LVOT Vmax:         26.80 cm/s  LVOT Vmean:        21.000 cm/s LVOT VTI:          0.039 m LVOT/AV VTI ratio: 0.54  AORTA Ao Root diam: 2.83 cm MITRAL VALVE                TRICUSPID VALVE MV Area (PHT): 5.16 cm     TR Peak grad:   32.7 mmHg MV Decel Time: 147 msec     TR Vmax:        286.00 cm/s MV E velocity: 118.00 cm/s                             SHUNTS                             Systemic VTI:  0.04 m                             Systemic Diam: 2.10 cm Neoma Laming MD Electronically signed by Neoma Laming MD Signature Date/Time: 04/19/2020/8:09:07 AM    Final       ASSESSMENT/PLAN    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  Will dc cefepime. continue zithromax only 250 PO.     - prednisone 20 mg po daily and florinef .1 tid      Acute on chronic systolic CHF   - he has 1+ LE edema but he has effusions bilaterally, will try to gently diurese with albumin    Severe protein calorie malnutrition  bitemporal wasting and peripheral muscle wasting.  -Nutrition and PT/OT   Thank you for allowing me to participate in  the care of this patient.   Patient/Family are satisfied with care plan and all questions have been answered.  This document was prepared using Dragon voice recognition software and may include unintentional dictation errors.     Ottie Glazier, M.D.  Division of [place]

## 2020-05-05 NOTE — Progress Notes (Addendum)
PROGRESS NOTE    Jason Fitzgerald  VOZ:[id number] DOB: 10-04-1950 DOA: 04/29/2020 PCP: Steele Sizer, MD   Chief complaint.  Shortness of breath. Brief Narrative:  Jason Fitzgerald is a 70 y.o. male with medical history significant for hypertension, interstitial lung disease, heart failure reduced ejection fraction, hyperlipidemia, history of atrial fibrillation, presents emergency department by EMS for shortness of breath.  Patient condition has been progressively getting worse over the last week, he has a severe short of breath with minimal exertion, he has been sleeping in a recliner, frequent paroxysmal nocturnal dyspnea.  He also drinks alcohol and he used cocaine. Upon arriving the hospital, he was hypotensive.     Assessment & Plan:   Principal Problem:   Shortness of breath Active Problems:   Arteriosclerosis of coronary artery   HTN (hypertension)   Interstitial lung disease (HCC)   Chronic cough   Degenerative arthritis of lumbar spine   Chronic systolic CHF (congestive heart failure) (HCC)   AF (paroxysmal atrial fibrillation) (HCC)   Rheumatoid arthritis involving multiple sites with positive rheumatoid factor (HCC)   Fibrosis, pulmonary, interstitial, diffuse (HCC)   GERD (gastroesophageal reflux disease)   COPD (chronic obstructive pulmonary disease) (HCC)   CHF (congestive heart failure) (HCC)   Anemia   Atrial fibrillation (HCC)   Non compliance w medication regimen   DNR (do not resuscitate)   Prolonged QT interval   Acute on chronic systolic (congestive) heart failure (HCC)   Sepsis ([place])   Aspiration pneumonia of both lower lobes due to gastric secretions (HCC)  Goals of care --Palliative care was involved during last hospitalization and pt reportedly elected to be DNR.  However, family asked for pt to be switched back to Full Code during this hospitalization.  --re-engage palliative care   AKI, not POA --Cr trended up to 2.41 from 0.99 on presentation,  likely due to diuresis while intravascularly depleted.   Plan: --Hold diuresis   #1.  Acute on chronic systolic congestive heart failure. --Patient has evidence of volume overload, significant paroxysmal nocturnal dyspnea, leg edema.  He has intermittent hypotension due to severe LV dysfunction.  Ejection fraction less than 20%. --Patient overall condition appears to be end-stage congestive heart failure.  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 --transition from lasix gtt to oral lasix 20 mg BID, then held due to AKI.   Plan: --Hold diuresis due to worsening AKI and pt currently not hypoxic --continue Farxiga 1m daily  --Hold BB due to hypotension, per pulm --Hold Entrestod/t hypotension.   Paroxysmal atrial fibrillation. --currently rate controlled --Due to pulmonary fibrosis, cannot be placed on amiodarone. 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 Severe hypoalbuminemia. Intermittent hypotension. Coagulopathy secondary to liver failure. Patient also has liver function changes with elevated bilirubin.  This is due to right-sided congestive heart failure. Patient condition is very serious, very high risk of mortality.  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  Severe sepsis. Aspiration pneumonia. Patient met sepsis criteria at time admission, he had tachycardia, tachypnea, hypotension, leukocytosis.  He also has significant lactic acidosis. Due to alcohol drinking, patient has high risk of aspiration pneumonia.  Patient also has Pseudomonas risk due to interstitial lung disease.   --procal elevated --finished 5 days of cefepime  4.  Alcohol abuse. Cocaine abuse. --no  alcohol withdrawal --Continue thiamine and  folic acid.  #6.  Severe protein calorie malnutrition. Poor oral intake and possible swallow issues Patient had significant muscle atrophy, looks malnourished.   --refused all Ensure supplement offered.  Said that he had to be on it for years and got sick of it. Plan: --SLP --cont Dys 2 for now  #7.  Hypokalemia. --monitor and replete PRN  Constipation, resolved --cont Miralax 17g daily  Unstageable ulcers over left shoulder, sacral areas and bilateral buttocks, POA --wound care consulted, recommended surgical consult --Surgery consult on 3/31, does not recommend debridement.  Whole-body pain --avoid narcotics --can't give NSAIDs 2/2 AKI --Tylenol 1g TID PRN --can apply lidocaine patch if pt can localize pain   DVT prophylaxis: lovenox Code Status: Family requested change back to Full code during current hospitalization, wants to continue full treatment Family Communication:  Disposition Plan:   Status is: inpatient   Dispo: The patient is from: Home              Anticipated d/c is to: SNF              Patient currently is not medically stable to d/c.  Severe dyspnea and AKI    Difficult to place patient No    I/O last 3 completed shifts: In: 480 [P.O.:480] Out: 650 [Urine:650] No intake/output data recorded.     Consultants:   Cardiology  Procedures: None  Antimicrobials: Cefepime  Subjective: Pt refused morning labs.  Pt complained of whole-body pain to the RN.  Said that he was eating better.   Objective: Vitals:   05/05/20 0816 05/05/20 0944 05/05/20 1100 05/05/20 1415  BP: 100/81 102/74 100/68 104/68  Pulse: 77 87 85 79  Resp: 16  16   Temp: 98.3 F (36.8 C)  98.7 F (37.1 C)   TempSrc:   Oral   SpO2: 99%  100%   Weight:      Height:        Intake/Output Summary (Last 24 hours) at 05/05/2020 1450 Last data filed at 05/05/2020 0546 Gross per 24 hour  Intake --  Output 650 ml  Net -650 ml   Filed Weights   05/03/20 0348 05/04/20  0650 05/05/20 0500  Weight: 79 kg 78.4 kg 81.7 kg    Examination:  Constitutional: NAD, sleeping but easily arousable, oriented to self and place HEENT: conjunctivae and lids normal, EOMI CV: No cyanosis.   RESP: normal respiratory effort, on RA Extremities: Mild edema in BLE SKIN: warm, dry Neuro: II - XII grossly intact.   Psych: depressed mood and affect.     Data Reviewed: I have personally reviewed following labs and imaging studies  CBC: Recent Labs  Lab 04/29/20 0934 04/30/20 0449 05/01/20 0528 05/02/20 0715 05/03/20 0809 05/04/20 0429  WBC 17.9* 18.7* 10.6* 9.3 10.0 10.5  NEUTROABS 15.4*  --  8.6*  --   --   --   HGB 15.5 13.2 11.5* 10.9* 11.9* 11.3*  HCT 45.5 38.6* 34.3* 31.8* 33.9* 33.0*  MCV 79.8* 80.4 81.3 78.7* 78.1* 78.8*  PLT 149* 127* 115* 150 168 073   Basic Metabolic Panel: Recent Labs  Lab 04/29/20 1420 04/30/20 0449 05/01/20 0528 05/02/20 0715 05/03/20 0809 05/04/20 0429  NA  --  138 134* 133* 133* 133*  K  --  3.4* 3.1* 3.7 3.6 3.7  CL  --  109 94* 96* 97* 96*  CO2  --  _0 GLUCOSE  --  88  108* 118* 102* 113*  BUN  --  27* 32* 39* 44* 50*  CREATININE  --  0.64 0.89 1.25* 1.39* 1.71*  CALCIUM  --  5.6* 7.6* 8.2* 8.4* 8.4*  MG 2.3  --  1.7 2.1 2.1 2.4  PHOS 2.6  --  2.5  --   --   --    GFR: Estimated Creatinine Clearance: 45.4 mL/min (A) (by C-G formula based on SCr of 1.71 mg/dL (H)). Liver Function Tests: Recent Labs  Lab 04/29/20 0934 04/30/20 0449 05/01/20 0528  AST 51* 39 24  ALT 59* 25 25  ALKPHOS 195* 106 107  BILITOT 5.2* 3.0* 2.7*  PROT 9.1* 4.9* 6.0*  ALBUMIN 2.9* 1.6* 2.8*   Recent Labs  Lab 04/29/20 0934  LIPASE 29   No results for input(s): AMMONIA in the last 168 hours. Coagulation Profile: Recent Labs  Lab 04/30/20 0449  INR 1.6*   Cardiac Enzymes: No results for input(s): CKTOTAL, CKMB, CKMBINDEX, TROPONINI in the last 168 hours. BNP (last 3 results) No results for input(s): PROBNP in  the last 8760 hours. HbA1C: No results for input(s): HGBA1C in the last 72 hours. CBG: No results for input(s): GLUCAP in the last 168 hours. Lipid Profile: No results for input(s): CHOL, HDL, LDLCALC, TRIG, CHOLHDL, LDLDIRECT in the last 72 hours. Thyroid Function Tests: No results for input(s): TSH, T4TOTAL, FREET4, T3FREE, THYROIDAB in the last 72 hours. Anemia Panel: No results for input(s): VITAMINB12, FOLATE, FERRITIN, TIBC, IRON, RETICCTPCT in the last 72 hours. Sepsis Labs: Recent Labs  Lab 04/29/20 0934 04/29/20 1043 04/29/20 1220 04/30/20 0449 04/30/20 1242 05/01/20 0528  PROCALCITON  --  3.66  --  2.87  --  3.97  LATICACIDVEN 2.4*  --  3.2*  --  2.7*  --     Recent Results (from the past 240 hour(s))  Culture, blood (routine x 2)     Status: None   Collection Time: 04/29/20  9:34 AM   Specimen: BLOOD  Result Value Ref Range Status   Specimen Description BLOOD RIGHT ARM  Final   Special Requests   Final    BOTTLES DRAWN AEROBIC AND ANAEROBIC Blood Culture adequate volume   Culture   Final    NO GROWTH 5 DAYS Performed at [facility], [address] [place], [place] [address]    Report Status 05/04/2020 FINAL  Final  Culture, blood (routine x 2)     Status: None   Collection Time: 04/29/20  9:34 AM   Specimen: BLOOD  Result Value Ref Range Status   Specimen Description BLOOD BLOOD LEFT ARM  Final   Special Requests   Final    BOTTLES DRAWN AEROBIC AND ANAEROBIC Blood Culture adequate volume   Culture   Final    NO GROWTH 5 DAYS Performed at [facility], [address] [place], [place] [address]    Report Status 05/04/2020 FINAL  Final  SARS CORONAVIRUS 2 (TAT 6-24 HRS) Nasopharyngeal Nasopharyngeal Swab     Status: None   Collection Time: 04/29/20  9:40 AM   Specimen: Nasopharyngeal Swab  Result Value Ref Range Status   SARS Coronavirus 2 NEGATIVE NEGATIVE Final    Comment: (NOTE) SARS-CoV-2 target nucleic acids are NOT  DETECTED.  The SARS-CoV-2 RNA is generally detectable in upper and lower respiratory specimens during the acute phase of infection. Negative results do not preclude SARS-CoV-2 infection, do not rule out co-infections with other pathogens, and should not be used as the sole   basis for treatment or other patient management decisions. Negative results must be combined with clinical observations, patient history, and epidemiological information. The expected result is Negative.  Fact Sheet for Patients: https://www.fda.gov/media/138098/download  Fact Sheet for Healthcare Providers: https://www.fda.gov/media/138095/download  This test is not yet approved or cleared by the [place] FDA and  has been authorized for detection and/or diagnosis of SARS-CoV-2 by FDA under an Emergency Use Authorization (EUA). This EUA will remain  in effect (meaning this test can be used) for the duration of the COVID-19 declaration under Se ction 564(b)(1) of the Act, 21 U.S.C. section 360bbb-3(b)(1), unless the authorization is terminated or revoked sooner.  Performed at [facility], [address] [place], [place] [address]          Radiology Studies: No results found.      Scheduled Meds: . atorvastatin  40 mg Oral Daily  . calcium carbonate  400 mg of elemental calcium Oral TID WC  . collagenase   Topical Daily  . dapagliflozin propanediol  10 mg Oral Daily  . enoxaparin (LOVENOX) injection  40 mg Subcutaneous Q24H  . feeding supplement  237 mL Oral BID BM  . fludrocortisone  0.1 mg Oral Daily  . folic acid  1 mg Oral Daily  . midodrine  10 mg Oral TID WC  . multivitamin with minerals  1 tablet Oral Daily  . polyethylene glycol  17 g Oral Daily  . predniSONE  20 mg Oral Q breakfast  . thiamine  100 mg Oral Daily   Or  . thiamine  100 mg Intravenous Daily   Continuous Infusions:    LOS: 5 days     Tina Lai, MD [facility]   To contact the attending  provider between 7A-7P or the covering provider during after hours 7P-7A, please log into the web site [email or web address] and access using universal [place] password for that web site. If you do not have the password, please call the hospital operator.  05/05/2020, 2:50 PM  

## 2020-05-06 DIAGNOSIS — R0602 Shortness of breath: Secondary | ICD-10-CM | POA: Diagnosis not present

## 2020-05-06 DIAGNOSIS — Z7189 Other specified counseling: Secondary | ICD-10-CM | POA: Diagnosis not present

## 2020-05-06 DIAGNOSIS — Z515 Encounter for palliative care: Secondary | ICD-10-CM | POA: Diagnosis not present

## 2020-05-06 LAB — CBC
HCT: 32.9 % — ABNORMAL LOW (ref 39.0–52.0)
Hemoglobin: 11.4 g/dL — ABNORMAL LOW (ref 13.0–17.0)
MCH: 27 pg (ref 26.0–34.0)
MCHC: 34.7 g/dL (ref 30.0–36.0)
MCV: 77.8 fL — ABNORMAL LOW (ref 80.0–100.0)
Platelets: 221 10*3/uL (ref 150–400)
RBC: 4.23 MIL/uL (ref 4.22–5.81)
RDW: 15.4 % (ref 11.5–15.5)
WBC: 10.5 10*3/uL (ref 4.0–10.5)
nRBC: 0.6 % — ABNORMAL HIGH (ref 0.0–0.2)

## 2020-05-06 LAB — BASIC METABOLIC PANEL
Anion gap: 12 (ref 5–15)
BUN: 72 mg/dL — ABNORMAL HIGH (ref 8–23)
CO2: 25 mmol/L (ref 22–32)
Calcium: 8.7 mg/dL — ABNORMAL LOW (ref 8.9–10.3)
Chloride: 99 mmol/L (ref 98–111)
Creatinine, Ser: 2.73 mg/dL — ABNORMAL HIGH (ref 0.61–1.24)
GFR, Estimated: 25 mL/min — ABNORMAL LOW (ref >60)
Glucose, Bld: 115 mg/dL — ABNORMAL HIGH (ref 70–99)
Potassium: 3.7 mmol/L (ref 3.5–5.1)
Sodium: 136 mmol/L (ref 135–145)

## 2020-05-06 LAB — MAGNESIUM: Magnesium: 2.7 mg/dL — ABNORMAL HIGH (ref 1.7–2.4)

## 2020-05-06 MED ORDER — ENOXAPARIN SODIUM 30 MG/0.3ML ~~LOC~~ SOLN
30.0000 mg | SUBCUTANEOUS | Status: DC
  Administered 2020-05-07 – 2020-05-10 (×4): 30 mg via SUBCUTANEOUS
  Filled 2020-05-06 (×4): qty 0.3

## 2020-05-06 MED ORDER — NEPRO/CARBSTEADY PO LIQD
237.0000 mL | Freq: Two times a day (BID) | ORAL | Status: DC
  Administered 2020-05-12: 237 mL via ORAL

## 2020-05-06 MED ORDER — PREDNISONE 10 MG PO TABS
10.0000 mg | ORAL_TABLET | Freq: Every day | ORAL | Status: DC
  Administered 2020-05-07 – 2020-05-13 (×6): 10 mg via ORAL
  Filled 2020-05-06 (×6): qty 1

## 2020-05-06 NOTE — Progress Notes (Signed)
   05/06/20 1843  Vitals  Temp 97.7 F (36.5 C)  Temp Source Axillary  BP 100/72  MAP (mmHg) 81  BP Location Right Arm  BP Method Automatic  Patient Position (if appropriate) Lying  Pulse Rate 88  Pulse Rate Source Monitor  Resp (!) 22  Level of Consciousness  Level of Consciousness Alert  Oxygen Therapy  SpO2 100 %  O2 Device Room Air  Pain Assessment  Pain Scale 0-10  Pain Score 0   Pt remains intermittently confused/inappropriate communications. Patient alert, but lethargic/sleepy. Following commands. Daughter at bedside. Patient denies further needs at this time.

## 2020-05-06 NOTE — Progress Notes (Signed)
SUBJECTIVE: Patient resting in bed. Was attempting to work with PT when assessed. Patient states his breathing has improved and continues to deny chest pain. Main complaint is weakness and generalized pain.   Vitals:   05/05/20 2000 05/06/20 0412 05/06/20 0717 05/06/20 0935  BP: 107/78 113/83 102/75 108/69  Pulse: 89 88 81 89  Resp: 17 20 18    Temp: (!) 97.5 F (36.4 C) (!) 97.3 F (36.3 C) 97.6 F (36.4 C)   TempSrc: Oral Oral Axillary   SpO2: 99% 100% 100%   Weight:  78.6 kg    Height:        Intake/Output Summary (Last 24 hours) at 05/06/2020 1025 Last data filed at 05/05/2020 2000 Gross per 24 hour  Intake 120 ml  Output --  Net 120 ml    LABS: Basic Metabolic Panel: Recent Labs    05/05/20 2037 05/06/20 0843  NA 134* 136  K 4.5 3.7  CL 97* 99  CO2 21* 25  GLUCOSE 131* 115*  BUN 65* 72*  CREATININE 2.41* 2.73*  CALCIUM 8.5* 8.7*  MG 2.6* 2.7*   Liver Function Tests: No results for input(s): AST, ALT, ALKPHOS, BILITOT, PROT, ALBUMIN in the last 72 hours. No results for input(s): LIPASE, AMYLASE in the last 72 hours. CBC: Recent Labs    05/05/20 2037 05/06/20 0728  WBC 9.1 10.5  HGB 12.0* 11.4*  HCT 35.2* 32.9*  MCV 78.7* 77.8*  PLT 196 221   Cardiac Enzymes: No results for input(s): CKTOTAL, CKMB, CKMBINDEX, TROPONINI in the last 72 hours. BNP: Invalid input(s): POCBNP D-Dimer: No results for input(s): DDIMER in the last 72 hours. Hemoglobin A1C: No results for input(s): HGBA1C in the last 72 hours. Fasting Lipid Panel: No results for input(s): CHOL, HDL, LDLCALC, TRIG, CHOLHDL, LDLDIRECT in the last 72 hours. Thyroid Function Tests: No results for input(s): TSH, T4TOTAL, T3FREE, THYROIDAB in the last 72 hours.  Invalid input(s): FREET3 Anemia Panel: No results for input(s): VITAMINB12, FOLATE, FERRITIN, TIBC, IRON, RETICCTPCT in the last 72 hours.   PHYSICAL EXAM General: Well developed, well nourished, in no acute distress HEENT:   Normocephalic and atramatic Neck:  No JVD.  Lungs: Bibasilar fine crackles indicative of ILD. Heart: HRRR . Normal S1 and S2 without gallops or murmurs.  Abdomen: Bowel sounds are positive, abdomen soft and non-tender  Msk:  Back normal, normal gait. Normal strength and tone for age. Extremities: No clubbing, cyanosis or edema.   Neuro: Alert and oriented X 3. Slow to answer Psych:  Good affect, responds appropriately  TELEMETRY: NSR 80/bpm  ASSESSMENT AND PLAN: Patient presenting to the emergency department with worsening dyspnea.Currently in sinus rhythm and rate controlled, continue to hold BB.Respiratory status improved, now on RA. Pulmonology following for pulmonary fibrosis management. Patient continues to have worsening kidney function. With HFrEF can only tolerateFarxiga 10mg  for now. With continued hypotension / AKI continue to holdEntrestoand metoprolol. Patient with end stage HFrEF and worsening ILD, continued recommendation for palliative measures.Will continue to follow.  Principal Problem:   Shortness of breath Active Problems:   Arteriosclerosis of coronary artery   HTN (hypertension)   Interstitial lung disease (HCC)   Chronic cough   Degenerative arthritis of lumbar spine   Chronic systolic CHF (congestive heart failure) (HCC)   AF (paroxysmal atrial fibrillation) (HCC)   Rheumatoid arthritis involving multiple sites with positive rheumatoid factor (HCC)   Fibrosis, pulmonary, interstitial, diffuse (HCC)   GERD (gastroesophageal reflux disease)   COPD (chronic obstructive pulmonary disease) (  [place])   CHF (congestive heart failure) ([place])   Anemia   Atrial fibrillation (HCC)   Non compliance w medication regimen   DNR (do not resuscitate)   Prolonged QT interval   Acute on chronic systolic (congestive) heart failure ([place])   Sepsis ([place])   Aspiration pneumonia of both lower lobes due to gastric secretions ([place])    Adaline Sill, NP-C 05/06/2020 10:25  AM

## 2020-05-06 NOTE — Progress Notes (Signed)
PROGRESS NOTE    Jason Fitzgerald  GBT:[id number] DOB: October 01, 1950 DOA: 04/29/2020 PCP: Steele Sizer, MD   Chief complaint.  Shortness of breath. Brief Narrative:  Jason Fitzgerald is a 70 y.o. male with medical history significant for hypertension, interstitial lung disease, heart failure reduced ejection fraction, hyperlipidemia, history of atrial fibrillation, presents emergency department by EMS for shortness of breath.  Patient condition has been progressively getting worse over the last week, he has a severe short of breath with minimal exertion, he has been sleeping in a recliner, frequent paroxysmal nocturnal dyspnea.  He also drinks alcohol and he used cocaine. Upon arriving the hospital, he was hypotensive.     Assessment & Plan:   Principal Problem:   Shortness of breath Active Problems:   Arteriosclerosis of coronary artery   HTN (hypertension)   Interstitial lung disease (HCC)   Chronic cough   Degenerative arthritis of lumbar spine   Chronic systolic CHF (congestive heart failure) (HCC)   AF (paroxysmal atrial fibrillation) (HCC)   Rheumatoid arthritis involving multiple sites with positive rheumatoid factor (HCC)   Fibrosis, pulmonary, interstitial, diffuse (HCC)   GERD (gastroesophageal reflux disease)   COPD (chronic obstructive pulmonary disease) (HCC)   CHF (congestive heart failure) (HCC)   Anemia   Atrial fibrillation (HCC)   Non compliance w medication regimen   DNR (do not resuscitate)   Prolonged QT interval   Acute on chronic systolic (congestive) heart failure (HCC)   Sepsis ([place])   Aspiration pneumonia of both lower lobes due to gastric secretions (HCC)  Goals of care --Palliative care was involved during last hospitalization and pt reportedly elected to be DNR.  However, family asked for pt to be switched back to Full Code during this hospitalization.  --re-engage palliative care   AKI, not POA --Cr 0.99 on presentation, trending up to 2.73 this  morning. --likely due to diuresis while intravascularly depleted, hypotension Plan: --Hold diuresis and [place] --nephrology consult today --cont midodrine and avoid hypotension  #1.  Acute on chronic systolic congestive heart failure. --Patient has evidence of volume overload, significant paroxysmal nocturnal dyspnea, leg edema.  He has intermittent hypotension due to severe LV dysfunction.  Ejection fraction less than 20%. --Patient overall condition appears to be end-stage congestive heart failure.  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 --transition from lasix gtt to oral lasix 20 mg BID, then held due to AKI.   Plan: --Hold diuresis due to worsening AKI and pt currently not hypoxic --Hold Farxiga 2/2 AKI --Hold BB due to hypotension, per pulm --Hold Entrestod/t hypotension.   Paroxysmal atrial fibrillation. --currently rate controlled --Due to pulmonary fibrosis, cannot be placed on amiodarone. 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 Severe hypoalbuminemia. Intermittent hypotension. Coagulopathy secondary to liver failure. Patient also has liver function changes with elevated bilirubin.  This is due to right-sided congestive heart failure. Patient condition is very serious, very high risk of mortality.  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  Severe sepsis. Aspiration pneumonia. Patient met sepsis criteria at time admission, he had tachycardia, tachypnea, hypotension, leukocytosis.  He also has significant lactic acidosis. Due to alcohol drinking, patient has high risk of aspiration pneumonia.  Patient also has Pseudomonas risk due to interstitial lung disease.    --  procal elevated --finished 5 days of cefepime  4.  Alcohol abuse. Cocaine abuse. --no alcohol withdrawal --Continue thiamine and folic acid.  #6.  Severe protein calorie malnutrition. Poor oral intake and possible swallow issues Patient had significant muscle atrophy, looks malnourished.   --refused all Ensure supplement offered.  Said that he had to be on it for years and got sick of it. Plan: --SLP, rec Dys 1  #7.  Hypokalemia. --monitor and replete PRN  Constipation, resolved --cont Miralax 17g daily  Unstageable ulcers over left shoulder, sacral areas and bilateral buttocks, POA --wound care consulted, recommended surgical consult --Surgery consult on 3/31, does not recommend debridement.  Whole-body pain --avoid narcotics --can't give NSAIDs 2/2 AKI --Tylenol 1g TID PRN --can apply lidocaine patch if pt can localize pain   DVT prophylaxis: lovenox Code Status: Family requested change back to Full code during current hospitalization, wants to continue full treatment Family Communication: Palliative care provider spoke to family today Disposition Plan:   Status is: inpatient   Dispo: The patient is from: Home              Anticipated d/c is to: SNF               Patient currently is not medically stable to d/c.  Severe dyspnea and AKI    Difficult to place patient No    I/O last 3 completed shifts: In: 120 [P.O.:120] Out: 650 [Urine:650] No intake/output data recorded.     Consultants:   Cardiology  Procedures: None  Antimicrobials: Cefepime  Subjective: Pt was looking more comfortable and animated this morning, but started complaining of dyspnea and chest tightness again later afternoon.  HDS, sating well.   Objective: Vitals:   05/06/20 0412 05/06/20 0717 05/06/20 0935 05/06/20 1155  BP: 113/83 102/75 108/69 101/66  Pulse: 88 81 89 81  Resp: 20 18  18   Temp: (!) 97.3 F (36.3 C) 97.6 F (36.4 C)  97.7 F (36.5 C)  TempSrc: Oral  Axillary    SpO2: 100% 100%  90%  Weight: 78.6 kg     Height:        Intake/Output Summary (Last 24 hours) at 05/06/2020 1619 Last data filed at 05/05/2020 2000 Gross per 24 hour  Intake 120 ml  Output --  Net 120 ml   Filed Weights   05/04/20 0650 05/05/20 0500 05/06/20 0412  Weight: 78.4 kg 81.7 kg 78.6 kg    Examination:  Constitutional: NAD, alert, oriented to self and place, more talkative today HEENT: conjunctivae and lids normal, EOMI CV: No cyanosis.   RESP: normal respiratory effort, on RA Extremities: Mild edema in BLE SKIN: warm, dry Neuro: II - XII grossly intact.     Data Reviewed: I have personally reviewed following labs and imaging studies  CBC: Recent Labs  Lab 05/01/20 0528 05/02/20 0715 05/03/20 0809 05/04/20 0429 05/05/20 2037 05/06/20 0728  WBC 10.6* 9.3 10.0 10.5 9.1 10.5  NEUTROABS 8.6*  --   --   --   --   --   HGB 11.5* 10.9* 11.9* 11.3* 12.0* 11.4*  HCT 34.3* 31.8* 33.9* 33.0* 35.2* 32.9*  MCV 81.3 78.7* 78.1* 78.8* 78.7* 77.8*  PLT 115* 150 168 172 196 606   Basic Metabolic Panel: Recent Labs  Lab 05/01/20 0528 05/02/20 0715 05/03/20 0809 05/04/20 0429 05/05/20 2037 05/06/20 0843  NA 134* 133* 133* 133* 134* 136  K 3.1* 3.7 3.6 3.7 4.5 3.7  CL 94* 96* 97* 96* 97*  99  CO2 26 23 22 24  21* 25  GLUCOSE 108* 118* 102* 113* 131* 115*  BUN 32* 39* 44* 50* 65* 72*  CREATININE 0.89 1.25* 1.39* 1.71* 2.41* 2.73*  CALCIUM 7.6* 8.2* 8.4* 8.4* 8.5* 8.7*  MG 1.7 2.1 2.1 2.4 2.6* 2.7*  PHOS 2.5  --   --   --   --   --    GFR: Estimated Creatinine Clearance: 28.4 mL/min (A) (by C-G formula based on SCr of 2.73 mg/dL (H)). Liver Function Tests: Recent Labs  Lab 04/30/20 0449 05/01/20 0528  AST 39 24  ALT 25 25  ALKPHOS 106 107  BILITOT 3.0* 2.7*  PROT 4.9* 6.0*  ALBUMIN 1.6* 2.8*   No results for input(s): LIPASE, AMYLASE in the last 168 hours. No results for input(s): AMMONIA in the last 168 hours. Coagulation Profile: Recent  Labs  Lab 04/30/20 0449  INR 1.6*   Cardiac Enzymes: No results for input(s): CKTOTAL, CKMB, CKMBINDEX, TROPONINI in the last 168 hours. BNP (last 3 results) No results for input(s): PROBNP in the last 8760 hours. HbA1C: No results for input(s): HGBA1C in the last 72 hours. CBG: No results for input(s): GLUCAP in the last 168 hours. Lipid Profile: No results for input(s): CHOL, HDL, LDLCALC, TRIG, CHOLHDL, LDLDIRECT in the last 72 hours. Thyroid Function Tests: No results for input(s): TSH, T4TOTAL, FREET4, T3FREE, THYROIDAB in the last 72 hours. Anemia Panel: No results for input(s): VITAMINB12, FOLATE, FERRITIN, TIBC, IRON, RETICCTPCT in the last 72 hours. Sepsis Labs: Recent Labs  Lab 04/30/20 0449 04/30/20 1242 05/01/20 0528  PROCALCITON 2.87  --  3.97  LATICACIDVEN  --  2.7*  --     Recent Results (from the past 240 hour(s))  Culture, blood (routine x 2)     Status: None   Collection Time: 04/29/20  9:34 AM   Specimen: BLOOD  Result Value Ref Range Status   Specimen Description BLOOD RIGHT ARM  Final   Special Requests   Final    BOTTLES DRAWN AEROBIC AND ANAEROBIC Blood Culture adequate volume   Culture   Final    NO GROWTH 5 DAYS Performed at [facility], [address] [place], [place] [address]    Report Status 05/04/2020 FINAL  Final  Culture, blood (routine x 2)     Status: None   Collection Time: 04/29/20  9:34 AM   Specimen: BLOOD  Result Value Ref Range Status   Specimen Description BLOOD BLOOD LEFT ARM  Final   Special Requests   Final    BOTTLES DRAWN AEROBIC AND ANAEROBIC Blood Culture adequate volume   Culture   Final    NO GROWTH 5 DAYS Performed at [facility], [address], [place], [place] [address]    Report Status 05/04/2020 FINAL  Final  SARS CORONAVIRUS 2 (TAT 6-24 HRS) Nasopharyngeal Nasopharyngeal Swab     Status: None   Collection Time: 04/29/20  9:40 AM   Specimen: Nasopharyngeal Swab  Result Value Ref  Range Status   SARS Coronavirus 2 NEGATIVE NEGATIVE Final    Comment: (NOTE) SARS-CoV-2 target nucleic acids are NOT DETECTED.  The SARS-CoV-2 RNA is generally detectable in upper and lower respiratory specimens during the acute phase of infection. Negative results do not preclude SARS-CoV-2 infection, do not rule out co-infections with other pathogens, and should not be used as the sole basis for treatment or other patient management decisions. Negative results must be combined with clinical observations, patient history, and  epidemiological information. The expected result is Negative.  Fact Sheet for Patients: SugarRoll.be  Fact Sheet for Healthcare Providers: https://www.woods-mathews.com/  This test is not yet approved or cleared by the [place] FDA and  has been authorized for detection and/or diagnosis of SARS-CoV-2 by FDA under an Emergency Use Authorization (EUA). This EUA will remain  in effect (meaning this test can be used) for the duration of the COVID-19 declaration under Se ction 564(b)(1) of the Act, 21 U.S.C. section 360bbb-3(b)(1), unless the authorization is terminated or revoked sooner.  Performed at [facility], [place] [address] [place], [place] [address]          Radiology Studies: No results found.      Scheduled Meds: . atorvastatin  40 mg Oral Daily  . calcium carbonate  400 mg of elemental calcium Oral TID WC  . collagenase   Topical Daily  . [START ON 05/07/2020] enoxaparin (LOVENOX) injection  30 mg Subcutaneous Q24H  . feeding supplement (NEPRO CARB STEADY)  237 mL Oral BID BM  . fludrocortisone  0.1 mg Oral Daily  . folic acid  1 mg Oral Daily  . midodrine  10 mg Oral TID WC  . multivitamin with minerals  1 tablet Oral Daily  . polyethylene glycol  17 g Oral Daily  . [START ON 05/07/2020] predniSONE  10 mg Oral Q breakfast  . thiamine  100 mg Oral Daily   Or  . thiamine  100 mg  Intravenous Daily   Continuous Infusions:    LOS: 6 days     Enzo Bi, MD [facility]   To contact the attending provider between 7A-7P or the covering provider during after hours 7P-7A, please log into the web site [email or web address] and access using universal [place] password for that web site. If you do not have the password, please call the hospital operator.  05/06/2020, 4:19 PM

## 2020-05-06 NOTE — Consult Note (Signed)
[facility] Consult Note: 05/06/2020    Date of Admission:  04/29/2020           Reason for Consult:  Acute kidney injury   Referring Provider: Enzo Bi, MD Primary Care Provider: Steele Sizer, MD   History of Presenting Illness:  Jason Fitzgerald is a 70 y.o. male with extensive medical problems He originally presented to the emergency room on -20 eighth by EMS for pain all over and constipation.  He had a low-grade temperature and had atrial fibrillation. Creatinine at admission on March 29 was 0.64. Since then creatinine has been steadily increasing and is up to 2.73 today. Nephrology consult has been requested for evaluation   Review of flow sheets on March 28-29 shows hypotension with blood pressure in the 93Z systolic which lasted until March 31.  Since April 1, blood pressure has been in the low 100s Review of medication administration shows patient has received IV albumin, azithromycin, cefepime, ceftriaxone, CT abdomen pelvis with contrast on March 28, Farxiga started on March 28, doxycycline, furosemide IV infusion on March 29, March 30 with IV furosemide bolus, Ativan, metoprolol, morphine, potassium packets, prednisone, Xarelto, vancomycin on March 28   Review of Systems: ROS   Patient is a somewhat lethargic and confused.  Review of system is very limited. He denies any acute shortness of breath.  Denies any blood in the stool or urine.  No cough or hemoptysis.  No shortness of breath at present but patient does not feel like he is back to his baseline.  No leg edema.  Past Medical History:  Diagnosis Date  . CHF (congestive heart failure) ([place])   . Chronic cough   . COPD (chronic obstructive pulmonary disease) ([place])   . Elevated liver function tests   . Emphysema of lung ([place])   . Fibrosis, pulmonary, interstitial, diffuse ([place])   . GERD (gastroesophageal reflux disease)   . History of cocaine abuse ([place])   . Mediastinal lymphadenopathy    . Pulmonary fibrosis ([place])   . Right inguinal hernia     Social History   Tobacco Use  . Smoking status: Never Smoker  . Smokeless tobacco: Never Used  Vaping Use  . Vaping Use: Never used  Substance Use Topics  . Alcohol use: Not Currently    Alcohol/week: 0.0 standard drinks    Comment: 2 quarts a week, he quit again 12/2018  . Drug use: Not Currently    Types: Cocaine    Comment: as an young adult     Family History  Problem Relation Age of Onset  . Diabetes Mother   . Cancer Father   . AAA (abdominal aortic aneurysm) Brother      OBJECTIVE: Blood pressure 101/66, pulse 81, temperature 97.7 F (36.5 C), resp. rate 18, height 6' (1.829 m), weight 78.6 kg, SpO2 90 %.    Physical Exam: General:  No acute distress, laying in the bed  HEENT  anicteric, moist oral mucous membrane  Pulm/lungs  normal breathing effort, lungs are clear to auscultation  CVS/Heart  irregular rhythm, no rub or gallop  Abdomen:   Soft, nontender  Extremities:  No peripheral edema  Neurologic:  Alert, oriented to self and place, able to follow commands and answers simple questions  Skin:  No acute rashes  Foley catheter present   Lab Results Lab Results  Component Value Date   WBC 10.5 05/06/2020   HGB 11.4 (L) 05/06/2020   HCT 32.9 (L) 05/06/2020  MCV 77.8 (L) 05/06/2020   PLT 221 05/06/2020    Lab Results  Component Value Date   CREATININE 2.73 (H) 05/06/2020   BUN 72 (H) 05/06/2020   NA 136 05/06/2020   K 3.7 05/06/2020   CL 99 05/06/2020   CO2 25 05/06/2020    Lab Results  Component Value Date   ALT 25 05/01/2020   AST 24 05/01/2020   GGT 318 (H) 12/17/2018   ALKPHOS 107 05/01/2020   BILITOT 2.7 (H) 05/01/2020     Microbiology: Recent Results (from the past 240 hour(s))  Culture, blood (routine x 2)     Status: None   Collection Time: 04/29/20  9:34 AM   Specimen: BLOOD  Result Value Ref Range Status   Specimen Description BLOOD RIGHT ARM  Final   Special  Requests   Final    BOTTLES DRAWN AEROBIC AND ANAEROBIC Blood Culture adequate volume   Culture   Final    NO GROWTH 5 DAYS Performed at [facility], [address] [place], [place] [address]    Report Status 05/04/2020 FINAL  Final  Culture, blood (routine x 2)     Status: None   Collection Time: 04/29/20  9:34 AM   Specimen: BLOOD  Result Value Ref Range Status   Specimen Description BLOOD BLOOD LEFT ARM  Final   Special Requests   Final    BOTTLES DRAWN AEROBIC AND ANAEROBIC Blood Culture adequate volume   Culture   Final    NO GROWTH 5 DAYS Performed at [facility], [address] [place], [place] [address]    Report Status 05/04/2020 FINAL  Final  SARS CORONAVIRUS 2 (TAT 6-24 HRS) Nasopharyngeal Nasopharyngeal Swab     Status: None   Collection Time: 04/29/20  9:40 AM   Specimen: Nasopharyngeal Swab  Result Value Ref Range Status   SARS Coronavirus 2 NEGATIVE NEGATIVE Final    Comment: (NOTE) SARS-CoV-2 target nucleic acids are NOT DETECTED.  The SARS-CoV-2 RNA is generally detectable in upper and lower respiratory specimens during the acute phase of infection. Negative results do not preclude SARS-CoV-2 infection, do not rule out co-infections with other pathogens, and should not be used as the sole basis for treatment or other patient management decisions. Negative results must be combined with clinical observations, patient history, and epidemiological information. The expected result is Negative.  Fact Sheet for Patients: SugarRoll.be  Fact Sheet for Healthcare Providers: https://www.woods-mathews.com/  This test is not yet approved or cleared by the [place] FDA and  has been authorized for detection and/or diagnosis of SARS-CoV-2 by FDA under an Emergency Use Authorization (EUA). This EUA will remain  in effect (meaning this test can be used) for the duration of the COVID-19 declaration under  Se ction 564(b)(1) of the Act, 21 U.S.C. section 360bbb-3(b)(1), unless the authorization is terminated or revoked sooner.  Performed at [facility], [place] [address], [place], [place] [address]     Medications: Scheduled Meds: . atorvastatin  40 mg Oral Daily  . calcium carbonate  400 mg of elemental calcium Oral TID WC  . collagenase   Topical Daily  . [START ON 05/07/2020] enoxaparin (LOVENOX) injection  30 mg Subcutaneous Q24H  . feeding supplement (NEPRO CARB STEADY)  237 mL Oral BID BM  . fludrocortisone  0.1 mg Oral Daily  . folic acid  1 mg Oral Daily  . midodrine  10 mg Oral TID WC  . multivitamin with minerals  1  tablet Oral Daily  . polyethylene glycol  17 g Oral Daily  . [START ON 05/07/2020] predniSONE  10 mg Oral Q breakfast  . thiamine  100 mg Oral Daily   Or  . thiamine  100 mg Intravenous Daily   Continuous Infusions: PRN Meds:.acetaminophen, ondansetron **OR** ondansetron (ZOFRAN) IV  No Known Allergies  Urinalysis: No results for input(s): COLORURINE, LABSPEC, PHURINE, GLUCOSEU, HGBUR, BILIRUBINUR, KETONESUR, PROTEINUR, UROBILINOGEN, NITRITE, LEUKOCYTESUR in the last 72 hours.  Invalid input(s): APPERANCEUR    Imaging: No results found.    Assessment/Plan:  Jason Fitzgerald is a 70 y.o. male with medical problems of interstitial lung disease, hypertension, heart failure with reduced EF, hyperlipidemia, atrial fibrillation  was admitted on 04/29/2020 for :  Shortness of breath [R06.02] SOB (shortness of breath) [R06.02] Acute on chronic systolic congestive heart failure (HCC) [I50.23] Atrial fibrillation with RVR ([place]) [I48.91] Sepsis without acute organ dysfunction, due to unspecified organism [facility]) [A41.9] Acute on chronic systolic (congestive) heart failure ([place]) [I50.23]  #Acute kidney injury Baseline creatinine of 0.64 on March 29 AKI risk factors include IV contrast exposure on March 28, IV vancomycin, multiple antibiotics, IV furosemide  infusion and boluses in the setting of hypotension, Farxiga  At present serum creatinine trend is worsening Lab Results  Component Value Date   CREATININE 2.73 (H) 05/06/2020   CREATININE 2.41 (H) 05/05/2020   CREATININE 1.71 (H) 05/04/2020   04/03 0701 - 04/04 0700 In: 120 [P.O.:120] Out: -   Hold nephrotoxic medications Hold further diuresis, Farxiga. Avoid IV contrast exposure Avoid hypotension, agree with midodrine  Supportive care for now Electrolytes and Volume status are acceptable No acute indication for Dialysis at present    Sharisse Rantz 05/06/20

## 2020-05-06 NOTE — Evaluation (Signed)
Clinical/Bedside Swallow Evaluation Patient Details  Name: BENTLEY FISSEL MRN: [id number] Date of Birth: December 01, 1950  Today's Date: 05/06/2020 Time: SLP Start Time (ACUTE ONLY): 1010 SLP Stop Time (ACUTE ONLY): 1110 SLP Time Calculation (min) (ACUTE ONLY): 60 min  Past Medical History:  Past Medical History:  Diagnosis Date  . CHF (congestive heart failure) ([place])   . Chronic cough   . COPD (chronic obstructive pulmonary disease) ([place])   . Elevated liver function tests   . Emphysema of lung ([place])   . Fibrosis, pulmonary, interstitial, diffuse ([place])   . GERD (gastroesophageal reflux disease)   . History of cocaine abuse ([place])   . Mediastinal lymphadenopathy   . Pulmonary fibrosis ([place])   . Right inguinal hernia    Past Surgical History:  Past Surgical History:  Procedure Laterality Date  . COLONOSCOPY WITH PROPOFOL N/A 08/19/2018   Procedure: COLONOSCOPY WITH PROPOFOL;  Surgeon: Virgel Manifold, MD;  Location: [facility] ENDOSCOPY;  Service: Endoscopy;  Laterality: N/A;  . HERNIA REPAIR Right 1973   open  . INGUINAL HERNIA REPAIR Right 11/08/2014   Procedure: LAPAROSCOPIC RIGHT INGUINAL HERNIA REPAIR;  Surgeon: Hubbard Robinson, MD;  Location: [facility] ORS;  Service: General;  Laterality: Right;  . knee arthroscopy Right 1972  . RIGHT/LEFT HEART CATH AND CORONARY ANGIOGRAPHY Right 03/05/2017   Procedure: RIGHT/LEFT HEART CATH AND CORONARY ANGIOGRAPHY;  Surgeon: Dionisio David, MD;  Location: [place] [facility];  Service: Cardiovascular;  Laterality: Right;   HPI:  Per admitting H&P "MANSUR PATTI is a 70 y.o. male with medical history significant for hypertension, interstitial lung disease, heart failure reduced ejection fraction, hyperlipidemia, history of atrial fibrillation, presents emergency department by EMS for shortness of breath.     Mr. Tissue states that his shortness of breath is worse with exertion and laying flat.  He reports that shortness of breath started this morning.   He denies fever, chills, nausea, vomiting, diarrhea, chest pain.  He does endorse coughing of white phlegm.  Denies weight gain.  He denies changes to diet. He states this feels like his previous episodes of heart failure exacerbation.     He states to me that he has not had his medication for 1 to 2 weeks.  He states he ran out.  He does endorse to me that he drinks alcohol daily and last week and he had a party/guess over.  "GILMAN OLAZABAL is a 70 y.o. male with medical history significant for hypertension, interstitial lung disease, heart failure reduced ejection fraction, hyperlipidemia, history of atrial fibrillation, presents emergency department by EMS for shortness of breath.     Mr. Rybacki states that his shortness of breath is worse with exertion and laying flat.  He reports that shortness of breath started this morning.  He denies fever, chills, nausea, vomiting, diarrhea, chest pain.  He does endorse coughing of white phlegm.  Denies weight gain.  He denies changes to diet. He states this feels like his previous episodes of heart failure exacerbation.     He states to me that he has not had his medication for 1 to 2 weeks.  He states he ran out.  He does endorse to me that he drinks alcohol daily and last week and he had a party/guess over."   Assessment / Plan / Recommendation Clinical Impression  Pt presents with mild to moderate dysphagia but no immediate s/s of aspiration with tested consistencies. Upon entering the room, Pt was noted to be coughing  up large amounts of thick clear mucous/sputum. Oral mech exam revealed teeth to be in poor condition, painful lesion on lower lip and reports of "teeth hurting" (gums around teeth). Pt needed max encouragement to try PO's with ST and refused to allow ST to assist in placing the food in his mouth at the beginning of the session. Once trust was gained, he allowed ST to scoop the food onto the spoon while he very slowly took it to his mouth. Pt  tolerated thin liquids by straw without immediate s/s of aspiration but did often present with delayed coughing which could indicate reflux and/or aspiration after the swallow from residue or could just be his chronic cough. No s/s of aspiration were noted with magic cup which he seemed to enjoy. Again, ST had to assist with each bite. No solids given as Pt reproted he was afraid it would hurt to chew. He agreed to try a pureed diet for lunch today and requested the magic cup at meals as well. Pt was given a few sips of nectar thick liquid but reported he did not like it. At this time, rec Dys 1 (pureed) diet with thin liquids. Encouraged Pt to take small, single sips of the thin liquids and to take meds crushed in applesauce. Pt has had a barium swallow in the past in which he did not tolerate well because of chronic coughing. If coughing of thick sputum persist, MD may want to consider GI consult with possible endoscopy to assess for esophageal disorders. Pt has history of alcohol abuse and current alcohol use along with recent cocaine use. Pt told ST many times today, he was done drinking liquor as he wanted "to live".ST to follow up with toleration of diet. Pt will need assist with all meals. Intake likely to be poor. Prognosis fair. SLP Visit Diagnosis: Dysphagia, oropharyngeal phase (R13.12)    Aspiration Risk  Mild aspiration risk;Moderate aspiration risk    Diet Recommendation Dysphagia 1 (Puree);Thin liquid   Liquid Administration via: Other (Comment) (single sips) Medication Administration: Crushed with puree Supervision: Full supervision/cueing for compensatory strategies Compensations: Minimize environmental distractions;Small sips/bites;Other (Comment) (assist with meals) Postural Changes: Seated upright at 90 degrees;Remain upright for at least 30 minutes after po intake    Other  Recommendations Recommended Consults: Consider GI evaluation   Follow up Recommendations         Frequency and Duration min 2x/week  1 week       Prognosis Prognosis for Safe Diet Advancement: [place] Date of Onset: 04/29/20 HPI: Per admitting H&P "ERRICK SALTS is a 70 y.o. male with medical history significant for hypertension, interstitial lung disease, heart failure reduced ejection fraction, hyperlipidemia, history of atrial fibrillation, presents emergency department by EMS for shortness of breath.     Mr. Hagy states that his shortness of breath is worse with exertion and laying flat.  He reports that shortness of breath started this morning.  He denies fever, chills, nausea, vomiting, diarrhea, chest pain.  He does endorse coughing of white phlegm.  Denies weight gain.  He denies changes to diet. He states this feels like his previous episodes of heart failure exacerbation.     He states to me that he has not had his medication for 1 to 2 weeks.  He states he ran out.  He does endorse to me that he drinks alcohol daily and last week and he had a party/guess over.  "  SHISHIR KRANTZ is a 70 y.o. male with medical history significant for hypertension, interstitial lung disease, heart failure reduced ejection fraction, hyperlipidemia, history of atrial fibrillation, presents emergency department by EMS for shortness of breath.     Mr. Buccheri states that his shortness of breath is worse with exertion and laying flat.  He reports that shortness of breath started this morning.  He denies fever, chills, nausea, vomiting, diarrhea, chest pain.  He does endorse coughing of white phlegm.  Denies weight gain.  He denies changes to diet. He states this feels like his previous episodes of heart failure exacerbation.     He states to me that he has not had his medication for 1 to 2 weeks.  He states he ran out.  He does endorse to me that he drinks alcohol daily and last week and he had a party/guess over." Type of Study: Bedside Swallow Evaluation Diet Prior to this Study:  Dysphagia 2 (chopped) Temperature Spikes Noted: No History of Recent Intubation: No Behavior/Cognition: Alert;Pleasant mood;Confused;Requires cueing;Distractible Oral Cavity Assessment: Other (comment) (Saya his teeth hurt and has a lesion on his lip that is painful) Oral Care Completed by SLP: No Oral Cavity - Dentition: Poor condition;Missing dentition Vision:  (Needs assist) Self-Feeding Abilities: Needs assist Patient Positioning: Upright in bed Baseline Vocal Quality: Breathy;Hoarse Volitional Cough: Congested    Oral/Motor/Sensory Function Overall Oral Motor/Sensory Function: Within functional limits   Ice Chips Ice chips: Not tested   Thin Liquid Thin Liquid: Within functional limits Presentation: Straw    Nectar Thick Nectar Thick Liquid: Within functional limits Presentation: Cup   Honey Thick Honey Thick Liquid: Not tested   Puree Puree: Within functional limits Presentation: Spoon   Solid     Solid: Not tested      Lucila Maine 05/06/2020,11:36 AM

## 2020-05-06 NOTE — Care Management Important Message (Signed)
Important Message  Patient Details  Name: Jason Fitzgerald MRN: [id number] Date of Birth: 02/21/1950   Medicare Important Message Given:  Yes     [name] 05/06/2020, 12:27 PM

## 2020-05-06 NOTE — Progress Notes (Signed)
Pulmonary Medicine          Date: 05/06/2020,   MRN# [id number] Jason Fitzgerald [birth year]     AdmissionWeight: 74.8 kg                 CurrentWeight: 78.6 kg   Referring physician: Dr Billie Ruddy   CHIEF COMPLAINT:   Acute on chronic hypoxemic respiratory failure in context of pulmonary fibrosis.    HISTORY OF PRESENT ILLNESS   70 yo M w PMH of CHF , COPD, substance abuse , Pulmonary fibrosis, emphysema, chronic hypoxemic respiratory failure, chronic cough who has had recurrent hospitalization for respiratory failure. He has previously been referred to hospice due to comorbid health status however has declined comfort care measures and wishes to continue therapy. He had Transthoracic echo 04/18/20 and it showed severe CHF with EF <97%, RV systolic fucntion is also reduced, there is severe MR. He had CT chest done 12/2019 with findings of basal predominant bilateral pulmonary fibrosis with associated traction bronchiectasis and concomitant upper lobe predominant centriobular emphysema with mild to moderate pleural effusions. Pictorial documentation is included below. Pulmonary consultation placed per patients wishes to have more respiratory workup to help his chronic lung disease.    05/04/20-Patient reports improvement clinically. He feels less dyspnea and wants to try physical therapy more. He has less LE edema.  His urine is dark and renal function worse, discussed with attending physician will hold diuresis. We reviewed incentive spirometry techinique, he is very deconditioned takes volumes up to 350cc only.  He is on room air and BP is low and HR is 74 despite stopping beta blocker and placing on midodrine 10 TID. Cardiology is on case will review regarding re-initiation of beta blockade in lieu of CHF but currently I would favor holding until further guidance due to risk of hypotension.  He is on florinef and prednisone 20 mg due to adrenal insufficiency and interstitial lung disease,  this was started yesterday as salvage therapy for this patient.  We discussed smoking cessation today, he smoked Thursday and states the has not smoked in the hospital which is good.   05/05/20- Patient is sitting up in bed in no distress. He refused blood work today and I discussed this with him.  He worked with [facility] OT service and is improved. Reviewed care plan with Dr Billie Ruddy today.   05/06/2020-patient evaluated at bedside, resting in bed in no acute distress, reports breathing is better..  No acute events overnight.  Repeat blood work noted with significant worsening of his renal impairment.  CBC with differential improved with normalization of WBC count.  SPO2 100% on room air with normal vital signs today.  Ongoing PT and OT. He remains on midodrine has not had diuretics in 24h, appears dry on examination and has increased creatinine today.  I ordered renal evalaution and discussed this with Dr Billie Ruddy.     PAST MEDICAL HISTORY   Past Medical History:  Diagnosis Date  . CHF (congestive heart failure) ([place])   . Chronic cough   . COPD (chronic obstructive pulmonary disease) ([place])   . Elevated liver function tests   . Emphysema of lung ([place])   . Fibrosis, pulmonary, interstitial, diffuse ([place])   . GERD (gastroesophageal reflux disease)   . History of cocaine abuse ([place])   . Mediastinal lymphadenopathy   . Pulmonary fibrosis ([place])   . Right inguinal hernia      SURGICAL HISTORY   Past Surgical History:  Procedure  Laterality Date  . COLONOSCOPY WITH PROPOFOL N/A 08/19/2018   Procedure: COLONOSCOPY WITH PROPOFOL;  Surgeon: Virgel Manifold, MD;  Location: [facility] ENDOSCOPY;  Service: Endoscopy;  Laterality: N/A;  . HERNIA REPAIR Right 1973   open  . INGUINAL HERNIA REPAIR Right 11/08/2014   Procedure: LAPAROSCOPIC RIGHT INGUINAL HERNIA REPAIR;  Surgeon: Hubbard Robinson, MD;  Location: [facility] ORS;  Service: General;  Laterality: Right;  . knee arthroscopy Right 1972  . RIGHT/LEFT HEART  CATH AND CORONARY ANGIOGRAPHY Right 03/05/2017   Procedure: RIGHT/LEFT HEART CATH AND CORONARY ANGIOGRAPHY;  Surgeon: Dionisio David, MD;  Location: [facility];  Service: Cardiovascular;  Laterality: Right;     FAMILY HISTORY   Family History  Problem Relation Age of Onset  . Diabetes Mother   . Cancer Father   . AAA (abdominal aortic aneurysm) Brother      SOCIAL HISTORY   Social History   Tobacco Use  . Smoking status: Never Smoker  . Smokeless tobacco: Never Used  Vaping Use  . Vaping Use: Never used  Substance Use Topics  . Alcohol use: Not Currently    Alcohol/week: 0.0 standard drinks    Comment: 2 quarts a week, he quit again 12/2018  . Drug use: Not Currently    Types: Cocaine    Comment: as an young adult      MEDICATIONS    Home Medication:    Current Medication:  Current Facility-Administered Medications:  .  acetaminophen (TYLENOL) tablet 1,000 mg, 1,000 mg, Oral, TID PRN, Enzo Bi, MD, 1,000 mg at 05/05/20 0945 .  atorvastatin (LIPITOR) tablet 40 mg, 40 mg, Oral, Daily, Cox, Amy N, DO, 40 mg at 05/05/20 0946 .  calcium carbonate (TUMS - dosed in mg elemental calcium) chewable tablet 400 mg of elemental calcium, 400 mg of elemental calcium, Oral, TID WC, Sharen Hones, MD, 400 mg of elemental calcium at 05/05/20 1756 .  collagenase (SANTYL) ointment, , Topical, Daily, Enzo Bi, MD, Given at 05/04/20 [phone] .  dapagliflozin propanediol (FARXIGA) tablet 10 mg, 10 mg, Oral, Daily, Cox, Amy N, DO, 10 mg at 05/05/20 0948 .  enoxaparin (LOVENOX) injection 40 mg, 40 mg, Subcutaneous, Q24H, Enzo Bi, MD, 40 mg at 05/05/20 1113 .  fludrocortisone (FLORINEF) tablet 0.1 mg, 0.1 mg, Oral, Daily, Lanney Gins, Mayreli Alden, MD, 0.1 mg at 05/05/20 0948 .  folic acid (FOLVITE) tablet 1 mg, 1 mg, Oral, Daily, Cox, Amy N, DO, 1 mg at 05/05/20 0946 .  midodrine (PROAMATINE) tablet 10 mg, 10 mg, Oral, TID WC, Cox, Amy N, DO, 10 mg at 05/05/20 1756 .  multivitamin with  minerals tablet 1 tablet, 1 tablet, Oral, Daily, Cox, Amy N, DO, 1 tablet at 05/05/20 0945 .  ondansetron (ZOFRAN) tablet 4 mg, 4 mg, Oral, Q6H PRN **OR** ondansetron (ZOFRAN) injection 4 mg, 4 mg, Intravenous, Q6H PRN, Cox, Amy N, DO, 4 mg at 05/04/20 0956 .  polyethylene glycol (MIRALAX / GLYCOLAX) packet 17 g, 17 g, Oral, Daily, Enzo Bi, MD, 17 g at 05/05/20 0948 .  predniSONE (DELTASONE) tablet 20 mg, 20 mg, Oral, Q breakfast, Leenah Seidner, MD, 20 mg at 05/05/20 0946 .  thiamine tablet 100 mg, 100 mg, Oral, Daily, 100 mg at 05/05/20 0945 **OR** thiamine (B-1) injection 100 mg, 100 mg, Intravenous, Daily, Cox, Amy N, DO, 100 mg at 05/02/20 3220    ALLERGIES   Patient has no known allergies.     REVIEW OF SYSTEMS    Review of Systems:  Gen:  Denies  fever, sweats, chills weigh loss  HEENT: Denies blurred vision, double vision, ear pain, eye pain, hearing loss, nose bleeds, sore throat Cardiac:  No dizziness, chest pain or heaviness, chest tightness,edema Resp:   Denies cough or sputum porduction, shortness of breath,wheezing, hemoptysis,  Gi: Denies swallowing difficulty, stomach pain, nausea or vomiting, diarrhea, constipation, bowel incontinence Gu:  Denies bladder incontinence, burning urine Ext:   Denies Joint pain, stiffness or swelling Skin: Denies  skin rash, easy bruising or bleeding or hives Endoc:  Denies polyuria, polydipsia , polyphagia or weight change Psych:   Denies depression, insomnia or hallucinations   Other:  All other systems negative   VS: BP 102/75 (BP Location: Right Arm)   Pulse 81   Temp 97.6 F (36.4 C) (Axillary)   Resp 18   Ht 6' (1.829 m)   Wt 78.6 kg   SpO2 100%   BMI 23.50 kg/m      PHYSICAL EXAM    GENERAL:NAD, no fevers, chills, no weakness no fatigue HEAD: Normocephalic, atraumatic.  EYES: Pupils equal, round, reactive to light. Extraocular muscles intact. No scleral icterus.  MOUTH: Moist mucosal membrane. Dentition  intact. No abscess noted.  EAR, NOSE, THROAT: Clear without exudates. No external lesions.  NECK: Supple. No thyromegaly. No nodules. No JVD.  PULMONARY: crepitations bilaterally suggestive of pulmoary fibrosis worse at bases. CARDIOVASCULAR: S1 and S2. Regular rate and rhythm. No murmurs, rubs, or gallops. No edema. Pedal pulses 2+ bilaterally.  GASTROINTESTINAL: Soft, nontender, nondistended. No masses. Positive bowel sounds. No hepatosplenomegaly.  MUSCULOSKELETAL: No swelling, clubbing, or edema. Range of motion full in all extremities.  NEUROLOGIC: Cranial nerves II through XII are intact. No gross focal neurological deficits. Sensation intact. Reflexes intact.  SKIN: No ulceration, lesions, rashes, or cyanosis. Skin warm and dry. Turgor intact.  PSYCHIATRIC: Mood, affect within normal limits. The patient is awake, alert and oriented x 3. Insight, judgment intact.       IMAGING    CT Abdomen Pelvis W Contrast  Result Date: 04/29/2020 CLINICAL DATA:  Acute nonlocalized abdominal pain EXAM: CT ABDOMEN AND PELVIS WITH CONTRAST TECHNIQUE: Multidetector CT imaging of the abdomen and pelvis was performed using the standard protocol following bolus administration of intravenous contrast. CONTRAST:  177mL OMNIPAQUE IOHEXOL 300 MG/ML  SOLN COMPARISON:  07/20/2013 FINDINGS: Lower chest: Small bilateral pleural effusion. Fibrosis with honeycombing is seen diffusely at the lung bases. Heart size is within normal limits. Gynecomastia. Hepatobiliary: Heterogeneous density of the liver with nutmeg appearance. Subcapsular right hepatic cyst. No solid masslike finding.Calcification at the gallbladder fossa which appears extraluminal and is chronic. No evidence of biliary inflammation or obstruction. Pancreas: Unremarkable. Spleen: Unremarkable. Adrenals/Urinary Tract: Negative adrenals. No hydronephrosis or stone. Small renal cystic densities. Unremarkable bladder. Stomach/Bowel:  No obstruction. No visible  inflammation of bowel. Vascular/Lymphatic: No acute vascular abnormality. Scattered atheromatous calcification of the aorta. No mass or adenopathy. Reproductive:Partially covered right hydrocele Other: No ascites or pneumoperitoneum.  Hernia repair using mesh. Musculoskeletal: No acute abnormalities.  Lumbar spine degeneration. IMPRESSION: 1. No acute intra-abdominal finding. 2. Heterogeneous liver perfusion which could be from fibrosis or right heart failure. 3. Small pleural effusions. 4.  Aortic Atherosclerosis (ICD10-I70.0). Electronically Signed   By: Monte Fantasia M.D.   On: 04/29/2020 11:31   US RENAL  Result Date: 04/20/2020 CLINICAL DATA:  AK I EXAM: RENAL / URINARY TRACT ULTRASOUND COMPLETE COMPARISON:  None. FINDINGS: Right Kidney: Renal measurements: 11.3 x 5.3 x 6.0 cm = volume: 187  mL. Echogenicity within normal limits. No mass or hydronephrosis visualized. Left Kidney: Renal measurements: 11.7 x 5.5 x 5.8 cm = volume: 194 mL. Echogenicity within normal limits. No mass or hydronephrosis visualized. Bladder: Decompressed with Foley catheter in place. Other: None. IMPRESSION: Unremarkable sonographic appearance of the bilateral kidneys. Electronically Signed   By: Audie Pinto M.D.   On: 04/20/2020 11:49   DG Chest Port 1 View  Result Date: 04/29/2020 CLINICAL DATA:  Shortness of breath and pain. EXAM: PORTABLE CHEST 1 VIEW COMPARISON:  04/18/2020 FINDINGS: Stable cardiomediastinal contours. Extensive fibrotic lung disease is identified within both lungs. This is most advanced within the mid and lower lung zones. No definite superimposed pulmonary opacity. There is a suggestion of subcutaneous gas within the soft tissues in the left supraclavicular region. IMPRESSION: 1. Extensive fibrotic lung disease. 2. No superimposed acute cardiopulmonary abnormality. Electronically Signed   By: Kerby Moors M.D.   On: 04/29/2020 10:43   DG Chest Port 1 View  Result Date: 04/18/2020 CLINICAL  DATA:  Chest pain EXAM: PORTABLE CHEST 1 VIEW COMPARISON:  December 08, 2019 chest radiograph; chest CT December 09, 2019 FINDINGS: There is extensive fibrosis throughout the lungs, most severely in the mid and lower lung regions. There are small pleural effusions bilaterally. The heart size is within normal limits. Pulmonary vascularity is within normal limits. Lymph node prominence noted on prior CT is not well delineated radiography. No bone lesions. IMPRESSION: Extensive underlying fibrosis with small pleural effusions bilaterally. No appreciable new opacity compared to prior studies. Stable cardiac silhouette. Electronically Signed   By: Lowella Grip III M.D.   On: 04/18/2020 09:04   ECHOCARDIOGRAM COMPLETE  Result Date: 04/19/2020    ECHOCARDIOGRAM REPORT   Patient Name:   Jason Fitzgerald Date of Exam: 04/18/2020 Medical Rec #:  [id number]       Height:       69.0 in Accession #:    [id number]      Weight:       171.0 lb Date of Birth:  1950-10-08      BSA:          1.933 m Patient Age:    63 years        BP:           100/68 mmHg Patient Gender: M               HR:           94 bpm. Exam Location:  [facility] Procedure: 2D Echo, Cardiac Doppler and Color Doppler Indications:     CHF-acute systolic O87.86  History:         Patient has prior history of Echocardiogram examinations, most                  recent 12/09/2019. CHF; COPD.  Sonographer:     Sherrie Sport RDCS (AE) Referring Phys:  [id number] [place] Diagnosing Phys: Neoma Laming MD  Sonographer Comments: Suboptimal parasternal window. IMPRESSIONS  1. Left ventricular ejection fraction, by estimation, is <20%. The left ventricle has severely decreased function. The left ventricle demonstrates global hypokinesis. The left ventricular internal cavity size was severely dilated. There is mild concentric left ventricular hypertrophy. Left ventricular diastolic parameters are consistent with Grade II diastolic dysfunction (pseudonormalization).  2. Right  ventricular systolic function is mildly reduced. The right ventricular size is moderately enlarged.  3. Left atrial size was severely dilated.  4. Right atrial size was severely dilated.  5. 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 No evidence of mitral stenosis.  6. The aortic valve is grossly normal. Aortic valve regurgitation is not visualized. Mild aortic valve sclerosis is present, with no evidence of aortic valve stenosis. FINDINGS  Left Ventricle: Left ventricular ejection fraction, by estimation, is <20%. The left ventricle has severely decreased function. The left ventricle demonstrates global hypokinesis. The left ventricular internal cavity size was severely dilated. There is mild concentric left ventricular hypertrophy. Left ventricular diastolic parameters are consistent with Grade II diastolic dysfunction (pseudonormalization). Right Ventricle: The right ventricular size is moderately enlarged. No increase in right ventricular wall thickness. Right ventricular systolic function is mildly reduced. Left Atrium: Left atrial size was severely dilated. Right Atrium: Right atrial size was severely dilated. Pericardium: There is no evidence of pericardial effusion. Mitral Valve: 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 No evidence of mitral valve stenosis. Tricuspid Valve: The tricuspid valve is grossly normal. Tricuspid valve regurgitation is mild. Aortic Valve: The aortic valve is grossly normal. Aortic valve regurgitation is not visualized. Mild aortic valve sclerosis is present, with no evidence of aortic valve stenosis. Aortic valve mean gradient measures 1.0 mmHg. Aortic valve peak gradient measures 1.7 mmHg. Aortic valve area, by VTI measures 1.85 cm. Pulmonic Valve: The pulmonic valve was normal in structure. Pulmonic valve regurgitation is trivial. Aorta: The aortic root, ascending aorta and aortic arch are all structurally normal, with no evidence  of dilitation or obstruction. IAS/Shunts: The interatrial septum was not assessed.  LEFT VENTRICLE PLAX 2D LVIDd:         5.57 cm LVIDs:         5.01 cm LV PW:         0.90 cm LV IVS:        0.78 cm LVOT diam:     2.10 cm LV SV:         13 LV SV Index:   7 LVOT Area:     3.46 cm  LV Volumes (MOD) LV vol d, MOD A2C: 115.0 ml LV vol d, MOD A4C: 194.0 ml LV vol s, MOD A2C: 91.0 ml LV vol s, MOD A4C: 154.0 ml LV SV MOD A2C:     24.0 ml LV SV MOD A4C:     194.0 ml LV SV MOD BP:      27.8 ml RIGHT VENTRICLE RV S prime:     9.79 cm/s TAPSE (M-mode): 3.7 cm LEFT ATRIUM             Index       RIGHT ATRIUM           Index LA diam:        4.30 cm 2.22 cm/m  RA Area:     16.10 cm LA Vol (A2C):   84.0 ml 43.46 ml/m RA Volume:   43.50 ml  22.50 ml/m LA Vol (A4C):   56.0 ml 28.97 ml/m LA Biplane Vol: 69.5 ml 35.96 ml/m  AORTIC VALVE                   PULMONIC VALVE AV Area (Vmax):    1.41 cm    PV Vmax:        0.18 m/s AV Area (Vmean):   1.72 cm    PV Peak grad:   0.1 mmHg AV Area (VTI):     1.85 cm    RVOT Peak grad: 1 mmHg AV Vmax:  66.00 cm/s AV Vmean:          42.200 cm/s AV VTI:            0.072 m AV Peak Grad:      1.7 mmHg AV Mean Grad:      1.0 mmHg LVOT Vmax:         26.80 cm/s LVOT Vmean:        21.000 cm/s LVOT VTI:          0.039 m LVOT/AV VTI ratio: 0.54  AORTA Ao Root diam: 2.83 cm MITRAL VALVE                TRICUSPID VALVE MV Area (PHT): 5.16 cm     TR Peak grad:   32.7 mmHg MV Decel Time: 147 msec     TR Vmax:        286.00 cm/s MV E velocity: 118.00 cm/s                             SHUNTS                             Systemic VTI:  0.04 m                             Systemic Diam: 2.10 cm Neoma Laming MD Electronically signed by Neoma Laming MD Signature Date/Time: 04/19/2020/8:09:07 AM    Final       ASSESSMENT/PLAN    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  Will dc cefepime. continue zithromax only 250 PO.     - prednisone 20 mg po daily and florinef .1 tid      Acute on chronic systolic CHF   - he has 1+ LE edema but he has effusions bilaterally, will try to gently diurese with albumin    Cardiorenal syndrome  -renal consult - appreciate input  - palliative on case appreciate input  Severe protein calorie malnutrition  bitemporal wasting and peripheral muscle wasting.  -Nutrition and PT/OT   Thank you for allowing me to participate in the care of this patient.   Patient/Family are satisfied with care plan and all questions have been answered.  This document was prepared using Dragon voice recognition software and may include unintentional dictation errors.     Ottie Glazier, M.D.  Division of [place]

## 2020-05-06 NOTE — Progress Notes (Signed)
Pt complain of chest pain. Unable to give pain score. See flowsheet for vitals. MD notified via secure chat. No new orders at this time

## 2020-05-06 NOTE — Consult Note (Addendum)
Consultation Note Date: 05/06/2020   Patient Name: Jason Fitzgerald  DOB: September 04, 1950  MRN: [id number]  Age / Sex: 70 y.o., male  PCP: Steele Sizer, MD Referring Physician: Enzo Bi, MD  Reason for Consultation: Establishing goals of care  HPI/Patient Profile:  Jason Fitzgerald is a 70 y.o. male with medical history significant for hypertension, interstitial lung disease, heart failure reduced ejection fraction, hyperlipidemia, history of atrial fibrillation, presents emergency department by EMS for shortness of breath.  Clinical Assessment and Goals of Care: Patient is resting in bed with one of his sisters at bedside. He is confused and unable to answer questions. He perseverates on discussing a hand surgery that is supposed to take place tomorrow. Sister points to the patient's daughter's phone number on the whiteboard in his room.  Stepped out and called daughter. We discussed his diagnosis and prognosis.  Created space and opportunity for patient  to explore thoughts and feelings regarding current medical information. She states until this admission, she did not know he used cocaine. She states she has broached this with him already.   A detailed discussion was had today regarding advanced directives. The difference between an aggressive medical intervention path and a comfort care path was discussed.  Values and goals of care important to patient and family were attempted to be elicited.  Discussed limitations of medical interventions to prolong quality of life in some situations and discussed the concept of human mortality.  Daughter states her father is widowed, her bother died, and her sister is 68 y/o making her Air traffic controller. She states her aunt Jason Fitzgerald helps with her decision making. She has been updated by medical teams and articulates his status well.  She states she will try to speak  with her family and patient about care moving forward.     SUMMARY OF RECOMMENDATIONS   Will follow up tomorrow with daughter as she will speak with family. Patient is confused.   Prognosis:   < 6 months      Primary Diagnoses: Present on Admission: . Shortness of breath . AF (paroxysmal atrial fibrillation) ([place]) . Anemia . Arteriosclerosis of coronary artery . Atrial fibrillation ([place]) . Chronic cough . Chronic systolic CHF (congestive heart failure) ([place]) . COPD (chronic obstructive pulmonary disease) ([place]) . Degenerative arthritis of lumbar spine . DNR (do not resuscitate) . GERD (gastroesophageal reflux disease) . Interstitial lung disease ([place]) . Fibrosis, pulmonary, interstitial, diffuse ([place]) . Rheumatoid arthritis involving multiple sites with positive rheumatoid factor ([place]) . HTN (hypertension) . Prolonged QT interval . Acute on chronic systolic (congestive) heart failure ([place])   I have reviewed the medical record, interviewed the patient and family, and examined the patient. The following aspects are pertinent.  Past Medical History:  Diagnosis Date  . CHF (congestive heart failure) ([place])   . Chronic cough   . COPD (chronic obstructive pulmonary disease) ([place])   . Elevated liver function tests   . Emphysema of lung ([place])   . Fibrosis, pulmonary, interstitial, diffuse ([place])   .  GERD (gastroesophageal reflux disease)   . History of cocaine abuse ([place])   . Mediastinal lymphadenopathy   . Pulmonary fibrosis ([place])   . Right inguinal hernia    Social History   Socioeconomic History  . Marital status: Divorced    Spouse name: Not on file  . Number of children: 4  . Years of education: Not on file  . Highest education level: Not on file  Occupational History  . Occupation: retired   Tobacco Use  . Smoking status: Never Smoker  . Smokeless tobacco: Never Used  Vaping Use  . Vaping Use: Never used  Substance and Sexual Activity  . Alcohol use: Not  Currently    Alcohol/week: 0.0 standard drinks    Comment: 2 quarts a week, he quit again 12/2018  . Drug use: Not Currently    Types: Cocaine    Comment: as an young adult   . Sexual activity: Not Currently  Other Topics Concern  . Not on file  Social History Narrative   Retired Summer of 2019   Lives alone   He had 4 children. One son was killed years ago, but has 3 living children    Social Determinants of Radio broadcast assistant Strain: Not on file  Food Insecurity: Not on file  Transportation Needs: Not on file  Physical Activity: Not on file  Stress: Not on file  Social Connections: Not on file   Family History  Problem Relation Age of Onset  . Diabetes Mother   . Cancer Father   . AAA (abdominal aortic aneurysm) Brother    Scheduled Meds: . atorvastatin  40 mg Oral Daily  . calcium carbonate  400 mg of elemental calcium Oral TID WC  . collagenase   Topical Daily  . [START ON 05/07/2020] enoxaparin (LOVENOX) injection  30 mg Subcutaneous Q24H  . feeding supplement (NEPRO CARB STEADY)  237 mL Oral BID BM  . fludrocortisone  0.1 mg Oral Daily  . folic acid  1 mg Oral Daily  . midodrine  10 mg Oral TID WC  . multivitamin with minerals  1 tablet Oral Daily  . polyethylene glycol  17 g Oral Daily  . [START ON 05/07/2020] predniSONE  10 mg Oral Q breakfast  . thiamine  100 mg Oral Daily   Or  . thiamine  100 mg Intravenous Daily   Continuous Infusions: PRN Meds:.acetaminophen, ondansetron **OR** ondansetron (ZOFRAN) IV Medications Prior to Admission:  Prior to Admission medications   Medication Sig Start Date End Date Taking? Authorizing Provider  acetaminophen (TYLENOL) 325 MG tablet Take 2 tablets (650 mg total) by mouth every 4 (four) hours as needed for headache or mild pain. 04/22/20  Yes Debbe Odea, MD  Multiple Vitamin (MULTIVITAMIN WITH MINERALS) TABS tablet Take 1 tablet by mouth daily. 04/22/20  Yes Debbe Odea, MD  thiamine 100 MG tablet Take 1 tablet  (100 mg total) by mouth daily. 04/22/20  Yes Debbe Odea, MD  atorvastatin (LIPITOR) 40 MG tablet Take 1 tablet (40 mg total) by mouth daily. 04/22/20   Debbe Odea, MD  folic acid (FOLVITE) 1 MG tablet Take 1 tablet (1 mg total) by mouth daily. 04/22/20   Debbe Odea, MD  furosemide (LASIX) 20 MG tablet Take 1 tablet (20 mg total) by mouth daily as needed. Take if your weight rises by 3 lb in 2 days 04/22/20 04/22/21  Debbe Odea, MD  metoprolol succinate (TOPROL-XL) 25 MG 24 hr tablet Take 1 tablet (25  mg total) by mouth daily. 04/22/20   Debbe Odea, MD  potassium chloride SA (KLOR-CON) 20 MEQ tablet Take 1 tablet (20 mEq total) by mouth daily as needed. Take only when taking Lasix 04/22/20   Debbe Odea, MD   No Known Allergies Review of Systems  All other systems reviewed and are negative.   Physical Exam Pulmonary:     Effort: Pulmonary effort is normal.  Neurological:     Mental Status: He is alert.     Vital Signs: BP 101/66 (BP Location: Right Arm)   Pulse 81   Temp 97.7 F (36.5 C)   Resp 18   Ht 6' (1.829 m)   Wt 78.6 kg   SpO2 90%   BMI 23.50 kg/m  Pain Scale: 0-10   Pain Score: 0-No pain   SpO2: SpO2: 90 % O2 Device:SpO2: 90 % O2 Flow Rate: .   IO: Intake/output summary:   Intake/Output Summary (Last 24 hours) at 05/06/2020 1526 Last data filed at 05/05/2020 2000 Gross per 24 hour  Intake 120 ml  Output --  Net 120 ml    LBM: Last BM Date: 05/04/20 Baseline Weight: Weight: 74.8 kg Most recent weight: Weight: 78.6 kg       Time In: 2:50 Time Out: 3:20 Time Total: 30 min Greater than 50%  of this time was spent counseling and coordinating care related to the above assessment and plan.  Signed by: Asencion Gowda, NP   Please contact Palliative Medicine Team phone at [phone] for questions and concerns.  For individual provider: See Shea Evans

## 2020-05-06 NOTE — Progress Notes (Signed)
Physical Therapy Treatment Patient Details Name: Jason Fitzgerald MRN: [id number] DOB: 1950-03-13 Today's Date: 05/06/2020    History of Present Illness 70 y.o. male with medical history significant for hypertension, interstitial lung disease, heart failure reduced ejection fraction, hyperlipidemia, history of atrial fibrillation, presents emergency department by EMS for shortness of breath.    PT Comments    Pt in bed initially declining therapy stating he hears a disturbance and gunshots.  Generally on edge.  But agrees to repositioning and then to ex with encouragement.  Pt found with R LE up against side rail with reddened area right lateral lower leg from pressure.  Pt stated it was hurting but made no attempt to reposition it himself.  He is able to move BLE's on his own so strength is not a limiting factor.  After BLE AROM which he self directs and limits, RLE is placed on pillow to prevent further pressure/irritation on LE.  He is encouraged several times to sit EOB throughout session but declined each time despite education and encouragement.  He stated he is weak and needs to get stronger but continues to decliner further activity at this point.  Pillow placed under RLE for pressure relief and to prevent further injury to area.  Area red at this time and non-blanching.  Tech and PA notified when they came to room and DM to RN made.  Pt educated on awareness.     Follow Up Recommendations  SNF     Equipment Recommendations  None recommended by PT    Recommendations for Other Services       Precautions / Restrictions Precautions Precautions: Fall Restrictions Weight Bearing Restrictions: No    Mobility  Bed Mobility               General bed mobility comments: refused    Transfers                    Ambulation/Gait                 Stairs             Wheelchair Mobility    Modified Rankin (Stroke Patients Only)       Balance                                             Cognition Arousal/Alertness: Awake/alert Behavior During Therapy: WFL for tasks assessed/performed Overall Cognitive Status: No family/caregiver present to determine baseline cognitive functioning                                 General Comments: stated he hears a "disturbance" and gun shots and seems generally on edge but unable to explain further.      Exercises Other Exercises Other Exercises: BLE AROM self directed and limiting but is able to move LE's on his own with cues.    General Comments        Pertinent Vitals/Pain Pain Assessment: Faces Faces Pain Scale: Hurts whole lot Pain Location: any light touch yells out and flinches. Pain Descriptors / Indicators: Grimacing;Guarding;Tender;Other (Comment);Moaning Pain Intervention(s): Limited activity within patient's tolerance;Monitored during session;Repositioned    Home Living  Prior Function            PT Goals (current goals can now be found in the care plan section) Progress towards PT goals: Not progressing toward goals - comment    Frequency    Min 2X/week      PT Plan Current plan remains appropriate    Co-evaluation              AM-PAC PT "6 Clicks" Mobility   Outcome Measure  Help needed turning from your back to your side while in a flat bed without using bedrails?: A Lot Help needed moving from lying on your back to sitting on the side of a flat bed without using bedrails?: A Lot Help needed moving to and from a bed to a chair (including a wheelchair)?: A Lot Help needed standing up from a chair using your arms (e.g., wheelchair or bedside chair)?: A Lot Help needed to walk in hospital room?: Total Help needed climbing 3-5 steps with a railing? : Total 6 Click Score: 10    End of Session   Activity Tolerance: Patient tolerated treatment well;Patient limited by pain Patient left: in  bed;with call bell/phone within reach;with bed alarm set Nurse Communication: Mobility status PT Visit Diagnosis: Other abnormalities of gait and mobility (R26.89);Unsteadiness on feet (R26.81);Pain;Muscle weakness (generalized) (M62.81)     Time: 1027-2536 PT Time Calculation (min) (ACUTE ONLY): 23 min  Charges:  $Therapeutic Exercise: 8-22 mins $Therapeutic Activity: 8-22 mins                    Chesley Noon, PTA 05/06/20, 9:56 AM

## 2020-05-07 DIAGNOSIS — R0602 Shortness of breath: Secondary | ICD-10-CM | POA: Diagnosis not present

## 2020-05-07 LAB — BASIC METABOLIC PANEL
Anion gap: 14 (ref 5–15)
BUN: 82 mg/dL — ABNORMAL HIGH (ref 8–23)
CO2: 23 mmol/L (ref 22–32)
Calcium: 8.7 mg/dL — ABNORMAL LOW (ref 8.9–10.3)
Chloride: 97 mmol/L — ABNORMAL LOW (ref 98–111)
Creatinine, Ser: 3.04 mg/dL — ABNORMAL HIGH (ref 0.61–1.24)
GFR, Estimated: 22 mL/min — ABNORMAL LOW (ref >60)
Glucose, Bld: 126 mg/dL — ABNORMAL HIGH (ref 70–99)
Potassium: 3.9 mmol/L (ref 3.5–5.1)
Sodium: 134 mmol/L — ABNORMAL LOW (ref 135–145)

## 2020-05-07 LAB — CBC
HCT: 33.1 % — ABNORMAL LOW (ref 39.0–52.0)
Hemoglobin: 11.2 g/dL — ABNORMAL LOW (ref 13.0–17.0)
MCH: 26.6 pg (ref 26.0–34.0)
MCHC: 33.8 g/dL (ref 30.0–36.0)
MCV: 78.6 fL — ABNORMAL LOW (ref 80.0–100.0)
Platelets: 184 10*3/uL (ref 150–400)
RBC: 4.21 MIL/uL — ABNORMAL LOW (ref 4.22–5.81)
RDW: 15.4 % (ref 11.5–15.5)
WBC: 7.1 10*3/uL (ref 4.0–10.5)
nRBC: 1.1 % — ABNORMAL HIGH (ref 0.0–0.2)

## 2020-05-07 LAB — MAGNESIUM: Magnesium: 2.9 mg/dL — ABNORMAL HIGH (ref 1.7–2.4)

## 2020-05-07 NOTE — Progress Notes (Signed)
Occupational Therapy Treatment Patient Details Name: Jason Fitzgerald MRN: [id number] DOB: [birth year] Today's Date: 05/07/2020    History of present illness 70 y.o. male with medical history significant for hypertension, interstitial lung disease, heart failure reduced ejection fraction, hyperlipidemia, history of atrial fibrillation, presents emergency department by EMS for shortness of breath.   OT comments  Jason Fitzgerald demonstrated progress relative to prior OT sessions, reporting no pain today, better orientation to time and situation, and improved bed mobility. Despite repeated attempts to encourage pt to stand/transfer, pt refused, stating he did not want to try "anything like that" unless his sister was present. He was willing to participate, however, in supine therex for both UE and LE, as well as rolling in bed with only MinA required (MaxA required in previous sessions). Jason Fitzgerald will continue to benefit from ongoing OT while hospitalized. Given pt's high level of support required for ADL tasks, DC to SNF remains appropriate POC.   Follow Up Recommendations  SNF;Supervision/Assistance - 24 hour    Equipment Recommendations       Recommendations for Other Services      Precautions / Restrictions Precautions Precautions: Fall Restrictions Weight Bearing Restrictions: No       Mobility Bed Mobility Overal bed mobility: Needs Assistance Bed Mobility: Rolling Rolling: Min assist              Transfers Overall transfer level: Needs assistance               General transfer comment: Pt declines OOB activity    Balance                                           ADL either performed or assessed with clinical judgement   ADL Overall ADL's : Needs assistance/impaired                                       General ADL Comments: Pt declines OOB fxl activity, reporting that he wants to "wait until my sister gets here" before  attempting to leave bed     Vision Baseline Vision/History: Wears glasses Wears Glasses: At all times Patient Visual Report: No change from baseline     Perception     Praxis      Cognition Arousal/Alertness: Awake/alert Behavior During Therapy: WFL for tasks assessed/performed Overall Cognitive Status: No family/caregiver present to determine baseline cognitive functioning                                 General Comments: Some word-finding difficulty. Able to correctly identify year, but states that month is October.        Exercises Total Joint Exercises Ankle Circles/Pumps: AROM;10 reps;Both;Supine Quad Sets: AROM;Both;10 reps;Supine Hip ABduction/ADduction: AROM;Both;10 reps;Supine Straight Leg Raises: AROM;Both;5 reps;Supine Long Arc Quad: Supine;AROM;Both;5 reps Knee Flexion: AROM;Supine;Both;10 reps Other Exercises Other Exercises: therex BLE and BUE AROM from supine; therapist educs re: importance of OOB activity   Shoulder Instructions       General Comments      Pertinent Vitals/ Pain       Pain Assessment: No/denies pain  Home Living  Prior Functioning/Environment              Frequency  Min 2X/week        Progress Toward Goals  OT Goals(current goals can now be found in the care plan section)  Progress towards OT goals: Progressing toward goals  Acute Rehab OT Goals Patient Stated Goal: decrease pain, return home OT Goal Formulation: With patient Time For Goal Achievement: 05/14/20 Potential to Achieve Goals: [place] Discharge plan remains appropriate;Frequency remains appropriate    Co-evaluation                 AM-PAC OT "6 Clicks" Daily Activity     Outcome Measure   Help from another person eating meals?: A Little Help from another person taking care of personal grooming?: A Little Help from another person toileting, which includes using  toliet, bedpan, or urinal?: Total Help from another person bathing (including washing, rinsing, drying)?: A Lot Help from another person to put on and taking off regular upper body clothing?: A Little Help from another person to put on and taking off regular lower body clothing?: Total 6 Click Score: 13    End of Session    OT Visit Diagnosis: Muscle weakness (generalized) (M62.81);Unsteadiness on feet (R26.81)   Activity Tolerance Patient tolerated treatment well;Other (comment) (pt limited 2/2 to fear of getting OOB w/o his sister present)   Patient Left in bed;with call bell/phone within reach;with bed alarm set;with nursing/sitter in room   Nurse Communication          Time: 2979-8921 OT Time Calculation (min): 16 min  Charges: OT General Charges $OT Visit: 1 Visit OT Treatments $Self Care/Home Management : 8-22 mins   Jason Lobo, PhD, MS, OTR/L 05/07/20, 2:49 PM

## 2020-05-07 NOTE — Progress Notes (Signed)
Pulmonary Medicine          Date: 05/07/2020,   MRN# [id number] Jason Fitzgerald 1950/10/20     AdmissionWeight: 74.8 kg                 CurrentWeight: 78.6 kg   Referring physician: Dr Billie Ruddy   CHIEF COMPLAINT:   Acute on chronic hypoxemic respiratory failure in context of pulmonary fibrosis.    HISTORY OF PRESENT ILLNESS   70 yo M w PMH of CHF , COPD, substance abuse , Pulmonary fibrosis, emphysema, chronic hypoxemic respiratory failure, chronic cough who has had recurrent hospitalization for respiratory failure. He has previously been referred to hospice due to comorbid health status however has declined comfort care measures and wishes to continue therapy. He had Transthoracic echo 04/18/20 and it showed severe CHF with EF <58%, RV systolic fucntion is also reduced, there is severe MR. He had CT chest done 12/2019 with findings of basal predominant bilateral pulmonary fibrosis with associated traction bronchiectasis and concomitant upper lobe predominant centriobular emphysema with mild to moderate pleural effusions. Pictorial documentation is included below. Pulmonary consultation placed per patients wishes to have more respiratory workup to help his chronic lung disease.    05/04/20-Patient reports improvement clinically. He feels less dyspnea and wants to try physical therapy more. He has less LE edema.  His urine is dark and renal function worse, discussed with attending physician will hold diuresis. We reviewed incentive spirometry techinique, he is very deconditioned takes volumes up to 350cc only.  He is on room air and BP is low and HR is 74 despite stopping beta blocker and placing on midodrine 10 TID. Cardiology is on case will review regarding re-initiation of beta blockade in lieu of CHF but currently I would favor holding until further guidance due to risk of hypotension.  He is on florinef and prednisone 20 mg due to adrenal insufficiency and interstitial lung disease,  this was started yesterday as salvage therapy for this patient.  We discussed smoking cessation today, he smoked Thursday and states the has not smoked in the hospital which is good.   05/05/20- Patient is sitting up in bed in no distress. He refused blood work today and I discussed this with him.  He worked with [facility] OT service and is improved. Reviewed care plan with Dr Billie Ruddy today.   05/06/2020-patient evaluated at bedside, resting in bed in no acute distress, reports breathing is better..  No acute events overnight.  Repeat blood work noted with significant worsening of his renal impairment.  CBC with differential improved with normalization of WBC count.  SPO2 100% on room air with normal vital signs today.  Ongoing PT and OT. He remains on midodrine has not had diuretics in 24h, appears dry on examination and has increased creatinine today.  I ordered renal evalaution and discussed this with Dr Billie Ruddy.   05/07/20- patient has improved respiratory status but overall his chronic lung disease is severe with combined pulmonary fibrosis and emphysema (CPFE) as well as advanced end stage systolic CHF and renal failure. He is being followed by nephrology and palliative.  His prognosis long term is poor. He is on room air now and speaks in no distress.    PAST MEDICAL HISTORY   Past Medical History:  Diagnosis Date  . CHF (congestive heart failure) ([place])   . Chronic cough   . COPD (chronic obstructive pulmonary disease) ([place])   . Elevated liver function tests   .  Emphysema of lung ([place])   . Fibrosis, pulmonary, interstitial, diffuse ([place])   . GERD (gastroesophageal reflux disease)   . History of cocaine abuse ([place])   . Mediastinal lymphadenopathy   . Pulmonary fibrosis ([place])   . Right inguinal hernia      SURGICAL HISTORY   Past Surgical History:  Procedure Laterality Date  . COLONOSCOPY WITH PROPOFOL N/A 08/19/2018   Procedure: COLONOSCOPY WITH PROPOFOL;  Surgeon: Virgel Manifold, MD;   Location: [facility] ENDOSCOPY;  Service: Endoscopy;  Laterality: N/A;  . HERNIA REPAIR Right 1973   open  . INGUINAL HERNIA REPAIR Right 11/08/2014   Procedure: LAPAROSCOPIC RIGHT INGUINAL HERNIA REPAIR;  Surgeon: Hubbard Robinson, MD;  Location: [facility] ORS;  Service: General;  Laterality: Right;  . knee arthroscopy Right 1972  . RIGHT/LEFT HEART CATH AND CORONARY ANGIOGRAPHY Right 03/05/2017   Procedure: RIGHT/LEFT HEART CATH AND CORONARY ANGIOGRAPHY;  Surgeon: Dionisio David, MD;  Location: [facility];  Service: Cardiovascular;  Laterality: Right;     FAMILY HISTORY   Family History  Problem Relation Age of Onset  . Diabetes Mother   . Cancer Father   . AAA (abdominal aortic aneurysm) Brother      SOCIAL HISTORY   Social History   Tobacco Use  . Smoking status: Never Smoker  . Smokeless tobacco: Never Used  Vaping Use  . Vaping Use: Never used  Substance Use Topics  . Alcohol use: Not Currently    Alcohol/week: 0.0 standard drinks    Comment: 2 quarts a week, he quit again 12/2018  . Drug use: Not Currently    Types: Cocaine    Comment: as an young adult      MEDICATIONS    Home Medication:    Current Medication:  Current Facility-Administered Medications:  .  acetaminophen (TYLENOL) tablet 1,000 mg, 1,000 mg, Oral, TID PRN, Enzo Bi, MD, 1,000 mg at 05/06/20 1233 .  atorvastatin (LIPITOR) tablet 40 mg, 40 mg, Oral, Daily, Cox, Amy N, DO, 40 mg at 05/07/20 0930 .  calcium carbonate (TUMS - dosed in mg elemental calcium) chewable tablet 400 mg of elemental calcium, 400 mg of elemental calcium, Oral, TID WC, Sharen Hones, MD, 400 mg of elemental calcium at 05/07/20 0804 .  collagenase (SANTYL) ointment, , Topical, Daily, Enzo Bi, MD, Given at 05/07/20 [phone]  enoxaparin (LOVENOX) injection 30 mg, 30 mg, Subcutaneous, Q24H, Oswald Hillock, RPH, 30 mg at 05/07/20 3614 .  feeding supplement (NEPRO CARB STEADY) liquid 237 mL, 237 mL, Oral, BID BM, Singh,  Harmeet, MD .  fludrocortisone (FLORINEF) tablet 0.1 mg, 0.1 mg, Oral, Daily, Lanney Gins, Shikita Vaillancourt, MD, 0.1 mg at 05/07/20 0931 .  folic acid (FOLVITE) tablet 1 mg, 1 mg, Oral, Daily, Cox, Amy N, DO, 1 mg at 05/07/20 0931 .  midodrine (PROAMATINE) tablet 10 mg, 10 mg, Oral, TID WC, Cox, Amy N, DO, 10 mg at 05/07/20 0804 .  multivitamin with minerals tablet 1 tablet, 1 tablet, Oral, Daily, Cox, Amy N, DO, 1 tablet at 05/06/20 0937 .  ondansetron (ZOFRAN) tablet 4 mg, 4 mg, Oral, Q6H PRN **OR** ondansetron (ZOFRAN) injection 4 mg, 4 mg, Intravenous, Q6H PRN, Cox, Amy N, DO, 4 mg at 05/04/20 0956 .  polyethylene glycol (MIRALAX / GLYCOLAX) packet 17 g, 17 g, Oral, Daily, Enzo Bi, MD, 17 g at 05/06/20 0936 .  predniSONE (DELTASONE) tablet 10 mg, 10 mg, Oral, Q breakfast, Crayton Savarese, MD, 10 mg at 05/07/20 0804 .  thiamine tablet 100 mg, 100 mg, Oral, Daily, 100 mg at 05/07/20 0930 **OR** thiamine (B-1) injection 100 mg, 100 mg, Intravenous, Daily, Cox, Amy N, DO, 100 mg at 05/02/20 9030    ALLERGIES   Patient has no known allergies.     REVIEW OF SYSTEMS    Review of Systems:  Gen:  Denies  fever, sweats, chills weigh loss  HEENT: Denies blurred vision, double vision, ear pain, eye pain, hearing loss, nose bleeds, sore throat Cardiac:  No dizziness, chest pain or heaviness, chest tightness,edema Resp:   Denies cough or sputum porduction, shortness of breath,wheezing, hemoptysis,  Gi: Denies swallowing difficulty, stomach pain, nausea or vomiting, diarrhea, constipation, bowel incontinence Gu:  Denies bladder incontinence, burning urine Ext:   Denies Joint pain, stiffness or swelling Skin: Denies  skin rash, easy bruising or bleeding or hives Endoc:  Denies polyuria, polydipsia , polyphagia or weight change Psych:   Denies depression, insomnia or hallucinations   Other:  All other systems negative   VS: BP 106/77 (BP Location: Right Arm)   Pulse 86   Temp 98 F (36.7 C) (Oral)    Resp 18   Ht 6' (1.829 m)   Wt 78.6 kg   SpO2 100%   BMI 23.50 kg/m      PHYSICAL EXAM    GENERAL:NAD, no fevers, chills, no weakness no fatigue HEAD: Normocephalic, atraumatic.  EYES: Pupils equal, round, reactive to light. Extraocular muscles intact. No scleral icterus.  MOUTH: Moist mucosal membrane. Dentition intact. No abscess noted.  EAR, NOSE, THROAT: Clear without exudates. No external lesions.  NECK: Supple. No thyromegaly. No nodules. No JVD.  PULMONARY: crepitations bilaterally suggestive of pulmoary fibrosis worse at bases. CARDIOVASCULAR: S1 and S2. Regular rate and rhythm. No murmurs, rubs, or gallops. No edema. Pedal pulses 2+ bilaterally.  GASTROINTESTINAL: Soft, nontender, nondistended. No masses. Positive bowel sounds. No hepatosplenomegaly.  MUSCULOSKELETAL: No swelling, clubbing, or edema. Range of motion full in all extremities.  NEUROLOGIC: Cranial nerves II through XII are intact. No gross focal neurological deficits. Sensation intact. Reflexes intact.  SKIN: No ulceration, lesions, rashes, or cyanosis. Skin warm and dry. Turgor intact.  PSYCHIATRIC: Mood, affect within normal limits. The patient is awake, alert and oriented x 3. Insight, judgment intact.       IMAGING    CT Abdomen Pelvis W Contrast  Result Date: 04/29/2020 CLINICAL DATA:  Acute nonlocalized abdominal pain EXAM: CT ABDOMEN AND PELVIS WITH CONTRAST TECHNIQUE: Multidetector CT imaging of the abdomen and pelvis was performed using the standard protocol following bolus administration of intravenous contrast. CONTRAST:  194mL OMNIPAQUE IOHEXOL 300 MG/ML  SOLN COMPARISON:  07/20/2013 FINDINGS: Lower chest: Small bilateral pleural effusion. Fibrosis with honeycombing is seen diffusely at the lung bases. Heart size is within normal limits. Gynecomastia. Hepatobiliary: Heterogeneous density of the liver with nutmeg appearance. Subcapsular right hepatic cyst. No solid masslike finding.Calcification at  the gallbladder fossa which appears extraluminal and is chronic. No evidence of biliary inflammation or obstruction. Pancreas: Unremarkable. Spleen: Unremarkable. Adrenals/Urinary Tract: Negative adrenals. No hydronephrosis or stone. Small renal cystic densities. Unremarkable bladder. Stomach/Bowel:  No obstruction. No visible inflammation of bowel. Vascular/Lymphatic: No acute vascular abnormality. Scattered atheromatous calcification of the aorta. No mass or adenopathy. Reproductive:Partially covered right hydrocele Other: No ascites or pneumoperitoneum.  Hernia repair using mesh. Musculoskeletal: No acute abnormalities.  Lumbar spine degeneration. IMPRESSION: 1. No acute intra-abdominal finding. 2. Heterogeneous liver perfusion which could be from fibrosis or right heart failure. 3. Small pleural  effusions. 4.  Aortic Atherosclerosis (ICD10-I70.0). Electronically Signed   By: Monte Fantasia M.D.   On: 04/29/2020 11:31   US RENAL  Result Date: 04/20/2020 CLINICAL DATA:  AK I EXAM: RENAL / URINARY TRACT ULTRASOUND COMPLETE COMPARISON:  None. FINDINGS: Right Kidney: Renal measurements: 11.3 x 5.3 x 6.0 cm = volume: 187 mL. Echogenicity within normal limits. No mass or hydronephrosis visualized. Left Kidney: Renal measurements: 11.7 x 5.5 x 5.8 cm = volume: 194 mL. Echogenicity within normal limits. No mass or hydronephrosis visualized. Bladder: Decompressed with Foley catheter in place. Other: None. IMPRESSION: Unremarkable sonographic appearance of the bilateral kidneys. Electronically Signed   By: Audie Pinto M.D.   On: 04/20/2020 11:49   DG Chest Port 1 View  Result Date: 04/29/2020 CLINICAL DATA:  Shortness of breath and pain. EXAM: PORTABLE CHEST 1 VIEW COMPARISON:  04/18/2020 FINDINGS: Stable cardiomediastinal contours. Extensive fibrotic lung disease is identified within both lungs. This is most advanced within the mid and lower lung zones. No definite superimposed pulmonary opacity. There is a  suggestion of subcutaneous gas within the soft tissues in the left supraclavicular region. IMPRESSION: 1. Extensive fibrotic lung disease. 2. No superimposed acute cardiopulmonary abnormality. Electronically Signed   By: Kerby Moors M.D.   On: 04/29/2020 10:43   DG Chest Port 1 View  Result Date: 04/18/2020 CLINICAL DATA:  Chest pain EXAM: PORTABLE CHEST 1 VIEW COMPARISON:  December 08, 2019 chest radiograph; chest CT December 09, 2019 FINDINGS: There is extensive fibrosis throughout the lungs, most severely in the mid and lower lung regions. There are small pleural effusions bilaterally. The heart size is within normal limits. Pulmonary vascularity is within normal limits. Lymph node prominence noted on prior CT is not well delineated radiography. No bone lesions. IMPRESSION: Extensive underlying fibrosis with small pleural effusions bilaterally. No appreciable new opacity compared to prior studies. Stable cardiac silhouette. Electronically Signed   By: Lowella Grip III M.D.   On: 04/18/2020 09:04   ECHOCARDIOGRAM COMPLETE  Result Date: 04/19/2020    ECHOCARDIOGRAM REPORT   Patient Name:   Jason Fitzgerald Date of Exam: 04/18/2020 Medical Rec #:  [id number]       Height:       69.0 in Accession #:    [id number]      Weight:       171.0 lb Date of Birth:  April 02, 1950      BSA:          1.933 m Patient Age:    64 years        BP:           100/68 mmHg Patient Gender: M               HR:           94 bpm. Exam Location:  [facility] Procedure: 2D Echo, Cardiac Doppler and Color Doppler Indications:     CHF-acute systolic W29.56  History:         Patient has prior history of Echocardiogram examinations, most                  recent 12/09/2019. CHF; COPD.  Sonographer:     Sherrie Sport RDCS (AE) Referring Phys:  [id number] [facility] Diagnosing Phys: Neoma Laming MD  Sonographer Comments: Suboptimal parasternal window. IMPRESSIONS  1. Left ventricular ejection fraction, by estimation, is <20%. The left ventricle has  severely decreased function. The left ventricle demonstrates global hypokinesis. The left ventricular internal  cavity size was severely dilated. There is mild concentric left ventricular hypertrophy. Left ventricular diastolic parameters are consistent with Grade II diastolic dysfunction (pseudonormalization).  2. Right ventricular systolic function is mildly reduced. The right ventricular size is moderately enlarged.  3. Left atrial size was severely dilated.  4. Right atrial size was severely dilated.  5. 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 No evidence of mitral stenosis.  6. The aortic valve is grossly normal. Aortic valve regurgitation is not visualized. Mild aortic valve sclerosis is present, with no evidence of aortic valve stenosis. FINDINGS  Left Ventricle: Left ventricular ejection fraction, by estimation, is <20%. The left ventricle has severely decreased function. The left ventricle demonstrates global hypokinesis. The left ventricular internal cavity size was severely dilated. There is mild concentric left ventricular hypertrophy. Left ventricular diastolic parameters are consistent with Grade II diastolic dysfunction (pseudonormalization). Right Ventricle: The right ventricular size is moderately enlarged. No increase in right ventricular wall thickness. Right ventricular systolic function is mildly reduced. Left Atrium: Left atrial size was severely dilated. Right Atrium: Right atrial size was severely dilated. Pericardium: There is no evidence of pericardial effusion. Mitral Valve: 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 No evidence of mitral valve stenosis. Tricuspid Valve: The tricuspid valve is grossly normal. Tricuspid valve regurgitation is mild. Aortic Valve: The aortic valve is grossly normal. Aortic valve regurgitation is not visualized. Mild aortic valve sclerosis is present, with no evidence of aortic valve stenosis. Aortic valve mean  gradient measures 1.0 mmHg. Aortic valve peak gradient measures 1.7 mmHg. Aortic valve area, by VTI measures 1.85 cm. Pulmonic Valve: The pulmonic valve was normal in structure. Pulmonic valve regurgitation is trivial. Aorta: The aortic root, ascending aorta and aortic arch are all structurally normal, with no evidence of dilitation or obstruction. IAS/Shunts: The interatrial septum was not assessed.  LEFT VENTRICLE PLAX 2D LVIDd:         5.57 cm LVIDs:         5.01 cm LV PW:         0.90 cm LV IVS:        0.78 cm LVOT diam:     2.10 cm LV SV:         13 LV SV Index:   7 LVOT Area:     3.46 cm  LV Volumes (MOD) LV vol d, MOD A2C: 115.0 ml LV vol d, MOD A4C: 194.0 ml LV vol s, MOD A2C: 91.0 ml LV vol s, MOD A4C: 154.0 ml LV SV MOD A2C:     24.0 ml LV SV MOD A4C:     194.0 ml LV SV MOD BP:      27.8 ml RIGHT VENTRICLE RV S prime:     9.79 cm/s TAPSE (M-mode): 3.7 cm LEFT ATRIUM             Index       RIGHT ATRIUM           Index LA diam:        4.30 cm 2.22 cm/m  RA Area:     16.10 cm LA Vol (A2C):   84.0 ml 43.46 ml/m RA Volume:   43.50 ml  22.50 ml/m LA Vol (A4C):   56.0 ml 28.97 ml/m LA Biplane Vol: 69.5 ml 35.96 ml/m  AORTIC VALVE                   PULMONIC VALVE AV Area (Vmax):  1.41 cm    PV Vmax:        0.18 m/s AV Area (Vmean):   1.72 cm    PV Peak grad:   0.1 mmHg AV Area (VTI):     1.85 cm    RVOT Peak grad: 1 mmHg AV Vmax:           66.00 cm/s AV Vmean:          42.200 cm/s AV VTI:            0.072 m AV Peak Grad:      1.7 mmHg AV Mean Grad:      1.0 mmHg LVOT Vmax:         26.80 cm/s LVOT Vmean:        21.000 cm/s LVOT VTI:          0.039 m LVOT/AV VTI ratio: 0.54  AORTA Ao Root diam: 2.83 cm MITRAL VALVE                TRICUSPID VALVE MV Area (PHT): 5.16 cm     TR Peak grad:   32.7 mmHg MV Decel Time: 147 msec     TR Vmax:        286.00 cm/s MV E velocity: 118.00 cm/s                             SHUNTS                             Systemic VTI:  0.04 m                             Systemic  Diam: 2.10 cm Neoma Laming MD Electronically signed by Neoma Laming MD Signature Date/Time: 04/19/2020/8:09:07 AM    Final       ASSESSMENT/PLAN    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  Will dc cefepime. continue zithromax only 250 PO.     - prednisone 10 mg po daily and florinef .1 tid      Acute on chronic systolic CHF   - he has 1+ LE edema but he has effusions bilaterally  -S/P Renal evaluation - appreciate input - holding diuresis, dc nephrotoxins no HD for now   Cardiorenal syndrome  -renal consult - appreciate input  - palliative on case appreciate input    Severe protein calorie malnutrition  bitemporal wasting and peripheral muscle wasting.  -Nutrition and PT/OT   Thank you for allowing me to participate in the care of this patient.   Patient/Family are satisfied with care plan and all questions have been answered.  This document was prepared using Dragon voice recognition software and may include unintentional dictation errors.     Ottie Glazier, M.D.  Division of [facility]

## 2020-05-07 NOTE — Progress Notes (Signed)
[facility]  ROUNDING NOTE   Subjective:   Jason Fitzgerald is a 70 year old male with past medical history of interstitial lung disease, heart failure with reduced ejection fraction, hypertension, hyperlipidemia, and A. fib.  He presents to the emergency room with complaints of shortness of breath.  He is admitted for possible volume overload related to heart failure.  We have been consulted due to worsening renal function.  In the setting of diuresing and IV contrast, patient's creatinine has increased.  He is seen this morning resting in bed eating breakfast.  Daughter at the bedside.  Daughter states his appetite has decreased over the past few days.  He is currently on a pured diet here in the hospital.  He denies nausea, stomach upset, and shortness of breath.  Objective:  Vital signs in last 24 hours:  Temp:  [97.4 F (36.3 C)-98.1 F (36.7 C)] 97.6 F (36.4 C) (04/05 1134) Pulse Rate:  [85-92] 92 (04/05 1134) Resp:  [18-24] 18 (04/05 0752) BP: (99-107)/(66-77) 104/74 (04/05 1134) SpO2:  [96 %-100 %] 96 % (04/05 1134)  Weight change:  Filed Weights   05/04/20 0650 05/05/20 0500 05/06/20 0412  Weight: 78.4 kg 81.7 kg 78.6 kg    Intake/Output: I/O last 3 completed shifts: In: 120 [P.O.:120] Out: 125 [Urine:125]   Intake/Output this shift:  Total I/O In: 120 [P.O.:120] Out: -   Physical Exam: General: NAD   Head: Normocephalic, atraumatic. Moist oral mucosal membranes  Lungs:  Clear to auscultation  Heart: Regular rate and rhythm  Abdomen:  Soft, nontender,   Extremities:  trace peripheral edema.  Neurologic: Alert, able to answer simple questions  Skin: No lesions    Basic Metabolic Panel: Recent Labs  Lab 05/01/20 0528 05/02/20 0715 05/03/20 0809 05/04/20 0429 05/05/20 2037 05/06/20 0843 05/07/20 0851  NA 134*   < > 133* 133* 134* 136 134*  K 3.1*   < > 3.6 3.7 4.5 3.7 3.9  CL 94*   < > 97* 96* 97* 99 97*  CO2 26   < > 22 24 21* 25 23   GLUCOSE 108*   < > 102* 113* 131* 115* 126*  BUN 32*   < > 44* 50* 65* 72* 82*  CREATININE 0.89   < > 1.39* 1.71* 2.41* 2.73* 3.04*  CALCIUM 7.6*   < > 8.4* 8.4* 8.5* 8.7* 8.7*  MG 1.7   < > 2.1 2.4 2.6* 2.7* 2.9*  PHOS 2.5  --   --   --   --   --   --    < > = values in this interval not displayed.    Liver Function Tests: Recent Labs  Lab 05/01/20 0528  AST 24  ALT 25  ALKPHOS 107  BILITOT 2.7*  PROT 6.0*  ALBUMIN 2.8*   No results for input(s): LIPASE, AMYLASE in the last 168 hours. No results for input(s): AMMONIA in the last 168 hours.  CBC: Recent Labs  Lab 05/01/20 0528 05/02/20 0715 05/03/20 0809 05/04/20 0429 05/05/20 2037 05/06/20 0728 05/07/20 0851  WBC 10.6*   < > 10.0 10.5 9.1 10.5 7.1  NEUTROABS 8.6*  --   --   --   --   --   --   HGB 11.5*   < > 11.9* 11.3* 12.0* 11.4* 11.2*  HCT 34.3*   < > 33.9* 33.0* 35.2* 32.9* 33.1*  MCV 81.3   < > 78.1* 78.8* 78.7* 77.8* 78.6*  PLT 115*   < >  168 172 196 221 184   < > = values in this interval not displayed.    Cardiac Enzymes: No results for input(s): CKTOTAL, CKMB, CKMBINDEX, TROPONINI in the last 168 hours.  BNP: Invalid input(s): POCBNP  CBG: No results for input(s): GLUCAP in the last 168 hours.  Microbiology: Results for orders placed or performed during the hospital encounter of 04/29/20  Culture, blood (routine x 2)     Status: None   Collection Time: 04/29/20  9:34 AM   Specimen: BLOOD  Result Value Ref Range Status   Specimen Description BLOOD RIGHT ARM  Final   Special Requests   Final    BOTTLES DRAWN AEROBIC AND ANAEROBIC Blood Culture adequate volume   Culture   Final    NO GROWTH 5 DAYS Performed at [facility], [address], [place], [place] [address]    Report Status 05/04/2020 FINAL  Final  Culture, blood (routine x 2)     Status: None   Collection Time: 04/29/20  9:34 AM   Specimen: BLOOD  Result Value Ref Range Status   Specimen Description BLOOD BLOOD LEFT  ARM  Final   Special Requests   Final    BOTTLES DRAWN AEROBIC AND ANAEROBIC Blood Culture adequate volume   Culture   Final    NO GROWTH 5 DAYS Performed at [facility], [address], [place],  [address]    Report Status 05/04/2020 FINAL  Final  SARS CORONAVIRUS 2 (TAT 6-24 HRS) Nasopharyngeal Nasopharyngeal Swab     Status: None   Collection Time: 04/29/20  9:40 AM   Specimen: Nasopharyngeal Swab  Result Value Ref Range Status   SARS Coronavirus 2 NEGATIVE NEGATIVE Final    Comment: (NOTE) SARS-CoV-2 target nucleic acids are NOT DETECTED.  The SARS-CoV-2 RNA is generally detectable in upper and lower respiratory specimens during the acute phase of infection. Negative results do not preclude SARS-CoV-2 infection, do not rule out co-infections with other pathogens, and should not be used as the sole basis for treatment or other patient management decisions. Negative results must be combined with clinical observations, patient history, and epidemiological information. The expected result is Negative.  Fact Sheet for Patients: SugarRoll.be  Fact Sheet for Healthcare Providers: https://www.woods-mathews.com/  This test is not yet approved or cleared by the [place] FDA and  has been authorized for detection and/or diagnosis of SARS-CoV-2 by FDA under an Emergency Use Authorization (EUA). This EUA will remain  in effect (meaning this test can be used) for the duration of the COVID-19 declaration under Se ction 564(b)(1) of the Act, 21 U.S.C. section 360bbb-3(b)(1), unless the authorization is terminated or revoked sooner.  Performed at [facility], [place] [address], [place],  [address]     Coagulation Studies: No results for input(s): LABPROT, INR in the last 72 hours.  Urinalysis: No results for input(s): COLORURINE, LABSPEC, PHURINE, GLUCOSEU, HGBUR, BILIRUBINUR, KETONESUR, PROTEINUR,  UROBILINOGEN, NITRITE, LEUKOCYTESUR in the last 72 hours.  Invalid input(s): APPERANCEUR    Imaging: No results found.   Medications:    . atorvastatin  40 mg Oral Daily  . calcium carbonate  400 mg of elemental calcium Oral TID WC  . collagenase   Topical Daily  . enoxaparin (LOVENOX) injection  30 mg Subcutaneous Q24H  . feeding supplement (NEPRO CARB STEADY)  237 mL Oral BID BM  . fludrocortisone  0.1 mg Oral Daily  . folic acid  1 mg Oral Daily  . midodrine  10 mg Oral TID WC  . multivitamin with minerals  1 tablet Oral Daily  . polyethylene glycol  17 g Oral Daily  . predniSONE  10 mg Oral Q breakfast  . thiamine  100 mg Oral Daily   Or  . thiamine  100 mg Intravenous Daily   acetaminophen, ondansetron **OR** ondansetron (ZOFRAN) IV  Assessment/ Plan:  Mr. MARSHAWN NORMOYLE is a 70 y.o.  male with past medical history of interstitial lung disease, heart failure with reduced ejection fraction, hypertension, hyperlipidemia, and A. fib.   1. Acute Kidney Injury baseline creatinine 0.64 on March 29.  Acute kidney injury secondary to IV contrast and diuresis.  Current level 3.04 Avoid nephrotoxic medications and treatments Hold diuretics Encourage oral nutrition No acute need for dialysis at this time Continue Midodrine to prevent hypotension  Lab Results  Component Value Date   CREATININE 3.04 (H) 05/07/2020   CREATININE 2.73 (H) 05/06/2020   CREATININE 2.41 (H) 05/05/2020    Intake/Output Summary (Last 24 hours) at 05/07/2020 1412 Last data filed at 05/07/2020 0940 Gross per 24 hour  Intake 120 ml  Output 125 ml  Net -5 ml   2. Acute on Chronic Heart failure with EF <20% Refused LifeVest and ran out of medications at home DNR status Given Lasix in ED ECHO 04/19/20-severely decreased function of left ventricle, EF <20, and global hypokinesis.Grade 2 diastolic dysfunction.   3. Shortness of breath Lasix in ED Currently on supplemental oxygen    LOS:  7 Rashell Shambaugh 4/5/20222:12 PM

## 2020-05-07 NOTE — Progress Notes (Signed)
PROGRESS NOTE    Jason Fitzgerald  FUX:[id number] DOB: [birth year] DOA: 04/29/2020 PCP: Steele Sizer, MD   Chief complaint.  Shortness of breath. Brief Narrative:  Jason Fitzgerald is a 70 y.o. male with medical history significant for hypertension, interstitial lung disease, heart failure reduced ejection fraction, hyperlipidemia, history of atrial fibrillation, presents emergency department by EMS for shortness of breath.  Patient condition has been progressively getting worse over the last week, he has a severe short of breath with minimal exertion, he has been sleeping in a recliner, frequent paroxysmal nocturnal dyspnea.  He also drinks alcohol and he used cocaine. Upon arriving the hospital, he was hypotensive.     Assessment & Plan:   Principal Problem:   Shortness of breath Active Problems:   Arteriosclerosis of coronary artery   HTN (hypertension)   Interstitial lung disease (HCC)   Chronic cough   Degenerative arthritis of lumbar spine   Chronic systolic CHF (congestive heart failure) (HCC)   AF (paroxysmal atrial fibrillation) (HCC)   Rheumatoid arthritis involving multiple sites with positive rheumatoid factor (HCC)   Fibrosis, pulmonary, interstitial, diffuse (HCC)   GERD (gastroesophageal reflux disease)   COPD (chronic obstructive pulmonary disease) (HCC)   CHF (congestive heart failure) (HCC)   Anemia   Atrial fibrillation (HCC)   Non compliance w medication regimen   DNR (do not resuscitate)   Prolonged QT interval   Acute on chronic systolic (congestive) heart failure (HCC)   Sepsis ([place])   Aspiration pneumonia of both lower lobes due to gastric secretions (HCC)  Goals of care --Palliative care was involved during last hospitalization and pt reportedly elected to be DNR.  However, family asked for pt to be switched back to Full Code during this hospitalization.  --palliative care consulted during this hospitalization   AKI, not POA --Cr 0.99 on  presentation, trending up to 2.73 this morning. --likely due to diuresis while intravascularly depleted, hypotension --nephrology consulted Plan: --Hold diuresis and Farxiga --cont midodrine and avoid hypotension  #1.  Acute on chronic systolic congestive heart failure. --Patient has evidence of volume overload, significant paroxysmal nocturnal dyspnea, leg edema.  He has intermittent hypotension due to severe LV dysfunction.  Ejection fraction less than 20%. --Patient overall condition appears to be end-stage congestive heart failure.  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 --transition from lasix gtt to oral lasix 20 mg BID, then held due to AKI.   Plan: --Hold diuresis due to worsening AKI and pt currently not hypoxic --Hold Farxiga 2/2 AKI --Hold BB due to hypotension, per pulm --Hold Entrestod/t hypotension.  --encourage oral hydration   Paroxysmal atrial fibrillation. --currently rate controlled --Due to pulmonary fibrosis, cannot be placed on amiodarone. 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 Severe hypoalbuminemia. Intermittent hypotension. Coagulopathy secondary to liver failure. Patient also has liver function changes with elevated bilirubin.  This is due to right-sided congestive heart failure. Patient condition is very serious, very high risk of mortality.  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 --consulted pulm, started on Florinef and predniosne --cont Florinef 0.1 mg daily --cont prednisone 10 mg daily, per pulm  #2.  Severe sepsis. Aspiration pneumonia. Patient met sepsis criteria at time admission, he had tachycardia, tachypnea, hypotension, leukocytosis.  He also has significant lactic acidosis. Due to alcohol drinking,  patient has high  risk of aspiration pneumonia.  Patient also has Pseudomonas risk due to interstitial lung disease.   --procal elevated --finished 5 days of cefepime  4.  Alcohol abuse. Cocaine abuse. --no alcohol withdrawal --Continue thiamine and folic acid.  #6.  Severe protein calorie malnutrition. Poor oral intake and possible swallow issues Patient had significant muscle atrophy, looks malnourished.   --refused all Ensure supplement offered.  Said that he had to be on it for years and got sick of it. Plan: --SLP, rec Dys 1  #7.  Hypokalemia. --monitor and replete PRN  Constipation, resolved --cont Miralax 17g daily  Unstageable ulcers over left shoulder, sacral areas and bilateral buttocks, POA --wound care consulted, recommended surgical consult --Surgery consult on 3/31, does not recommend debridement.  Whole-body pain --avoid narcotics --can't give NSAIDs 2/2 AKI --Tylenol 1g TID PRN --can apply lidocaine patch if pt can localize pain   DVT prophylaxis: lovenox Code Status: Family requested change back to Full code during current hospitalization, wants to continue full treatment Family Communication:  Disposition Plan:   Status is: inpatient   Dispo: The patient is from: Home              Anticipated d/c is to: SNF               Patient currently is not medically stable to d/c.  AKI worsening    Difficult to place patient No    I/O last 3 completed shifts: In: 120 [P.O.:120] Out: 125 [Urine:125] Total I/O In: 600 [P.O.:600] Out: 1300 [Urine:1300]     Consultants:   Cardiology  Procedures: None  Antimicrobials: Cefepime  Subjective: Pt was looking more comfortable and animated this morning, but started complaining of dyspnea and chest tightness again later afternoon.  HDS, sating well.   Objective: Vitals:   05/06/20 2105 05/07/20 0426 05/07/20 0752 05/07/20 1134  BP: 99/66 107/72 106/77 104/74  Pulse: 85 88 86 92  Resp: _0 Temp: 97.6 F (36.4 C) 98.1 F (36.7 C) 98 F (36.7 C) 97.6 F (36.4 C)  TempSrc: Oral  Oral Oral  SpO2: 100% 100% 100% 96%  Weight:      Height:        Intake/Output Summary (Last 24 hours) at 05/07/2020 1524 Last data filed at 05/07/2020 1504 Gross per 24 hour  Intake 600 ml  Output 1425 ml  Net -825 ml   Filed Weights   05/04/20 0650 05/05/20 0500 05/06/20 0412  Weight: 78.4 kg 81.7 kg 78.6 kg    Examination:  Constitutional: NAD, alert, oriented to self and place, more talkative today HEENT: conjunctivae and lids normal, EOMI CV: No cyanosis.   RESP: normal respiratory effort, on RA Extremities: Mild edema in BLE SKIN: warm, dry Neuro: II - XII grossly intact.     Data Reviewed: I have personally reviewed following labs and imaging studies  CBC: Recent Labs  Lab 05/01/20 0528 05/02/20 0715 05/03/20 0809 05/04/20 0429 05/05/20 2037 05/06/20 0728 05/07/20 0851  WBC 10.6*   < > 10.0 10.5 9.1 10.5 7.1  NEUTROABS 8.6*  --   --   --   --   --   --   HGB 11.5*   < > 11.9* 11.3* 12.0* 11.4* 11.2*  HCT 34.3*   < > 33.9* 33.0* 35.2* 32.9* 33.1*  MCV 81.3   < > 78.1* 78.8* 78.7* 77.8* 78.6*  PLT 115*   < > 168 172 196 221 184   < > =  values in this interval not displayed.   Basic Metabolic Panel: Recent Labs  Lab 05/01/20 0528 05/02/20 0715 05/03/20 0809 05/04/20 0429 05/05/20 2037 05/06/20 0843 05/07/20 0851  NA 134*   < > 133* 133* 134* 136 134*  K 3.1*   < > 3.6 3.7 4.5 3.7 3.9  CL 94*   < > 97* 96* 97* 99 97*  CO2 26   < > 22 24 21* 25 23  GLUCOSE 108*   < > 102* 113* 131* 115* 126*  BUN 32*   < > 44* 50* 65* 72* 82*  CREATININE 0.89   < > 1.39* 1.71* 2.41* 2.73* 3.04*  CALCIUM 7.6*   < > 8.4* 8.4* 8.5* 8.7* 8.7*  MG 1.7   < > 2.1 2.4 2.6* 2.7* 2.9*  PHOS 2.5  --   --   --   --   --   --    < > = values in this interval not displayed.   GFR: Estimated Creatinine Clearance: 25.5 mL/min (A) (by C-G formula based on SCr of 3.04 mg/dL (H)). Liver  Function Tests: Recent Labs  Lab 05/01/20 0528  AST 24  ALT 25  ALKPHOS 107  BILITOT 2.7*  PROT 6.0*  ALBUMIN 2.8*   No results for input(s): LIPASE, AMYLASE in the last 168 hours. No results for input(s): AMMONIA in the last 168 hours. Coagulation Profile: No results for input(s): INR, PROTIME in the last 168 hours. Cardiac Enzymes: No results for input(s): CKTOTAL, CKMB, CKMBINDEX, TROPONINI in the last 168 hours. BNP (last 3 results) No results for input(s): PROBNP in the last 8760 hours. HbA1C: No results for input(s): HGBA1C in the last 72 hours. CBG: No results for input(s): GLUCAP in the last 168 hours. Lipid Profile: No results for input(s): CHOL, HDL, LDLCALC, TRIG, CHOLHDL, LDLDIRECT in the last 72 hours. Thyroid Function Tests: No results for input(s): TSH, T4TOTAL, FREET4, T3FREE, THYROIDAB in the last 72 hours. Anemia Panel: No results for input(s): VITAMINB12, FOLATE, FERRITIN, TIBC, IRON, RETICCTPCT in the last 72 hours. Sepsis Labs: Recent Labs  Lab 05/01/20 0528  PROCALCITON 3.97    Recent Results (from the past 240 hour(s))  Culture, blood (routine x 2)     Status: None   Collection Time: 04/29/20  9:34 AM   Specimen: BLOOD  Result Value Ref Range Status   Specimen Description BLOOD RIGHT ARM  Final   Special Requests   Final    BOTTLES DRAWN AEROBIC AND ANAEROBIC Blood Culture adequate volume   Culture   Final    NO GROWTH 5 DAYS Performed at [facility], [address], [place], [place] [address]    Report Status 05/04/2020 FINAL  Final  Culture, blood (routine x 2)     Status: None   Collection Time: 04/29/20  9:34 AM   Specimen: BLOOD  Result Value Ref Range Status   Specimen Description BLOOD BLOOD LEFT ARM  Final   Special Requests   Final    BOTTLES DRAWN AEROBIC AND ANAEROBIC Blood Culture adequate volume   Culture   Final    NO GROWTH 5 DAYS Performed at [facility], [address] [place], [place] [address]     Report Status 05/04/2020 FINAL  Final  SARS CORONAVIRUS 2 (TAT 6-24 HRS) Nasopharyngeal Nasopharyngeal Swab     Status: None   Collection Time: 04/29/20  9:40 AM   Specimen: Nasopharyngeal Swab  Result Value Ref Range Status   SARS Coronavirus  2 NEGATIVE NEGATIVE Final    Comment: (NOTE) SARS-CoV-2 target nucleic acids are NOT DETECTED.  The SARS-CoV-2 RNA is generally detectable in upper and lower respiratory specimens during the acute phase of infection. Negative results do not preclude SARS-CoV-2 infection, do not rule out co-infections with other pathogens, and should not be used as the sole basis for treatment or other patient management decisions. Negative results must be combined with clinical observations, patient history, and epidemiological information. The expected result is Negative.  Fact Sheet for Patients: SugarRoll.be  Fact Sheet for Healthcare Providers: https://www.woods-mathews.com/  This test is not yet approved or cleared by the [place] FDA and  has been authorized for detection and/or diagnosis of SARS-CoV-2 by FDA under an Emergency Use Authorization (EUA). This EUA will remain  in effect (meaning this test can be used) for the duration of the COVID-19 declaration under Se ction 564(b)(1) of the Act, 21 U.S.C. section 360bbb-3(b)(1), unless the authorization is terminated or revoked sooner.  Performed at [facility], [place] [address] [place], [place] [address]          Radiology Studies: No results found.      Scheduled Meds: . atorvastatin  40 mg Oral Daily  . calcium carbonate  400 mg of elemental calcium Oral TID WC  . collagenase   Topical Daily  . enoxaparin (LOVENOX) injection  30 mg Subcutaneous Q24H  . feeding supplement (NEPRO CARB STEADY)  237 mL Oral BID BM  . fludrocortisone  0.1 mg Oral Daily  . folic acid  1 mg Oral Daily  . midodrine  10 mg Oral TID WC  . multivitamin  with minerals  1 tablet Oral Daily  . polyethylene glycol  17 g Oral Daily  . predniSONE  10 mg Oral Q breakfast  . thiamine  100 mg Oral Daily   Or  . thiamine  100 mg Intravenous Daily   Continuous Infusions:    LOS: 7 days     Enzo Bi, MD [facility]   To contact the attending provider between 7A-7P or the covering provider during after hours 7P-7A, please log into the web site [email or web address] and access using universal [place] password for that web site. If you do not have the password, please call the hospital operator.  05/07/2020, 3:24 PM

## 2020-05-08 DIAGNOSIS — R0602 Shortness of breath: Secondary | ICD-10-CM | POA: Diagnosis not present

## 2020-05-08 DIAGNOSIS — Z7189 Other specified counseling: Secondary | ICD-10-CM | POA: Diagnosis not present

## 2020-05-08 LAB — CBC
HCT: 36.9 % — ABNORMAL LOW (ref 39.0–52.0)
Hemoglobin: 12.2 g/dL — ABNORMAL LOW (ref 13.0–17.0)
MCH: 26.8 pg (ref 26.0–34.0)
MCHC: 33.1 g/dL (ref 30.0–36.0)
MCV: 81.1 fL (ref 80.0–100.0)
Platelets: 149 10*3/uL — ABNORMAL LOW (ref 150–400)
RBC: 4.55 MIL/uL (ref 4.22–5.81)
RDW: 16.1 % — ABNORMAL HIGH (ref 11.5–15.5)
WBC: 10.3 10*3/uL (ref 4.0–10.5)
nRBC: 0.8 % — ABNORMAL HIGH (ref 0.0–0.2)

## 2020-05-08 LAB — MAGNESIUM: Magnesium: 3.2 mg/dL — ABNORMAL HIGH (ref 1.7–2.4)

## 2020-05-08 LAB — BASIC METABOLIC PANEL
Anion gap: 17 — ABNORMAL HIGH (ref 5–15)
BUN: 89 mg/dL — ABNORMAL HIGH (ref 8–23)
CO2: 18 mmol/L — ABNORMAL LOW (ref 22–32)
Calcium: 8.6 mg/dL — ABNORMAL LOW (ref 8.9–10.3)
Chloride: 100 mmol/L (ref 98–111)
Creatinine, Ser: 3.05 mg/dL — ABNORMAL HIGH (ref 0.61–1.24)
GFR, Estimated: 22 mL/min — ABNORMAL LOW (ref >60)
Glucose, Bld: 106 mg/dL — ABNORMAL HIGH (ref 70–99)
Potassium: 4.3 mmol/L (ref 3.5–5.1)
Sodium: 135 mmol/L (ref 135–145)

## 2020-05-08 NOTE — Progress Notes (Signed)
SUBJECTIVE: Patient up in the chair. States his breathing has improved. On room air. Denies chest pain.   Vitals:   05/07/20 1905 05/08/20 0408 05/08/20 0712 05/08/20 1213  BP: (!) 112/99 104/84 106/84 110/79  Pulse: (!) 108 89 79 86  Resp: 17 18 17 18   Temp: 97.9 F (36.6 C) 97.8 F (36.6 C) 98 F (36.7 C) 98.1 F (36.7 C)  TempSrc:  Oral  Oral  SpO2: 97% 100% 96% 95%  Weight:  78.5 kg    Height:        Intake/Output Summary (Last 24 hours) at 05/08/2020 1307 Last data filed at 05/08/2020 1214 Gross per 24 hour  Intake 480 ml  Output 1600 ml  Net -1120 ml    LABS: Basic Metabolic Panel: Recent Labs    05/07/20 0851 05/08/20 0853  NA 134* 135  K 3.9 4.3  CL 97* 100  CO2 23 18*  GLUCOSE 126* 106*  BUN 82* 89*  CREATININE 3.04* 3.05*  CALCIUM 8.7* 8.6*  MG 2.9* 3.2*   Liver Function Tests: No results for input(s): AST, ALT, ALKPHOS, BILITOT, PROT, ALBUMIN in the last 72 hours. No results for input(s): LIPASE, AMYLASE in the last 72 hours. CBC: Recent Labs    05/07/20 0851 05/08/20 0853  WBC 7.1 10.3  HGB 11.2* 12.2*  HCT 33.1* 36.9*  MCV 78.6* 81.1  PLT 184 149*   Cardiac Enzymes: No results for input(s): CKTOTAL, CKMB, CKMBINDEX, TROPONINI in the last 72 hours. BNP: Invalid input(s): POCBNP D-Dimer: No results for input(s): DDIMER in the last 72 hours. Hemoglobin A1C: No results for input(s): HGBA1C in the last 72 hours. Fasting Lipid Panel: No results for input(s): CHOL, HDL, LDLCALC, TRIG, CHOLHDL, LDLDIRECT in the last 72 hours. Thyroid Function Tests: No results for input(s): TSH, T4TOTAL, T3FREE, THYROIDAB in the last 72 hours.  Invalid input(s): FREET3 Anemia Panel: No results for input(s): VITAMINB12, FOLATE, FERRITIN, TIBC, IRON, RETICCTPCT in the last 72 hours.   PHYSICAL EXAM General: Well developed, well nourished, in no acute distress HEENT:  Normocephalic and atramatic Neck:  No JVD.  Lungs: Crepitations bibasilar Heart: HRRR .  Normal S1 and S2 without gallops or murmurs.  Abdomen: Bowel sounds are positive, abdomen soft and non-tender  Msk:  Generalized weakness. Extremities: No clubbing, cyanosis or +1 pitting edema BLE.   Neuro: Alert and oriented X 3. Psych:  Good affect, responds appropriately  ASSESSMENT AND PLAN: Patient presenting to the emergency department with worsening dyspnea.Currently in sinus rhythm and rate controlled.Respiratory status improved, continues to be on RA. Pulmonology following for pulmonary fibrosis management. Farxiga dc'd d/t AKI. Wilder Glade does have CKD and HFrEF indications and now cannot be restarted until kidney function improves. D/t hypotension, continue to hold entresto and metoprolol. Palliative on board for endstage HFrEF and ILD.    Principal Problem:   Shortness of breath Active Problems:   Arteriosclerosis of coronary artery   HTN (hypertension)   Interstitial lung disease (HCC)   Chronic cough   Degenerative arthritis of lumbar spine   Chronic systolic CHF (congestive heart failure) (HCC)   AF (paroxysmal atrial fibrillation) (HCC)   Rheumatoid arthritis involving multiple sites with positive rheumatoid factor (HCC)   Fibrosis, pulmonary, interstitial, diffuse (HCC)   GERD (gastroesophageal reflux disease)   COPD (chronic obstructive pulmonary disease) (HCC)   CHF (congestive heart failure) (HCC)   Anemia   Atrial fibrillation (HCC)   Non compliance w medication regimen   DNR (do not resuscitate)  Prolonged QT interval   Acute on chronic systolic (congestive) heart failure (HCC)   Sepsis (HCC)   Aspiration pneumonia of both lower lobes due to gastric secretions ([place])    Adaline Sill, NP-C 05/08/2020 1:07 PM

## 2020-05-08 NOTE — Progress Notes (Addendum)
Daily Progress Note   Patient Name: Jason Fitzgerald       Date: 05/08/2020 DOB: 18-Dec-1950  Age: 70 y.o. MRN#: [id number] Attending Physician: Sidney Ace, MD Primary Care Physician: Steele Sizer, MD Admit Date: 04/29/2020  Reason for Consultation/Follow-up: Establishing goals of care  Subjective: Patient is resting in bed. He is perseverating on not being able to rest with people coming in and out as well as "people putting medicine in my beans". He states he wants to leave the hospital. Discussed a plan of agressive management vs comfort. He states he will need to speak with his sister about it. He states he was in rehab years ago for cocaine. He states he was off it for years and decided to resume use, "but people keep acting like I'm going to start using it again".  He then requests to take a nap. Would recommend outpatient palliative to follow for [place].   Length of Stay: 8  Current Medications: Scheduled Meds:  . atorvastatin  40 mg Oral Daily  . calcium carbonate  400 mg of elemental calcium Oral TID WC  . collagenase   Topical Daily  . enoxaparin (LOVENOX) injection  30 mg Subcutaneous Q24H  . feeding supplement (NEPRO CARB STEADY)  237 mL Oral BID BM  . fludrocortisone  0.1 mg Oral Daily  . folic acid  1 mg Oral Daily  . midodrine  10 mg Oral TID WC  . multivitamin with minerals  1 tablet Oral Daily  . polyethylene glycol  17 g Oral Daily  . predniSONE  10 mg Oral Q breakfast  . thiamine  100 mg Oral Daily   Or  . thiamine  100 mg Intravenous Daily    Continuous Infusions:   PRN Meds: acetaminophen, ondansetron **OR** ondansetron (ZOFRAN) IV  Physical Exam Pulmonary:     Effort: Pulmonary effort is normal.  Neurological:     Mental Status: He is alert.              Vital Signs: BP 110/79 (BP Location: Right Arm)   Pulse 86   Temp 98.1 F (36.7 C) (Oral)   Resp 18   Ht 6' (1.829 m)   Wt 78.5 kg   SpO2 95%   BMI 23.47 kg/m  SpO2: SpO2: 95 % O2 Device: O2 Device:  Room Air O2 Flow Rate:    Intake/output summary:   Intake/Output Summary (Last 24 hours) at 05/08/2020 1442 Last data filed at 05/08/2020 1214 Gross per 24 hour  Intake 240 ml  Output 1600 ml  Net -1360 ml   LBM: Last BM Date: 05/08/20 Baseline Weight: Weight: 74.8 kg Most recent weight: Weight: 78.5 kg           Patient Active Problem List   Diagnosis Date Noted  . Acute on chronic systolic (congestive) heart failure ([place]) 04/30/2020  . Sepsis ([place]) 04/30/2020  . Aspiration pneumonia of both lower lobes due to gastric secretions ([place]) 04/30/2020  . Shortness of breath 04/29/2020  . Prolonged QT interval 04/29/2020  . DNR (do not resuscitate) 04/22/2020  . Atrial fibrillation with rapid ventricular response ([place]) 04/18/2020  . Non compliance w medication regimen   . Atrial fibrillation ([place]) 12/09/2019  . Atrial fibrillation with RVR ([place]) 12/08/2019  . Slurred speech 12/08/2019  . Chest pain 12/08/2019  . Elevated troponin 12/08/2019  . Acute CHF (congestive heart failure) ([place]) 08/05/2019  . Diarrhea   . Dizziness 12/17/2018  . Hypotension 12/17/2018  . Hypophosphatemia 12/17/2018  . Hypomagnesemia 12/17/2018  . Positive D-dimer 12/17/2018  . Abnormal LFTs (liver function tests) 12/17/2018  . Anemia 12/17/2018  . Fibrosis, pulmonary, interstitial, diffuse ([place])   . GERD (gastroesophageal reflux disease)   . COPD (chronic obstructive pulmonary disease) ([place])   . CHF (congestive heart failure) ([place])   . Encounter for screening colonoscopy   . Polyp of transverse colon   . Internal hemorrhoids   . Hypokalemia 02/03/2018  . History of alcoholism ([place]) 12/08/2017  . Rheumatoid arthritis involving multiple sites with positive rheumatoid factor ([place])  10/13/2017  . Chronic systolic CHF (congestive heart failure) ([place]) 03/12/2017  . AF (paroxysmal atrial fibrillation) ([place]) 03/12/2017  . Arthritis of left knee 08/01/2016  . 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 Chronic cough 05/29/2015  . CAFL (chronic airflow limitation) ([place]) 10/24/2014  . Arteriosclerosis of coronary artery 10/24/2014  . History of fundoplication 32/20/2542  . HTN (hypertension) 10/24/2014  . LAD (lymphadenopathy), mediastinal 07/13/2013  . Interstitial lung disease ([place]) 06/19/2013  . Postinflammatory pulmonary fibrosis ([place]) 06/15/2013    Palliative Care Assessment & Plan     Recommendations/Plan: Recommend outpatient palliative.     Code Status:    Code Status Orders  (From admission, onward)         Start     Ordered   05/01/20 1344  Full code  Continuous        05/01/20 1343        Code Status History    Date Active Date Inactive Code Status Order ID Comments User Context   04/29/2020 1256 05/01/2020 1343 DNR [id number]  Criss Alvine, DO ED   04/22/2020 0956 04/22/2020 1825 DNR [id number]  Debbe Odea, MD Inpatient   04/18/2020 1008 04/22/2020 0956 Full Code [id number]  Collier Bullock, MD ED   12/08/2019 1430 12/14/2019 0120 Full Code [id number]  Ivor Costa, MD ED   08/05/2019 0423 08/07/2019 2106 Full Code [id number]  Mansy, Arvella Merles, MD ED   12/17/2018 1453 12/20/2018 1529 Full Code [id number]  Para Skeans, MD ED   02/03/2018 1520 02/05/2018 1811 Full Code [id number]  Saundra Shelling, MD ED   03/04/2017 1231 03/06/2017 1707 Full Code [id number]  Demetrios Loll, MD ED   10/26/2014 1128 10/26/2014 1500 Full Code [id number]  Hubbard Robinson, MD Outpatient   Advance  Care Planning Activity      Prognosis:  < 6 months    Care plan was discussed with TOC through epic chat  Thank you for allowing the Palliative Medicine Team to assist in the care of this patient.   15 min  Prolonged Time Billed  no}       Greater than 50%  of this time was  spent counseling and coordinating care related to the above assessment and plan.  Asencion Gowda, NP  Please contact Palliative Medicine Team phone at [phone] for questions and concerns.

## 2020-05-08 NOTE — Progress Notes (Signed)
PROGRESS NOTE    Jason Fitzgerald  RJJ:[id number] DOB: 1950-04-07 DOA: 04/29/2020 PCP: Steele Sizer, MD   Brief Narrative:  Jason Ores Mooreis a 70 y.o.malewith medical history significant forhypertension, interstitial lung disease, heart failure reduced ejection fraction, hyperlipidemia, history of atrial fibrillation, presents emergency department by EMS for shortness of breath.  Patient condition has been progressively getting worse over the last week, he has a severe short of breath with minimal exertion, he has been sleeping in a recliner, frequent paroxysmal nocturnal dyspnea.  He also drinks alcohol and he used cocaine. Upon arriving the hospital, he was hypotensive.    Time status appears to optimize.  Lasix been on hold due to rising kidney function.  Case has been discussed with nephrology.  Patient did have contrast exposure on 3/28 and had a subsequent rise in creatinine since that time.  AKI felt to be multifactorial due to diuresis in the setting of contrast exposure.   Assessment & Plan:   Principal Problem:   Shortness of breath Active Problems:   Arteriosclerosis of coronary artery   HTN (hypertension)   Interstitial lung disease (HCC)   Chronic cough   Degenerative arthritis of lumbar spine   Chronic systolic CHF (congestive heart failure) (HCC)   AF (paroxysmal atrial fibrillation) (HCC)   Rheumatoid arthritis involving multiple sites with positive rheumatoid factor (HCC)   Fibrosis, pulmonary, interstitial, diffuse (HCC)   GERD (gastroesophageal reflux disease)   COPD (chronic obstructive pulmonary disease) (HCC)   CHF (congestive heart failure) (HCC)   Anemia   Atrial fibrillation (HCC)   Non compliance w medication regimen   DNR (do not resuscitate)   Prolonged QT interval   Acute on chronic systolic (congestive) heart failure (HCC)   Sepsis ([place])   Aspiration pneumonia of both lower lobes due to gastric secretions (HCC)  Goals of care - Palliative  care was involved during last hospitalization and pt reportedly elected to be DNR.  However, family asked for pt to be switched back to Full Code during this hospitalization.  - palliative care consulted during this hospitalization --Follow-up and recommendations appreciated --Given chronic advanced illness patient likely appropriate for DNR status --Also would recommend outpatient palliative care follow-up   AKI, not POA --Cr 0.99 on presentation, trending up to 3.05 --likely due to diuresis while intravascularly depleted, hypotension --nephrology consulted --Likely multifactorial secondary to diuresis, patient also received IV contrast on 3/28 Plan: --Hold diuresis and Farxiga --cont midodrine and avoid hypotension  #1.  Acute on chronic systolic congestive heart failure. --Patient has evidence of volume overload, significant paroxysmal nocturnal dyspnea, leg edema.  He has intermittent hypotension due to severe LV dysfunction.  Ejection fraction less than 20%. --Patient overall condition appears to be end-stage congestive heart failure.  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 --transition from lasix gtt to oral lasix 20 mg BID, then held due to AKI.   Plan: --Hold diuresis due to worsening AKI and pt currently not hypoxic --Hold Farxiga 2/2 AKI --Hold BB due to hypotension, per pulm --HoldEntrestod/t hypotension.  --encourage oral hydration   Paroxysmal atrial fibrillation. --currently rate controlled --Due to pulmonary fibrosis,cannot be placed on amiodarone. 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 Severe hypoalbuminemia. Intermittent hypotension. Coagulopathy secondary to liver failure. Patient also has liver function changes with elevated bilirubin.  This is due to right-sided congestive heart failure. Patient  condition is very  serious, very high risk of mortality.  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 --consulted pulm, started on Florinef and predniosne --cont Florinef 0.1 mg daily --cont prednisone 10 mg daily, per pulm  #2.  Severe sepsis. Aspiration pneumonia. Patient met sepsis criteria at time admission, he had tachycardia, tachypnea, hypotension, leukocytosis.  He also has significant lactic acidosis. Due to alcohol drinking, patient has high risk of aspiration pneumonia.  Patient also has Pseudomonas risk due to interstitial lung disease.   --procal elevated --finished 5 days of cefepime -No further antibiotics  4.  Alcohol abuse. Cocaine abuse. --no alcohol withdrawal --Continue thiamine and folic acid. --Counsel patient on cessation  #6.  Severe protein calorie malnutrition. Poor oral intake and possible swallow issues Patient had significant muscle atrophy, looks malnourished.   --refused all Ensure supplement offered.  Said that he had to be on it for years and got sick of it. Plan: --SLP, rec Dys 1  #7.  Hypokalemia. --monitor and replete PRN  Constipation, resolved --cont Miralax 17g daily  Unstageable ulcers over left shoulder, sacral areas and bilateral buttocks, POA --wound care consulted, recommended surgical consult --Surgery consult on 3/31, does not recommend debridement.  Whole-body pain --avoid narcotics --can't give NSAIDs 2/2 AKI --Tylenol 1g TID PRN --can apply lidocaine patch if pt can localize pain   DVT prophylaxis: SQ Lovenox Code Status: Full Family Communication: None today Disposition Plan: Status is: Inpatient  Remains inpatient appropriate because:Inpatient level of care appropriate due to severity of illness   Dispo: The patient is from: Home              Anticipated d/c is to: SNF              Patient currently is not medically stable to  d/c.   Difficult to place patient No   Possible discharge in 24 hours if creatinine improving    Level of care: Progressive Cardiac  Consultants:  Cardiology Nephrology Pulmonary  Procedures:   None  Antimicrobials:  None   Subjective: Patient seen and examined. Awake and lethargic but hemodynamically stable and answers all questions appropriately.  Frustrated about continued hospital stay.  Objective: Vitals:   05/07/20 1537 05/07/20 1905 05/08/20 0408 05/08/20 0712  BP: 107/79 (!) 112/99 104/84 106/84  Pulse: 88 (!) 108 89 79  Resp: (!) _0 Temp: 97.6 F (36.4 C) 97.9 F (36.6 C) 97.8 F (36.6 C) 98 F (36.7 C)  TempSrc: Oral  Oral   SpO2: 100% 97% 100% 96%  Weight:   78.5 kg   Height:        Intake/Output Summary (Last 24 hours) at 05/08/2020 1158 Last data filed at 05/07/2020 1830 Gross per 24 hour  Intake 240 ml  Output 900 ml  Net -660 ml   Filed Weights   05/05/20 0500 05/06/20 0412 05/08/20 0408  Weight: 81.7 kg 78.6 kg 78.5 kg    Examination:  General exam: No acute distress.  Appears frail and chronically ill Respiratory system: Poor respiratory effort.  Bilateral crackles.  Normal work of breathing.  Room air Cardiovascular system: S1 & S2 heard, RRR. No JVD, murmurs, rubs, gallops or clicks. No pedal edema. Gastrointestinal system: Thin/scaphoid, nontender, nondistended, normal bowel sounds Central nervous system: Alert, oriented x2, no focal deficits Extremities: Decreased power bilateral upper and lower extremities Skin: Sacral ulcer, left scapular ulcer Psychiatry: Judgement and insight appear impaired. Mood & affect flattened.  Data Reviewed: I have personally reviewed following labs and imaging studies  CBC: Recent Labs  Lab 05/04/20 0429 05/05/20 2037 05/06/20 0728 05/07/20 0851 05/08/20 0853  WBC 10.5 9.1 10.5 7.1 10.3  HGB 11.3* 12.0* 11.4* 11.2* 12.2*  HCT 33.0* 35.2* 32.9* 33.1* 36.9*  MCV 78.8* 78.7*  77.8* 78.6* 81.1  PLT 172 196 221 184 007*   Basic Metabolic Panel: Recent Labs  Lab 05/04/20 0429 05/05/20 2037 05/06/20 0843 05/07/20 0851 05/08/20 0853  NA 133* 134* 136 134* 135  K 3.7 4.5 3.7 3.9 4.3  CL 96* 97* 99 97* 100  CO2 24 21* 25 23 18*  GLUCOSE 113* 131* 115* 126* 106*  BUN 50* 65* 72* 82* 89*  CREATININE 1.71* 2.41* 2.73* 3.04* 3.05*  CALCIUM 8.4* 8.5* 8.7* 8.7* 8.6*  MG 2.4 2.6* 2.7* 2.9* 3.2*   GFR: Estimated Creatinine Clearance: 25.4 mL/min (A) (by C-G formula based on SCr of 3.05 mg/dL (H)). Liver Function Tests: No results for input(s): AST, ALT, ALKPHOS, BILITOT, PROT, ALBUMIN in the last 168 hours. No results for input(s): LIPASE, AMYLASE in the last 168 hours. No results for input(s): AMMONIA in the last 168 hours. Coagulation Profile: No results for input(s): INR, PROTIME in the last 168 hours. Cardiac Enzymes: No results for input(s): CKTOTAL, CKMB, CKMBINDEX, TROPONINI in the last 168 hours. BNP (last 3 results) No results for input(s): PROBNP in the last 8760 hours. HbA1C: No results for input(s): HGBA1C in the last 72 hours. CBG: No results for input(s): GLUCAP in the last 168 hours. Lipid Profile: No results for input(s): CHOL, HDL, LDLCALC, TRIG, CHOLHDL, LDLDIRECT in the last 72 hours. Thyroid Function Tests: No results for input(s): TSH, T4TOTAL, FREET4, T3FREE, THYROIDAB in the last 72 hours. Anemia Panel: No results for input(s): VITAMINB12, FOLATE, FERRITIN, TIBC, IRON, RETICCTPCT in the last 72 hours. Sepsis Labs: No results for input(s): PROCALCITON, LATICACIDVEN in the last 168 hours.  Recent Results (from the past 240 hour(s))  Culture, blood (routine x 2)     Status: None   Collection Time: 04/29/20  9:34 AM   Specimen: BLOOD  Result Value Ref Range Status   Specimen Description BLOOD RIGHT ARM  Final   Special Requests   Final    BOTTLES DRAWN AEROBIC AND ANAEROBIC Blood Culture adequate volume   Culture   Final    NO  GROWTH 5 DAYS Performed at [facility], [address] [place], [place] [address]    Report Status 05/04/2020 FINAL  Final  Culture, blood (routine x 2)     Status: None   Collection Time: 04/29/20  9:34 AM   Specimen: BLOOD  Result Value Ref Range Status   Specimen Description BLOOD BLOOD LEFT ARM  Final   Special Requests   Final    BOTTLES DRAWN AEROBIC AND ANAEROBIC Blood Culture adequate volume   Culture   Final    NO GROWTH 5 DAYS Performed at [facility], [address], [place], [place] [address]    Report Status 05/04/2020 FINAL  Final  SARS CORONAVIRUS 2 (TAT 6-24 HRS) Nasopharyngeal Nasopharyngeal Swab     Status: None   Collection Time: 04/29/20  9:40 AM   Specimen: Nasopharyngeal Swab  Result Value Ref Range Status   SARS Coronavirus 2 NEGATIVE NEGATIVE Final    Comment: (NOTE) SARS-CoV-2 target nucleic acids are NOT DETECTED.  The SARS-CoV-2 RNA is generally detectable in upper and lower respiratory specimens during the acute phase of infection. Negative  results do not preclude SARS-CoV-2 infection, do not rule out co-infections with other pathogens, and should not be used as the sole basis for treatment or other patient management decisions. Negative results must be combined with clinical observations, patient history, and epidemiological information. The expected result is Negative.  Fact Sheet for Patients: SugarRoll.be  Fact Sheet for Healthcare Providers: https://www.woods-mathews.com/  This test is not yet approved or cleared by the [place] FDA and  has been authorized for detection and/or diagnosis of SARS-CoV-2 by FDA under an Emergency Use Authorization (EUA). This EUA will remain  in effect (meaning this test can be used) for the duration of the COVID-19 declaration under Se ction 564(b)(1) of the Act, 21 U.S.C. section 360bbb-3(b)(1), unless the authorization is terminated  or revoked sooner.  Performed at [facility], [place] [address], [place], [place] [address]          Radiology Studies: No results found.      Scheduled Meds: . atorvastatin  40 mg Oral Daily  . calcium carbonate  400 mg of elemental calcium Oral TID WC  . collagenase   Topical Daily  . enoxaparin (LOVENOX) injection  30 mg Subcutaneous Q24H  . feeding supplement (NEPRO CARB STEADY)  237 mL Oral BID BM  . fludrocortisone  0.1 mg Oral Daily  . folic acid  1 mg Oral Daily  . midodrine  10 mg Oral TID WC  . multivitamin with minerals  1 tablet Oral Daily  . polyethylene glycol  17 g Oral Daily  . predniSONE  10 mg Oral Q breakfast  . thiamine  100 mg Oral Daily   Or  . thiamine  100 mg Intravenous Daily   Continuous Infusions:   LOS: 8 days    Time spent: 15 minutes    Sidney Ace, MD [facility] Pager [phone]  If 7PM-7AM, please contact night-coverage 05/08/2020, 11:58 AM

## 2020-05-08 NOTE — Progress Notes (Signed)
PT Cancellation Note  Patient Details Name: Jason Fitzgerald MRN: [id number] DOB: February 06, 1950   Cancelled Treatment:     PT attempt, 2nd attempt this afternoon. Pt unwilling. " I already got up today. I need to sleep." He requested therapist return tomorrow. Acute PT will continue to follow per POC.    Willette Pa 05/08/2020, 3:51 PM

## 2020-05-08 NOTE — Plan of Care (Signed)
Patient has been decling care periodically during stay per report.  So far, has refused ALL meds except Lovenox.  Say, "I don't need no meds. My body is fine and I just want out of here." Even refused to let staff assess or place meds on his sores.  States, " Nobody's putting nothing on my sores  - Ya'll gonna leave me alone."  I attempted to educate and get the the reason for his denial, but patient didn't want to discuss - Only wanted to go.  Dr. Informed and aware.  Plan is to hopefully DC to SNF on 4/7 if labs improve.

## 2020-05-08 NOTE — Progress Notes (Signed)
Pulmonary Medicine          Date: 05/08/2020,   MRN# [id number] Jason Fitzgerald [birth year]     AdmissionWeight: 74.8 kg                 CurrentWeight: 78.5 kg   Referring physician: Dr Billie Ruddy   CHIEF COMPLAINT:   Acute on chronic hypoxemic respiratory failure in context of pulmonary fibrosis.    HISTORY OF PRESENT ILLNESS   70 yo M w PMH of CHF , COPD, substance abuse , Pulmonary fibrosis, emphysema, chronic hypoxemic respiratory failure, chronic cough who has had recurrent hospitalization for respiratory failure. He has previously been referred to hospice due to comorbid health status however has declined comfort care measures and wishes to continue therapy. He had Transthoracic echo 04/18/20 and it showed severe CHF with EF <16%, RV systolic fucntion is also reduced, there is severe MR. He had CT chest done 12/2019 with findings of basal predominant bilateral pulmonary fibrosis with associated traction bronchiectasis and concomitant upper lobe predominant centriobular emphysema with mild to moderate pleural effusions. Pictorial documentation is included below. Pulmonary consultation placed per patients wishes to have more respiratory workup to help his chronic lung disease.    05/04/20-Patient reports improvement clinically. He feels less dyspnea and wants to try physical therapy more. He has less LE edema.  His urine is dark and renal function worse, discussed with attending physician will hold diuresis. We reviewed incentive spirometry techinique, he is very deconditioned takes volumes up to 350cc only.  He is on room air and BP is low and HR is 74 despite stopping beta blocker and placing on midodrine 10 TID. Cardiology is on case will review regarding re-initiation of beta blockade in lieu of CHF but currently I would favor holding until further guidance due to risk of hypotension.  He is on florinef and prednisone 20 mg due to adrenal insufficiency and interstitial lung disease,  this was started yesterday as salvage therapy for this patient.  We discussed smoking cessation today, he smoked Thursday and states the has not smoked in the hospital which is good.   05/05/20- Patient is sitting up in bed in no distress. He refused blood work today and I discussed this with him.  He worked with [facility] OT service and is improved. Reviewed care plan with Dr Billie Ruddy today.   05/06/2020-patient evaluated at bedside, resting in bed in no acute distress, reports breathing is better..  No acute events overnight.  Repeat blood work noted with significant worsening of his renal impairment.  CBC with differential improved with normalization of WBC count.  SPO2 100% on room air with normal vital signs today.  Ongoing PT and OT. He remains on midodrine has not had diuretics in 24h, appears dry on examination and has increased creatinine today.  I ordered renal evalaution and discussed this with Dr Billie Ruddy.   05/07/20- patient has improved respiratory status but overall his chronic lung disease is severe with combined pulmonary fibrosis and emphysema (CPFE) as well as advanced end stage systolic CHF and renal failure. He is being followed by nephrology and palliative.  His prognosis long term is poor. He is on room air now and speaks in no distress.    05/08/20- patient states he is clinically improved, he is sitting up in chair.  He has refused therapy today. He is wanting to go home. Still on room air. He is weak and deconditined.  He is working with PT/OT.  Renal funcction reduced with elevated anion gap. Have ordered lactic acid for am if he is still here.   PAST MEDICAL HISTORY   Past Medical History:  Diagnosis Date  . CHF (congestive heart failure) ([place])   . Chronic cough   . COPD (chronic obstructive pulmonary disease) ([place])   . Elevated liver function tests   . Emphysema of lung ([place])   . Fibrosis, pulmonary, interstitial, diffuse ([place])   . GERD (gastroesophageal reflux disease)   . History of  cocaine abuse ([place])   . Mediastinal lymphadenopathy   . Pulmonary fibrosis ([place])   . Right inguinal hernia      SURGICAL HISTORY   Past Surgical History:  Procedure Laterality Date  . COLONOSCOPY WITH PROPOFOL N/A 08/19/2018   Procedure: COLONOSCOPY WITH PROPOFOL;  Surgeon: Virgel Manifold, MD;  Location: [facility] ENDOSCOPY;  Service: Endoscopy;  Laterality: N/A;  . HERNIA REPAIR Right 1973   open  . INGUINAL HERNIA REPAIR Right 11/08/2014   Procedure: LAPAROSCOPIC RIGHT INGUINAL HERNIA REPAIR;  Surgeon: Hubbard Robinson, MD;  Location: [facility] ORS;  Service: General;  Laterality: Right;  . knee arthroscopy Right 1972  . RIGHT/LEFT HEART CATH AND CORONARY ANGIOGRAPHY Right 03/05/2017   Procedure: RIGHT/LEFT HEART CATH AND CORONARY ANGIOGRAPHY;  Surgeon: Dionisio David, MD;  Location: [facility];  Service: Cardiovascular;  Laterality: Right;     FAMILY HISTORY   Family History  Problem Relation Age of Onset  . Diabetes Mother   . Cancer Father   . AAA (abdominal aortic aneurysm) Brother      SOCIAL HISTORY   Social History   Tobacco Use  . Smoking status: Never Smoker  . Smokeless tobacco: Never Used  Vaping Use  . Vaping Use: Never used  Substance Use Topics  . Alcohol use: Not Currently    Alcohol/week: 0.0 standard drinks    Comment: 2 quarts a week, he quit again 12/2018  . Drug use: Not Currently    Types: Cocaine    Comment: as an young adult      MEDICATIONS    Home Medication:    Current Medication:  Current Facility-Administered Medications:  .  acetaminophen (TYLENOL) tablet 1,000 mg, 1,000 mg, Oral, TID PRN, Enzo Bi, MD, 1,000 mg at 05/06/20 1233 .  atorvastatin (LIPITOR) tablet 40 mg, 40 mg, Oral, Daily, Cox, Amy N, DO, 40 mg at 05/07/20 0930 .  calcium carbonate (TUMS - dosed in mg elemental calcium) chewable tablet 400 mg of elemental calcium, 400 mg of elemental calcium, Oral, TID WC, Sharen Hones, MD, 400 mg of elemental calcium at  05/07/20 0804 .  collagenase (SANTYL) ointment, , Topical, Daily, Enzo Bi, MD, 1 application at 17/51/02 1209 .  enoxaparin (LOVENOX) injection 30 mg, 30 mg, Subcutaneous, Q24H, Oswald Hillock, RPH, 30 mg at 05/08/20 1208 .  feeding supplement (NEPRO CARB STEADY) liquid 237 mL, 237 mL, Oral, BID BM, Singh, Harmeet, MD .  fludrocortisone (FLORINEF) tablet 0.1 mg, 0.1 mg, Oral, Daily, Lanney Gins, Lashandra Arauz, MD, 0.1 mg at 05/07/20 0931 .  folic acid (FOLVITE) tablet 1 mg, 1 mg, Oral, Daily, Cox, Amy N, DO, 1 mg at 05/07/20 0931 .  midodrine (PROAMATINE) tablet 10 mg, 10 mg, Oral, TID WC, Cox, Amy N, DO, 10 mg at 05/07/20 1222 .  multivitamin with minerals tablet 1 tablet, 1 tablet, Oral, Daily, Cox, Amy N, DO, 1 tablet at 05/06/20 0937 .  ondansetron (ZOFRAN) tablet 4 mg, 4 mg, Oral, Q6H PRN **OR**  ondansetron (ZOFRAN) injection 4 mg, 4 mg, Intravenous, Q6H PRN, Cox, Amy N, DO, 4 mg at 05/04/20 0956 .  polyethylene glycol (MIRALAX / GLYCOLAX) packet 17 g, 17 g, Oral, Daily, Enzo Bi, MD, 17 g at 05/06/20 0936 .  predniSONE (DELTASONE) tablet 10 mg, 10 mg, Oral, Q breakfast, Willie Plain, MD, 10 mg at 05/07/20 0804 .  thiamine tablet 100 mg, 100 mg, Oral, Daily, 100 mg at 05/07/20 0930 **OR** thiamine (B-1) injection 100 mg, 100 mg, Intravenous, Daily, Cox, Amy N, DO, 100 mg at 05/02/20 1275    ALLERGIES   Patient has no known allergies.     REVIEW OF SYSTEMS    Review of Systems:  Gen:  Denies  fever, sweats, chills weigh loss  HEENT: Denies blurred vision, double vision, ear pain, eye pain, hearing loss, nose bleeds, sore throat Cardiac:  No dizziness, chest pain or heaviness, chest tightness,edema Resp:   Denies cough or sputum porduction, shortness of breath,wheezing, hemoptysis,  Gi: Denies swallowing difficulty, stomach pain, nausea or vomiting, diarrhea, constipation, bowel incontinence Gu:  Denies bladder incontinence, burning urine Ext:   Denies Joint pain, stiffness or  swelling Skin: Denies  skin rash, easy bruising or bleeding or hives Endoc:  Denies polyuria, polydipsia , polyphagia or weight change Psych:   Denies depression, insomnia or hallucinations   Other:  All other systems negative   VS: BP 110/79 (BP Location: Right Arm)   Pulse 86   Temp 98.1 F (36.7 C) (Oral)   Resp 18   Ht 6' (1.829 m)   Wt 78.5 kg   SpO2 95%   BMI 23.47 kg/m      PHYSICAL EXAM    GENERAL:NAD, no fevers, chills, no weakness no fatigue HEAD: Normocephalic, atraumatic.  EYES: Pupils equal, round, reactive to light. Extraocular muscles intact. No scleral icterus.  MOUTH: Moist mucosal membrane. Dentition intact. No abscess noted.  EAR, NOSE, THROAT: Clear without exudates. No external lesions.  NECK: Supple. No thyromegaly. No nodules. No JVD.  PULMONARY: crepitations bilaterally suggestive of pulmoary fibrosis worse at bases. CARDIOVASCULAR: S1 and S2. Regular rate and rhythm. No murmurs, rubs, or gallops. No edema. Pedal pulses 2+ bilaterally.  GASTROINTESTINAL: Soft, nontender, nondistended. No masses. Positive bowel sounds. No hepatosplenomegaly.  MUSCULOSKELETAL: No swelling, clubbing, or edema. Range of motion full in all extremities.  NEUROLOGIC: Cranial nerves II through XII are intact. No gross focal neurological deficits. Sensation intact. Reflexes intact.  SKIN: No ulceration, lesions, rashes, or cyanosis. Skin warm and dry. Turgor intact.  PSYCHIATRIC: Mood, affect within normal limits. The patient is awake, alert and oriented x 3. Insight, judgment intact.       IMAGING    CT Abdomen Pelvis W Contrast  Result Date: 04/29/2020 CLINICAL DATA:  Acute nonlocalized abdominal pain EXAM: CT ABDOMEN AND PELVIS WITH CONTRAST TECHNIQUE: Multidetector CT imaging of the abdomen and pelvis was performed using the standard protocol following bolus administration of intravenous contrast. CONTRAST:  122mL OMNIPAQUE IOHEXOL 300 MG/ML  SOLN COMPARISON:   07/20/2013 FINDINGS: Lower chest: Small bilateral pleural effusion. Fibrosis with honeycombing is seen diffusely at the lung bases. Heart size is within normal limits. Gynecomastia. Hepatobiliary: Heterogeneous density of the liver with nutmeg appearance. Subcapsular right hepatic cyst. No solid masslike finding.Calcification at the gallbladder fossa which appears extraluminal and is chronic. No evidence of biliary inflammation or obstruction. Pancreas: Unremarkable. Spleen: Unremarkable. Adrenals/Urinary Tract: Negative adrenals. No hydronephrosis or stone. Small renal cystic densities. Unremarkable bladder. Stomach/Bowel:  No obstruction. No  visible inflammation of bowel. Vascular/Lymphatic: No acute vascular abnormality. Scattered atheromatous calcification of the aorta. No mass or adenopathy. Reproductive:Partially covered right hydrocele Other: No ascites or pneumoperitoneum.  Hernia repair using mesh. Musculoskeletal: No acute abnormalities.  Lumbar spine degeneration. IMPRESSION: 1. No acute intra-abdominal finding. 2. Heterogeneous liver perfusion which could be from fibrosis or right heart failure. 3. Small pleural effusions. 4.  Aortic Atherosclerosis (ICD10-I70.0). Electronically Signed   By: Monte Fantasia M.D.   On: 04/29/2020 11:31   US RENAL  Result Date: 04/20/2020 CLINICAL DATA:  AK I EXAM: RENAL / URINARY TRACT ULTRASOUND COMPLETE COMPARISON:  None. FINDINGS: Right Kidney: Renal measurements: 11.3 x 5.3 x 6.0 cm = volume: 187 mL. Echogenicity within normal limits. No mass or hydronephrosis visualized. Left Kidney: Renal measurements: 11.7 x 5.5 x 5.8 cm = volume: 194 mL. Echogenicity within normal limits. No mass or hydronephrosis visualized. Bladder: Decompressed with Foley catheter in place. Other: None. IMPRESSION: Unremarkable sonographic appearance of the bilateral kidneys. Electronically Signed   By: Audie Pinto M.D.   On: 04/20/2020 11:49   DG Chest Port 1 View  Result Date:  04/29/2020 CLINICAL DATA:  Shortness of breath and pain. EXAM: PORTABLE CHEST 1 VIEW COMPARISON:  04/18/2020 FINDINGS: Stable cardiomediastinal contours. Extensive fibrotic lung disease is identified within both lungs. This is most advanced within the mid and lower lung zones. No definite superimposed pulmonary opacity. There is a suggestion of subcutaneous gas within the soft tissues in the left supraclavicular region. IMPRESSION: 1. Extensive fibrotic lung disease. 2. No superimposed acute cardiopulmonary abnormality. Electronically Signed   By: Kerby Moors M.D.   On: 04/29/2020 10:43   DG Chest Port 1 View  Result Date: 04/18/2020 CLINICAL DATA:  Chest pain EXAM: PORTABLE CHEST 1 VIEW COMPARISON:  December 08, 2019 chest radiograph; chest CT December 09, 2019 FINDINGS: There is extensive fibrosis throughout the lungs, most severely in the mid and lower lung regions. There are small pleural effusions bilaterally. The heart size is within normal limits. Pulmonary vascularity is within normal limits. Lymph node prominence noted on prior CT is not well delineated radiography. No bone lesions. IMPRESSION: Extensive underlying fibrosis with small pleural effusions bilaterally. No appreciable new opacity compared to prior studies. Stable cardiac silhouette. Electronically Signed   By: Lowella Grip III M.D.   On: 04/18/2020 09:04   ECHOCARDIOGRAM COMPLETE  Result Date: 04/19/2020    ECHOCARDIOGRAM REPORT   Patient Name:   Jason Fitzgerald Date of Exam: 04/18/2020 Medical Rec #:  [id number]       Height:       69.0 in Accession #:    [id number]      Weight:       171.0 lb Date of Birth:  April 17, 1950      BSA:          1.933 m Patient Age:    4 years        BP:           100/68 mmHg Patient Gender: M               HR:           94 bpm. Exam Location:  [facility] Procedure: 2D Echo, Cardiac Doppler and Color Doppler Indications:     CHF-acute systolic I78.67  History:         Patient has prior history of  Echocardiogram examinations, most  recent 12/09/2019. CHF; COPD.  Sonographer:     Sherrie Sport RDCS (AE) Referring Phys:  [id number] [place] Diagnosing Phys: Neoma Laming MD  Sonographer Comments: Suboptimal parasternal window. IMPRESSIONS  1. Left ventricular ejection fraction, by estimation, is <20%. The left ventricle has severely decreased function. The left ventricle demonstrates global hypokinesis. The left ventricular internal cavity size was severely dilated. There is mild concentric left ventricular hypertrophy. Left ventricular diastolic parameters are consistent with Grade II diastolic dysfunction (pseudonormalization).  2. Right ventricular systolic function is mildly reduced. The right ventricular size is moderately enlarged.  3. Left atrial size was severely dilated.  4. Right atrial size was severely dilated.  5. 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 No evidence of mitral stenosis.  6. The aortic valve is grossly normal. Aortic valve regurgitation is not visualized. Mild aortic valve sclerosis is present, with no evidence of aortic valve stenosis. FINDINGS  Left Ventricle: Left ventricular ejection fraction, by estimation, is <20%. The left ventricle has severely decreased function. The left ventricle demonstrates global hypokinesis. The left ventricular internal cavity size was severely dilated. There is mild concentric left ventricular hypertrophy. Left ventricular diastolic parameters are consistent with Grade II diastolic dysfunction (pseudonormalization). Right Ventricle: The right ventricular size is moderately enlarged. No increase in right ventricular wall thickness. Right ventricular systolic function is mildly reduced. Left Atrium: Left atrial size was severely dilated. Right Atrium: Right atrial size was severely dilated. Pericardium: There is no evidence of pericardial effusion. Mitral Valve: The mitral valve is myxomatous. Severe mitral  valve regurgitation. No evidence of mitral valve stenosis. Tricuspid Valve: The tricuspid valve is grossly normal. Tricuspid valve regurgitation is mild. Aortic Valve: The aortic valve is grossly normal. Aortic valve regurgitation is not visualized. Mild aortic valve sclerosis is present, with no evidence of aortic valve stenosis. Aortic valve mean gradient measures 1.0 mmHg. Aortic valve peak gradient measures 1.7 mmHg. Aortic valve area, by VTI measures 1.85 cm. Pulmonic Valve: The pulmonic valve was normal in structure. Pulmonic valve regurgitation is trivial. Aorta: The aortic root, ascending aorta and aortic arch are all structurally normal, with no evidence of dilitation or obstruction. IAS/Shunts: The interatrial septum was not assessed.  LEFT VENTRICLE PLAX 2D LVIDd:         5.57 cm LVIDs:         5.01 cm LV PW:         0.90 cm LV IVS:        0.78 cm LVOT diam:     2.10 cm LV SV:         13 LV SV Index:   7 LVOT Area:     3.46 cm  LV Volumes (MOD) LV vol d, MOD A2C: 115.0 ml LV vol d, MOD A4C: 194.0 ml LV vol s, MOD A2C: 91.0 ml LV vol s, MOD A4C: 154.0 ml LV SV MOD A2C:     24.0 ml LV SV MOD A4C:     194.0 ml LV SV MOD BP:      27.8 ml RIGHT VENTRICLE RV S prime:     9.79 cm/s TAPSE (M-mode): 3.7 cm LEFT ATRIUM             Index       RIGHT ATRIUM           Index LA diam:        4.30 cm 2.22 cm/m  RA Area:     16.10 cm LA Vol (A2C):  84.0 ml 43.46 ml/m RA Volume:   43.50 ml  22.50 ml/m LA Vol (A4C):   56.0 ml 28.97 ml/m LA Biplane Vol: 69.5 ml 35.96 ml/m  AORTIC VALVE                   PULMONIC VALVE AV Area (Vmax):    1.41 cm    PV Vmax:        0.18 m/s AV Area (Vmean):   1.72 cm    PV Peak grad:   0.1 mmHg AV Area (VTI):     1.85 cm    RVOT Peak grad: 1 mmHg AV Vmax:           66.00 cm/s AV Vmean:          42.200 cm/s AV VTI:            0.072 m AV Peak Grad:      1.7 mmHg AV Mean Grad:      1.0 mmHg LVOT Vmax:         26.80 cm/s LVOT Vmean:        21.000 cm/s LVOT VTI:          0.039 m LVOT/AV  VTI ratio: 0.54  AORTA Ao Root diam: 2.83 cm MITRAL VALVE                TRICUSPID VALVE MV Area (PHT): 5.16 cm     TR Peak grad:   32.7 mmHg MV Decel Time: 147 msec     TR Vmax:        286.00 cm/s MV E velocity: 118.00 cm/s                             SHUNTS                             Systemic VTI:  0.04 m                             Systemic Diam: 2.10 cm Neoma Laming MD Electronically signed by Neoma Laming MD Signature Date/Time: 04/19/2020/8:09:07 AM    Final       ASSESSMENT/PLAN    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  Will dc cefepime. continue zithromax only 250 PO.     - prednisone 10 mg po daily and florinef .1 tid      Acute on chronic systolic CHF   - he has 1+ LE edema but he has effusions bilaterally  -S/P Renal evaluation - appreciate input - holding diuresis, dc nephrotoxins no HD for now   Cardiorenal syndrome  -renal consult - appreciate input  - palliative on case appreciate input    Severe protein calorie malnutrition  bitemporal wasting and peripheral muscle wasting.  -Nutrition and PT/OT   Thank you for allowing me to participate in the care of this patient.   Patient/Family are satisfied with care plan and all questions have been answered.  This document was prepared using Dragon voice recognition software and may include unintentional dictation errors.     Ottie Glazier, M.D.  Division of [place]

## 2020-05-08 NOTE — Progress Notes (Signed)
Speech Language Pathology Treatment: Dysphagia  Patient Details Name: Jason Fitzgerald MRN: [id number] DOB: 23-Jun-1950 Today's Date: 05/08/2020 Time: [id number] SLP Time Calculation (min) (ACUTE ONLY): 45 min  Assessment / Plan / Recommendation Clinical Impression  Pt seen for ongoing assessment of swallowing; trials to upgrade diet if possible. Pt stated he was "tired" of the "mushy" foods. He appears improved in energy and alertness -- sitting in chair at bedside. He was verbally responsive and able to follow instructions w/ min cues though less cooperative as the session continued. Pt is on [place] 2L; wbc wnl.  Pt explained general aspiration precautions and agreed verbally to the need for following them especially sitting upright for all oral intake and taking small sips/bites. Pt fed self trials of thin liquids, purees, and soft solid(graham crackers). No overt clinical s/s of aspiration were noted w/ any consistency; respiratory status remained calm and unlabored, vocal quality clear b/t trials. Pt sipped water and juice via Straw following instructions for single, small sips slowly. Oral phase appeared grossly [facility] for bolus management and timely A-P transfer for swallowing; oral clearing achieved w/ all consistencies and graham crackers given Time for full mastication(missing some dentition). Discussed the benefit of moistening foods for ease of mastication but pt declined dipping the crackers in applesauce. NSG arrived shortly to give Meds -- Pills were attempted 1 at a time w/ water sips d/t pt's Refusal to take them in applesauce or ice cream. Pt appeared to tolerate 1 pill w/ water while in room(w/ nsg present) well; suggested continue w/ 1 pill at a time or give Whole in a puree if needed to provide cohesion for swallowing tablets. Pt seemed agitated in being asked to take his Meds; NSG addressing this w/ him, MD. Pt has been refusing Meds and some care.   Recommend upgrade to Dysphagia level 3  diet (mech soft) w/ gravies added to moisten foods; Thin liquids. Recommend general aspiration precautions; Pills 1 at a time w/ water or Whole in Puree if deemed needed per NSG for safer swallowing; tray setup and positioning assistance for meals. ST services can be available for any further education as needed while admitted. NSG updated. Precautions posted at bedside.       HPI HPI: Per admitting H&P "Jason Fitzgerald is a 70 y.o. male with medical history significant for hypertension, interstitial lung disease, heart failure reduced ejection fraction, hyperlipidemia, history of atrial fibrillation, presents emergency department by EMS for shortness of breath.     Mr. Sara states that his shortness of breath is worse with exertion and laying flat.  He reports that shortness of breath started this morning.  He denies fever, chills, nausea, vomiting, diarrhea, chest pain.  He does endorse coughing of white phlegm.  Denies weight gain.  He denies changes to diet. He states this feels like his previous episodes of heart failure exacerbation.     He states to me that he has not had his medication for 1 to 2 weeks.  He states he ran out.  He does endorse to me that he drinks alcohol daily and last week and he had a party/guess over.  "Jason Fitzgerald is a 70 y.o. male with medical history significant for hypertension, interstitial lung disease, heart failure reduced ejection fraction, hyperlipidemia, history of atrial fibrillation, presents emergency department by EMS for shortness of breath.     Mr. Barstow states that his shortness of breath is worse with exertion and laying flat.  He reports that  shortness of breath started this morning.  He denies fever, chills, nausea, vomiting, diarrhea, chest pain.  He does endorse coughing of white phlegm.  Denies weight gain.  He denies changes to diet. He states this feels like his previous episodes of heart failure exacerbation.     He states to me that he has not had his  medication for 1 to 2 weeks.  He states he ran out.  He does endorse to me that he drinks alcohol daily and last week and he had a party/guess over."      SLP Plan  All goals met       Recommendations  Diet recommendations: Dysphagia 3 (mechanical soft);Thin liquid Liquids provided via: Cup;Straw (small sips) Medication Administration: Whole meds with liquid (one at a time w/ sips of water, or Whole in Puree) Supervision: Patient able to self feed;Intermittent supervision to cue for compensatory strategies (setup) Compensations: Minimize environmental distractions;Small sips/bites;Slow rate;Lingual sweep for clearance of pocketing;Follow solids with liquid Postural Changes and/or Swallow Maneuvers: Out of bed for meals;Seated upright 90 degrees;Upright 30-60 min after meal                General recommendations:  (Dietician f/u) Oral Care Recommendations: Oral care BID;Oral care before and after PO;Patient independent with oral care Follow up Recommendations: None SLP Visit Diagnosis: Dysphagia, unspecified (R13.10) Plan: All goals met       GO                 Orinda Kenner, MS, CCC-SLP Speech Language Pathologist Rehab Services [phone] [facility] 05/08/2020, 4:49 PM

## 2020-05-08 NOTE — Progress Notes (Signed)
[facility]) Hospital Liaison note:  This is a pending outpatient-based Palliative Care patient. Will continue to follow for disposition.  Please call with any outpatient palliative questions or concerns.  Thank you, Lorelee Market, LPN [facility] Liaison [phone]

## 2020-05-08 NOTE — Progress Notes (Signed)
Mobility Specialist - Progress Note   05/08/20 1200  Mobility  Activity Transferred:  Bed to chair  Level of Assistance Moderate assist, patient does 50-74%  Assistive Device Front wheel walker  Distance Ambulated (ft) 2 ft  Mobility Response Tolerated well  Mobility performed by Mobility specialist  $Mobility charge 1 Mobility    Pt transferred B-C with RW. MinA to EOB. Seated therex prior to OOB activity for standing preparation. ModA to stand at bedside (2 attempts) successful with use of momentum. VC for hand placement and sequencing. Encouragement for participation offered. Aox3. Chair alarm set.   Kathee Delton Mobility Specialist 05/08/20, 12:11 PM

## 2020-05-08 NOTE — Progress Notes (Signed)
[facility]  ROUNDING NOTE   Subjective:   Jason Fitzgerald is a 71 year old male with past medical history of interstitial lung disease, heart failure with reduced ejection fraction, hypertension, hyperlipidemia, and A. fib.  He presents to the emergency room with complaints of shortness of breath.  He is admitted for possible volume overload related to heart failure.  We have been consulted due to worsening renal function.  In the setting of diuresing and IV contrast, patient's creatinine has increased.  Patient seen resting in bed States he's taking his morning nap Alert and able to answer questions Tolerates meals but has poor appetite Says he has to get used to "grinded up food." Denies nausea Denies shortness of breath   Objective:  Vital signs in last 24 hours:  Temp:  [97.6 F (36.4 C)-98.1 F (36.7 C)] 98.1 F (36.7 C) (04/06 1213) Pulse Rate:  [79-108] 86 (04/06 1213) Resp:  [17-22] 18 (04/06 1213) BP: (104-112)/(79-99) 110/79 (04/06 1213) SpO2:  [95 %-100 %] 95 % (04/06 1213) Weight:  [78.5 kg] 78.5 kg (04/06 0408)  Weight change:  Filed Weights   05/05/20 0500 05/06/20 0412 05/08/20 0408  Weight: 81.7 kg 78.6 kg 78.5 kg    Intake/Output: I/O last 3 completed shifts: In: 600 [P.O.:600] Out: 1300 [Urine:1300]   Intake/Output this shift:  Total I/O In: 240 [P.O.:240] Out: 700 [Urine:700]  Physical Exam: General: NAD, resting in bed  Head: Normocephalic, atraumatic. Moist oral mucosal membranes  Lungs:  Clear to auscultation  Heart: Regular rate and rhythm  Abdomen:  Soft, nontender,   Extremities:  trace peripheral edema.  Neurologic: Alert, able to answer simple questions  Skin: No lesions    Basic Metabolic Panel: Recent Labs  Lab 05/04/20 0429 05/05/20 2037 05/06/20 0843 05/07/20 0851 05/08/20 0853  NA 133* 134* 136 134* 135  K 3.7 4.5 3.7 3.9 4.3  CL 96* 97* 99 97* 100  CO2 24 21* 25 23 18*  GLUCOSE 113* 131* 115* 126*  106*  BUN 50* 65* 72* 82* 89*  CREATININE 1.71* 2.41* 2.73* 3.04* 3.05*  CALCIUM 8.4* 8.5* 8.7* 8.7* 8.6*  MG 2.4 2.6* 2.7* 2.9* 3.2*    Liver Function Tests: No results for input(s): AST, ALT, ALKPHOS, BILITOT, PROT, ALBUMIN in the last 168 hours. No results for input(s): LIPASE, AMYLASE in the last 168 hours. No results for input(s): AMMONIA in the last 168 hours.  CBC: Recent Labs  Lab 05/04/20 0429 05/05/20 2037 05/06/20 0728 05/07/20 0851 05/08/20 0853  WBC 10.5 9.1 10.5 7.1 10.3  HGB 11.3* 12.0* 11.4* 11.2* 12.2*  HCT 33.0* 35.2* 32.9* 33.1* 36.9*  MCV 78.8* 78.7* 77.8* 78.6* 81.1  PLT 172 196 221 184 149*    Cardiac Enzymes: No results for input(s): CKTOTAL, CKMB, CKMBINDEX, TROPONINI in the last 168 hours.  BNP: Invalid input(s): POCBNP  CBG: No results for input(s): GLUCAP in the last 168 hours.  Microbiology: Results for orders placed or performed during the hospital encounter of 04/29/20  Culture, blood (routine x 2)     Status: None   Collection Time: 04/29/20  9:34 AM   Specimen: BLOOD  Result Value Ref Range Status   Specimen Description BLOOD RIGHT ARM  Final   Special Requests   Final    BOTTLES DRAWN AEROBIC AND ANAEROBIC Blood Culture adequate volume   Culture   Final    NO GROWTH 5 DAYS Performed at [facility], [address] [place], [place] [address]  Report Status 05/04/2020 FINAL  Final  Culture, blood (routine x 2)     Status: None   Collection Time: 04/29/20  9:34 AM   Specimen: BLOOD  Result Value Ref Range Status   Specimen Description BLOOD BLOOD LEFT ARM  Final   Special Requests   Final    BOTTLES DRAWN AEROBIC AND ANAEROBIC Blood Culture adequate volume   Culture   Final    NO GROWTH 5 DAYS Performed at [facility], [address], [place], [place] [address]    Report Status 05/04/2020 FINAL  Final  SARS CORONAVIRUS 2 (TAT 6-24 HRS) Nasopharyngeal Nasopharyngeal Swab     Status: None   Collection  Time: 04/29/20  9:40 AM   Specimen: Nasopharyngeal Swab  Result Value Ref Range Status   SARS Coronavirus 2 NEGATIVE NEGATIVE Final    Comment: (NOTE) SARS-CoV-2 target nucleic acids are NOT DETECTED.  The SARS-CoV-2 RNA is generally detectable in upper and lower respiratory specimens during the acute phase of infection. Negative results do not preclude SARS-CoV-2 infection, do not rule out co-infections with other pathogens, and should not be used as the sole basis for treatment or other patient management decisions. Negative results must be combined with clinical observations, patient history, and epidemiological information. The expected result is Negative.  Fact Sheet for Patients: SugarRoll.be  Fact Sheet for Healthcare Providers: https://www.woods-mathews.com/  This test is not yet approved or cleared by the [place] FDA and  has been authorized for detection and/or diagnosis of SARS-CoV-2 by FDA under an Emergency Use Authorization (EUA). This EUA will remain  in effect (meaning this test can be used) for the duration of the COVID-19 declaration under Se ction 564(b)(1) of the Act, 21 U.S.C. section 360bbb-3(b)(1), unless the authorization is terminated or revoked sooner.  Performed at [facility], [place] [address] [place], [place] [address]     Coagulation Studies: No results for input(s): LABPROT, INR in the last 72 hours.  Urinalysis: No results for input(s): COLORURINE, LABSPEC, PHURINE, GLUCOSEU, HGBUR, BILIRUBINUR, KETONESUR, PROTEINUR, UROBILINOGEN, NITRITE, LEUKOCYTESUR in the last 72 hours.  Invalid input(s): APPERANCEUR    Imaging: No results found.   Medications:    . atorvastatin  40 mg Oral Daily  . calcium carbonate  400 mg of elemental calcium Oral TID WC  . collagenase   Topical Daily  . enoxaparin (LOVENOX) injection  30 mg Subcutaneous Q24H  . feeding supplement (NEPRO CARB STEADY)  237  mL Oral BID BM  . fludrocortisone  0.1 mg Oral Daily  . folic acid  1 mg Oral Daily  . midodrine  10 mg Oral TID WC  . multivitamin with minerals  1 tablet Oral Daily  . polyethylene glycol  17 g Oral Daily  . predniSONE  10 mg Oral Q breakfast  . thiamine  100 mg Oral Daily   Or  . thiamine  100 mg Intravenous Daily   acetaminophen, ondansetron **OR** ondansetron (ZOFRAN) IV  Assessment/ Plan:  Jason Fitzgerald is a 70 y.o.  male with past medical history of interstitial lung disease, heart failure with reduced ejection fraction, hypertension, hyperlipidemia, and A. fib.   1. Acute Kidney Injury baseline creatinine 0.64 on March 29.  Acute kidney injury secondary to IV contrast and diuresis.  Current level 3.05 Avoid nephrotoxic medications and treatments Continue to hold diuretics Encourage oral nutrition-speech will re-evaluate dysphagia No acute need for dialysis at this time Continue Midodrine to prevent hypotension  Lab Results  Component Value Date   CREATININE 3.05 (H) 05/08/2020   CREATININE 3.04 (H) 05/07/2020   CREATININE 2.73 (H) 05/06/2020    Intake/Output Summary (Last 24 hours) at 05/08/2020 1236 Last data filed at 05/08/2020 1214 Gross per 24 hour  Intake 480 ml  Output 1600 ml  Net -1120 ml   2. Acute on Chronic Heart failure with EF <20% Refused LifeVest and ran out of medications at home DNR status Given Lasix in ED ECHO 04/19/20-severely decreased function of left ventricle, EF <20, and global hypokinesis.Grade 2 diastolic dysfunction.   3. Shortness of breath Lasix in ED Currently on supplemental oxygen    LOS: 8 Jason Fitzgerald 4/6/202212:36 PM

## 2020-05-08 NOTE — Progress Notes (Signed)
Mobility Specialist - Progress Note   05/08/20 1400  Mobility  Activity Transferred:  Chair to bed  Level of Assistance Minimal assist, patient does 75% or more  Assistive Device Front wheel walker  Distance Ambulated (ft) 2 ft  Mobility Response Tolerated well  Mobility performed by Mobility specialist  $Mobility charge 1 Mobility    Pt transferred chair-bed with RW. Successful STS transfer on 1st attempt with use of momentum. Tolerated sitting in recliner for 1.5hrs. Returned supine with minA and +2 assist for HOB positioning.    Kathee Delton Mobility Specialist 05/08/20, 2:31 PM

## 2020-05-09 DIAGNOSIS — R0602 Shortness of breath: Secondary | ICD-10-CM | POA: Diagnosis not present

## 2020-05-09 DIAGNOSIS — Z7189 Other specified counseling: Secondary | ICD-10-CM | POA: Diagnosis not present

## 2020-05-09 LAB — BASIC METABOLIC PANEL
Anion gap: 15 (ref 5–15)
BUN: 92 mg/dL — ABNORMAL HIGH (ref 8–23)
CO2: 22 mmol/L (ref 22–32)
Calcium: 8.9 mg/dL (ref 8.9–10.3)
Chloride: 98 mmol/L (ref 98–111)
Creatinine, Ser: 2.46 mg/dL — ABNORMAL HIGH (ref 0.61–1.24)
GFR, Estimated: 28 mL/min — ABNORMAL LOW (ref >60)
Glucose, Bld: 166 mg/dL — ABNORMAL HIGH (ref 70–99)
Potassium: 4.2 mmol/L (ref 3.5–5.1)
Sodium: 135 mmol/L (ref 135–145)

## 2020-05-09 LAB — CBC
HCT: 35.1 % — ABNORMAL LOW (ref 39.0–52.0)
Hemoglobin: 11.6 g/dL — ABNORMAL LOW (ref 13.0–17.0)
MCH: 26.5 pg (ref 26.0–34.0)
MCHC: 33 g/dL (ref 30.0–36.0)
MCV: 80.1 fL (ref 80.0–100.0)
Platelets: 150 10*3/uL (ref 150–400)
RBC: 4.38 MIL/uL (ref 4.22–5.81)
RDW: 16 % — ABNORMAL HIGH (ref 11.5–15.5)
WBC: 9.4 10*3/uL (ref 4.0–10.5)
nRBC: 0.7 % — ABNORMAL HIGH (ref 0.0–0.2)

## 2020-05-09 LAB — MAGNESIUM: Magnesium: 3.1 mg/dL — ABNORMAL HIGH (ref 1.7–2.4)

## 2020-05-09 NOTE — Progress Notes (Addendum)
PROGRESS NOTE    GOTTI ALWIN  GGE:[id number] DOB: 1950-06-04 DOA: 04/29/2020 PCP: Steele Sizer, MD   Brief Narrative:  [name] a 70 y.o.malewith medical history significant forhypertension, interstitial lung disease, heart failure reduced ejection fraction, hyperlipidemia, history of atrial fibrillation, presents emergency department by EMS for shortness of breath.  Patient condition has been progressively getting worse over the last week, he has a severe short of breath with minimal exertion, he has been sleeping in a recliner, frequent paroxysmal nocturnal dyspnea.  He also drinks alcohol and he used cocaine. Upon arriving the hospital, he was hypotensive.    Lasix been on hold due to rising kidney function.  Case has been discussed with nephrology.  Patient did have contrast exposure on 3/28 and had a subsequent rise in creatinine since that time.  AKI felt to be multifactorial due to diuresis in the setting of contrast exposure.  The patient is becoming frustrated about continued hospital stay.  I explained that we are in the process of finding a skilled nursing facility for him.  He expressed understanding.  Kidney function on 4/7 is pending   Assessment & Plan:   Principal Problem:   Shortness of breath Active Problems:   Arteriosclerosis of coronary artery   HTN (hypertension)   Interstitial lung disease (HCC)   Chronic cough   Degenerative arthritis of lumbar spine   Chronic systolic CHF (congestive heart failure) (HCC)   AF (paroxysmal atrial fibrillation) (HCC)   Rheumatoid arthritis involving multiple sites with positive rheumatoid factor (HCC)   Fibrosis, pulmonary, interstitial, diffuse (HCC)   GERD (gastroesophageal reflux disease)   COPD (chronic obstructive pulmonary disease) (HCC)   CHF (congestive heart failure) (HCC)   Anemia   Atrial fibrillation (HCC)   Non compliance w medication regimen   DNR (do not resuscitate)   Prolonged QT  interval   Acute on chronic systolic (congestive) heart failure (HCC)   Sepsis ([place])   Aspiration pneumonia of both lower lobes due to gastric secretions (HCC)  Goals of care - Palliative care was involved during last hospitalization and pt reportedly elected to be DNR.  However, family asked for pt to be switched back to Full Code during this hospitalization.  - palliative care consulted during this hospitalization --Follow-up and recommendations appreciated --Given chronic advanced illness patient likely appropriate for DNR status --Also would recommend outpatient palliative care follow-up.  Will include in dc summary   AKI, not POA --Cr 0.99 on presentation, trending up to 3.05 as of 4/6 --likely due to diuresis while intravascularly depleted, hypotension --nephrology consulted --Likely multifactorial secondary to diuresis, patient also received IV contrast on 3/28 4/7: Patient refused a.m. labs.  Agreed on my rounds.  Labs currently pending. Plan: --Hold diuresis and Farxiga --cont midodrine and avoid hypotension --Follow up serum creatinine --Will be medically stable for discharge once we can confirm the creatinine is downtrending  Acute on chronic systolic congestive heart failure. --Patient has evidence of volume overload, significant paroxysmal nocturnal dyspnea, leg edema.  He has intermittent hypotension due to severe LV dysfunction.  Ejection fraction less than 20%. --Patient overall condition appears to be end-stage congestive heart failure.  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 --transition from lasix gtt to oral lasix 20 mg BID, then held due to AKI.   Plan: --Hold diuresis due to worsening AKI and pt currently not hypoxic --Hold Farxiga 2/2 AKI --Hold BB due to hypotension, per pulm --HoldEntrestod/t hypotension.  --encourage oral hydration   Paroxysmal  atrial fibrillation. --currently rate controlled --Due to pulmonary fibrosis,cannot be  placed on amiodarone. 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 Severe hypoalbuminemia. Intermittent hypotension. Coagulopathy secondary to liver failure. Patient also has liver function changes with elevated bilirubin.  This is due to right-sided congestive heart failure. Patient condition is very serious, very high risk of mortality.  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 --consulted pulm, started on Florinef and predniosne --cont Florinef 0.1 mg daily --cont prednisone 10 mg daily, per pulm  #2.  Severe sepsis. Aspiration pneumonia. Patient met sepsis criteria at time admission, he had tachycardia, tachypnea, hypotension, leukocytosis.  He also has significant lactic acidosis. Due to alcohol drinking, patient has high risk of aspiration pneumonia.  Patient also has Pseudomonas risk due to interstitial lung disease.   --procal elevated --finished 5 days of cefepime -No further antibiotics  4.  Alcohol abuse. Cocaine abuse. --no alcohol withdrawal --Continue thiamine and folic acid. --Counsel patient on cessation  #6.  Severe protein calorie malnutrition. Poor oral intake and possible swallow issues Patient had significant muscle atrophy, looks malnourished.   --refused all Ensure supplement offered.  Said that he had to be on it for years and got sick of it. Plan: --SLP, rec Dys 1  #7.  Hypokalemia. --monitor and replete PRN  Constipation, resolved --cont Miralax 17g daily  Unstageable ulcers over left shoulder, sacral areas and bilateral buttocks, POA --wound care consulted, recommended surgical consult --Surgery consult on 3/31, does not recommend debridement.  Whole-body pain --avoid narcotics --can't give NSAIDs 2/2 AKI --Tylenol 1g  TID PRN --can apply lidocaine patch if pt can localize pain   DVT prophylaxis: SQ Lovenox Code Status: Full Family Communication: VM left for sister Regino Schultze [phone] on 4/7 Disposition Plan: Status is: Inpatient  Remains inpatient appropriate because:Inpatient level of care appropriate due to severity of illness   Dispo: The patient is from: Home              Anticipated d/c is to: SNF              Patient currently is not medically stable to d/c.   Difficult to place patient No   Once we can confirm that he has improving kidney function he will be medically stable for discharge.  Unfortunately we do not have any bed offers for skilled nursing facility yet.    Level of care: Progressive Cardiac  Consultants:  Cardiology Nephrology Pulmonary  Procedures:   None  Antimicrobials:  None   Subjective: Patient seen and examined.  Sitting up in bed.  No visible distress.  Frustrated about continued hospital stay.  Objective: Vitals:   05/08/20 1642 05/09/20 0546 05/09/20 0813 05/09/20 1102  BP: 108/76 107/80 102/76 105/82  Pulse: 89 79 84 85  Resp: _0 Temp: 98.4 F (36.9 C) (!) 97.5 F (36.4 C) 97.9 F (36.6 C) 97.9 F (36.6 C)  TempSrc: Oral Oral    SpO2: 96% 98% 97% 100%  Weight:  77.8 kg    Height:        Intake/Output Summary (Last 24 hours) at 05/09/2020 1312 Last data filed at 05/09/2020 1000 Gross per 24 hour  Intake 120 ml  Output 400 ml  Net -280 ml   Filed Weights   05/06/20 0412 05/08/20 0408 05/09/20 0546  Weight:  78.6 kg 78.5 kg 77.8 kg    Examination:  General exam: No acute distress.  Appears frail and chronically ill Respiratory system: Poor respiratory effort.  Bilateral crackles.  Normal work of breathing.  Room air Cardiovascular system: S1 & S2 heard, RRR. No JVD, murmurs, rubs, gallops or clicks. No pedal edema. Gastrointestinal system: Thin/scaphoid, nontender, nondistended, normal bowel sounds Central nervous system:  Alert, oriented x2, no focal deficits Extremities: Decreased power bilateral upper and lower extremities Skin: Sacral ulcer, left scapular ulcer Psychiatry: Judgement and insight appear impaired. Mood & affect flattened.     Data Reviewed: I have personally reviewed following labs and imaging studies  CBC: Recent Labs  Lab 05/04/20 0429 05/05/20 2037 05/06/20 0728 05/07/20 0851 05/08/20 0853  WBC 10.5 9.1 10.5 7.1 10.3  HGB 11.3* 12.0* 11.4* 11.2* 12.2*  HCT 33.0* 35.2* 32.9* 33.1* 36.9*  MCV 78.8* 78.7* 77.8* 78.6* 81.1  PLT 172 196 221 184 473*   Basic Metabolic Panel: Recent Labs  Lab 05/04/20 0429 05/05/20 2037 05/06/20 0843 05/07/20 0851 05/08/20 0853  NA 133* 134* 136 134* 135  K 3.7 4.5 3.7 3.9 4.3  CL 96* 97* 99 97* 100  CO2 24 21* 25 23 18*  GLUCOSE 113* 131* 115* 126* 106*  BUN 50* 65* 72* 82* 89*  CREATININE 1.71* 2.41* 2.73* 3.04* 3.05*  CALCIUM 8.4* 8.5* 8.7* 8.7* 8.6*  MG 2.4 2.6* 2.7* 2.9* 3.2*   GFR: Estimated Creatinine Clearance: 25.4 mL/min (A) (by C-G formula based on SCr of 3.05 mg/dL (H)). Liver Function Tests: No results for input(s): AST, ALT, ALKPHOS, BILITOT, PROT, ALBUMIN in the last 168 hours. No results for input(s): LIPASE, AMYLASE in the last 168 hours. No results for input(s): AMMONIA in the last 168 hours. Coagulation Profile: No results for input(s): INR, PROTIME in the last 168 hours. Cardiac Enzymes: No results for input(s): CKTOTAL, CKMB, CKMBINDEX, TROPONINI in the last 168 hours. BNP (last 3 results) No results for input(s): PROBNP in the last 8760 hours. HbA1C: No results for input(s): HGBA1C in the last 72 hours. CBG: No results for input(s): GLUCAP in the last 168 hours. Lipid Profile: No results for input(s): CHOL, HDL, LDLCALC, TRIG, CHOLHDL, LDLDIRECT in the last 72 hours. Thyroid Function Tests: No results for input(s): TSH, T4TOTAL, FREET4, T3FREE, THYROIDAB in the last 72 hours. Anemia Panel: No results for  input(s): VITAMINB12, FOLATE, FERRITIN, TIBC, IRON, RETICCTPCT in the last 72 hours. Sepsis Labs: No results for input(s): PROCALCITON, LATICACIDVEN in the last 168 hours.  No results found for this or any previous visit (from the past 240 hour(s)).       Radiology Studies: No results found.      Scheduled Meds: . atorvastatin  40 mg Oral Daily  . calcium carbonate  400 mg of elemental calcium Oral TID WC  . collagenase   Topical Daily  . enoxaparin (LOVENOX) injection  30 mg Subcutaneous Q24H  . feeding supplement (NEPRO CARB STEADY)  237 mL Oral BID BM  . fludrocortisone  0.1 mg Oral Daily  . folic acid  1 mg Oral Daily  . midodrine  10 mg Oral TID WC  . multivitamin with minerals  1 tablet Oral Daily  . polyethylene glycol  17 g Oral Daily  . predniSONE  10 mg Oral Q breakfast  . thiamine  100 mg Oral Daily   Or  . thiamine  100 mg Intravenous Daily   Continuous Infusions:   LOS: 9 days  Time spent: 15 minutes    Sidney Ace, MD [facility] Pager [phone]  If 7PM-7AM, please contact night-coverage 05/09/2020, 1:12 PM

## 2020-05-09 NOTE — Progress Notes (Signed)
Daily Progress Note   Patient Name: Jason Fitzgerald       Date: 05/09/2020 DOB: 08-May-1950  Age: 70 y.o. MRN#: [id number] Attending Physician: Sidney Ace, MD Primary Care Physician: Steele Sizer, MD Admit Date: 04/29/2020  Reason for Consultation/Follow-up: Establishing goals of care  Subjective: Patient is walking from door to sit in bedside chair with PT and OT. He appears very weak and frail. Upon PT/OT leaving, he stated he wanted to get back in bed. He has been declining some of the care staff is attempting to provide.    We discussed his diagnosis, prognosis, GOC, EOL wishes disposition and options.  Created space and opportunity for patient  to explore thoughts and feelings regarding current medical information.   A detailed discussion was had today regarding advanced directives.  Concepts specific to code status, artifical feeding and hydration, IV antibiotics and rehospitalization were discussed.  The difference between an aggressive medical intervention path and a comfort care path was discussed.  Values and goals of care important to patient and family were attempted to be elicited.  Discussed limitations of medical interventions to prolong quality of life in some situations and discussed the concept of human mortality.  He states he wants to do what he can to live longer. He is amenable to SNF. He states he would like CPR. He states there are no boundaries on a level of pain or suffering that would prompt him to transition to comfort care. He states he has declined some of the care offered, but only because he did not want the care provided at that particular time.   Spoke with daughter who has been updated.        Length of Stay: 9  Current Medications: Scheduled  Meds:  . atorvastatin  40 mg Oral Daily  . calcium carbonate  400 mg of elemental calcium Oral TID WC  . collagenase   Topical Daily  . enoxaparin (LOVENOX) injection  30 mg Subcutaneous Q24H  . feeding supplement (NEPRO CARB STEADY)  237 mL Oral BID BM  . fludrocortisone  0.1 mg Oral Daily  . folic acid  1 mg Oral Daily  . midodrine  10 mg Oral TID WC  . multivitamin with minerals  1 tablet Oral Daily  . polyethylene glycol  17 g Oral Daily  .  predniSONE  10 mg Oral Q breakfast  . thiamine  100 mg Oral Daily   Or  . thiamine  100 mg Intravenous Daily    Continuous Infusions:   PRN Meds: acetaminophen, ondansetron **OR** ondansetron (ZOFRAN) IV  Physical Exam Pulmonary:     Effort: Pulmonary effort is normal.  Neurological:     Mental Status: He is alert.             Vital Signs: BP 105/82 (BP Location: Right Arm)   Pulse 85   Temp 97.9 F (36.6 C)   Resp 18   Ht 6' (1.829 m)   Wt 77.8 kg   SpO2 100%   BMI 23.26 kg/m  SpO2: SpO2: 100 % O2 Device: O2 Device: Room Air O2 Flow Rate:    Intake/output summary:   Intake/Output Summary (Last 24 hours) at 05/09/2020 1450 Last data filed at 05/09/2020 1400 Gross per 24 hour  Intake 120 ml  Output 850 ml  Net -730 ml   LBM: Last BM Date: 05/08/20 Baseline Weight: Weight: 74.8 kg Most recent weight: Weight: 77.8 kg           Patient Active Problem List   Diagnosis Date Noted  . Acute on chronic systolic (congestive) heart failure ([place]) 04/30/2020  . Sepsis ([place]) 04/30/2020  . Aspiration pneumonia of both lower lobes due to gastric secretions ([place]) 04/30/2020  . Shortness of breath 04/29/2020  . Prolonged QT interval 04/29/2020  . DNR (do not resuscitate) 04/22/2020  . Atrial fibrillation with rapid ventricular response ([place]) 04/18/2020  . Non compliance w medication regimen   . Atrial fibrillation ([place]) 12/09/2019  . Atrial fibrillation with RVR ([place]) 12/08/2019  . Slurred speech 12/08/2019  . Chest pain  12/08/2019  . Elevated troponin 12/08/2019  . Acute CHF (congestive heart failure) ([place]) 08/05/2019  . Diarrhea   . Dizziness 12/17/2018  . Hypotension 12/17/2018  . Hypophosphatemia 12/17/2018  . Hypomagnesemia 12/17/2018  . Positive D-dimer 12/17/2018  . Abnormal LFTs (liver function tests) 12/17/2018  . Anemia 12/17/2018  . Fibrosis, pulmonary, interstitial, diffuse ([place])   . GERD (gastroesophageal reflux disease)   . COPD (chronic obstructive pulmonary disease) ([place])   . CHF (congestive heart failure) ([place])   . Encounter for screening colonoscopy   . Polyp of transverse colon   . Internal hemorrhoids   . Hypokalemia 02/03/2018  . History of alcoholism ([place]) 12/08/2017  . Rheumatoid arthritis involving multiple sites with positive rheumatoid factor ([place]) 10/13/2017  . Chronic systolic CHF (congestive heart failure) ([place]) 03/12/2017  . AF (paroxysmal atrial fibrillation) ([place]) 03/12/2017  . Arthritis of left knee 08/01/2016  . 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 Chronic cough 05/29/2015  . CAFL (chronic airflow limitation) ([place]) 10/24/2014  . Arteriosclerosis of coronary artery 10/24/2014  . History of fundoplication 22/97/9892  . HTN (hypertension) 10/24/2014  . LAD (lymphadenopathy), mediastinal 07/13/2013  . Interstitial lung disease ([place]) 06/19/2013  . 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     Code Status:    Code Status Orders  (From admission, onward)         Start     Ordered   05/01/20 1344  Full code  Continuous        05/01/20 1343        Code Status History    Date Active Date Inactive Code Status Order ID Comments User Context  04/29/2020 1256 05/01/2020 1343 DNR [id number]  Criss Alvine, DO ED   04/22/2020 0956 04/22/2020 1825 DNR [id number]  Debbe Odea, MD Inpatient   04/18/2020 1008 04/22/2020 0956 Full Code [id number]  Collier Bullock, MD ED   12/08/2019 1430 12/14/2019 0120 Full Code [id number]  Ivor Costa, MD ED   08/05/2019 0423 08/07/2019 2106 Full Code [id number]  Mansy, Arvella Merles, MD ED   12/17/2018 1453 12/20/2018 1529 Full Code [id number]  Para Skeans, MD ED   02/03/2018 1520 02/05/2018 1811 Full Code [id number]  Saundra Shelling, MD ED   03/04/2017 1231 03/06/2017 1707 Full Code [id number]  Demetrios Loll, MD ED   10/26/2014 1128 10/26/2014 1500 Full Code [id number]  Hubbard Robinson, MD Outpatient   Advance Care Planning Activity      Prognosis:  < 6 months    Care plan was discussed with primary MD.   Thank you for allowing the Palliative Medicine Team to assist in the care of this patient.   Total Time 25 min Prolonged Time Billed  no       Greater than 50%  of this time was spent counseling and coordinating care related to the above assessment and plan.  Asencion Gowda, NP  Please contact Palliative Medicine Team phone at [phone] for questions and concerns.

## 2020-05-09 NOTE — Progress Notes (Signed)
Physical Therapy Treatment Patient Details Name: Jason Fitzgerald MRN: [id number] DOB: July 07, 1950 Today's Date: 05/09/2020    History of Present Illness 70 y.o. male with medical history significant for hypertension, interstitial lung disease, heart failure reduced ejection fraction, hyperlipidemia, history of atrial fibrillation, presents emergency department by EMS for shortness of breath.    PT Comments    Pt was long sitting in bed upon arriving." I just got into it with my sister.Its been a bad day already." Encouraged participation and OOB to recliner for breakfast. PT agreeable however once sitting EOB quickly gets agitated and returns to laying in bed. " Yall trying to kill me." Lengthy discussion about need for taking medications, participation, and need for OOB to help him improve function. He eventually agrees to taking medications and lab draw.Pt required mod assist to exit bed but easily returns to supine with min assist.C/O severe sacral pain. Discussed need for repositioning and OOB to improve sacral pain. Overall pt has been self limiting over admission. He will greatly benefit from continued skilled PT going forward to assist him to PLOF. Recommend SNF once deemed medically stable.    Follow Up Recommendations  SNF     Equipment Recommendations  None recommended by PT       Precautions / Restrictions Precautions Precautions: Fall Restrictions Weight Bearing Restrictions: No    Mobility  Bed Mobility Overal bed mobility: Needs Assistance Bed Mobility: Supine to Sit;Sit to Supine     Supine to sit: Mod assist;HOB elevated Sit to supine: Min assist;HOB elevated   General bed mobility comments: Pt required increased time, max encouragement, and mod assist to achieve EOB short sit. Pt only tolerated sitting EOB x ~ 2 minutes with severe L lateral lean due to c/o sacral pain. Returns to supine with min assist." Yall trying to kill me."    Transfers      General  transfer comment: pt unwilling even with max encouragement to do so.      Balance Overall balance assessment: Needs assistance Sitting-balance support: Feet supported;Bilateral upper extremity supported Sitting balance-[name]: Poor Sitting balance - Comments:  (severe L lateral lean due to pain in sacral region)       Standing balance comment: pt resistive to even static sitting. unwilling to trial standing        Cognition Arousal/Alertness: Awake/alert Behavior During Therapy: Agitated;WFL for tasks assessed/performed Overall Cognitive Status: No family/caregiver present to determine baseline cognitive functioning        General Comments: Pt is A and oriented x 3. gets agitated easily but was able to redirect. Pt self limits session progression.             Pertinent Vitals/Pain Pain Assessment: 0-10 Pain Score: 8  Faces Pain Scale: Hurts whole lot Pain Location: sacral pain Pain Descriptors / Indicators: Grimacing;Guarding;Tender;Other (Comment);Moaning Pain Intervention(s): Limited activity within patient's tolerance;Monitored during session;Repositioned           PT Goals (current goals can now be found in the care plan section) Acute Rehab PT Goals Patient Stated Goal: get better Progress towards PT goals: Not progressing toward goals - comment (self limiting/pain limiting)    Frequency    Min 2X/week      PT Plan Current plan remains appropriate       AM-PAC PT "6 Clicks" Mobility   Outcome Measure  Help needed turning from your back to your side while in a flat bed without using bedrails?: A Lot Help needed moving from  lying on your back to sitting on the side of a flat bed without using bedrails?: A Lot Help needed moving to and from a bed to a chair (including a wheelchair)?: A Lot Help needed standing up from a chair using your arms (e.g., wheelchair or bedside chair)?: A Lot Help needed to walk in hospital room?: Total Help needed  climbing 3-5 steps with a railing? : Total 6 Click Score: 10    End of Session   Activity Tolerance: Patient limited by pain;Treatment limited secondary to agitation Patient left: in bed;with call bell/phone within reach;with bed alarm set Nurse Communication: Mobility status PT Visit Diagnosis: Other abnormalities of gait and mobility (R26.89);Unsteadiness on feet (R26.81);Pain;Muscle weakness (generalized) (M62.81)     Time: 9741-6384 PT Time Calculation (min) (ACUTE ONLY): 9 min  Charges:  $Therapeutic Activity: 8-22 mins                     Julaine Fusi PTA 05/09/20, 8:59 AM

## 2020-05-09 NOTE — Progress Notes (Signed)
Patient refused lab work this morning. I explained to pt that he was to be discharged to SNF today if his creatinine improved. Pt replied with "I'm sick of those needles and I am leaving at 9 today"

## 2020-05-09 NOTE — Care Management Important Message (Signed)
Important Message  Patient Details  Name: Jason Fitzgerald MRN: [id number] Date of Birth: 03/27/1950   Medicare Important Message Given:  Yes     [name] 05/09/2020, 12:56 PM

## 2020-05-09 NOTE — Progress Notes (Signed)
Patient refusing dressing changes to left shoulder and sacrum

## 2020-05-09 NOTE — Progress Notes (Addendum)
Occupational Therapy Treatment Patient Details Name: Jason Fitzgerald MRN: [id number] DOB: 12/23/1950 Today's Date: 05/09/2020    History of present illness 70 y.o. male with medical history significant for hypertension, interstitial lung disease, heart failure reduced ejection fraction, hyperlipidemia, history of atrial fibrillation, presents emergency department by EMS for shortness of breath.   OT comments  Pt seen for OT treatment this date to f/u re: safety with ADLs/ADL mobility. Pt stating he wants to get OOB, but then reluctant to initiate task with therapist. Pt requires MOD A to come to sitting and requires MIN/MOD A to CTS with RW for support. Pt engages in fxl mobility within the room with MIN/MOD A as well as increased time. Pt noted to keep hips slightly in flexion for majority of standing/fxl mobility. When cued to come to full stand, pt with c/o pain in sacral region. OT assists to optimize positioning by creating donut-like cushion out of blanket for the chair to decreased pressure through pt's sacrum. Pt does appear to tolerate well. Pt requires MIN A to change gown while seated in chair and requires MAX A to change socks using sit/lateral lean technique. Will continue to follow. Should pt continue to want to progress and participate with therapy, continue to anticipate that STR is most appropriate placement following acute stay.   Follow Up Recommendations  SNF;Supervision/Assistance - 24 hour    Equipment Recommendations  Other (comment) (defer)    Recommendations for Other Services      Precautions / Restrictions Precautions Precautions: Fall Restrictions Weight Bearing Restrictions: No       Mobility Bed Mobility Overal bed mobility: Needs Assistance Bed Mobility: Supine to Sit     Supine to sit: Mod assist;HOB elevated     General bed mobility comments: increased time, cues for use of bed rail, assist to square hips.    Transfers Overall transfer level:  Needs assistance Equipment used: Rolling walker (2 wheeled) Transfers: Sit to/from Stand Sit to Stand: Min assist;Mod assist         General transfer comment: increased time, use of arm rests, cues for hand placement, posterior support to encourage extending hips into full stand.    Balance Overall balance assessment: Needs assistance Sitting-balance support: Feet supported;Bilateral upper extremity supported Sitting balance-[name]: Fair Sitting balance - Comments: pt does lean to offset pain to his sacral region, but he is able to sustain midline with cues for hand placement.   Standing balance support: Bilateral upper extremity supported Standing balance-[name]: Poor Standing balance comment: pt resistive to even static sitting. unwilling to trial standing                           ADL either performed or assessed with clinical judgement   ADL Overall ADL's : Needs assistance/impaired                 Upper Body Dressing : Minimal assistance;Sitting;Cueing for sequencing Upper Body Dressing Details (indicate cue type and reason): tactile and verbal cues as well as re-directing as pt is primarily focused on his sore bottom Lower Body Dressing: Maximal assistance;Total assistance;Bed level Lower Body Dressing Details (indicate cue type and reason): to don socks. Pt is unable to meaningfully contribute d/t pain in his bottocks             Functional mobility during ADLs: Minimal assistance;Moderate assistance;Rolling walker (to door of room and back with cues for safe use of RW and  chair follow.)       Vision Baseline Vision/History: Wears glasses Wears Glasses: At all times Patient Visual Report: No change from baseline     Perception     Praxis      Cognition Arousal/Alertness: Awake/alert Behavior During Therapy: WFL for tasks assessed/performed;Agitated Overall Cognitive Status: No family/caregiver present to determine baseline  cognitive functioning                                 General Comments: requires gentle re-direction throughout as he is perseverative on his pain. While pt is oriented to self, place, and month/year, he is not oriented to all aspects of situation.        Exercises Other Exercises Other Exercises: OT educates pt re: importance of OOB activity including skin integrity and fashions a cushion for his seat with sacral opening that will improve positioning to relieve.   Shoulder Instructions       General Comments      Pertinent Vitals/ Pain       Pain Assessment: Faces Faces Pain Scale: Hurts whole lot Pain Location: sacral pain Pain Descriptors / Indicators: Grimacing;Guarding;Tender;Moaning Pain Intervention(s): Limited activity within patient's tolerance;Monitored during session  Home Living                                          Prior Functioning/Environment              Frequency  Min 2X/week        Progress Toward Goals  OT Goals(current goals can now be found in the care plan section)  Progress towards OT goals: Progressing toward goals  Acute Rehab OT Goals Patient Stated Goal: get better OT Goal Formulation: With patient Time For Goal Achievement: 05/14/20 Potential to Achieve Goals: Teller Discharge plan remains appropriate;Frequency remains appropriate    Co-evaluation                 AM-PAC OT "6 Clicks" Daily Activity     Outcome Measure   Help from another person eating meals?: A Little Help from another person taking care of personal grooming?: A Little Help from another person toileting, which includes using toliet, bedpan, or urinal?: Total Help from another person bathing (including washing, rinsing, drying)?: A Lot Help from another person to put on and taking off regular upper body clothing?: A Little Help from another person to put on and taking off regular lower body clothing?: Total 6 Click  Score: 13    End of Session Equipment Utilized During Treatment: Gait belt;Rolling walker  OT Visit Diagnosis: Muscle weakness (generalized) (M62.81);Unsteadiness on feet (R26.81)   Activity Tolerance Patient limited by pain   Patient Left with call bell/phone within reach;in chair;with chair alarm set   Nurse Communication Mobility status        Time: 2542-7062 OT Time Calculation (min): 23 min  Charges: OT General Charges $OT Visit: 1 Visit OT Treatments $Self Care/Home Management : 8-22 mins $Therapeutic Activity: 8-22 mins  Gerrianne Scale, MS, OTR/L ascom [phone] 05/09/20, 3:15 PM

## 2020-05-09 NOTE — Progress Notes (Addendum)
[facility]  ROUNDING NOTE   Subjective:   Jason Fitzgerald is a 70 year old male with past medical history of interstitial lung disease, heart failure with reduced ejection fraction, hypertension, hyperlipidemia, and A. fib.  He presents to the emergency room with complaints of shortness of breath.  He is admitted for possible volume overload related to heart failure.  We have been consulted due to worsening renal function.  In the setting of diuresing and IV contrast, patient's creatinine has increased.  Patient seen resting in bed States he only wanted juice for breakfast Denies nausea and shortness of breath   Objective:  Vital signs in last 24 hours:  Temp:  [97.5 F (36.4 C)-98.4 F (36.9 C)] 97.9 F (36.6 C) (04/07 1102) Pulse Rate:  [79-89] 85 (04/07 1102) Resp:  [17-18] 18 (04/07 1102) BP: (102-108)/(76-82) 105/82 (04/07 1102) SpO2:  [96 %-100 %] 100 % (04/07 1102) Weight:  [77.8 kg] 77.8 kg (04/07 0546)  Weight change: -0.7 kg Filed Weights   05/06/20 0412 05/08/20 0408 05/09/20 0546  Weight: 78.6 kg 78.5 kg 77.8 kg    Intake/Output: I/O last 3 completed shifts: In: 240 [P.O.:240] Out: 1100 [Urine:1100]   Intake/Output this shift:  Total I/O In: 120 [P.O.:120] Out: 450 [Urine:450]  Physical Exam: General: NAD, resting in bed  Head: Normocephalic, atraumatic. Moist oral mucosal membranes  Lungs:  Clear to auscultation  Heart: Regular rate and rhythm  Abdomen:  Soft, nontender,   Extremities:  no peripheral edema.  Neurologic: Alert, able to answer simple questions  Skin: No lesions  Foley in place  Basic Metabolic Panel: Recent Labs  Lab 05/05/20 2037 05/06/20 0843 05/07/20 0851 05/08/20 0853 05/09/20 1338  NA 134* 136 134* 135 135  K 4.5 3.7 3.9 4.3 4.2  CL 97* 99 97* 100 98  CO2 21* 25 23 18* 22  GLUCOSE 131* 115* 126* 106* 166*  BUN 65* 72* 82* 89* 92*  CREATININE 2.41* 2.73* 3.04* 3.05* 2.46*  CALCIUM 8.5* 8.7* 8.7* 8.6*  8.9  MG 2.6* 2.7* 2.9* 3.2* 3.1*    Liver Function Tests: No results for input(s): AST, ALT, ALKPHOS, BILITOT, PROT, ALBUMIN in the last 168 hours. No results for input(s): LIPASE, AMYLASE in the last 168 hours. No results for input(s): AMMONIA in the last 168 hours.  CBC: Recent Labs  Lab 05/05/20 2037 05/06/20 0728 05/07/20 0851 05/08/20 0853 05/09/20 1338  WBC 9.1 10.5 7.1 10.3 9.4  HGB 12.0* 11.4* 11.2* 12.2* 11.6*  HCT 35.2* 32.9* 33.1* 36.9* 35.1*  MCV 78.7* 77.8* 78.6* 81.1 80.1  PLT 196 221 184 149* 150    Cardiac Enzymes: No results for input(s): CKTOTAL, CKMB, CKMBINDEX, TROPONINI in the last 168 hours.  BNP: Invalid input(s): POCBNP  CBG: No results for input(s): GLUCAP in the last 168 hours.  Microbiology: Results for orders placed or performed during the hospital encounter of 04/29/20  Culture, blood (routine x 2)     Status: None   Collection Time: 04/29/20  9:34 AM   Specimen: BLOOD  Result Value Ref Range Status   Specimen Description BLOOD RIGHT ARM  Final   Special Requests   Final    BOTTLES DRAWN AEROBIC AND ANAEROBIC Blood Culture adequate volume   Culture   Final    NO GROWTH 5 DAYS Performed at [facility], [address] [place], [place] [address]    Report Status 05/04/2020 FINAL  Final  Culture, blood (routine x 2)     Status: None  Collection Time: 04/29/20  9:34 AM   Specimen: BLOOD  Result Value Ref Range Status   Specimen Description BLOOD BLOOD LEFT ARM  Final   Special Requests   Final    BOTTLES DRAWN AEROBIC AND ANAEROBIC Blood Culture adequate volume   Culture   Final    NO GROWTH 5 DAYS Performed at [facility], [address], [place], [place] [address]    Report Status 05/04/2020 FINAL  Final  SARS CORONAVIRUS 2 (TAT 6-24 HRS) Nasopharyngeal Nasopharyngeal Swab     Status: None   Collection Time: 04/29/20  9:40 AM   Specimen: Nasopharyngeal Swab  Result Value Ref Range Status   SARS  Coronavirus 2 NEGATIVE NEGATIVE Final    Comment: (NOTE) SARS-CoV-2 target nucleic acids are NOT DETECTED.  The SARS-CoV-2 RNA is generally detectable in upper and lower respiratory specimens during the acute phase of infection. Negative results do not preclude SARS-CoV-2 infection, do not rule out co-infections with other pathogens, and should not be used as the sole basis for treatment or other patient management decisions. Negative results must be combined with clinical observations, patient history, and epidemiological information. The expected result is Negative.  Fact Sheet for Patients: SugarRoll.be  Fact Sheet for Healthcare Providers: https://www.woods-mathews.com/  This test is not yet approved or cleared by the [place] FDA and  has been authorized for detection and/or diagnosis of SARS-CoV-2 by FDA under an Emergency Use Authorization (EUA). This EUA will remain  in effect (meaning this test can be used) for the duration of the COVID-19 declaration under Se ction 564(b)(1) of the Act, 21 U.S.C. section 360bbb-3(b)(1), unless the authorization is terminated or revoked sooner.  Performed at [facility], [place] [address] [place], [place] [address]     Coagulation Studies: No results for input(s): LABPROT, INR in the last 72 hours.  Urinalysis: No results for input(s): COLORURINE, LABSPEC, PHURINE, GLUCOSEU, HGBUR, BILIRUBINUR, KETONESUR, PROTEINUR, UROBILINOGEN, NITRITE, LEUKOCYTESUR in the last 72 hours.  Invalid input(s): APPERANCEUR    Imaging: No results found.   Medications:    . atorvastatin  40 mg Oral Daily  . calcium carbonate  400 mg of elemental calcium Oral TID WC  . collagenase   Topical Daily  . enoxaparin (LOVENOX) injection  30 mg Subcutaneous Q24H  . feeding supplement (NEPRO CARB STEADY)  237 mL Oral BID BM  . fludrocortisone  0.1 mg Oral Daily  . folic acid  1 mg Oral Daily  .  midodrine  10 mg Oral TID WC  . multivitamin with minerals  1 tablet Oral Daily  . polyethylene glycol  17 g Oral Daily  . predniSONE  10 mg Oral Q breakfast  . thiamine  100 mg Oral Daily   Or  . thiamine  100 mg Intravenous Daily   acetaminophen, ondansetron **OR** ondansetron (ZOFRAN) IV  Assessment/ Plan:  Mr. Jason Fitzgerald is a 70 y.o.  male with past medical history of interstitial lung disease, heart failure with reduced ejection fraction, hypertension, hyperlipidemia, and A. fib.   1. Acute Kidney Injury baseline creatinine 0.64 on March 29.  Acute kidney injury secondary to IV contrast and diuresis.  Current level 2.46 Avoid nephrotoxic medications and treatments Continue to hold diuretics Encourage oral nutrition-speech will re-evaluate dysphagia No acute need for dialysis at this time Continue Midodrine to prevent hypotension  Lab Results  Component Value Date   CREATININE 2.46 (H) 05/09/2020   CREATININE 3.05 (H) 05/08/2020   CREATININE 3.04 (  H) 05/07/2020    Intake/Output Summary (Last 24 hours) at 05/09/2020 1514 Last data filed at 05/09/2020 1400 Gross per 24 hour  Intake 120 ml  Output 850 ml  Net -730 ml   2. Acute on Chronic Heart failure with EF <20% Refused LifeVest and ran out of medications at home DNR status Lasix in ED ECHO 04/19/20-severely decreased function of left ventricle, EF <20, and global hypokinesis.Grade 2 diastolic dysfunction.   3. Shortness of breath Lasix in ED Weaned to room air   LOS: 9 Jason Fitzgerald 4/7/20223:14 PM

## 2020-05-09 NOTE — Progress Notes (Signed)
Mobility Specialist - Progress Note   05/09/20 1642  Mobility  Activity Transferred:  Chair to bed  Range of Motion/Exercises Right leg;Left leg;Passive;Active (AP, SLR, ABD, HS, [place])  Level of Assistance Moderate assist, patient does 50-74%  Assistive Device Front wheel walker  Distance Ambulated (ft) 3 ft  Mobility Response Tolerated well  Mobility performed by Mobility specialist  $Mobility charge 1 Mobility    Pre-mobility: 94 HR, 98% SpO2 During mobility: 85 HR, 96% SpO2   Pt sitting in recliner upon arrival c/o sacral pain, no c/o LUE pain at this time. "It feels hard to breathe sometimes." Pt educated and utilized PLB technique, sats in high 90s throughout session on RA. Pt agreed to participate in seated exercises: ankle pumps 2x10, straight leg raises 2x10, abduction 2x10, heel slides x5/x10, and shoulder circles x10 prior to return to bed. Engaged in PROM during second sets. Several rest breaks taken in between each exercise per extremity for endurance and breathing management. NT in to fix catheter. Pt yells out in pain as mobility lowers recliner leg rest for C-B transfer. Pt stood to RW (2 unsuccessful attempts d/t pain) with use of momentum. Max-ModA. Very short, shuffled steps to return to bed. Pt returned supine via log rolling and onto R side for pain/pressure relief on L shoulder and tailbone. Noted bleeding in wound area as pt had been refusing dressing changes this date. Lengthy discussion on importance of mobility, medication, and dressing changes. Pt then agreeable, RN notified and entered to address issue. Pillows placed under pt's L side for comfort prior to exit. Pt grateful and voices he feels better after session.    Kathee Delton Mobility Specialist 05/09/20, 5:02 PM

## 2020-05-09 NOTE — Progress Notes (Signed)
Pulmonary Medicine          Date: 05/09/2020,   MRN# [id number] Jason Fitzgerald [birth year]     AdmissionWeight: 74.8 kg                 CurrentWeight: 77.8 kg   Referring physician: Dr Billie Ruddy   CHIEF COMPLAINT:   Acute on chronic hypoxemic respiratory failure in context of pulmonary fibrosis.    HISTORY OF PRESENT ILLNESS   70 yo M w PMH of CHF , COPD, substance abuse , Pulmonary fibrosis, emphysema, chronic hypoxemic respiratory failure, chronic cough who has had recurrent hospitalization for respiratory failure. He has previously been referred to hospice due to comorbid health status however has declined comfort care measures and wishes to continue therapy. He had Transthoracic echo 04/18/20 and it showed severe CHF with EF <70%, RV systolic fucntion is also reduced, there is severe MR. He had CT chest done 12/2019 with findings of basal predominant bilateral pulmonary fibrosis with associated traction bronchiectasis and concomitant upper lobe predominant centriobular emphysema with mild to moderate pleural effusions. Pictorial documentation is included below. Pulmonary consultation placed per patients wishes to have more respiratory workup to help his chronic lung disease.    05/04/20-Patient reports improvement clinically. He feels less dyspnea and wants to try physical therapy more. He has less LE edema.  His urine is dark and renal function worse, discussed with attending physician will hold diuresis. We reviewed incentive spirometry techinique, he is very deconditioned takes volumes up to 350cc only.  He is on room air and BP is low and HR is 74 despite stopping beta blocker and placing on midodrine 10 TID. Cardiology is on case will review regarding re-initiation of beta blockade in lieu of CHF but currently I would favor holding until further guidance due to risk of hypotension.  He is on florinef and prednisone 20 mg due to adrenal insufficiency and interstitial lung disease,  this was started yesterday as salvage therapy for this patient.  We discussed smoking cessation today, he smoked Thursday and states the has not smoked in the hospital which is good.   05/05/20- Patient is sitting up in bed in no distress. He refused blood work today and I discussed this with him.  He worked with [facility] OT service and is improved. Reviewed care plan with Dr Billie Ruddy today.   05/06/2020-patient evaluated at bedside, resting in bed in no acute distress, reports breathing is better..  No acute events overnight.  Repeat blood work noted with significant worsening of his renal impairment.  CBC with differential improved with normalization of WBC count.  SPO2 100% on room air with normal vital signs today.  Ongoing PT and OT. He remains on midodrine has not had diuretics in 24h, appears dry on examination and has increased creatinine today.  I ordered renal evalaution and discussed this with Dr Billie Ruddy.   05/07/20- patient has improved respiratory status but overall his chronic lung disease is severe with combined pulmonary fibrosis and emphysema (CPFE) as well as advanced end stage systolic CHF and renal failure. He is being followed by nephrology and palliative.  His prognosis long term is poor. He is on room air now and speaks in no distress.    05/08/20- patient states he is clinically improved, he is sitting up in chair.  He has refused therapy today. He is wanting to go home. Still on room air. He is weak and deconditined.  He is working with PT/OT.  Renal funcction reduced with elevated anion gap. Have ordered lactic acid for am if he is still here.   05/09/20- patient remains severe deconditoned with malaise and component of what appears to be encephalopathy possible metabolic in etiology, he is not participating with PT.  Reviewed case with nephrology team today.  From pulmonary perspective recommend hospice at this point.   PAST MEDICAL HISTORY   Past Medical History:  Diagnosis Date  . CHF  (congestive heart failure) ([place])   . Chronic cough   . COPD (chronic obstructive pulmonary disease) ([place])   . Elevated liver function tests   . Emphysema of lung ([place])   . Fibrosis, pulmonary, interstitial, diffuse ([place])   . GERD (gastroesophageal reflux disease)   . History of cocaine abuse ([place])   . Mediastinal lymphadenopathy   . Pulmonary fibrosis ([place])   . Right inguinal hernia      SURGICAL HISTORY   Past Surgical History:  Procedure Laterality Date  . COLONOSCOPY WITH PROPOFOL N/A 08/19/2018   Procedure: COLONOSCOPY WITH PROPOFOL;  Surgeon: Virgel Manifold, MD;  Location: [facility] ENDOSCOPY;  Service: Endoscopy;  Laterality: N/A;  . HERNIA REPAIR Right 1973   open  . INGUINAL HERNIA REPAIR Right 11/08/2014   Procedure: LAPAROSCOPIC RIGHT INGUINAL HERNIA REPAIR;  Surgeon: Hubbard Robinson, MD;  Location: [facility] ORS;  Service: General;  Laterality: Right;  . knee arthroscopy Right 1972  . RIGHT/LEFT HEART CATH AND CORONARY ANGIOGRAPHY Right 03/05/2017   Procedure: RIGHT/LEFT HEART CATH AND CORONARY ANGIOGRAPHY;  Surgeon: Dionisio David, MD;  Location: [facility];  Service: Cardiovascular;  Laterality: Right;     FAMILY HISTORY   Family History  Problem Relation Age of Onset  . Diabetes Mother   . Cancer Father   . AAA (abdominal aortic aneurysm) Brother      SOCIAL HISTORY   Social History   Tobacco Use  . Smoking status: Never Smoker  . Smokeless tobacco: Never Used  Vaping Use  . Vaping Use: Never used  Substance Use Topics  . Alcohol use: Not Currently    Alcohol/week: 0.0 standard drinks    Comment: 2 quarts a week, he quit again 12/2018  . Drug use: Not Currently    Types: Cocaine    Comment: as an young adult      MEDICATIONS    Home Medication:    Current Medication:  Current Facility-Administered Medications:  .  acetaminophen (TYLENOL) tablet 1,000 mg, 1,000 mg, Oral, TID PRN, Enzo Bi, MD, 1,000 mg at 05/09/20 1121 .   atorvastatin (LIPITOR) tablet 40 mg, 40 mg, Oral, Daily, Cox, Amy N, DO, 40 mg at 05/09/20 0859 .  calcium carbonate (TUMS - dosed in mg elemental calcium) chewable tablet 400 mg of elemental calcium, 400 mg of elemental calcium, Oral, TID WC, Sharen Hones, MD, 400 mg of elemental calcium at 05/09/20 0859 .  collagenase (SANTYL) ointment, , Topical, Daily, Enzo Bi, MD, Given at 05/07/20 [phone] .  enoxaparin (LOVENOX) injection 30 mg, 30 mg, Subcutaneous, Q24H, Oswald Hillock, RPH, 30 mg at 05/09/20 0859 .  feeding supplement (NEPRO CARB STEADY) liquid 237 mL, 237 mL, Oral, BID BM, Singh, Harmeet, MD .  fludrocortisone (FLORINEF) tablet 0.1 mg, 0.1 mg, Oral, Daily, Lanney Gins, Tim Wilhide, MD, 0.1 mg at 05/09/20 0859 .  folic acid (FOLVITE) tablet 1 mg, 1 mg, Oral, Daily, Cox, Amy N, DO, 1 mg at 05/09/20 0859 .  midodrine (PROAMATINE) tablet 10 mg, 10 mg, Oral, TID WC, Cox,  Amy N, DO, 10 mg at 05/09/20 1121 .  multivitamin with minerals tablet 1 tablet, 1 tablet, Oral, Daily, Cox, Amy N, DO, 1 tablet at 05/09/20 0859 .  ondansetron (ZOFRAN) tablet 4 mg, 4 mg, Oral, Q6H PRN **OR** ondansetron (ZOFRAN) injection 4 mg, 4 mg, Intravenous, Q6H PRN, Cox, Amy N, DO, 4 mg at 05/04/20 0956 .  polyethylene glycol (MIRALAX / GLYCOLAX) packet 17 g, 17 g, Oral, Daily, Enzo Bi, MD, 17 g at 05/09/20 0858 .  predniSONE (DELTASONE) tablet 10 mg, 10 mg, Oral, Q breakfast, Romi Rathel, MD, 10 mg at 05/09/20 0859 .  thiamine tablet 100 mg, 100 mg, Oral, Daily, 100 mg at 05/09/20 0859 **OR** thiamine (B-1) injection 100 mg, 100 mg, Intravenous, Daily, Cox, Amy N, DO, 100 mg at 05/02/20 9024    ALLERGIES   Patient has no known allergies.     REVIEW OF SYSTEMS    Review of Systems:  Gen:  Denies  fever, sweats, chills weigh loss  HEENT: Denies blurred vision, double vision, ear pain, eye pain, hearing loss, nose bleeds, sore throat Cardiac:  No dizziness, chest pain or heaviness, chest tightness,edema Resp:    Denies cough or sputum porduction, shortness of breath,wheezing, hemoptysis,  Gi: Denies swallowing difficulty, stomach pain, nausea or vomiting, diarrhea, constipation, bowel incontinence Gu:  Denies bladder incontinence, burning urine Ext:   Denies Joint pain, stiffness or swelling Skin: Denies  skin rash, easy bruising or bleeding or hives Endoc:  Denies polyuria, polydipsia , polyphagia or weight change Psych:   Denies depression, insomnia or hallucinations   Other:  All other systems negative   VS: BP 105/82 (BP Location: Right Arm)   Pulse 85   Temp 97.9 F (36.6 C)   Resp 18   Ht 6' (1.829 m)   Wt 77.8 kg   SpO2 100%   BMI 23.26 kg/m      PHYSICAL EXAM    GENERAL:NAD, no fevers, chills, no weakness no fatigue HEAD: Normocephalic, atraumatic.  EYES: Pupils equal, round, reactive to light. Extraocular muscles intact. No scleral icterus.  MOUTH: Moist mucosal membrane. Dentition intact. No abscess noted.  EAR, NOSE, THROAT: Clear without exudates. No external lesions.  NECK: Supple. No thyromegaly. No nodules. No JVD.  PULMONARY: crepitations bilaterally suggestive of pulmoary fibrosis worse at bases. CARDIOVASCULAR: S1 and S2. Regular rate and rhythm. No murmurs, rubs, or gallops. No edema. Pedal pulses 2+ bilaterally.  GASTROINTESTINAL: Soft, nontender, nondistended. No masses. Positive bowel sounds. No hepatosplenomegaly.  MUSCULOSKELETAL: No swelling, clubbing, or edema. Range of motion full in all extremities.  NEUROLOGIC: Cranial nerves II through XII are intact. No gross focal neurological deficits. Sensation intact. Reflexes intact.  SKIN: No ulceration, lesions, rashes, or cyanosis. Skin warm and dry. Turgor intact.  PSYCHIATRIC: Mood, affect within normal limits. The patient is awake, alert and oriented x 3. Insight, judgment intact.       IMAGING    CT Abdomen Pelvis W Contrast  Result Date: 04/29/2020 CLINICAL DATA:  Acute nonlocalized abdominal pain  EXAM: CT ABDOMEN AND PELVIS WITH CONTRAST TECHNIQUE: Multidetector CT imaging of the abdomen and pelvis was performed using the standard protocol following bolus administration of intravenous contrast. CONTRAST:  146mL OMNIPAQUE IOHEXOL 300 MG/ML  SOLN COMPARISON:  07/20/2013 FINDINGS: Lower chest: Small bilateral pleural effusion. Fibrosis with honeycombing is seen diffusely at the lung bases. Heart size is within normal limits. Gynecomastia. Hepatobiliary: Heterogeneous density of the liver with nutmeg appearance. Subcapsular right hepatic cyst. No solid masslike  finding.Calcification at the gallbladder fossa which appears extraluminal and is chronic. No evidence of biliary inflammation or obstruction. Pancreas: Unremarkable. Spleen: Unremarkable. Adrenals/Urinary Tract: Negative adrenals. No hydronephrosis or stone. Small renal cystic densities. Unremarkable bladder. Stomach/Bowel:  No obstruction. No visible inflammation of bowel. Vascular/Lymphatic: No acute vascular abnormality. Scattered atheromatous calcification of the aorta. No mass or adenopathy. Reproductive:Partially covered right hydrocele Other: No ascites or pneumoperitoneum.  Hernia repair using mesh. Musculoskeletal: No acute abnormalities.  Lumbar spine degeneration. IMPRESSION: 1. No acute intra-abdominal finding. 2. Heterogeneous liver perfusion which could be from fibrosis or right heart failure. 3. Small pleural effusions. 4.  Aortic Atherosclerosis (ICD10-I70.0). Electronically Signed   By: Monte Fantasia M.D.   On: 04/29/2020 11:31   US RENAL  Result Date: 04/20/2020 CLINICAL DATA:  AK I EXAM: RENAL / URINARY TRACT ULTRASOUND COMPLETE COMPARISON:  None. FINDINGS: Right Kidney: Renal measurements: 11.3 x 5.3 x 6.0 cm = volume: 187 mL. Echogenicity within normal limits. No mass or hydronephrosis visualized. Left Kidney: Renal measurements: 11.7 x 5.5 x 5.8 cm = volume: 194 mL. Echogenicity within normal limits. No mass or hydronephrosis  visualized. Bladder: Decompressed with Foley catheter in place. Other: None. IMPRESSION: Unremarkable sonographic appearance of the bilateral kidneys. Electronically Signed   By: Audie Pinto M.D.   On: 04/20/2020 11:49   DG Chest Port 1 View  Result Date: 04/29/2020 CLINICAL DATA:  Shortness of breath and pain. EXAM: PORTABLE CHEST 1 VIEW COMPARISON:  04/18/2020 FINDINGS: Stable cardiomediastinal contours. Extensive fibrotic lung disease is identified within both lungs. This is most advanced within the mid and lower lung zones. No definite superimposed pulmonary opacity. There is a suggestion of subcutaneous gas within the soft tissues in the left supraclavicular region. IMPRESSION: 1. Extensive fibrotic lung disease. 2. No superimposed acute cardiopulmonary abnormality. Electronically Signed   By: Kerby Moors M.D.   On: 04/29/2020 10:43   DG Chest Port 1 View  Result Date: 04/18/2020 CLINICAL DATA:  Chest pain EXAM: PORTABLE CHEST 1 VIEW COMPARISON:  December 08, 2019 chest radiograph; chest CT December 09, 2019 FINDINGS: There is extensive fibrosis throughout the lungs, most severely in the mid and lower lung regions. There are small pleural effusions bilaterally. The heart size is within normal limits. Pulmonary vascularity is within normal limits. Lymph node prominence noted on prior CT is not well delineated radiography. No bone lesions. IMPRESSION: Extensive underlying fibrosis with small pleural effusions bilaterally. No appreciable new opacity compared to prior studies. Stable cardiac silhouette. Electronically Signed   By: Lowella Grip III M.D.   On: 04/18/2020 09:04   ECHOCARDIOGRAM COMPLETE  Result Date: 04/19/2020    ECHOCARDIOGRAM REPORT   Patient Name:   Jason Fitzgerald Date of Exam: 04/18/2020 Medical Rec #:  [id number]       Height:       69.0 in Accession #:    [id number]      Weight:       171.0 lb Date of Birth:  [birth year]      BSA:          1.933 m Patient Age:    24 years         BP:           100/68 mmHg Patient Gender: M               HR:           94 bpm. Exam Location:  [facility] Procedure: 2D Echo, Cardiac Doppler and Color  Doppler Indications:     CHF-acute systolic Z60.10  History:         Patient has prior history of Echocardiogram examinations, most                  recent 12/09/2019. CHF; COPD.  Sonographer:     Sherrie Sport RDCS (AE) Referring Phys:  [id number] [place] Diagnosing Phys: Neoma Laming MD  Sonographer Comments: Suboptimal parasternal window. IMPRESSIONS  1. Left ventricular ejection fraction, by estimation, is <20%. The left ventricle has severely decreased function. The left ventricle demonstrates global hypokinesis. The left ventricular internal cavity size was severely dilated. There is mild concentric left ventricular hypertrophy. Left ventricular diastolic parameters are consistent with Grade II diastolic dysfunction (pseudonormalization).  2. Right ventricular systolic function is mildly reduced. The right ventricular size is moderately enlarged.  3. Left atrial size was severely dilated.  4. Right atrial size was severely dilated.  5. 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 No evidence of mitral stenosis.  6. The aortic valve is grossly normal. Aortic valve regurgitation is not visualized. Mild aortic valve sclerosis is present, with no evidence of aortic valve stenosis. FINDINGS  Left Ventricle: Left ventricular ejection fraction, by estimation, is <20%. The left ventricle has severely decreased function. The left ventricle demonstrates global hypokinesis. The left ventricular internal cavity size was severely dilated. There is mild concentric left ventricular hypertrophy. Left ventricular diastolic parameters are consistent with Grade II diastolic dysfunction (pseudonormalization). Right Ventricle: The right ventricular size is moderately enlarged. No increase in right ventricular wall thickness. Right ventricular systolic  function is mildly reduced. Left Atrium: Left atrial size was severely dilated. Right Atrium: Right atrial size was severely dilated. Pericardium: There is no evidence of pericardial effusion. Mitral Valve: 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 No evidence of mitral valve stenosis. Tricuspid Valve: The tricuspid valve is grossly normal. Tricuspid valve regurgitation is mild. Aortic Valve: The aortic valve is grossly normal. Aortic valve regurgitation is not visualized. Mild aortic valve sclerosis is present, with no evidence of aortic valve stenosis. Aortic valve mean gradient measures 1.0 mmHg. Aortic valve peak gradient measures 1.7 mmHg. Aortic valve area, by VTI measures 1.85 cm. Pulmonic Valve: The pulmonic valve was normal in structure. Pulmonic valve regurgitation is trivial. Aorta: The aortic root, ascending aorta and aortic arch are all structurally normal, with no evidence of dilitation or obstruction. IAS/Shunts: The interatrial septum was not assessed.  LEFT VENTRICLE PLAX 2D LVIDd:         5.57 cm LVIDs:         5.01 cm LV PW:         0.90 cm LV IVS:        0.78 cm LVOT diam:     2.10 cm LV SV:         13 LV SV Index:   7 LVOT Area:     3.46 cm  LV Volumes (MOD) LV vol d, MOD A2C: 115.0 ml LV vol d, MOD A4C: 194.0 ml LV vol s, MOD A2C: 91.0 ml LV vol s, MOD A4C: 154.0 ml LV SV MOD A2C:     24.0 ml LV SV MOD A4C:     194.0 ml LV SV MOD BP:      27.8 ml RIGHT VENTRICLE RV S prime:     9.79 cm/s TAPSE (M-mode): 3.7 cm LEFT ATRIUM             Index  RIGHT ATRIUM           Index LA diam:        4.30 cm 2.22 cm/m  RA Area:     16.10 cm LA Vol (A2C):   84.0 ml 43.46 ml/m RA Volume:   43.50 ml  22.50 ml/m LA Vol (A4C):   56.0 ml 28.97 ml/m LA Biplane Vol: 69.5 ml 35.96 ml/m  AORTIC VALVE                   PULMONIC VALVE AV Area (Vmax):    1.41 cm    PV Vmax:        0.18 m/s AV Area (Vmean):   1.72 cm    PV Peak grad:   0.1 mmHg AV Area (VTI):     1.85 cm    RVOT Peak  grad: 1 mmHg AV Vmax:           66.00 cm/s AV Vmean:          42.200 cm/s AV VTI:            0.072 m AV Peak Grad:      1.7 mmHg AV Mean Grad:      1.0 mmHg LVOT Vmax:         26.80 cm/s LVOT Vmean:        21.000 cm/s LVOT VTI:          0.039 m LVOT/AV VTI ratio: 0.54  AORTA Ao Root diam: 2.83 cm MITRAL VALVE                TRICUSPID VALVE MV Area (PHT): 5.16 cm     TR Peak grad:   32.7 mmHg MV Decel Time: 147 msec     TR Vmax:        286.00 cm/s MV E velocity: 118.00 cm/s                             SHUNTS                             Systemic VTI:  0.04 m                             Systemic Diam: 2.10 cm Neoma Laming MD Electronically signed by Neoma Laming MD Signature Date/Time: 04/19/2020/8:09:07 AM    Final       ASSESSMENT/PLAN    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  Will dc cefepime. continue zithromax only 250 PO.     - prednisone 10 mg po daily and florinef .1 tid      Acute on chronic systolic CHF   - he has 1+ LE edema but he has effusions bilaterally  -S/P Renal evaluation - appreciate input - holding diuresis, dc nephrotoxins no HD for now   CKD stage 4  -renal consult - appreciate input  - palliative on case appreciate input    Severe protein calorie malnutrition  bitemporal wasting and peripheral muscle wasting.  -Nutrition and PT/OT   Thank you for allowing me to participate in the care of this patient.   Patient/Family are satisfied with care plan and all questions have been answered.  This  document was prepared using Systems analyst and may include unintentional dictation errors.     Ottie Glazier, M.D.  Division of [place]

## 2020-05-10 DIAGNOSIS — R0602 Shortness of breath: Secondary | ICD-10-CM | POA: Diagnosis not present

## 2020-05-10 DIAGNOSIS — Z7189 Other specified counseling: Secondary | ICD-10-CM | POA: Diagnosis not present

## 2020-05-10 LAB — BASIC METABOLIC PANEL
Anion gap: 16 — ABNORMAL HIGH (ref 5–15)
BUN: 93 mg/dL — ABNORMAL HIGH (ref 8–23)
CO2: 21 mmol/L — ABNORMAL LOW (ref 22–32)
Calcium: 8.9 mg/dL (ref 8.9–10.3)
Chloride: 99 mmol/L (ref 98–111)
Creatinine, Ser: 2.39 mg/dL — ABNORMAL HIGH (ref 0.61–1.24)
GFR, Estimated: 29 mL/min — ABNORMAL LOW (ref >60)
Glucose, Bld: 105 mg/dL — ABNORMAL HIGH (ref 70–99)
Potassium: 4.2 mmol/L (ref 3.5–5.1)
Sodium: 136 mmol/L (ref 135–145)

## 2020-05-10 LAB — CBC
HCT: 38.1 % — ABNORMAL LOW (ref 39.0–52.0)
Hemoglobin: 12.2 g/dL — ABNORMAL LOW (ref 13.0–17.0)
MCH: 26.9 pg (ref 26.0–34.0)
MCHC: 32 g/dL (ref 30.0–36.0)
MCV: 83.9 fL (ref 80.0–100.0)
Platelets: 131 10*3/uL — ABNORMAL LOW (ref 150–400)
RBC: 4.54 MIL/uL (ref 4.22–5.81)
RDW: 17.5 % — ABNORMAL HIGH (ref 11.5–15.5)
WBC: 9.4 10*3/uL (ref 4.0–10.5)
nRBC: 1 % — ABNORMAL HIGH (ref 0.0–0.2)

## 2020-05-10 LAB — MAGNESIUM: Magnesium: 3.1 mg/dL — ABNORMAL HIGH (ref 1.7–2.4)

## 2020-05-10 LAB — AMMONIA: Ammonia: 9 umol/L (ref 9–35)

## 2020-05-10 MED ORDER — SCOPOLAMINE 1 MG/3DAYS TD PT72
1.0000 | MEDICATED_PATCH | TRANSDERMAL | Status: DC
  Administered 2020-05-10 – 2020-05-13 (×2): 1.5 mg via TRANSDERMAL
  Filled 2020-05-10 (×2): qty 1

## 2020-05-10 MED ORDER — ENOXAPARIN SODIUM 40 MG/0.4ML ~~LOC~~ SOLN
40.0000 mg | SUBCUTANEOUS | Status: DC
  Administered 2020-05-11 – 2020-05-13 (×3): 40 mg via SUBCUTANEOUS
  Filled 2020-05-10 (×3): qty 0.4

## 2020-05-10 NOTE — Progress Notes (Signed)
Pulmonary Medicine          Date: 05/10/2020,   MRN# [id number] Jason Fitzgerald 22-May-1950     AdmissionWeight: 74.8 kg                 CurrentWeight: 77.3 kg   Referring physician: Dr Billie Ruddy   CHIEF COMPLAINT:   Acute on chronic hypoxemic respiratory failure in context of pulmonary fibrosis.    HISTORY OF PRESENT ILLNESS   70 yo M w PMH of CHF , COPD, substance abuse , Pulmonary fibrosis, emphysema, chronic hypoxemic respiratory failure, chronic cough who has had recurrent hospitalization for respiratory failure. He has previously been referred to hospice due to comorbid health status however has declined comfort care measures and wishes to continue therapy. He had Transthoracic echo 04/18/20 and it showed severe CHF with EF <06%, RV systolic fucntion is also reduced, there is severe MR. He had CT chest done 12/2019 with findings of basal predominant bilateral pulmonary fibrosis with associated traction bronchiectasis and concomitant upper lobe predominant centriobular emphysema with mild to moderate pleural effusions. Pictorial documentation is included below. Pulmonary consultation placed per patients wishes to have more respiratory workup to help his chronic lung disease.    05/04/20-Patient reports improvement clinically. He feels less dyspnea and wants to try physical therapy more. He has less LE edema.  His urine is dark and renal function worse, discussed with attending physician will hold diuresis. We reviewed incentive spirometry techinique, he is very deconditioned takes volumes up to 350cc only.  He is on room air and BP is low and HR is 74 despite stopping beta blocker and placing on midodrine 10 TID. Cardiology is on case will review regarding re-initiation of beta blockade in lieu of CHF but currently I would favor holding until further guidance due to risk of hypotension.  He is on florinef and prednisone 20 mg due to adrenal insufficiency and interstitial lung disease,  this was started yesterday as salvage therapy for this patient.  We discussed smoking cessation today, he smoked Thursday and states the has not smoked in the hospital which is good.   05/05/20- Patient is sitting up in bed in no distress. He refused blood work today and I discussed this with him.  He worked with [facility] OT service and is improved. Reviewed care plan with Dr Billie Ruddy today.   05/06/2020-patient evaluated at bedside, resting in bed in no acute distress, reports breathing is better..  No acute events overnight.  Repeat blood work noted with significant worsening of his renal impairment.  CBC with differential improved with normalization of WBC count.  SPO2 100% on room air with normal vital signs today.  Ongoing PT and OT. He remains on midodrine has not had diuretics in 24h, appears dry on examination and has increased creatinine today.  I ordered renal evalaution and discussed this with Dr Billie Ruddy.   05/07/20- patient has improved respiratory status but overall his chronic lung disease is severe with combined pulmonary fibrosis and emphysema (CPFE) as well as advanced end stage systolic CHF and renal failure. He is being followed by nephrology and palliative.  His prognosis long term is poor. He is on room air now and speaks in no distress.    05/08/20- patient states he is clinically improved, he is sitting up in chair.  He has refused therapy today. He is wanting to go home. Still on room air. He is weak and deconditined.  He is working with PT/OT.  Renal funcction reduced with elevated anion gap. Have ordered lactic acid for am if he is still here.   05/09/20- patient remains severe deconditoned with malaise and component of what appears to be encephalopathy possible metabolic in etiology, he is not participating with PT.  Reviewed case with nephrology team today.  From pulmonary perspective recommend hospice at this point.  05/10/20- patient is unchanged, he is resting in bed in no distress.  He does show  signs of dementia or confusion/encephalopathy.  He has end stage multi organ failure, not much more to offer from pulmonary perspective.   PAST MEDICAL HISTORY   Past Medical History:  Diagnosis Date  . CHF (congestive heart failure) ([place])   . Chronic cough   . COPD (chronic obstructive pulmonary disease) ([place])   . Elevated liver function tests   . Emphysema of lung ([place])   . Fibrosis, pulmonary, interstitial, diffuse ([place])   . GERD (gastroesophageal reflux disease)   . History of cocaine abuse ([place])   . Mediastinal lymphadenopathy   . Pulmonary fibrosis ([place])   . Right inguinal hernia      SURGICAL HISTORY   Past Surgical History:  Procedure Laterality Date  . COLONOSCOPY WITH PROPOFOL N/A 08/19/2018   Procedure: COLONOSCOPY WITH PROPOFOL;  Surgeon: Virgel Manifold, MD;  Location: [facility] ENDOSCOPY;  Service: Endoscopy;  Laterality: N/A;  . HERNIA REPAIR Right 1973   open  . INGUINAL HERNIA REPAIR Right 11/08/2014   Procedure: LAPAROSCOPIC RIGHT INGUINAL HERNIA REPAIR;  Surgeon: Hubbard Robinson, MD;  Location: [facility] ORS;  Service: General;  Laterality: Right;  . knee arthroscopy Right 1972  . RIGHT/LEFT HEART CATH AND CORONARY ANGIOGRAPHY Right 03/05/2017   Procedure: RIGHT/LEFT HEART CATH AND CORONARY ANGIOGRAPHY;  Surgeon: Dionisio David, MD;  Location: [facility];  Service: Cardiovascular;  Laterality: Right;     FAMILY HISTORY   Family History  Problem Relation Age of Onset  . Diabetes Mother   . Cancer Father   . AAA (abdominal aortic aneurysm) Brother      SOCIAL HISTORY   Social History   Tobacco Use  . Smoking status: Never Smoker  . Smokeless tobacco: Never Used  Vaping Use  . Vaping Use: Never used  Substance Use Topics  . Alcohol use: Not Currently    Alcohol/week: 0.0 standard drinks    Comment: 2 quarts a week, he quit again 12/2018  . Drug use: Not Currently    Types: Cocaine    Comment: as an young adult      MEDICATIONS     Home Medication:    Current Medication:  Current Facility-Administered Medications:  .  acetaminophen (TYLENOL) tablet 1,000 mg, 1,000 mg, Oral, TID PRN, Enzo Bi, MD, 1,000 mg at 05/09/20 1121 .  atorvastatin (LIPITOR) tablet 40 mg, 40 mg, Oral, Daily, Cox, Amy N, DO, 40 mg at 05/09/20 0859 .  calcium carbonate (TUMS - dosed in mg elemental calcium) chewable tablet 400 mg of elemental calcium, 400 mg of elemental calcium, Oral, TID WC, Sharen Hones, MD, 400 mg of elemental calcium at 05/09/20 0859 .  collagenase (SANTYL) ointment, , Topical, Daily, Enzo Bi, MD, Given at 05/09/20 1554 .  enoxaparin (LOVENOX) injection 30 mg, 30 mg, Subcutaneous, Q24H, Oswald Hillock, RPH, 30 mg at 05/09/20 0859 .  feeding supplement (NEPRO CARB STEADY) liquid 237 mL, 237 mL, Oral, BID BM, Singh, Harmeet, MD .  fludrocortisone (FLORINEF) tablet 0.1 mg, 0.1 mg, Oral, Daily, Ottie Glazier, MD, 0.1  mg at 05/09/20 0859 .  folic acid (FOLVITE) tablet 1 mg, 1 mg, Oral, Daily, Cox, Amy N, DO, 1 mg at 05/09/20 0859 .  midodrine (PROAMATINE) tablet 10 mg, 10 mg, Oral, TID WC, Cox, Amy N, DO, 10 mg at 05/09/20 1730 .  multivitamin with minerals tablet 1 tablet, 1 tablet, Oral, Daily, Cox, Amy N, DO, 1 tablet at 05/09/20 0859 .  ondansetron (ZOFRAN) tablet 4 mg, 4 mg, Oral, Q6H PRN **OR** ondansetron (ZOFRAN) injection 4 mg, 4 mg, Intravenous, Q6H PRN, Cox, Amy N, DO, 4 mg at 05/04/20 0956 .  polyethylene glycol (MIRALAX / GLYCOLAX) packet 17 g, 17 g, Oral, Daily, Enzo Bi, MD, 17 g at 05/09/20 0858 .  predniSONE (DELTASONE) tablet 10 mg, 10 mg, Oral, Q breakfast, Faye Sanfilippo, MD, 10 mg at 05/09/20 0859 .  thiamine tablet 100 mg, 100 mg, Oral, Daily, 100 mg at 05/09/20 0859 **OR** thiamine (B-1) injection 100 mg, 100 mg, Intravenous, Daily, Cox, Amy N, DO, 100 mg at 05/02/20 2620    ALLERGIES   Patient has no known allergies.     REVIEW OF SYSTEMS    Review of Systems:  Gen:  Denies  fever,  sweats, chills weigh loss  HEENT: Denies blurred vision, double vision, ear pain, eye pain, hearing loss, nose bleeds, sore throat Cardiac:  No dizziness, chest pain or heaviness, chest tightness,edema Resp:   Denies cough or sputum porduction, shortness of breath,wheezing, hemoptysis,  Gi: Denies swallowing difficulty, stomach pain, nausea or vomiting, diarrhea, constipation, bowel incontinence Gu:  Denies bladder incontinence, burning urine Ext:   Denies Joint pain, stiffness or swelling Skin: Denies  skin rash, easy bruising or bleeding or hives Endoc:  Denies polyuria, polydipsia , polyphagia or weight change Psych:   Denies depression, insomnia or hallucinations   Other:  All other systems negative   VS: BP 105/75 (BP Location: Right Arm)   Pulse 78   Temp 98 F (36.7 C) (Oral)   Resp 17   Ht 6' (1.829 m)   Wt 77.3 kg   SpO2 98%   BMI 23.11 kg/m      PHYSICAL EXAM    GENERAL:NAD, no fevers, chills, no weakness no fatigue HEAD: Normocephalic, atraumatic.  EYES: Pupils equal, round, reactive to light. Extraocular muscles intact. No scleral icterus.  MOUTH: Moist mucosal membrane. Dentition intact. No abscess noted.  EAR, NOSE, THROAT: Clear without exudates. No external lesions.  NECK: Supple. No thyromegaly. No nodules. No JVD.  PULMONARY: crepitations bilaterally suggestive of pulmoary fibrosis worse at bases. CARDIOVASCULAR: S1 and S2. Regular rate and rhythm. No murmurs, rubs, or gallops. No edema. Pedal pulses 2+ bilaterally.  GASTROINTESTINAL: Soft, nontender, nondistended. No masses. Positive bowel sounds. No hepatosplenomegaly.  MUSCULOSKELETAL: No swelling, clubbing, or edema. Range of motion full in all extremities.  NEUROLOGIC: Cranial nerves II through XII are intact. No gross focal neurological deficits. Sensation intact. Reflexes intact.  SKIN: No ulceration, lesions, rashes, or cyanosis. Skin warm and dry. Turgor intact.  PSYCHIATRIC: Mood, affect within  normal limits. The patient is awake, alert and oriented x 3. Insight, judgment intact.       IMAGING    CT Abdomen Pelvis W Contrast  Result Date: 04/29/2020 CLINICAL DATA:  Acute nonlocalized abdominal pain EXAM: CT ABDOMEN AND PELVIS WITH CONTRAST TECHNIQUE: Multidetector CT imaging of the abdomen and pelvis was performed using the standard protocol following bolus administration of intravenous contrast. CONTRAST:  128mL OMNIPAQUE IOHEXOL 300 MG/ML  SOLN COMPARISON:  07/20/2013 FINDINGS:  Lower chest: Small bilateral pleural effusion. Fibrosis with honeycombing is seen diffusely at the lung bases. Heart size is within normal limits. Gynecomastia. Hepatobiliary: Heterogeneous density of the liver with nutmeg appearance. Subcapsular right hepatic cyst. No solid masslike finding.Calcification at the gallbladder fossa which appears extraluminal and is chronic. No evidence of biliary inflammation or obstruction. Pancreas: Unremarkable. Spleen: Unremarkable. Adrenals/Urinary Tract: Negative adrenals. No hydronephrosis or stone. Small renal cystic densities. Unremarkable bladder. Stomach/Bowel:  No obstruction. No visible inflammation of bowel. Vascular/Lymphatic: No acute vascular abnormality. Scattered atheromatous calcification of the aorta. No mass or adenopathy. Reproductive:Partially covered right hydrocele Other: No ascites or pneumoperitoneum.  Hernia repair using mesh. Musculoskeletal: No acute abnormalities.  Lumbar spine degeneration. IMPRESSION: 1. No acute intra-abdominal finding. 2. Heterogeneous liver perfusion which could be from fibrosis or right heart failure. 3. Small pleural effusions. 4.  Aortic Atherosclerosis (ICD10-I70.0). Electronically Signed   By: Monte Fantasia M.D.   On: 04/29/2020 11:31   US RENAL  Result Date: 04/20/2020 CLINICAL DATA:  AK I EXAM: RENAL / URINARY TRACT ULTRASOUND COMPLETE COMPARISON:  None. FINDINGS: Right Kidney: Renal measurements: 11.3 x 5.3 x 6.0 cm =  volume: 187 mL. Echogenicity within normal limits. No mass or hydronephrosis visualized. Left Kidney: Renal measurements: 11.7 x 5.5 x 5.8 cm = volume: 194 mL. Echogenicity within normal limits. No mass or hydronephrosis visualized. Bladder: Decompressed with Foley catheter in place. Other: None. IMPRESSION: Unremarkable sonographic appearance of the bilateral kidneys. Electronically Signed   By: Audie Pinto M.D.   On: 04/20/2020 11:49   DG Chest Port 1 View  Result Date: 04/29/2020 CLINICAL DATA:  Shortness of breath and pain. EXAM: PORTABLE CHEST 1 VIEW COMPARISON:  04/18/2020 FINDINGS: Stable cardiomediastinal contours. Extensive fibrotic lung disease is identified within both lungs. This is most advanced within the mid and lower lung zones. No definite superimposed pulmonary opacity. There is a suggestion of subcutaneous gas within the soft tissues in the left supraclavicular region. IMPRESSION: 1. Extensive fibrotic lung disease. 2. No superimposed acute cardiopulmonary abnormality. Electronically Signed   By: Kerby Moors M.D.   On: 04/29/2020 10:43   DG Chest Port 1 View  Result Date: 04/18/2020 CLINICAL DATA:  Chest pain EXAM: PORTABLE CHEST 1 VIEW COMPARISON:  December 08, 2019 chest radiograph; chest CT December 09, 2019 FINDINGS: There is extensive fibrosis throughout the lungs, most severely in the mid and lower lung regions. There are small pleural effusions bilaterally. The heart size is within normal limits. Pulmonary vascularity is within normal limits. Lymph node prominence noted on prior CT is not well delineated radiography. No bone lesions. IMPRESSION: Extensive underlying fibrosis with small pleural effusions bilaterally. No appreciable new opacity compared to prior studies. Stable cardiac silhouette. Electronically Signed   By: Lowella Grip III M.D.   On: 04/18/2020 09:04   ECHOCARDIOGRAM COMPLETE  Result Date: 04/19/2020    ECHOCARDIOGRAM REPORT   Patient Name:   Jason Fitzgerald Date of Exam: 04/18/2020 Medical Rec #:  [id number]       Height:       69.0 in Accession #:    [id number]      Weight:       171.0 lb Date of Birth:  03-May-1950      BSA:          1.933 m Patient Age:    14 years        BP:           100/68 mmHg Patient Gender:  M               HR:           94 bpm. Exam Location:  [facility] Procedure: 2D Echo, Cardiac Doppler and Color Doppler Indications:     CHF-acute systolic R44.31  History:         Patient has prior history of Echocardiogram examinations, most                  recent 12/09/2019. CHF; COPD.  Sonographer:     Sherrie Sport RDCS (AE) Referring Phys:  [id number] [place] Diagnosing Phys: Neoma Laming MD  Sonographer Comments: Suboptimal parasternal window. IMPRESSIONS  1. Left ventricular ejection fraction, by estimation, is <20%. The left ventricle has severely decreased function. The left ventricle demonstrates global hypokinesis. The left ventricular internal cavity size was severely dilated. There is mild concentric left ventricular hypertrophy. Left ventricular diastolic parameters are consistent with Grade II diastolic dysfunction (pseudonormalization).  2. Right ventricular systolic function is mildly reduced. The right ventricular size is moderately enlarged.  3. Left atrial size was severely dilated.  4. Right atrial size was severely dilated.  5. 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 No evidence of mitral stenosis.  6. The aortic valve is grossly normal. Aortic valve regurgitation is not visualized. Mild aortic valve sclerosis is present, with no evidence of aortic valve stenosis. FINDINGS  Left Ventricle: Left ventricular ejection fraction, by estimation, is <20%. The left ventricle has severely decreased function. The left ventricle demonstrates global hypokinesis. The left ventricular internal cavity size was severely dilated. There is mild concentric left ventricular hypertrophy. Left ventricular diastolic parameters  are consistent with Grade II diastolic dysfunction (pseudonormalization). Right Ventricle: The right ventricular size is moderately enlarged. No increase in right ventricular wall thickness. Right ventricular systolic function is mildly reduced. Left Atrium: Left atrial size was severely dilated. Right Atrium: Right atrial size was severely dilated. Pericardium: There is no evidence of pericardial effusion. Mitral Valve: 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 No evidence of mitral valve stenosis. Tricuspid Valve: The tricuspid valve is grossly normal. Tricuspid valve regurgitation is mild. Aortic Valve: The aortic valve is grossly normal. Aortic valve regurgitation is not visualized. Mild aortic valve sclerosis is present, with no evidence of aortic valve stenosis. Aortic valve mean gradient measures 1.0 mmHg. Aortic valve peak gradient measures 1.7 mmHg. Aortic valve area, by VTI measures 1.85 cm. Pulmonic Valve: The pulmonic valve was normal in structure. Pulmonic valve regurgitation is trivial. Aorta: The aortic root, ascending aorta and aortic arch are all structurally normal, with no evidence of dilitation or obstruction. IAS/Shunts: The interatrial septum was not assessed.  LEFT VENTRICLE PLAX 2D LVIDd:         5.57 cm LVIDs:         5.01 cm LV PW:         0.90 cm LV IVS:        0.78 cm LVOT diam:     2.10 cm LV SV:         13 LV SV Index:   7 LVOT Area:     3.46 cm  LV Volumes (MOD) LV vol d, MOD A2C: 115.0 ml LV vol d, MOD A4C: 194.0 ml LV vol s, MOD A2C: 91.0 ml LV vol s, MOD A4C: 154.0 ml LV SV MOD A2C:     24.0 ml LV SV MOD A4C:     194.0 ml LV SV  MOD BP:      27.8 ml RIGHT VENTRICLE RV S prime:     9.79 cm/s TAPSE (M-mode): 3.7 cm LEFT ATRIUM             Index       RIGHT ATRIUM           Index LA diam:        4.30 cm 2.22 cm/m  RA Area:     16.10 cm LA Vol (A2C):   84.0 ml 43.46 ml/m RA Volume:   43.50 ml  22.50 ml/m LA Vol (A4C):   56.0 ml 28.97 ml/m LA Biplane Vol:  69.5 ml 35.96 ml/m  AORTIC VALVE                   PULMONIC VALVE AV Area (Vmax):    1.41 cm    PV Vmax:        0.18 m/s AV Area (Vmean):   1.72 cm    PV Peak grad:   0.1 mmHg AV Area (VTI):     1.85 cm    RVOT Peak grad: 1 mmHg AV Vmax:           66.00 cm/s AV Vmean:          42.200 cm/s AV VTI:            0.072 m AV Peak Grad:      1.7 mmHg AV Mean Grad:      1.0 mmHg LVOT Vmax:         26.80 cm/s LVOT Vmean:        21.000 cm/s LVOT VTI:          0.039 m LVOT/AV VTI ratio: 0.54  AORTA Ao Root diam: 2.83 cm MITRAL VALVE                TRICUSPID VALVE MV Area (PHT): 5.16 cm     TR Peak grad:   32.7 mmHg MV Decel Time: 147 msec     TR Vmax:        286.00 cm/s MV E velocity: 118.00 cm/s                             SHUNTS                             Systemic VTI:  0.04 m                             Systemic Diam: 2.10 cm Neoma Laming MD Electronically signed by Neoma Laming MD Signature Date/Time: 04/19/2020/8:09:07 AM    Final       ASSESSMENT/PLAN    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  Will dc cefepime. continue zithromax only 250 PO.     - prednisone 10 mg po daily and florinef .1 tid      Acute on chronic systolic CHF   - he has 1+ LE edema but he has effusions bilaterally  -S/P Renal evaluation - appreciate input - holding diuresis, dc nephrotoxins no HD for now   CKD stage 4  -renal consult - appreciate input  - palliative on case appreciate input    Severe  protein calorie malnutrition  bitemporal wasting and peripheral muscle wasting.  -Nutrition and PT/OT   Thank you for allowing me to participate in the care of this patient.   Patient/Family are satisfied with care plan and all questions have been answered.  This document was prepared using Dragon voice recognition software and may include unintentional dictation errors.      Ottie Glazier, M.D.  Division of [facility]

## 2020-05-10 NOTE — Progress Notes (Signed)
Daily Progress Note   Patient Name: Jason Fitzgerald       Date: 05/10/2020 DOB: 10-Oct-1950  Age: 70 y.o. MRN#: [id number] Attending Physician: Dessa Phi, DO Primary Care Physician: Steele Sizer, MD Admit Date: 04/29/2020  Reason for Consultation/Follow-up: Establishing goals of care  Subjective: Patient is calmly resting in bed. He is alert and oriented x 3. He states he is trying to do the best he can, and wants to try to continue care to live longer. He understands his diagnosis and prognosis. He states he is amenable to SNF.  Would recommend outpatient palliative for GOC over time. Current goals of full code/full scope, please call with further needs.   Length of Stay: 10  Current Medications: Scheduled Meds:  . atorvastatin  40 mg Oral Daily  . calcium carbonate  400 mg of elemental calcium Oral TID WC  . collagenase   Topical Daily  . enoxaparin (LOVENOX) injection  30 mg Subcutaneous Q24H  . feeding supplement (NEPRO CARB STEADY)  237 mL Oral BID BM  . fludrocortisone  0.1 mg Oral Daily  . folic acid  1 mg Oral Daily  . midodrine  10 mg Oral TID WC  . multivitamin with minerals  1 tablet Oral Daily  . polyethylene glycol  17 g Oral Daily  . predniSONE  10 mg Oral Q breakfast  . thiamine  100 mg Oral Daily   Or  . thiamine  100 mg Intravenous Daily    Continuous Infusions:   PRN Meds: acetaminophen, ondansetron **OR** ondansetron (ZOFRAN) IV  Physical Exam Pulmonary:     Effort: Pulmonary effort is normal.  Neurological:     Mental Status: He is alert.             Vital Signs: BP 95/83 (BP Location: Left Arm)   Pulse 91   Temp (!) 97.5 F (36.4 C) (Oral)   Resp 18   Ht 6' (1.829 m)   Wt 77.3 kg   SpO2 99%   BMI 23.11 kg/m  SpO2: SpO2: 99 % O2  Device: O2 Device: Room Air O2 Flow Rate:    Intake/output summary:   Intake/Output Summary (Last 24 hours) at 05/10/2020 1243 Last data filed at 05/09/2020 1400 Gross per 24 hour  Intake --  Output 450 ml  Net -450 ml  LBM: Last BM Date: (P) 05/03/20 Baseline Weight: Weight: 74.8 kg Most recent weight: Weight: 77.3 kg         Patient Active Problem List   Diagnosis Date Noted  . Acute on chronic systolic (congestive) heart failure ([place]) 04/30/2020  . Sepsis ([place]) 04/30/2020  . Aspiration pneumonia of both lower lobes due to gastric secretions ([place]) 04/30/2020  . Shortness of breath 04/29/2020  . Prolonged QT interval 04/29/2020  . DNR (do not resuscitate) 04/22/2020  . Atrial fibrillation with rapid ventricular response ([place]) 04/18/2020  . Non compliance w medication regimen   . Atrial fibrillation ([place]) 12/09/2019  . Atrial fibrillation with RVR ([place]) 12/08/2019  . Slurred speech 12/08/2019  . Chest pain 12/08/2019  . Elevated troponin 12/08/2019  . Acute CHF (congestive heart failure) ([place]) 08/05/2019  . Diarrhea   . Dizziness 12/17/2018  . Hypotension 12/17/2018  . Hypophosphatemia 12/17/2018  . Hypomagnesemia 12/17/2018  . Positive D-dimer 12/17/2018  . Abnormal LFTs (liver function tests) 12/17/2018  . Anemia 12/17/2018  . Fibrosis, pulmonary, interstitial, diffuse ([place])   . GERD (gastroesophageal reflux disease)   . COPD (chronic obstructive pulmonary disease) ([place])   . CHF (congestive heart failure) ([place])   . Encounter for screening colonoscopy   . Polyp of transverse colon   . Internal hemorrhoids   . Hypokalemia 02/03/2018  . History of alcoholism ([place]) 12/08/2017  . Rheumatoid arthritis involving multiple sites with positive rheumatoid factor ([place]) 10/13/2017  . Chronic systolic CHF (congestive heart failure) ([place]) 03/12/2017  . AF (paroxysmal atrial fibrillation) ([place]) 03/12/2017  . Arthritis of left knee 08/01/2016  . Degenerative arthritis of lumbar  spine 07/02/2016  . Chronic cough 05/29/2015  . CAFL (chronic airflow limitation) ([place]) 10/24/2014  . Arteriosclerosis of coronary artery 10/24/2014  . History of fundoplication 38/18/2993  . HTN (hypertension) 10/24/2014  . LAD (lymphadenopathy), mediastinal 07/13/2013  . Interstitial lung disease ([place]) 06/19/2013  . 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     Code Status:    Code Status Orders  (From admission, onward)         Start     Ordered   05/01/20 1344  Full code  Continuous        05/01/20 1343        Code Status History    Date Active Date Inactive Code Status Order ID Comments User Context   04/29/2020 1256 05/01/2020 1343 DNR [id number]  Criss Alvine, DO ED   04/22/2020 0956 04/22/2020 1825 DNR [id number]  Debbe Odea, MD Inpatient   04/18/2020 1008 04/22/2020 0956 Full Code [id number]  Collier Bullock, MD ED   12/08/2019 1430 12/14/2019 0120 Full Code [id number]  Ivor Costa, MD ED   08/05/2019 0423 08/07/2019 2106 Full Code [id number]  Mansy, Arvella Merles, MD ED   12/17/2018 1453 12/20/2018 1529 Full Code [id number]  Para Skeans, MD ED   02/03/2018 1520 02/05/2018 1811 Full Code [id number]  Saundra Shelling, MD ED   03/04/2017 1231 03/06/2017 1707 Full Code [id number]  Demetrios Loll, MD ED   10/26/2014 1128 10/26/2014 1500 Full Code [id number]  Hubbard Robinson, MD Outpatient   Advance Care Planning Activity      Prognosis:  < 6 months   Thank you for allowing the Palliative Medicine Team to assist in the care of this patient.   Total Time 15 min Prolonged Time Billed  no  Greater than 50%  of this time was spent counseling and coordinating care related to the above assessment and plan.  Martise Waddell, NP  Please contact Palliative Medicine Team phone at [phone] for questions and concerns.      

## 2020-05-10 NOTE — Progress Notes (Addendum)
[facility]  ROUNDING NOTE   Subjective:   Jason Fitzgerald is a 70 year old male with past medical history of interstitial lung disease, heart failure with reduced ejection fraction, hypertension, hyperlipidemia, and A. fib.  He presents to the emergency room with complaints of shortness of breath.  He is admitted for possible volume overload related to heart failure.  We have been consulted due to worsening renal function.  In the setting of diuresing and IV contrast, patient's creatinine has increased.  Patient seen resting in bed States he ate half of breakfast Says he worked with PT and walked to room door and sat in chair Denies nausea and shortness of breath   Objective:  Vital signs in last 24 hours:  Temp:  [97.4 F (36.3 C)-98 F (36.7 C)] 97.4 F (36.3 C) (04/08 0839) Pulse Rate:  [71-87] 71 (04/08 0839) Resp:  [17-20] 18 (04/08 0839) BP: (100-112)/(71-96) 100/71 (04/08 0839) SpO2:  [94 %-100 %] 100 % (04/08 0839) Weight:  [77.3 kg] 77.3 kg (04/08 0427)  Weight change: -0.5 kg Filed Weights   05/08/20 0408 05/09/20 0546 05/10/20 0427  Weight: 78.5 kg 77.8 kg 77.3 kg    Intake/Output: I/O last 3 completed shifts: In: 120 [P.O.:120] Out: 450 [Urine:450]   Intake/Output this shift:  No intake/output data recorded.  Physical Exam: General: NAD, resting in bed  Head: Normocephalic, atraumatic. Moist oral mucosal membranes  Lungs:  Clear to auscultation  Heart: Regular rate and rhythm  Abdomen:  Soft, nontender,   Extremities:  trace peripheral edema.  Neurologic: Alert, able to answer simple questions  Skin: No lesions  Foley in place  Basic Metabolic Panel: Recent Labs  Lab 05/06/20 0843 05/07/20 0851 05/08/20 0853 05/09/20 1338 05/10/20 0501  NA 136 134* 135 135 136  K 3.7 3.9 4.3 4.2 4.2  CL 99 97* 100 98 99  CO2 25 23 18* 22 21*  GLUCOSE 115* 126* 106* 166* 105*  BUN 72* 82* 89* 92* 93*  CREATININE 2.73* 3.04* 3.05* 2.46* 2.39*   CALCIUM 8.7* 8.7* 8.6* 8.9 8.9  MG 2.7* 2.9* 3.2* 3.1* 3.1*    Liver Function Tests: No results for input(s): AST, ALT, ALKPHOS, BILITOT, PROT, ALBUMIN in the last 168 hours. No results for input(s): LIPASE, AMYLASE in the last 168 hours. No results for input(s): AMMONIA in the last 168 hours.  CBC: Recent Labs  Lab 05/06/20 0728 05/07/20 0851 05/08/20 0853 05/09/20 1338 05/10/20 0501  WBC 10.5 7.1 10.3 9.4 9.4  HGB 11.4* 11.2* 12.2* 11.6* 12.2*  HCT 32.9* 33.1* 36.9* 35.1* 38.1*  MCV 77.8* 78.6* 81.1 80.1 83.9  PLT 221 184 149* 150 131*    Cardiac Enzymes: No results for input(s): CKTOTAL, CKMB, CKMBINDEX, TROPONINI in the last 168 hours.  BNP: Invalid input(s): POCBNP  CBG: No results for input(s): GLUCAP in the last 168 hours.  Microbiology: Results for orders placed or performed during the hospital encounter of 04/29/20  Culture, blood (routine x 2)     Status: None   Collection Time: 04/29/20  9:34 AM   Specimen: BLOOD  Result Value Ref Range Status   Specimen Description BLOOD RIGHT ARM  Final   Special Requests   Final    BOTTLES DRAWN AEROBIC AND ANAEROBIC Blood Culture adequate volume   Culture   Final    NO GROWTH 5 DAYS Performed at [facility], [address], [place], [place] [address]    Report Status 05/04/2020 FINAL  Final  Culture, blood (  routine x 2)     Status: None   Collection Time: 04/29/20  9:34 AM   Specimen: BLOOD  Result Value Ref Range Status   Specimen Description BLOOD BLOOD LEFT ARM  Final   Special Requests   Final    BOTTLES DRAWN AEROBIC AND ANAEROBIC Blood Culture adequate volume   Culture   Final    NO GROWTH 5 DAYS Performed at [facility], [address], [place], [place] [address]    Report Status 05/04/2020 FINAL  Final  SARS CORONAVIRUS 2 (TAT 6-24 HRS) Nasopharyngeal Nasopharyngeal Swab     Status: None   Collection Time: 04/29/20  9:40 AM   Specimen: Nasopharyngeal Swab  Result Value Ref  Range Status   SARS Coronavirus 2 NEGATIVE NEGATIVE Final    Comment: (NOTE) SARS-CoV-2 target nucleic acids are NOT DETECTED.  The SARS-CoV-2 RNA is generally detectable in upper and lower respiratory specimens during the acute phase of infection. Negative results do not preclude SARS-CoV-2 infection, do not rule out co-infections with other pathogens, and should not be used as the sole basis for treatment or other patient management decisions. Negative results must be combined with clinical observations, patient history, and epidemiological information. The expected result is Negative.  Fact Sheet for Patients: SugarRoll.be  Fact Sheet for Healthcare Providers: https://www.woods-mathews.com/  This test is not yet approved or cleared by the [place] FDA and  has been authorized for detection and/or diagnosis of SARS-CoV-2 by FDA under an Emergency Use Authorization (EUA). This EUA will remain  in effect (meaning this test can be used) for the duration of the COVID-19 declaration under Se ction 564(b)(1) of the Act, 21 U.S.C. section 360bbb-3(b)(1), unless the authorization is terminated or revoked sooner.  Performed at [facility], [place] [address] [place], [place] [address]     Coagulation Studies: No results for input(s): LABPROT, INR in the last 72 hours.  Urinalysis: No results for input(s): COLORURINE, LABSPEC, PHURINE, GLUCOSEU, HGBUR, BILIRUBINUR, KETONESUR, PROTEINUR, UROBILINOGEN, NITRITE, LEUKOCYTESUR in the last 72 hours.  Invalid input(s): APPERANCEUR    Imaging: No results found.   Medications:    . atorvastatin  40 mg Oral Daily  . calcium carbonate  400 mg of elemental calcium Oral TID WC  . collagenase   Topical Daily  . enoxaparin (LOVENOX) injection  30 mg Subcutaneous Q24H  . feeding supplement (NEPRO CARB STEADY)  237 mL Oral BID BM  . fludrocortisone  0.1 mg Oral Daily  . folic acid  1 mg  Oral Daily  . midodrine  10 mg Oral TID WC  . multivitamin with minerals  1 tablet Oral Daily  . polyethylene glycol  17 g Oral Daily  . predniSONE  10 mg Oral Q breakfast  . thiamine  100 mg Oral Daily   Or  . thiamine  100 mg Intravenous Daily   acetaminophen, ondansetron **OR** ondansetron (ZOFRAN) IV  Assessment/ Plan:  Mr. DAILYN KEMPNER is a 70 y.o.  male with past medical history of interstitial lung disease, heart failure with reduced ejection fraction, hypertension, hyperlipidemia, and A. fib.   1. Acute Kidney Injury baseline creatinine 0.64 on March 29.  Acute kidney injury secondary to ATN due to IV contrast and aggressive diuresis.  Current level 2.39 Avoid nephrotoxic medications and treatments Will eventually need oral diuretics Encourage oral nutrition-advanced to solids No acute need for dialysis at this time Continue Midodrine to prevent hypotension  Lab Results  Component Value Date  CREATININE 2.39 (H) 05/10/2020   CREATININE 2.46 (H) 05/09/2020   CREATININE 3.05 (H) 05/08/2020    Intake/Output Summary (Last 24 hours) at 05/10/2020 1036 Last data filed at 05/09/2020 1400 Gross per 24 hour  Intake --  Output 450 ml  Net -450 ml   2. Acute on Chronic Heart failure with EF <20% Refused LifeVest and ran out of medications at home DNR status Lasix in ED ECHO 04/19/20-severely decreased function of left ventricle, EF <20, and global hypokinesis.Grade 2 diastolic dysfunction.   3. Shortness of breath -resolved   LOS: 10 [place] 4/8/202210:36 AM  Patient was seen and evaluated with Colon Flattery, NP.  Plan of care was discussed with patient as well as NP.  I agree with the note as documented with edits

## 2020-05-10 NOTE — Progress Notes (Signed)
PROGRESS NOTE    Jason Fitzgerald  RKY:[id number] DOB: October 02, 1950 DOA: 04/29/2020 PCP: Steele Sizer, MD     Brief Narrative:  Jason Fitzgerald a 70 y.o.malewith medical history significant forhypertension, interstitial lung disease, heart failure reduced ejection fraction, hyperlipidemia, history of atrial fibrillation, presents emergency department by EMS for shortness of breath. Patient's condition has been progressively getting worse over the last week, he has a severe shortness of breath with minimal exertion, he has been sleeping in a recliner, frequent paroxysmal nocturnal dyspnea. He also drinks alcohol and he used cocaine.Upon arriving the hospital, he was hypotensive. Lasix been on hold due to rising kidney function.  Case has been discussed with nephrology.  Patient did have contrast exposure on 3/28 and had a subsequent rise in creatinine since that time.  AKI felt to be multifactorial due to diuresis in the setting of contrast exposure.  New events last 24 hours / Subjective: Patient complaining of excessive saliva, spitting up today.  Denies worsening shortness of breath.  Assessment & Plan:   Principal Problem:   Shortness of breath Active Problems:   Arteriosclerosis of coronary artery   HTN (hypertension)   Interstitial lung disease (HCC)   Chronic cough   Degenerative arthritis of lumbar spine   Chronic systolic CHF (congestive heart failure) (HCC)   AF (paroxysmal atrial fibrillation) (HCC)   Rheumatoid arthritis involving multiple sites with positive rheumatoid factor (HCC)   Fibrosis, pulmonary, interstitial, diffuse (HCC)   GERD (gastroesophageal reflux disease)   COPD (chronic obstructive pulmonary disease) (HCC)   CHF (congestive heart failure) (HCC)   Anemia   Atrial fibrillation (HCC)   Non compliance w medication regimen   DNR (do not resuscitate)   Prolonged QT interval   Acute on chronic systolic (congestive) heart failure (HCC)   Sepsis  (HCC)   Aspiration pneumonia of both lower lobes due to gastric secretions (HCC)    AKI -Baseline creatinine 0.6 -Due to diuresis, intravascular depletion, hypotension, contrast on 3/28 -Hold further diuresis, Farxiga -Creatinine with improvement overnight -Nephrology following  Acute on chronic systolic heart failure -Appears to be reaching end-stage CHF, 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, drug abuse -Lasix, Farxiga, Entresto held due to AKI -Cardiology following  Combined pulmonary fibrosis, emphysema -Continue prednisone -Pulmonology following  Hypotension -Continue midodrine, Florinef   Paroxysmal atrial fibrillation -Holding metoprolol due to hypotension -Holding Xarelto, does not appear that patient started this medication after last hospitalization  Liver cirrhosis, history of alcohol abuse, severe hypoalbuminemia, coagulopathy -Check ammonia   Severe sepsis secondary to aspiration pneumonia -Now off cefepime  HLD -Continue lipitor   Alcohol abuse, cocaine abuse -Out of window for alcohol withdrawal at this point  Goals of care -Appreciate palliative care medicine following -Currently remains full code, but has multiple endorgan damage with very poor prognosis overall.  Hospice was recommended by multiple consulting services.  Continue goals of care conversation.   In agreement with assessment of the pressure ulcer as below:  Pressure Injury 05/01/20 Sacrum Right;Left Unstageable - Full thickness tissue loss in which the base of the injury is covered by slough (yellow, tan, gray, green or brown) and/or eschar (tan, brown or black) in the wound bed. (Active)  05/01/20 1747  Location: Sacrum  Location Orientation: Right;Left  Staging: Unstageable - Full thickness tissue loss in which the base of the injury is covered by slough (yellow, tan, gray, green or brown) and/or eschar (tan, brown or black) in the wound bed.  Wound Description (Comments):   Present  on  Admission: Yes     Pressure Injury 05/02/20 Shoulder Left;Posterior Unstageable - Full thickness tissue loss in which the base of the injury is covered by slough (yellow, tan, gray, green or brown) and/or eschar (tan, brown or black) in the wound bed. (Active)  05/02/20   Location: Shoulder  Location Orientation: Left;Posterior  Staging: Unstageable - Full thickness tissue loss in which the base of the injury is covered by slough (yellow, tan, gray, green or brown) and/or eschar (tan, brown or black) in the wound bed.  Wound Description (Comments):   Present on Admission: Yes     Pressure Injury Sacrum (Active)     Location: Sacrum  Location Orientation:   Staging:   Wound Description (Comments):   Present on Admission:          DVT prophylaxis:  enoxaparin (LOVENOX) injection 30 mg Start: 05/07/20 1000 Place TED hose Start: 04/29/20 1251  Code Status: Full code Family Communication: No family at bedside Disposition Plan:  Status is: Inpatient  Remains inpatient appropriate because:Unsafe d/c plan   Dispo: The patient is from: Home              Anticipated d/c is to: SNF              Patient currently is not medically stable to d/c.  Patient with multiple organ dysfunction, need continued goals of care conversation, patient was turned away at SNF due to his history of cocaine abuse, unsafe to return back home as patient lives home alone.  TOC assisting with disposition.  Continue to monitor for improvement in kidney function.   Difficult to place patient Yes    Antimicrobials:  Anti-infectives (From admission, onward)   Start     Dose/Rate Route Frequency Ordered Stop   05/01/20 1600  ceFEPIme (MAXIPIME) 2 g in sodium chloride 0.9 % 100 mL IVPB  Status:  Discontinued        2 g 200 mL/hr over 30 Minutes Intravenous Every 8 hours 05/01/20 1046 05/03/20 1832   04/30/20 1415  ceFEPIme (MAXIPIME) 1 g in sodium chloride 0.9 % 100 mL IVPB  Status:  Discontinued        1  g 200 mL/hr over 30 Minutes Intravenous Every 12 hours 04/30/20 1356 05/01/20 1046   04/30/20 1000  cefTRIAXone (ROCEPHIN) 1 g in sodium chloride 0.9 % 100 mL IVPB  Status:  Discontinued        1 g 200 mL/hr over 30 Minutes Intravenous Every 24 hours 04/29/20 1711 04/30/20 1356   04/30/20 1000  azithromycin (ZITHROMAX) 500 mg in sodium chloride 0.9 % 250 mL IVPB  Status:  Discontinued        500 mg 250 mL/hr over 60 Minutes Intravenous Every 24 hours 04/29/20 1711 04/29/20 1726   04/29/20 1800  doxycycline (VIBRAMYCIN) 100 mg in sodium chloride 0.9 % 250 mL IVPB  Status:  Discontinued       Note to Pharmacy: Please do not give azithromycin. He has prolong QT   100 mg 125 mL/hr over 120 Minutes Intravenous Every 12 hours 04/29/20 1726 04/30/20 1356   04/29/20 1130  ceFEPIme (MAXIPIME) 2 g in sodium chloride 0.9 % 100 mL IVPB        2 g 200 mL/hr over 30 Minutes Intravenous  Once 04/29/20 1125 04/29/20 1224   04/29/20 1130  metroNIDAZOLE (FLAGYL) IVPB 500 mg        500 mg 100 mL/hr over 60 Minutes Intravenous  Once  04/29/20 1125 04/29/20 1351   04/29/20 1130  vancomycin (VANCOCIN) IVPB 1000 mg/200 mL premix        1,000 mg 200 mL/hr over 60 Minutes Intravenous  Once 04/29/20 1125 04/29/20 1351        Objective: Vitals:   05/09/20 2003 05/10/20 0427 05/10/20 0839 05/10/20 1146  BP: 102/79 105/75 100/71 95/83  Pulse: 83 78 71 91  Resp: 17 17 18 18   Temp: (!) 97.5 F (36.4 C) 98 F (36.7 C) (!) 97.4 F (36.3 C) (!) 97.5 F (36.4 C)  TempSrc:  Oral Oral Oral  SpO2: 94% 98% 100% 99%  Weight:  77.3 kg    Height:        Intake/Output Summary (Last 24 hours) at 05/10/2020 1258 Last data filed at 05/09/2020 1400 Gross per 24 hour  Intake --  Output 450 ml  Net -450 ml   Filed Weights   05/08/20 0408 05/09/20 0546 05/10/20 0427  Weight: 78.5 kg 77.8 kg 77.3 kg    Examination:  General exam: Appears calm and comfortable, frail Respiratory system: Clear to auscultation  anteriorly without distress.  Remains on room air.  Cardiovascular system: S1 & S2 heard, RRR. No murmurs. No pedal edema. Gastrointestinal system: Abdomen is nondistended, soft and nontender. Normal bowel sounds heard. Central nervous system: Alert. No focal neurological deficits.  Extremities: Symmetric in appearance  Skin: No rashes, lesions or ulcers on exposed skin  Psychiatry: Stable   Data Reviewed: I have personally reviewed following labs and imaging studies  CBC: Recent Labs  Lab 05/06/20 0728 05/07/20 0851 05/08/20 0853 05/09/20 1338 05/10/20 0501  WBC 10.5 7.1 10.3 9.4 9.4  HGB 11.4* 11.2* 12.2* 11.6* 12.2*  HCT 32.9* 33.1* 36.9* 35.1* 38.1*  MCV 77.8* 78.6* 81.1 80.1 83.9  PLT 221 184 149* 150 308*   Basic Metabolic Panel: Recent Labs  Lab 05/06/20 0843 05/07/20 0851 05/08/20 0853 05/09/20 1338 05/10/20 0501  NA 136 134* 135 135 136  K 3.7 3.9 4.3 4.2 4.2  CL 99 97* 100 98 99  CO2 25 23 18* 22 21*  GLUCOSE 115* 126* 106* 166* 105*  BUN 72* 82* 89* 92* 93*  CREATININE 2.73* 3.04* 3.05* 2.46* 2.39*  CALCIUM 8.7* 8.7* 8.6* 8.9 8.9  MG 2.7* 2.9* 3.2* 3.1* 3.1*   GFR: Estimated Creatinine Clearance: 32.3 mL/min (A) (by C-G formula based on SCr of 2.39 mg/dL (H)). Liver Function Tests: No results for input(s): AST, ALT, ALKPHOS, BILITOT, PROT, ALBUMIN in the last 168 hours. No results for input(s): LIPASE, AMYLASE in the last 168 hours. No results for input(s): AMMONIA in the last 168 hours. Coagulation Profile: No results for input(s): INR, PROTIME in the last 168 hours. Cardiac Enzymes: No results for input(s): CKTOTAL, CKMB, CKMBINDEX, TROPONINI in the last 168 hours. BNP (last 3 results) No results for input(s): PROBNP in the last 8760 hours. HbA1C: No results for input(s): HGBA1C in the last 72 hours. CBG: No results for input(s): GLUCAP in the last 168 hours. Lipid Profile: No results for input(s): CHOL, HDL, LDLCALC, TRIG, CHOLHDL, LDLDIRECT  in the last 72 hours. Thyroid Function Tests: No results for input(s): TSH, T4TOTAL, FREET4, T3FREE, THYROIDAB in the last 72 hours. Anemia Panel: No results for input(s): VITAMINB12, FOLATE, FERRITIN, TIBC, IRON, RETICCTPCT in the last 72 hours. Sepsis Labs: No results for input(s): PROCALCITON, LATICACIDVEN in the last 168 hours.  No results found for this or any previous visit (from the past 240 hour(s)).  Radiology Studies: No results found.    Scheduled Meds: . atorvastatin  40 mg Oral Daily  . calcium carbonate  400 mg of elemental calcium Oral TID WC  . collagenase   Topical Daily  . enoxaparin (LOVENOX) injection  30 mg Subcutaneous Q24H  . feeding supplement (NEPRO CARB STEADY)  237 mL Oral BID BM  . fludrocortisone  0.1 mg Oral Daily  . folic acid  1 mg Oral Daily  . midodrine  10 mg Oral TID WC  . multivitamin with minerals  1 tablet Oral Daily  . polyethylene glycol  17 g Oral Daily  . predniSONE  10 mg Oral Q breakfast  . thiamine  100 mg Oral Daily   Or  . thiamine  100 mg Intravenous Daily   Continuous Infusions:   LOS: 10 days      Time spent: 35 minutes   Dessa Phi, DO [facility] 05/10/2020, 12:58 PM   Available via Epic secure chat 7am-7pm After these hours, please refer to coverage provider listed on [email or web address]

## 2020-05-10 NOTE — Progress Notes (Addendum)
Speech Language Pathology Treatment: Dysphagia  Patient Details Name: Jason Fitzgerald MRN: [id number] DOB: [birth year] Today's Date: 05/10/2020 Time: 8003-4917 SLP Time Calculation (min) (ACUTE ONLY): 25 min  Assessment / Plan / Recommendation Clinical Impression  Pt seen for ongoing assessment of swallowing; toleration of mech soft diet recently upgraded this week -- pt was "tired of mushy foods". He appears improved in energy and alertness -- sitting in chair at bedside. He was verbally responsive and able to follow instructions w/ min cues though min distracted, often talking. On RA; wbc wnl. Noted phlegm and wet vocal quality during general conversation and expectoration of much phlegm(in cups on tray table too). MD alerted.  Pt explained general aspiration precautions and agreed verbally to the need for following them especially sitting upright for all oral intake and taking small sips/bites. Pt fed self trials of thin liquids and soft solid(banana). No overt clinical s/s of aspiration were noted w/ any consistency; respiratory status remained calm and unlabored, vocal quality grossly clear b/t trials but often Phlegmy again. Pt sipped Gingerale and juice via Straw (requested) following instructions for single, small sips slowly. Oral phase appeared grossly [facility] for bolus management and timely A-P transfer for swallowing; oral clearing achieved w/ all consistencies. Pt stated he had to take his Time for full mastication of some foods(missing some dentition) -- noted this w/ the breakfast potatoes on the plate. Discussed the benefit of moistening foods for ease of mastication and using soups at meals to do so when needed. Pt stated he was still taking Pills 1 at a time w/ water sips d/t pt's Refusal to take them in applesauce or ice cream. No negative reports of tolerance per NSG notes in chart.  Pt stated he "liked the foods whole this way".  Recommend continue a Dysphagia level 3 diet (mech soft w/  meats well-chopped for ease of mastication) w/ gravies added to moisten foods; Thin liquids. Recommend general aspiration precautions; Pills 1 at a time w/ water or Whole in Puree if deemed needed per NSG for safer swallowing; tray setup and positioning assistance for meals. ST services can be available for any further education as needed while admitted. NSG updated. Precautions posted at bedside. Pt agreed stating he was doing "fine". MD consulted re: the degree of Phlegm noted this session. Suspect impact from Esophageal irritation Baseline(report of ETOH abuse prior). Possible need for GI f/u, PPI. Per MD notes, pt also has comorbidities of Pulmonary fibrosis, emphysema, chronic hypoxemic respiratory failure, chronic cough who has had recurrent hospitalization for respiratory failure. He has previously been referred to hospice due to comorbid health status however has declined comfort care measures and wishes to continue therapy. He had Transthoracic echo 04/18/20 and it showed severe CHF with EF <20%. Palliative Care is following.      HPI HPI: Per admitting H&P "Jason Fitzgerald is a 70 y.o. male with medical history significant for hypertension, interstitial lung disease, heart failure reduced ejection fraction, hyperlipidemia, history of atrial fibrillation, presents emergency department by EMS for shortness of breath.     [name] states that his shortness of breath is worse with exertion and laying flat.  He reports that shortness of breath started this morning.  He denies fever, chills, nausea, vomiting, diarrhea, chest pain.  He does endorse coughing of white phlegm.  Denies weight gain.  He denies changes to diet. He states this feels like his previous episodes of heart failure exacerbation.     He states to  me that he has not had his medication for 1 to 2 weeks.  He states he ran out.  He does endorse to me that he drinks alcohol daily and last week and he had a party/guess over.  "Jason Fitzgerald  is a 70 y.o. male with medical history significant for hypertension, interstitial lung disease, heart failure reduced ejection fraction, hyperlipidemia, history of atrial fibrillation, presents emergency department by EMS for shortness of breath.     [name] states that his shortness of breath is worse with exertion and laying flat.  He reports that shortness of breath started this morning.  He denies fever, chills, nausea, vomiting, diarrhea, chest pain.  He does endorse coughing of white phlegm.  Denies weight gain.  He denies changes to diet. He states this feels like his previous episodes of heart failure exacerbation.     He states to me that he has not had his medication for 1 to 2 weeks.  He states he ran out.  He does endorse to me that he drinks alcohol daily and last week and he had a party/guess over."      SLP Plan  All goals met       Recommendations  Diet recommendations: Dysphagia 3 (mechanical soft);Thin liquid (for ease of mastication) Liquids provided via: Cup;Straw (monitor) Medication Administration: Whole meds with liquid (declines using a Puree) Supervision: Patient able to self feed;Intermittent supervision to cue for compensatory strategies Compensations: Minimize environmental distractions;Small sips/bites;Slow rate;Lingual sweep for clearance of pocketing;Follow solids with liquid Postural Changes and/or Swallow Maneuvers: Out of bed for meals;Seated upright 90 degrees;Upright 30-60 min after meal                General recommendations:  (Dietician f/u; Palliative Care f/u) Oral Care Recommendations: Oral care BID;Oral care before and after PO;Patient independent with oral care Follow up Recommendations: None SLP Visit Diagnosis: Dysphagia, unspecified (R13.10) (GI issues; Esopahgeal issues -- P[place]) Plan: All goals met       GO                 Orinda Kenner, MS, CCC-SLP Speech Language Pathologist Rehab  Services [phone] [facility] 05/10/2020, 2:21 PM

## 2020-05-10 NOTE — Progress Notes (Signed)
PT Cancellation Note  Patient Details Name: Jason Fitzgerald MRN: [id number] DOB: 05-21-1950   Cancelled Treatment:     PT attempt. 3rd attempt today. Pt unwilling." I've been giving them blood all day and just can't today. Come back tomorrow."    Willette Pa 05/10/2020, 3:04 PM

## 2020-05-10 NOTE — Progress Notes (Signed)
Mobility Specialist - Progress Note   05/10/20 1300  Mobility  Activity Turned to right side;Turned to left side;Pillow/wedge support  Range of Motion/Exercises Right leg;Left leg (AP, SLR, ABD, SLR)  Level of Assistance Minimal assist, patient does 75% or more  Assistive Device Front wheel walker  Distance Ambulated (ft) 0 ft  Mobility Response Tolerated well  Mobility performed by Mobility specialist  $Mobility charge 1 Mobility    Pt declined OOB activity at this time, but is agreeable to ambulation and transfer to chair later this date. Participated in bed-level therex: ankle pumps, straight leg raises, abduction, and heel slides. Pt voiced difficulty getting knee to bend during performance. Pt turned to L and R side for pillow support to relieve sacral pain.    Kathee Delton Mobility Specialist 05/10/20, 1:08 PM

## 2020-05-10 NOTE — TOC Progression Note (Signed)
Transition of Care [facility]) - Progression Note    Patient Details  Name: Jason Fitzgerald MRN: [id number] Date of Birth: [birth year]  Transition of Care [facility]) CM/SW [place], RN Phone Number: 05/10/2020, 3:36 PM  Clinical Narrative:  Called patient's sister Clint Bolder to discuss discharge plan and that there were no bed acceptance for patient. Sister says she will take care of him, to call her and she will transport him home. Discussed [place] services and she agreed. Will notify Attending for discharge order to include [facility] services.     Expected Discharge Plan: [place] Barriers to Discharge: Continued Medical Work up  Expected Discharge Plan and Services Expected Discharge Plan: [place] In-house Referral: Clinical Social Work   Post Acute Care Choice: [place] Living arrangements for the past 2 months: Single Family Home                                       Social Determinants of Health (SDOH) Interventions    Readmission Risk Interventions Readmission Risk Prevention Plan 05/01/2020 12/12/2019 12/10/2019  Transportation Screening Complete Complete Complete  PCP or Specialist Appt within 3-5 Days - Complete Complete  HRI or Home Care Consult - Complete Complete  Social Work Consult for [place] Planning/Counseling - Complete Complete  Palliative Care Screening - Complete Not Applicable  Medication Review Press photographer) Complete Complete Complete  PCP or Specialist appointment within 3-5 days of discharge Complete - -  [place] or Home Care Consult Complete - -  SW Recovery Care/Counseling Consult Complete - -  Palliative Care Screening Not Applicable - -  Skilled Nursing Facility Complete - -  Some recent data might be hidden

## 2020-05-11 DIAGNOSIS — R0602 Shortness of breath: Secondary | ICD-10-CM | POA: Diagnosis not present

## 2020-05-11 LAB — CBC
HCT: 35.5 % — ABNORMAL LOW (ref 39.0–52.0)
Hemoglobin: 12.6 g/dL — ABNORMAL LOW (ref 13.0–17.0)
MCH: 27.5 pg (ref 26.0–34.0)
MCHC: 35.5 g/dL (ref 30.0–36.0)
MCV: 77.5 fL — ABNORMAL LOW (ref 80.0–100.0)
Platelets: 127 10*3/uL — ABNORMAL LOW (ref 150–400)
RBC: 4.58 MIL/uL (ref 4.22–5.81)
RDW: 16.6 % — ABNORMAL HIGH (ref 11.5–15.5)
WBC: 8.1 10*3/uL (ref 4.0–10.5)
nRBC: 1.1 % — ABNORMAL HIGH (ref 0.0–0.2)

## 2020-05-11 LAB — MAGNESIUM: Magnesium: 3 mg/dL — ABNORMAL HIGH (ref 1.7–2.4)

## 2020-05-11 LAB — BASIC METABOLIC PANEL
Anion gap: 13 (ref 5–15)
BUN: 96 mg/dL — ABNORMAL HIGH (ref 8–23)
CO2: 23 mmol/L (ref 22–32)
Calcium: 8.6 mg/dL — ABNORMAL LOW (ref 8.9–10.3)
Chloride: 97 mmol/L — ABNORMAL LOW (ref 98–111)
Creatinine, Ser: 2.38 mg/dL — ABNORMAL HIGH (ref 0.61–1.24)
GFR, Estimated: 29 mL/min — ABNORMAL LOW (ref >60)
Glucose, Bld: 131 mg/dL — ABNORMAL HIGH (ref 70–99)
Potassium: 3.9 mmol/L (ref 3.5–5.1)
Sodium: 133 mmol/L — ABNORMAL LOW (ref 135–145)

## 2020-05-11 LAB — HEPATIC FUNCTION PANEL
ALT: 23 U/L (ref 0–44)
AST: 35 U/L (ref 15–41)
Albumin: 2.9 g/dL — ABNORMAL LOW (ref 3.5–5.0)
Alkaline Phosphatase: 227 U/L — ABNORMAL HIGH (ref 38–126)
Bilirubin, Direct: 0.6 mg/dL — ABNORMAL HIGH (ref 0.0–0.2)
Indirect Bilirubin: 0.8 mg/dL (ref 0.3–0.9)
Total Bilirubin: 1.4 mg/dL — ABNORMAL HIGH (ref 0.3–1.2)
Total Protein: 7.1 g/dL (ref 6.5–8.1)

## 2020-05-11 NOTE — Progress Notes (Signed)
Pulmonary Medicine          Date: 05/11/2020,   MRN# [id number] Jason Fitzgerald Apr 08, 1950     AdmissionWeight: 74.8 kg                 CurrentWeight: 77.2 kg   Referring physician: Dr Billie Ruddy   CHIEF COMPLAINT:   Acute on chronic hypoxemic respiratory failure in context of pulmonary fibrosis.    HISTORY OF PRESENT ILLNESS   70 yo M w PMH of CHF , COPD, substance abuse , Pulmonary fibrosis, emphysema, chronic hypoxemic respiratory failure, chronic cough who has had recurrent hospitalization for respiratory failure. He has previously been referred to hospice due to comorbid health status however has declined comfort care measures and wishes to continue therapy. He had Transthoracic echo 04/18/20 and it showed severe CHF with EF <84%, RV systolic fucntion is also reduced, there is severe MR. He had CT chest done 12/2019 with findings of basal predominant bilateral pulmonary fibrosis with associated traction bronchiectasis and concomitant upper lobe predominant centriobular emphysema with mild to moderate pleural effusions. Pictorial documentation is included below. Pulmonary consultation placed per patients wishes to have more respiratory workup to help his chronic lung disease.    05/04/20-Patient reports improvement clinically. He feels less dyspnea and wants to try physical therapy more. He has less LE edema.  His urine is dark and renal function worse, discussed with attending physician will hold diuresis. We reviewed incentive spirometry techinique, he is very deconditioned takes volumes up to 350cc only.  He is on room air and BP is low and HR is 74 despite stopping beta blocker and placing on midodrine 10 TID. Cardiology is on case will review regarding re-initiation of beta blockade in lieu of CHF but currently I would favor holding until further guidance due to risk of hypotension.  He is on florinef and prednisone 20 mg due to adrenal insufficiency and interstitial lung disease,  this was started yesterday as salvage therapy for this patient.  We discussed smoking cessation today, he smoked Thursday and states the has not smoked in the hospital which is good.   05/05/20- Patient is sitting up in bed in no distress. He refused blood work today and I discussed this with him.  He worked with [facility] OT service and is improved. Reviewed care plan with Dr Billie Ruddy today.   05/06/2020-patient evaluated at bedside, resting in bed in no acute distress, reports breathing is better..  No acute events overnight.  Repeat blood work noted with significant worsening of his renal impairment.  CBC with differential improved with normalization of WBC count.  SPO2 100% on room air with normal vital signs today.  Ongoing PT and OT. He remains on midodrine has not had diuretics in 24h, appears dry on examination and has increased creatinine today.  I ordered renal evalaution and discussed this with Dr Billie Ruddy.   05/07/20- patient has improved respiratory status but overall his chronic lung disease is severe with combined pulmonary fibrosis and emphysema (CPFE) as well as advanced end stage systolic CHF and renal failure. He is being followed by nephrology and palliative.  His prognosis long term is poor. He is on room air now and speaks in no distress.    05/08/20- patient states he is clinically improved, he is sitting up in chair.  He has refused therapy today. He is wanting to go home. Still on room air. He is weak and deconditined.  He is working with PT/OT.  Renal funcction reduced with elevated anion gap. Have ordered lactic acid for am if he is still here.   05/09/20- patient remains severe deconditoned with malaise and component of what appears to be encephalopathy possible metabolic in etiology, he is not participating with PT.  Reviewed case with nephrology team today.  From pulmonary perspective recommend hospice at this point.  05/10/20- patient is unchanged, he is resting in bed in no distress.  He does show  signs of dementia or confusion/encephalopathy.  He has end stage multi organ failure, not much more to offer from pulmonary perspective.   05/11/20-patient is stable on room air with borderline hypotension, not tachycardic in no acute distress.  TOC with [facility] attending physician working on  plan for possible discharge home due to no bed availability with placement.  Appears to have severe comorbid chronic conditions with overall poor prognosis.  At this time pulmonary will sign off but are available if questions are needed for any reason.    PAST MEDICAL HISTORY   Past Medical History:  Diagnosis Date  . CHF (congestive heart failure) ([place])   . Chronic cough   . COPD (chronic obstructive pulmonary disease) ([place])   . Elevated liver function tests   . Emphysema of lung ([place])   . Fibrosis, pulmonary, interstitial, diffuse ([place])   . GERD (gastroesophageal reflux disease)   . History of cocaine abuse ([place])   . Mediastinal lymphadenopathy   . Pulmonary fibrosis ([place])   . Right inguinal hernia      SURGICAL HISTORY   Past Surgical History:  Procedure Laterality Date  . COLONOSCOPY WITH PROPOFOL N/A 08/19/2018   Procedure: COLONOSCOPY WITH PROPOFOL;  Surgeon: Virgel Manifold, MD;  Location: [facility] ENDOSCOPY;  Service: Endoscopy;  Laterality: N/A;  . HERNIA REPAIR Right 1973   open  . INGUINAL HERNIA REPAIR Right 11/08/2014   Procedure: LAPAROSCOPIC RIGHT INGUINAL HERNIA REPAIR;  Surgeon: Hubbard Robinson, MD;  Location: [facility] ORS;  Service: General;  Laterality: Right;  . knee arthroscopy Right 1972  . RIGHT/LEFT HEART CATH AND CORONARY ANGIOGRAPHY Right 03/05/2017   Procedure: RIGHT/LEFT HEART CATH AND CORONARY ANGIOGRAPHY;  Surgeon: Dionisio David, MD;  Location: [place] [facility];  Service: Cardiovascular;  Laterality: Right;     FAMILY HISTORY   Family History  Problem Relation Age of Onset  . Diabetes Mother   . Cancer Father   . AAA (abdominal aortic aneurysm) Brother       SOCIAL HISTORY   Social History   Tobacco Use  . Smoking status: Never Smoker  . Smokeless tobacco: Never Used  Vaping Use  . Vaping Use: Never used  Substance Use Topics  . Alcohol use: Not Currently    Alcohol/week: 0.0 standard drinks    Comment: 2 quarts a week, he quit again 12/2018  . Drug use: Not Currently    Types: Cocaine    Comment: as an young adult      MEDICATIONS    Home Medication:    Current Medication:  Current Facility-Administered Medications:  .  acetaminophen (TYLENOL) tablet 1,000 mg, 1,000 mg, Oral, TID PRN, Enzo Bi, MD, 1,000 mg at 05/11/20 0353 .  atorvastatin (LIPITOR) tablet 40 mg, 40 mg, Oral, Daily, Cox, Amy N, DO, 40 mg at 05/10/20 0925 .  calcium carbonate (TUMS - dosed in mg elemental calcium) chewable tablet 400 mg of elemental calcium, 400 mg of elemental calcium, Oral, TID WC, Sharen Hones, MD, 400 mg of elemental calcium at 05/09/20  0300 .  collagenase (SANTYL) ointment, , Topical, Daily, Enzo Bi, MD, Given at 05/09/20 1554 .  enoxaparin (LOVENOX) injection 40 mg, 40 mg, Subcutaneous, Q24H, Beers, Shanon Brow, RPH .  feeding supplement (NEPRO CARB STEADY) liquid 237 mL, 237 mL, Oral, BID BM, Singh, Harmeet, MD .  fludrocortisone (FLORINEF) tablet 0.1 mg, 0.1 mg, Oral, Daily, Lanney Gins, Larine Fielding, MD, 0.1 mg at 05/10/20 0926 .  folic acid (FOLVITE) tablet 1 mg, 1 mg, Oral, Daily, Cox, Amy N, DO, 1 mg at 05/10/20 0925 .  midodrine (PROAMATINE) tablet 10 mg, 10 mg, Oral, TID WC, Cox, Amy N, DO, 10 mg at 05/10/20 1618 .  multivitamin with minerals tablet 1 tablet, 1 tablet, Oral, Daily, Cox, Amy N, DO, 1 tablet at 05/10/20 0925 .  ondansetron (ZOFRAN) tablet 4 mg, 4 mg, Oral, Q6H PRN **OR** ondansetron (ZOFRAN) injection 4 mg, 4 mg, Intravenous, Q6H PRN, Cox, Amy N, DO, 4 mg at 05/04/20 0956 .  polyethylene glycol (MIRALAX / GLYCOLAX) packet 17 g, 17 g, Oral, Daily, Enzo Bi, MD, 17 g at 05/10/20 0926 .  predniSONE (DELTASONE) tablet 10  mg, 10 mg, Oral, Q breakfast, Meily Glowacki, MD, 10 mg at 05/10/20 0925 .  scopolamine (TRANSDERM-SCOP) 1 MG/3DAYS 1.5 mg, 1 patch, Transdermal, Q72H, Dessa Phi, DO, 1.5 mg at 05/10/20 1728 .  thiamine tablet 100 mg, 100 mg, Oral, Daily, 100 mg at 05/10/20 0925 **OR** thiamine (B-1) injection 100 mg, 100 mg, Intravenous, Daily, Cox, Amy N, DO, 100 mg at 05/02/20 9233    ALLERGIES   Patient has no known allergies.     REVIEW OF SYSTEMS    Review of Systems:  Gen:  Denies  fever, sweats, chills weigh loss  HEENT: Denies blurred vision, double vision, ear pain, eye pain, hearing loss, nose bleeds, sore throat Cardiac:  No dizziness, chest pain or heaviness, chest tightness,edema Resp:   Denies cough or sputum porduction, shortness of breath,wheezing, hemoptysis,  Gi: Denies swallowing difficulty, stomach pain, nausea or vomiting, diarrhea, constipation, bowel incontinence Gu:  Denies bladder incontinence, burning urine Ext:   Denies Joint pain, stiffness or swelling Skin: Denies  skin rash, easy bruising or bleeding or hives Endoc:  Denies polyuria, polydipsia , polyphagia or weight change Psych:   Denies depression, insomnia or hallucinations   Other:  All other systems negative   VS: BP 102/66 (BP Location: Right Arm)   Pulse 82   Temp (!) 97.4 F (36.3 C) (Oral)   Resp 19   Ht 6' (1.829 m)   Wt 77.2 kg   SpO2 100%   BMI 23.07 kg/m      PHYSICAL EXAM    GENERAL:NAD, no fevers, chills, no weakness no fatigue HEAD: Normocephalic, atraumatic.  EYES: Pupils equal, round, reactive to light. Extraocular muscles intact. No scleral icterus.  MOUTH: Moist mucosal membrane. Dentition intact. No abscess noted.  EAR, NOSE, THROAT: Clear without exudates. No external lesions.  NECK: Supple. No thyromegaly. No nodules. No JVD.  PULMONARY: Mild Velcro crepitations worse at the bases posteriorly.  No wheezing or rhonchorous breath sounds. CARDIOVASCULAR: S1 and S2.  Regular rate and rhythm. No murmurs, rubs, or gallops. No edema. Pedal pulses 2+ bilaterally.  GASTROINTESTINAL: Soft, nontender, nondistended. No masses. Positive bowel sounds. No hepatosplenomegaly.  MUSCULOSKELETAL: No swelling, clubbing, or edema. Range of motion full in all extremities.  NEUROLOGIC: Cranial nerves II through XII are intact. No gross focal neurological deficits. Sensation intact. Reflexes intact.  SKIN: No ulceration, lesions, rashes, or cyanosis. Skin  warm and dry. Turgor intact.  PSYCHIATRIC: Patient seems to have periodic episodes of confusion.     IMAGING    CT Abdomen Pelvis W Contrast  Result Date: 04/29/2020 CLINICAL DATA:  Acute nonlocalized abdominal pain EXAM: CT ABDOMEN AND PELVIS WITH CONTRAST TECHNIQUE: Multidetector CT imaging of the abdomen and pelvis was performed using the standard protocol following bolus administration of intravenous contrast. CONTRAST:  148mL OMNIPAQUE IOHEXOL 300 MG/ML  SOLN COMPARISON:  07/20/2013 FINDINGS: Lower chest: Small bilateral pleural effusion. Fibrosis with honeycombing is seen diffusely at the lung bases. Heart size is within normal limits. Gynecomastia. Hepatobiliary: Heterogeneous density of the liver with nutmeg appearance. Subcapsular right hepatic cyst. No solid masslike finding.Calcification at the gallbladder fossa which appears extraluminal and is chronic. No evidence of biliary inflammation or obstruction. Pancreas: Unremarkable. Spleen: Unremarkable. Adrenals/Urinary Tract: Negative adrenals. No hydronephrosis or stone. Small renal cystic densities. Unremarkable bladder. Stomach/Bowel:  No obstruction. No visible inflammation of bowel. Vascular/Lymphatic: No acute vascular abnormality. Scattered atheromatous calcification of the aorta. No mass or adenopathy. Reproductive:Partially covered right hydrocele Other: No ascites or pneumoperitoneum.  Hernia repair using mesh. Musculoskeletal: No acute abnormalities.  Lumbar spine  degeneration. IMPRESSION: 1. No acute intra-abdominal finding. 2. Heterogeneous liver perfusion which could be from fibrosis or right heart failure. 3. Small pleural effusions. 4.  Aortic Atherosclerosis (ICD10-I70.0). Electronically Signed   By: Monte Fantasia M.D.   On: 04/29/2020 11:31   US RENAL  Result Date: 04/20/2020 CLINICAL DATA:  AK I EXAM: RENAL / URINARY TRACT ULTRASOUND COMPLETE COMPARISON:  None. FINDINGS: Right Kidney: Renal measurements: 11.3 x 5.3 x 6.0 cm = volume: 187 mL. Echogenicity within normal limits. No mass or hydronephrosis visualized. Left Kidney: Renal measurements: 11.7 x 5.5 x 5.8 cm = volume: 194 mL. Echogenicity within normal limits. No mass or hydronephrosis visualized. Bladder: Decompressed with Foley catheter in place. Other: None. IMPRESSION: Unremarkable sonographic appearance of the bilateral kidneys. Electronically Signed   By: Audie Pinto M.D.   On: 04/20/2020 11:49   DG Chest Port 1 View  Result Date: 04/29/2020 CLINICAL DATA:  Shortness of breath and pain. EXAM: PORTABLE CHEST 1 VIEW COMPARISON:  04/18/2020 FINDINGS: Stable cardiomediastinal contours. Extensive fibrotic lung disease is identified within both lungs. This is most advanced within the mid and lower lung zones. No definite superimposed pulmonary opacity. There is a suggestion of subcutaneous gas within the soft tissues in the left supraclavicular region. IMPRESSION: 1. Extensive fibrotic lung disease. 2. No superimposed acute cardiopulmonary abnormality. Electronically Signed   By: Kerby Moors M.D.   On: 04/29/2020 10:43   DG Chest Port 1 View  Result Date: 04/18/2020 CLINICAL DATA:  Chest pain EXAM: PORTABLE CHEST 1 VIEW COMPARISON:  December 08, 2019 chest radiograph; chest CT December 09, 2019 FINDINGS: There is extensive fibrosis throughout the lungs, most severely in the mid and lower lung regions. There are small pleural effusions bilaterally. The heart size is within normal limits.  Pulmonary vascularity is within normal limits. Lymph node prominence noted on prior CT is not well delineated radiography. No bone lesions. IMPRESSION: Extensive underlying fibrosis with small pleural effusions bilaterally. No appreciable new opacity compared to prior studies. Stable cardiac silhouette. Electronically Signed   By: Lowella Grip III M.D.   On: 04/18/2020 09:04   ECHOCARDIOGRAM COMPLETE  Result Date: 04/19/2020    ECHOCARDIOGRAM REPORT   Patient Name:   Jason Fitzgerald Date of Exam: 04/18/2020 Medical Rec #:  [id number]  Height:       69.0 in Accession #:    [id number]      Weight:       171.0 lb Date of Birth:  Apr 22, 1950      BSA:          1.933 m Patient Age:    63 years        BP:           100/68 mmHg Patient Gender: M               HR:           94 bpm. Exam Location:  [facility] Procedure: 2D Echo, Cardiac Doppler and Color Doppler Indications:     CHF-acute systolic H20.94  History:         Patient has prior history of Echocardiogram examinations, most                  recent 12/09/2019. CHF; COPD.  Sonographer:     Sherrie Sport RDCS (AE) Referring Phys:  [id number] [place] Diagnosing Phys: Neoma Laming MD  Sonographer Comments: Suboptimal parasternal window. IMPRESSIONS  1. Left ventricular ejection fraction, by estimation, is <20%. The left ventricle has severely decreased function. The left ventricle demonstrates global hypokinesis. The left ventricular internal cavity size was severely dilated. There is mild concentric left ventricular hypertrophy. Left ventricular diastolic parameters are consistent with Grade II diastolic dysfunction (pseudonormalization).  2. Right ventricular systolic function is mildly reduced. The right ventricular size is moderately enlarged.  3. Left atrial size was severely dilated.  4. Right atrial size was severely dilated.  5. 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 No evidence of mitral stenosis.  6. The aortic valve is  grossly normal. Aortic valve regurgitation is not visualized. Mild aortic valve sclerosis is present, with no evidence of aortic valve stenosis. FINDINGS  Left Ventricle: Left ventricular ejection fraction, by estimation, is <20%. The left ventricle has severely decreased function. The left ventricle demonstrates global hypokinesis. The left ventricular internal cavity size was severely dilated. There is mild concentric left ventricular hypertrophy. Left ventricular diastolic parameters are consistent with Grade II diastolic dysfunction (pseudonormalization). Right Ventricle: The right ventricular size is moderately enlarged. No increase in right ventricular wall thickness. Right ventricular systolic function is mildly reduced. Left Atrium: Left atrial size was severely dilated. Right Atrium: Right atrial size was severely dilated. Pericardium: There is no evidence of pericardial effusion. Mitral Valve: 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 No evidence of mitral valve stenosis. Tricuspid Valve: The tricuspid valve is grossly normal. Tricuspid valve regurgitation is mild. Aortic Valve: The aortic valve is grossly normal. Aortic valve regurgitation is not visualized. Mild aortic valve sclerosis is present, with no evidence of aortic valve stenosis. Aortic valve mean gradient measures 1.0 mmHg. Aortic valve peak gradient measures 1.7 mmHg. Aortic valve area, by VTI measures 1.85 cm. Pulmonic Valve: The pulmonic valve was normal in structure. Pulmonic valve regurgitation is trivial. Aorta: The aortic root, ascending aorta and aortic arch are all structurally normal, with no evidence of dilitation or obstruction. IAS/Shunts: The interatrial septum was not assessed.  LEFT VENTRICLE PLAX 2D LVIDd:         5.57 cm LVIDs:         5.01 cm LV PW:         0.90 cm LV IVS:        0.78 cm LVOT diam:     2.10  cm LV SV:         13 LV SV Index:   7 LVOT Area:     3.46 cm  LV Volumes (MOD) LV vol d, MOD  A2C: 115.0 ml LV vol d, MOD A4C: 194.0 ml LV vol s, MOD A2C: 91.0 ml LV vol s, MOD A4C: 154.0 ml LV SV MOD A2C:     24.0 ml LV SV MOD A4C:     194.0 ml LV SV MOD BP:      27.8 ml RIGHT VENTRICLE RV S prime:     9.79 cm/s TAPSE (M-mode): 3.7 cm LEFT ATRIUM             Index       RIGHT ATRIUM           Index LA diam:        4.30 cm 2.22 cm/m  RA Area:     16.10 cm LA Vol (A2C):   84.0 ml 43.46 ml/m RA Volume:   43.50 ml  22.50 ml/m LA Vol (A4C):   56.0 ml 28.97 ml/m LA Biplane Vol: 69.5 ml 35.96 ml/m  AORTIC VALVE                   PULMONIC VALVE AV Area (Vmax):    1.41 cm    PV Vmax:        0.18 m/s AV Area (Vmean):   1.72 cm    PV Peak grad:   0.1 mmHg AV Area (VTI):     1.85 cm    RVOT Peak grad: 1 mmHg AV Vmax:           66.00 cm/s AV Vmean:          42.200 cm/s AV VTI:            0.072 m AV Peak Grad:      1.7 mmHg AV Mean Grad:      1.0 mmHg LVOT Vmax:         26.80 cm/s LVOT Vmean:        21.000 cm/s LVOT VTI:          0.039 m LVOT/AV VTI ratio: 0.54  AORTA Ao Root diam: 2.83 cm MITRAL VALVE                TRICUSPID VALVE MV Area (PHT): 5.16 cm     TR Peak grad:   32.7 mmHg MV Decel Time: 147 msec     TR Vmax:        286.00 cm/s MV E velocity: 118.00 cm/s                             SHUNTS                             Systemic VTI:  0.04 m                             Systemic Diam: 2.10 cm Neoma Laming MD Electronically signed by Neoma Laming MD Signature Date/Time: 04/19/2020/8:09:07 AM    Final       ASSESSMENT/PLAN    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     -Currently on prednisone 10 mg po daily and florinef .1 tid    -  will try to DC Florinef today and recommend weaning prednisone if possible slowly with transition to 5 mg over the next couple days and finally come off of it completely.  Acute on chronic systolic CHF   - he has 1+ LE edema but he has effusions bilaterally  -S/P Renal  evaluation - appreciate input - holding diuresis, dc nephrotoxins no HD for now   CKD stage 4  -renal consult - appreciate input  - palliative on case appreciate input -Contrast-induced nephropathy with with diuresis.  Continue midodrine to prevent hypotension.   Severe protein calorie malnutrition  bitemporal wasting and peripheral muscle wasting.  -Nutrition and PT/OT   Thank you for allowing me to participate in the care of this patient.   Patient/Family are satisfied with care plan and all questions have been answered.  This document was prepared using Dragon voice recognition software and may include unintentional dictation errors.     Ottie Glazier, M.D.  Division of [place]

## 2020-05-11 NOTE — Progress Notes (Signed)
Mobility Specialist - Progress Note   05/11/20 1158  Mobility  Activity Pillow/wedge support  Mobility performed by Mobility specialist  $Mobility charge 1 Mobility    Pt c/o LUE pain upon arrival and asks mobility for pillow support. Declines further activity despite encouragement. Pt states he's expected to d/c later this date. Will attempt session another date/time as able.    Kathee Delton Mobility Specialist 05/11/20, 12:00 PM

## 2020-05-11 NOTE — Progress Notes (Signed)
PROGRESS NOTE    Jason Fitzgerald  PPI:[id number] DOB: [birth year] DOA: 04/29/2020 PCP: Steele Sizer, MD     Brief Narrative:  Jason Fitzgerald a 70 y.o.malewith medical history significant forhypertension, interstitial lung disease, heart failure reduced ejection fraction, hyperlipidemia, history of atrial fibrillation, presents emergency department by EMS for shortness of breath. Patient's condition has been progressively getting worse over the last week, he has a severe shortness of breath with minimal exertion, he has been sleeping in a recliner, frequent paroxysmal nocturnal dyspnea. He also drinks alcohol and he used cocaine.Upon arriving the hospital, he was hypotensive. Lasix been on hold due to rising kidney function.  Case has been discussed with nephrology.  Patient did have contrast exposure on 3/28 and had a subsequent rise in creatinine since that time.  AKI felt to be multifactorial due to diuresis in the setting of contrast exposure.  New events last 24 hours / Subjective: Patient states that his throat is dry.  He was started on scopolamine patch yesterday due to excessive saliva.  Awaiting for breakfast.  Denies any worsening shortness of breath.  Assessment & Plan:   Principal Problem:   Shortness of breath Active Problems:   Arteriosclerosis of coronary artery   HTN (hypertension)   Interstitial lung disease (HCC)   Chronic cough   Degenerative arthritis of lumbar spine   Chronic systolic CHF (congestive heart failure) (HCC)   AF (paroxysmal atrial fibrillation) (HCC)   Rheumatoid arthritis involving multiple sites with positive rheumatoid factor (HCC)   Fibrosis, pulmonary, interstitial, diffuse (HCC)   GERD (gastroesophageal reflux disease)   COPD (chronic obstructive pulmonary disease) (HCC)   CHF (congestive heart failure) (HCC)   Anemia   Atrial fibrillation (HCC)   Non compliance w medication regimen   DNR (do not resuscitate)   Prolonged QT  interval   Acute on chronic systolic (congestive) heart failure (HCC)   Sepsis (HCC)   Aspiration pneumonia of both lower lobes due to gastric secretions (HCC)    AKI -Baseline creatinine 0.6 -Due to diuresis, intravascular depletion, hypotension, contrast on 3/28 -Hold further diuresis, Farxiga -Creatinine improved but not to baseline -Nephrology following  Acute on chronic systolic heart failure -Appears to be reaching end-stage CHF, 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 Now signed off   Hypotension -Continue midodrine  Paroxysmal atrial fibrillation -Holding metoprolol due to hypotension -Holding Xarelto, does not appear that patient started this medication after last hospitalization  Liver cirrhosis, history of alcohol abuse, severe hypoalbuminemia, coagulopathy -Ammonia 9  Severe sepsis secondary to aspiration pneumonia -Now off cefepime  HLD -Continue lipitor   Alcohol abuse, cocaine abuse -Out of window for alcohol withdrawal at this point  Goals of care -Appreciate palliative care medicine following -Currently remains full code, but has multiple endorgan damage with very poor prognosis overall.  Hospice was recommended by multiple consulting services.  Continue goals of care conversation.   In agreement with assessment of the pressure ulcer as below:  Pressure Injury 05/01/20 Sacrum Right;Left Unstageable - Full thickness tissue loss in which the base of the injury is covered by slough (yellow, tan, gray, green or brown) and/or eschar (tan, brown or black) in the wound bed. (Active)  05/01/20 1747  Location: Sacrum  Location Orientation: Right;Left  Staging: Unstageable - Full thickness tissue loss in which the base of the injury is covered  by slough (yellow,  tan, gray, green or brown) and/or eschar (tan, brown or black) in the wound bed.  Wound Description (Comments):   Present on Admission: Yes     Pressure Injury 05/02/20 Shoulder Left;Posterior Unstageable - Full thickness tissue loss in which the base of the injury is covered by slough (yellow, tan, gray, green or brown) and/or eschar (tan, brown or black) in the wound bed. (Active)  05/02/20   Location: Shoulder  Location Orientation: Left;Posterior  Staging: Unstageable - Full thickness tissue loss in which the base of the injury is covered by slough (yellow, tan, gray, green or brown) and/or eschar (tan, brown or black) in the wound bed.  Wound Description (Comments):   Present on Admission: Yes     Pressure Injury Sacrum (Active)     Location: Sacrum  Location Orientation:   Staging:   Wound Description (Comments):   Present on Admission:          DVT prophylaxis:  enoxaparin (LOVENOX) injection 40 mg Start: 05/11/20 1000 Place TED hose Start: 04/29/20 1251  Code Status: Full code Family Communication: No family at bedside Disposition Plan:  Status is: Inpatient  Remains inpatient appropriate because:Inpatient level of care appropriate due to severity of illness   Dispo: The patient is from: Home              Anticipated d/c is to: Home              Patient currently is not medically stable to d/c.  Patient with multiple organ dysfunction, need continued goals of care conversation, patient was turned away at SNF due to his history of cocaine abuse.  Sister plans to take patient home with home health at time of discharge.  Patient will be medically stable for DC once his creatinine continues to remain stable and improved   Difficult to place patient No    Antimicrobials:  Anti-infectives (From admission, onward)   Start     Dose/Rate Route Frequency Ordered Stop   05/01/20 1600  ceFEPIme (MAXIPIME) 2 g in sodium chloride 0.9 % 100 mL IVPB  Status:  Discontinued         2 g 200 mL/hr over 30 Minutes Intravenous Every 8 hours 05/01/20 1046 05/03/20 1832   04/30/20 1415  ceFEPIme (MAXIPIME) 1 g in sodium chloride 0.9 % 100 mL IVPB  Status:  Discontinued        1 g 200 mL/hr over 30 Minutes Intravenous Every 12 hours 04/30/20 1356 05/01/20 1046   04/30/20 1000  cefTRIAXone (ROCEPHIN) 1 g in sodium chloride 0.9 % 100 mL IVPB  Status:  Discontinued        1 g 200 mL/hr over 30 Minutes Intravenous Every 24 hours 04/29/20 1711 04/30/20 1356   04/30/20 1000  azithromycin (ZITHROMAX) 500 mg in sodium chloride 0.9 % 250 mL IVPB  Status:  Discontinued        500 mg 250 mL/hr over 60 Minutes Intravenous Every 24 hours 04/29/20 1711 04/29/20 1726   04/29/20 1800  doxycycline (VIBRAMYCIN) 100 mg in sodium chloride 0.9 % 250 mL IVPB  Status:  Discontinued       Note to Pharmacy: Please do not give azithromycin. He has prolong QT   100 mg 125 mL/hr over 120 Minutes Intravenous Every 12 hours 04/29/20 1726 04/30/20 1356   04/29/20 1130  ceFEPIme (MAXIPIME) 2 g in sodium chloride 0.9 % 100 mL IVPB        2 g  200 mL/hr over 30 Minutes Intravenous  Once 04/29/20 1125 04/29/20 1224   04/29/20 1130  metroNIDAZOLE (FLAGYL) IVPB 500 mg        500 mg 100 mL/hr over 60 Minutes Intravenous  Once 04/29/20 1125 04/29/20 1351   04/29/20 1130  vancomycin (VANCOCIN) IVPB 1000 mg/200 mL premix        1,000 mg 200 mL/hr over 60 Minutes Intravenous  Once 04/29/20 1125 04/29/20 1351       Objective: Vitals:   05/10/20 1957 05/11/20 0301 05/11/20 0303 05/11/20 0731  BP: 100/74 99/70 96/78  102/66  Pulse: 97 (!) 59 79 82  Resp: 16 17  19   Temp: 97.6 F (36.4 C) 98.6 F (37 C)  (!) 97.4 F (36.3 C)  TempSrc:    Oral  SpO2: 97%  99% 100%  Weight:   77.2 kg   Height:        Intake/Output Summary (Last 24 hours) at 05/11/2020 0952 Last data filed at 05/11/2020 0305 Gross per 24 hour  Intake --  Output 550 ml  Net -550 ml   Filed Weights   05/09/20 0546 05/10/20 0427  05/11/20 0303  Weight: 77.8 kg 77.3 kg 77.2 kg    Examination: General exam: Appears calm and comfortable  Respiratory system: Clear to auscultation anteriorly. Respiratory effort normal.  On room air Cardiovascular system: S1 & S2 heard, RRR. No pedal edema. Gastrointestinal system: Abdomen is nondistended, soft and nontender. Normal bowel sounds heard. Central nervous system: Alert and oriented. Non focal exam. Speech clear  Extremities: Symmetric in appearance bilaterally  Skin: No rashes, lesions or ulcers on exposed skin  Psychiatry: Judgement and insight appear stable. Mood & affect appropriate.    Data Reviewed: I have personally reviewed following labs and imaging studies  CBC: Recent Labs  Lab 05/07/20 0851 05/08/20 0853 05/09/20 1338 05/10/20 0501 05/11/20 0617  WBC 7.1 10.3 9.4 9.4 8.1  HGB 11.2* 12.2* 11.6* 12.2* 12.6*  HCT 33.1* 36.9* 35.1* 38.1* 35.5*  MCV 78.6* 81.1 80.1 83.9 77.5*  PLT 184 149* 150 131* 096*   Basic Metabolic Panel: Recent Labs  Lab 05/07/20 0851 05/08/20 0853 05/09/20 1338 05/10/20 0501 05/11/20 0617  NA 134* 135 135 136 133*  K 3.9 4.3 4.2 4.2 3.9  CL 97* 100 98 99 97*  CO2 23 18* 22 21* 23  GLUCOSE 126* 106* 166* 105* 131*  BUN 82* 89* 92* 93* 96*  CREATININE 3.04* 3.05* 2.46* 2.39* 2.38*  CALCIUM 8.7* 8.6* 8.9 8.9 8.6*  MG 2.9* 3.2* 3.1* 3.1* 3.0*   GFR: Estimated Creatinine Clearance: 32.4 mL/min (A) (by C-G formula based on SCr of 2.38 mg/dL (H)). Liver Function Tests: Recent Labs  Lab 05/11/20 0617  AST 35  ALT 23  ALKPHOS 227*  BILITOT 1.4*  PROT 7.1  ALBUMIN 2.9*   No results for input(s): LIPASE, AMYLASE in the last 168 hours. Recent Labs  Lab 05/10/20 1405  AMMONIA 9   Coagulation Profile: No results for input(s): INR, PROTIME in the last 168 hours. Cardiac Enzymes: No results for input(s): CKTOTAL, CKMB, CKMBINDEX, TROPONINI in the last 168 hours. BNP (last 3 results) No results for input(s): PROBNP  in the last 8760 hours. HbA1C: No results for input(s): HGBA1C in the last 72 hours. CBG: No results for input(s): GLUCAP in the last 168 hours. Lipid Profile: No results for input(s): CHOL, HDL, LDLCALC, TRIG, CHOLHDL, LDLDIRECT in the last 72 hours. Thyroid Function Tests: No results for input(s): TSH, T4TOTAL, FREET4,  T3FREE, THYROIDAB in the last 72 hours. Anemia Panel: No results for input(s): VITAMINB12, FOLATE, FERRITIN, TIBC, IRON, RETICCTPCT in the last 72 hours. Sepsis Labs: No results for input(s): PROCALCITON, LATICACIDVEN in the last 168 hours.  No results found for this or any previous visit (from the past 240 hour(s)).    Radiology Studies: No results found.    Scheduled Meds: . atorvastatin  40 mg Oral Daily  . calcium carbonate  400 mg of elemental calcium Oral TID WC  . collagenase   Topical Daily  . enoxaparin (LOVENOX) injection  40 mg Subcutaneous Q24H  . feeding supplement (NEPRO CARB STEADY)  237 mL Oral BID BM  . folic acid  1 mg Oral Daily  . midodrine  10 mg Oral TID WC  . multivitamin with minerals  1 tablet Oral Daily  . polyethylene glycol  17 g Oral Daily  . predniSONE  10 mg Oral Q breakfast  . scopolamine  1 patch Transdermal Q72H  . thiamine  100 mg Oral Daily   Or  . thiamine  100 mg Intravenous Daily   Continuous Infusions:   LOS: 11 days      Time spent: 20 minutes   Dessa Phi, DO [facility] 05/11/2020, 9:52 AM   Available via Epic secure chat 7am-7pm After these hours, please refer to coverage provider listed on [email or web address]

## 2020-05-11 NOTE — Progress Notes (Signed)
[facility]  PROGRESS NOTE   Subjective:   Patient complains of shortness of breath and cough today Appetite has been poor.  Objective:  Vital signs in last 24 hours:  Temp:  [97.4 F (36.3 C)-98.6 F (37 C)] 97.5 F (36.4 C) (04/09 1118) Pulse Rate:  [59-97] 59 (04/09 1118) Resp:  [16-19] 19 (04/09 1118) BP: (96-113)/(66-80) 97/68 (04/09 1118) SpO2:  [97 %-100 %] 100 % (04/09 1118) Weight:  [77.2 kg] 77.2 kg (04/09 0303)  Weight change: -0.143 kg Filed Weights   05/09/20 0546 05/10/20 0427 05/11/20 0303  Weight: 77.8 kg 77.3 kg 77.2 kg    Intake/Output: I/O last 3 completed shifts: In: -  Out: 550 [Urine:550]   Intake/Output this shift:  No intake/output data recorded.  Physical Exam: General:  No acute distress  Head:  Normocephalic, atraumatic. Moist oral mucosal membranes  Eyes:  Anicteric  Neck:  Supple  Lungs:   Clear to auscultation, normal effort  Heart:  S1S2 no rubs  Abdomen:   Soft, nontender, bowel sounds present  Extremities:  peripheral edema.  Neurologic:  Awake, alert, following commands  Skin:  No lesions  Access:     Basic Metabolic Panel: Recent Labs  Lab 05/07/20 0851 05/08/20 0853 05/09/20 1338 05/10/20 0501 05/11/20 0617  NA 134* 135 135 136 133*  K 3.9 4.3 4.2 4.2 3.9  CL 97* 100 98 99 97*  CO2 23 18* 22 21* 23  GLUCOSE 126* 106* 166* 105* 131*  BUN 82* 89* 92* 93* 96*  CREATININE 3.04* 3.05* 2.46* 2.39* 2.38*  CALCIUM 8.7* 8.6* 8.9 8.9 8.6*  MG 2.9* 3.2* 3.1* 3.1* 3.0*    Liver Function Tests: Recent Labs  Lab 05/11/20 0617  AST 35  ALT 23  ALKPHOS 227*  BILITOT 1.4*  PROT 7.1  ALBUMIN 2.9*   No results for input(s): LIPASE, AMYLASE in the last 168 hours. Recent Labs  Lab 05/10/20 1405  AMMONIA 9    CBC: Recent Labs  Lab 05/07/20 0851 05/08/20 0853 05/09/20 1338 05/10/20 0501 05/11/20 0617  WBC 7.1 10.3 9.4 9.4 8.1  HGB 11.2* 12.2* 11.6* 12.2* 12.6*  HCT 33.1* 36.9* 35.1* 38.1* 35.5*   MCV 78.6* 81.1 80.1 83.9 77.5*  PLT 184 149* 150 131* 127*    Cardiac Enzymes: No results for input(s): CKTOTAL, CKMB, CKMBINDEX, TROPONINI in the last 168 hours.  BNP: Invalid input(s): POCBNP  CBG: No results for input(s): GLUCAP in the last 168 hours.  Microbiology: Results for orders placed or performed during the hospital encounter of 04/29/20  Culture, blood (routine x 2)     Status: None   Collection Time: 04/29/20  9:34 AM   Specimen: BLOOD  Result Value Ref Range Status   Specimen Description BLOOD RIGHT ARM  Final   Special Requests   Final    BOTTLES DRAWN AEROBIC AND ANAEROBIC Blood Culture adequate volume   Culture   Final    NO GROWTH 5 DAYS Performed at [facility], [address] [place], [place] [address]    Report Status 05/04/2020 FINAL  Final  Culture, blood (routine x 2)     Status: None   Collection Time: 04/29/20  9:34 AM   Specimen: BLOOD  Result Value Ref Range Status   Specimen Description BLOOD BLOOD LEFT ARM  Final   Special Requests   Final    BOTTLES DRAWN AEROBIC AND ANAEROBIC Blood Culture adequate volume   Culture   Final    NO  GROWTH 5 DAYS Performed at [facility], [address] [place], [place] [address]    Report Status 05/04/2020 FINAL  Final  SARS CORONAVIRUS 2 (TAT 6-24 HRS) Nasopharyngeal Nasopharyngeal Swab     Status: None   Collection Time: 04/29/20  9:40 AM   Specimen: Nasopharyngeal Swab  Result Value Ref Range Status   SARS Coronavirus 2 NEGATIVE NEGATIVE Final    Comment: (NOTE) SARS-CoV-2 target nucleic acids are NOT DETECTED.  The SARS-CoV-2 RNA is generally detectable in upper and lower respiratory specimens during the acute phase of infection. Negative results do not preclude SARS-CoV-2 infection, do not rule out co-infections with other pathogens, and should not be used as the sole basis for treatment or other patient management decisions. Negative results must be combined with clinical  observations, patient history, and epidemiological information. The expected result is Negative.  Fact Sheet for Patients: SugarRoll.be  Fact Sheet for Healthcare Providers: https://www.woods-mathews.com/  This test is not yet approved or cleared by the [place] FDA and  has been authorized for detection and/or diagnosis of SARS-CoV-2 by FDA under an Emergency Use Authorization (EUA). This EUA will remain  in effect (meaning this test can be used) for the duration of the COVID-19 declaration under Se ction 564(b)(1) of the Act, 21 U.S.C. section 360bbb-3(b)(1), unless the authorization is terminated or revoked sooner.  Performed at [facility], [place] [address], [place],  [address]     Coagulation Studies: No results for input(s): LABPROT, INR in the last 72 hours.  Urinalysis: No results for input(s): COLORURINE, LABSPEC, PHURINE, GLUCOSEU, HGBUR, BILIRUBINUR, KETONESUR, PROTEINUR, UROBILINOGEN, NITRITE, LEUKOCYTESUR in the last 72 hours.  Invalid input(s): APPERANCEUR    Imaging: No results found.   Medications:    . atorvastatin  40 mg Oral Daily  . calcium carbonate  400 mg of elemental calcium Oral TID WC  . collagenase   Topical Daily  . enoxaparin (LOVENOX) injection  40 mg Subcutaneous Q24H  . feeding supplement (NEPRO CARB STEADY)  237 mL Oral BID BM  . folic acid  1 mg Oral Daily  . midodrine  10 mg Oral TID WC  . multivitamin with minerals  1 tablet Oral Daily  . polyethylene glycol  17 g Oral Daily  . predniSONE  10 mg Oral Q breakfast  . scopolamine  1 patch Transdermal Q72H  . thiamine  100 mg Oral Daily   Or  . thiamine  100 mg Intravenous Daily    Assessment/ Plan:     Principal Problem:   Shortness of breath Active Problems:   Arteriosclerosis of coronary artery   HTN (hypertension)   Interstitial lung disease (HCC)   Chronic cough   Degenerative arthritis of lumbar spine    Chronic systolic CHF (congestive heart failure) (HCC)   AF (paroxysmal atrial fibrillation) (HCC)   Rheumatoid arthritis involving multiple sites with positive rheumatoid factor (HCC)   Fibrosis, pulmonary, interstitial, diffuse (HCC)   GERD (gastroesophageal reflux disease)   COPD (chronic obstructive pulmonary disease) (HCC)   CHF (congestive heart failure) (HCC)   Anemia   Atrial fibrillation (HCC)   Non compliance w medication regimen   DNR (do not resuscitate)   Prolonged QT interval   Acute on chronic systolic (congestive) heart failure (HCC)   Sepsis ([place])   Aspiration pneumonia of both lower lobes due to gastric secretions ([place])  70 year old male with history of hypertension, coronary artery disease, congestive heart failure, interstitial lung disease and atrial fibrillation  now with shortness of breath.  Patient also developed acute kidney injury on top of chronic kidney disease.  #1 acute kidney injury most likely due to contrast nephropathy.  Presently is improved to 2.38 today Avoid nephrotoxic medications including IV contrast.  Medications need to be dosed decreased creatinine clearance.  No need for any renal replacement therapy.  #2 shortness of breath and end-stage heart failure.  Diuretics are on hold secondary to acute kidney injury.  Delene Loll is also on hold.  Overall prognosis is poor.  We will continue to monitor during hospitalization.     LOS: [place] 4/9/202212:59 PM

## 2020-05-12 DIAGNOSIS — R0602 Shortness of breath: Secondary | ICD-10-CM | POA: Diagnosis not present

## 2020-05-12 LAB — BASIC METABOLIC PANEL
Anion gap: 15 (ref 5–15)
BUN: 94 mg/dL — ABNORMAL HIGH (ref 8–23)
CO2: 25 mmol/L (ref 22–32)
Calcium: 8.9 mg/dL (ref 8.9–10.3)
Chloride: 96 mmol/L — ABNORMAL LOW (ref 98–111)
Creatinine, Ser: 2.28 mg/dL — ABNORMAL HIGH (ref 0.61–1.24)
GFR, Estimated: 30 mL/min — ABNORMAL LOW (ref >60)
Glucose, Bld: 118 mg/dL — ABNORMAL HIGH (ref 70–99)
Potassium: 4.1 mmol/L (ref 3.5–5.1)
Sodium: 136 mmol/L (ref 135–145)

## 2020-05-12 NOTE — Progress Notes (Signed)
Patient to transfer to room 129. Report called to [facility] on 1C.

## 2020-05-12 NOTE — Progress Notes (Signed)
PROGRESS NOTE    Jason Fitzgerald  KNL:[id number] DOB: 1950-11-29 DOA: 04/29/2020 PCP: Jason Sizer, MD     Brief Narrative:  Jason Fitzgerald a 70 y.o.malewith medical history significant forhypertension, interstitial lung disease, heart failure reduced ejection fraction, hyperlipidemia, history of atrial fibrillation, presents emergency department by EMS for shortness of breath. Patient's condition has been progressively getting worse over the last week, he has a severe shortness of breath with minimal exertion, he has been sleeping in a recliner, frequent paroxysmal nocturnal dyspnea. He also drinks alcohol and he used cocaine.Upon arriving the hospital, he was hypotensive. Lasix been on hold due to rising kidney function.  Case has been discussed with nephrology.  Patient did have contrast exposure on 3/28 and had a subsequent rise in creatinine since that time.  AKI felt to be multifactorial due to diuresis in the setting of contrast exposure.  New events last 24 hours / Subjective: Patient eating breakfast in bed, no physical complaints, wants to be repositioned.  Assessment & Plan:   Principal Problem:   Shortness of breath Active Problems:   Arteriosclerosis of coronary artery   HTN (hypertension)   Interstitial lung disease (HCC)   Chronic cough   Degenerative arthritis of lumbar spine   Chronic systolic CHF (congestive heart failure) (HCC)   AF (paroxysmal atrial fibrillation) (HCC)   Rheumatoid arthritis involving multiple sites with positive rheumatoid factor (HCC)   Fibrosis, pulmonary, interstitial, diffuse (HCC)   GERD (gastroesophageal reflux disease)   COPD (chronic obstructive pulmonary disease) (HCC)   CHF (congestive heart failure) (HCC)   Anemia   Atrial fibrillation (HCC)   Non compliance w medication regimen   DNR (do not resuscitate)   Prolonged QT interval   Acute on chronic systolic (congestive) heart failure (HCC)   Sepsis (HCC)   Aspiration  pneumonia of both lower lobes due to gastric secretions (HCC)    AKI -Baseline creatinine 0.6 -Due to diuresis, intravascular depletion, hypotension, contrast on 3/28 -Hold further diuresis, Farxiga -Creatinine improved but not to baseline -Nephrology following  Acute on chronic systolic heart failure -Appears to be reaching end-stage CHF, 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 Now signed off   Hypotension -Continue midodrine  Paroxysmal atrial fibrillation -Holding metoprolol due to hypotension -Holding Xarelto, does not appear that patient started this medication after last hospitalization  Liver cirrhosis, history of alcohol abuse, severe hypoalbuminemia, coagulopathy -Ammonia 9  Severe sepsis secondary to aspiration pneumonia -Now off cefepime  HLD -Continue lipitor   Alcohol abuse, cocaine abuse -Out of window for alcohol withdrawal at this point  Goals of care -Appreciate palliative care medicine following -Currently remains full code, but has multiple endorgan damage with very poor prognosis overall.  Hospice was recommended by multiple consulting services.  Continue goals of care conversation.   In agreement with assessment of the pressure ulcer as below:  Pressure Injury 05/01/20 Sacrum Right;Left Unstageable - Full thickness tissue loss in which the base of the injury is covered by slough (yellow, tan, gray, green or brown) and/or eschar (tan, brown or black) in the wound bed. (Active)  05/01/20 1747  Location: Sacrum  Location Orientation: Right;Left  Staging: Unstageable - Full thickness tissue loss in which the base of the injury is covered by slough (yellow, tan, gray, green or brown) and/or eschar (tan, brown or black) in the wound bed.  Wound  Description (Comments):   Present  on Admission: Yes     Pressure Injury 05/02/20 Shoulder Left;Posterior Unstageable - Full thickness tissue loss in which the base of the injury is covered by slough (yellow, tan, gray, green or brown) and/or eschar (tan, brown or black) in the wound bed. (Active)  05/02/20   Location: Shoulder  Location Orientation: Left;Posterior  Staging: Unstageable - Full thickness tissue loss in which the base of the injury is covered by slough (yellow, tan, gray, green or brown) and/or eschar (tan, brown or black) in the wound bed.  Wound Description (Comments):   Present on Admission: Yes     Pressure Injury Sacrum (Active)     Location: Sacrum  Location Orientation:   Staging:   Wound Description (Comments):   Present on Admission:          DVT prophylaxis:  enoxaparin (LOVENOX) injection 40 mg Start: 05/11/20 1000 Place TED hose Start: 04/29/20 1251  Code Status: Full code Family Communication: No family at bedside Disposition Plan:  Status is: Inpatient  Remains inpatient appropriate because:Inpatient level of care appropriate due to severity of illness   Dispo: The patient is from: Home              Anticipated d/c is to: Home              Patient currently is not medically stable to d/c.  Patient with multiple organ dysfunction, need continued goals of care conversation, patient was turned away at SNF due to his history of cocaine abuse.  Sister plans to take patient home with home health at time of discharge.  Patient will be medically stable for DC once his creatinine continues to remain stable and improved   Difficult to place patient No    Antimicrobials:  Anti-infectives (From admission, onward)   Start     Dose/Rate Route Frequency Ordered Stop   05/01/20 1600  ceFEPIme (MAXIPIME) 2 g in sodium chloride 0.9 % 100 mL IVPB  Status:  Discontinued        2 g 200 mL/hr over 30 Minutes Intravenous Every 8 hours 05/01/20 1046 05/03/20 1832   04/30/20 1415  ceFEPIme  (MAXIPIME) 1 g in sodium chloride 0.9 % 100 mL IVPB  Status:  Discontinued        1 g 200 mL/hr over 30 Minutes Intravenous Every 12 hours 04/30/20 1356 05/01/20 1046   04/30/20 1000  cefTRIAXone (ROCEPHIN) 1 g in sodium chloride 0.9 % 100 mL IVPB  Status:  Discontinued        1 g 200 mL/hr over 30 Minutes Intravenous Every 24 hours 04/29/20 1711 04/30/20 1356   04/30/20 1000  azithromycin (ZITHROMAX) 500 mg in sodium chloride 0.9 % 250 mL IVPB  Status:  Discontinued        500 mg 250 mL/hr over 60 Minutes Intravenous Every 24 hours 04/29/20 1711 04/29/20 1726   04/29/20 1800  doxycycline (VIBRAMYCIN) 100 mg in sodium chloride 0.9 % 250 mL IVPB  Status:  Discontinued       Note to Pharmacy: Please do not give azithromycin. He has prolong QT   100 mg 125 mL/hr over 120 Minutes Intravenous Every 12 hours 04/29/20 1726 04/30/20 1356   04/29/20 1130  ceFEPIme (MAXIPIME) 2 g in sodium chloride 0.9 % 100 mL IVPB        2 g 200 mL/hr over 30 Minutes Intravenous  Once 04/29/20 1125 04/29/20 1224   04/29/20 1130  metroNIDAZOLE (FLAGYL) IVPB  500 mg        500 mg 100 mL/hr over 60 Minutes Intravenous  Once 04/29/20 1125 04/29/20 1351   04/29/20 1130  vancomycin (VANCOCIN) IVPB 1000 mg/200 mL premix        1,000 mg 200 mL/hr over 60 Minutes Intravenous  Once 04/29/20 1125 04/29/20 1351       Objective: Vitals:   05/11/20 1645 05/11/20 1958 05/12/20 0749 05/12/20 1200  BP: 98/74 100/80 105/72 94/69  Pulse: 63 81 84 79  Resp: 19 16 19 18   Temp:  (!) 97.5 F (36.4 C) (!) 97.5 F (36.4 C) (!) 97.5 F (36.4 C)  TempSrc:  Oral Oral Oral  SpO2: 100% 100% 100% 99%  Weight:      Height:        Intake/Output Summary (Last 24 hours) at 05/12/2020 1223 Last data filed at 05/12/2020 0950 Gross per 24 hour  Intake 360 ml  Output 800 ml  Net -440 ml   Filed Weights   05/09/20 0546 05/10/20 0427 05/11/20 0303  Weight: 77.8 kg 77.3 kg 77.2 kg   Examination: General exam: Appears calm and  comfortable  Respiratory system: Clear to auscultation anteriorly. Respiratory effort normal.  On room air Cardiovascular system: S1 & S2 heard, RRR. No pedal edema. Gastrointestinal system: Abdomen is nondistended, soft and nontender. Normal bowel sounds heard. Central nervous system: Alert and oriented. Non focal exam. Speech clear  Extremities: Symmetric in appearance bilaterally  Skin: No rashes, lesions or ulcers on exposed skin  Psychiatry: Stable  Data Reviewed: I have personally reviewed following labs and imaging studies  CBC: Recent Labs  Lab 05/07/20 0851 05/08/20 0853 05/09/20 1338 05/10/20 0501 05/11/20 0617  WBC 7.1 10.3 9.4 9.4 8.1  HGB 11.2* 12.2* 11.6* 12.2* 12.6*  HCT 33.1* 36.9* 35.1* 38.1* 35.5*  MCV 78.6* 81.1 80.1 83.9 77.5*  PLT 184 149* 150 131* 469*   Basic Metabolic Panel: Recent Labs  Lab 05/07/20 0851 05/08/20 0853 05/09/20 1338 05/10/20 0501 05/11/20 0617 05/12/20 0426  NA 134* 135 135 136 133* 136  K 3.9 4.3 4.2 4.2 3.9 4.1  CL 97* 100 98 99 97* 96*  CO2 23 18* 22 21* 23 25  GLUCOSE 126* 106* 166* 105* 131* 118*  BUN 82* 89* 92* 93* 96* 94*  CREATININE 3.04* 3.05* 2.46* 2.39* 2.38* 2.28*  CALCIUM 8.7* 8.6* 8.9 8.9 8.6* 8.9  MG 2.9* 3.2* 3.1* 3.1* 3.0*  --    GFR: Estimated Creatinine Clearance: 33.9 mL/min (A) (by C-G formula based on SCr of 2.28 mg/dL (H)). Liver Function Tests: Recent Labs  Lab 05/11/20 0617  AST 35  ALT 23  ALKPHOS 227*  BILITOT 1.4*  PROT 7.1  ALBUMIN 2.9*   No results for input(s): LIPASE, AMYLASE in the last 168 hours. Recent Labs  Lab 05/10/20 1405  AMMONIA 9   Coagulation Profile: No results for input(s): INR, PROTIME in the last 168 hours. Cardiac Enzymes: No results for input(s): CKTOTAL, CKMB, CKMBINDEX, TROPONINI in the last 168 hours. BNP (last 3 results) No results for input(s): PROBNP in the last 8760 hours. HbA1C: No results for input(s): HGBA1C in the last 72 hours. CBG: No results  for input(s): GLUCAP in the last 168 hours. Lipid Profile: No results for input(s): CHOL, HDL, LDLCALC, TRIG, CHOLHDL, LDLDIRECT in the last 72 hours. Thyroid Function Tests: No results for input(s): TSH, T4TOTAL, FREET4, T3FREE, THYROIDAB in the last 72 hours. Anemia Panel: No results for input(s): VITAMINB12, FOLATE, FERRITIN, TIBC,  IRON, RETICCTPCT in the last 72 hours. Sepsis Labs: No results for input(s): PROCALCITON, LATICACIDVEN in the last 168 hours.  No results found for this or any previous visit (from the past 240 hour(s)).    Radiology Studies: No results found.    Scheduled Meds: . atorvastatin  40 mg Oral Daily  . calcium carbonate  400 mg of elemental calcium Oral TID WC  . collagenase   Topical Daily  . enoxaparin (LOVENOX) injection  40 mg Subcutaneous Q24H  . feeding supplement (NEPRO CARB STEADY)  237 mL Oral BID BM  . folic acid  1 mg Oral Daily  . midodrine  10 mg Oral TID WC  . multivitamin with minerals  1 tablet Oral Daily  . polyethylene glycol  17 g Oral Daily  . predniSONE  10 mg Oral Q breakfast  . scopolamine  1 patch Transdermal Q72H  . thiamine  100 mg Oral Daily   Or  . thiamine  100 mg Intravenous Daily   Continuous Infusions:   LOS: 12 days      Time spent: 20 minutes   Dessa Phi, DO [facility] 05/12/2020, 12:23 PM   Available via Epic secure chat 7am-7pm After these hours, please refer to coverage provider listed on [email or web address]

## 2020-05-12 NOTE — Progress Notes (Signed)
[facility]  PROGRESS NOTE   Subjective:   Patient seen at bedside today. He feels better than yesterday. Cough is improved.  Objective:  Vital signs in last 24 hours:  Temp:  [97.5 F (36.4 C)] 97.5 F (36.4 C) (04/10 1520) Pulse Rate:  [79-84] 79 (04/10 1520) Resp:  [15-19] 15 (04/10 1520) BP: (94-105)/(69-80) 102/73 (04/10 1520) SpO2:  [99 %-100 %] 99 % (04/10 1520)  Weight change:  Filed Weights   05/09/20 0546 05/10/20 0427 05/11/20 0303  Weight: 77.8 kg 77.3 kg 77.2 kg    Intake/Output: I/O last 3 completed shifts: In: 240 [P.O.:240] Out: 1000 [Urine:1000]   Intake/Output this shift:  Total I/O In: 240 [P.O.:240] Out: 400 [Urine:400]  Physical Exam: General:  No acute distress  Head:  Normocephalic, atraumatic. Moist oral mucosal membranes  Eyes:  Anicteric  Neck:  Supple  Lungs:   Clear to auscultation, normal effort  Heart:  S1S2 no rubs  Abdomen:   Soft, nontender, bowel sounds present  Extremities:  peripheral edema.  Neurologic:  Awake, alert, following commands  Skin:  No lesions  Access:     Basic Metabolic Panel: Recent Labs  Lab 05/07/20 0851 05/08/20 0853 05/09/20 1338 05/10/20 0501 05/11/20 0617 05/12/20 0426  NA 134* 135 135 136 133* 136  K 3.9 4.3 4.2 4.2 3.9 4.1  CL 97* 100 98 99 97* 96*  CO2 23 18* 22 21* 23 25  GLUCOSE 126* 106* 166* 105* 131* 118*  BUN 82* 89* 92* 93* 96* 94*  CREATININE 3.04* 3.05* 2.46* 2.39* 2.38* 2.28*  CALCIUM 8.7* 8.6* 8.9 8.9 8.6* 8.9  MG 2.9* 3.2* 3.1* 3.1* 3.0*  --     Liver Function Tests: Recent Labs  Lab 05/11/20 0617  AST 35  ALT 23  ALKPHOS 227*  BILITOT 1.4*  PROT 7.1  ALBUMIN 2.9*   No results for input(s): LIPASE, AMYLASE in the last 168 hours. Recent Labs  Lab 05/10/20 1405  AMMONIA 9    CBC: Recent Labs  Lab 05/07/20 0851 05/08/20 0853 05/09/20 1338 05/10/20 0501 05/11/20 0617  WBC 7.1 10.3 9.4 9.4 8.1  HGB 11.2* 12.2* 11.6* 12.2* 12.6*  HCT 33.1*  36.9* 35.1* 38.1* 35.5*  MCV 78.6* 81.1 80.1 83.9 77.5*  PLT 184 149* 150 131* 127*    Cardiac Enzymes: No results for input(s): CKTOTAL, CKMB, CKMBINDEX, TROPONINI in the last 168 hours.  BNP: Invalid input(s): POCBNP  CBG: No results for input(s): GLUCAP in the last 168 hours.  Microbiology: Results for orders placed or performed during the hospital encounter of 04/29/20  Culture, blood (routine x 2)     Status: None   Collection Time: 04/29/20  9:34 AM   Specimen: BLOOD  Result Value Ref Range Status   Specimen Description BLOOD RIGHT ARM  Final   Special Requests   Final    BOTTLES DRAWN AEROBIC AND ANAEROBIC Blood Culture adequate volume   Culture   Final    NO GROWTH 5 DAYS Performed at [facility], [address] [place], [place] [address]    Report Status 05/04/2020 FINAL  Final  Culture, blood (routine x 2)     Status: None   Collection Time: 04/29/20  9:34 AM   Specimen: BLOOD  Result Value Ref Range Status   Specimen Description BLOOD BLOOD LEFT ARM  Final   Special Requests   Final    BOTTLES DRAWN AEROBIC AND ANAEROBIC Blood Culture adequate volume   Culture  Final    NO GROWTH 5 DAYS Performed at [facility], [address] [place], [place] [address]    Report Status 05/04/2020 FINAL  Final  SARS CORONAVIRUS 2 (TAT 6-24 HRS) Nasopharyngeal Nasopharyngeal Swab     Status: None   Collection Time: 04/29/20  9:40 AM   Specimen: Nasopharyngeal Swab  Result Value Ref Range Status   SARS Coronavirus 2 NEGATIVE NEGATIVE Final    Comment: (NOTE) SARS-CoV-2 target nucleic acids are NOT DETECTED.  The SARS-CoV-2 RNA is generally detectable in upper and lower respiratory specimens during the acute phase of infection. Negative results do not preclude SARS-CoV-2 infection, do not rule out co-infections with other pathogens, and should not be used as the sole basis for treatment or other patient management decisions. Negative results must  be combined with clinical observations, patient history, and epidemiological information. The expected result is Negative.  Fact Sheet for Patients: SugarRoll.be  Fact Sheet for Healthcare Providers: https://www.woods-mathews.com/  This test is not yet approved or cleared by the [place] FDA and  has been authorized for detection and/or diagnosis of SARS-CoV-2 by FDA under an Emergency Use Authorization (EUA). This EUA will remain  in effect (meaning this test can be used) for the duration of the COVID-19 declaration under Se ction 564(b)(1) of the Act, 21 U.S.C. section 360bbb-3(b)(1), unless the authorization is terminated or revoked sooner.  Performed at [facility], [place] [address] [place],  [address]     Coagulation Studies: No results for input(s): LABPROT, INR in the last 72 hours.  Urinalysis: No results for input(s): COLORURINE, LABSPEC, PHURINE, GLUCOSEU, HGBUR, BILIRUBINUR, KETONESUR, PROTEINUR, UROBILINOGEN, NITRITE, LEUKOCYTESUR in the last 72 hours.  Invalid input(s): APPERANCEUR    Imaging: No results found.   Medications:    . atorvastatin  40 mg Oral Daily  . calcium carbonate  400 mg of elemental calcium Oral TID WC  . collagenase   Topical Daily  . enoxaparin (LOVENOX) injection  40 mg Subcutaneous Q24H  . feeding supplement (NEPRO CARB STEADY)  237 mL Oral BID BM  . folic acid  1 mg Oral Daily  . midodrine  10 mg Oral TID WC  . multivitamin with minerals  1 tablet Oral Daily  . polyethylene glycol  17 g Oral Daily  . predniSONE  10 mg Oral Q breakfast  . scopolamine  1 patch Transdermal Q72H  . thiamine  100 mg Oral Daily   Or  . thiamine  100 mg Intravenous Daily    Assessment/ Plan:     Principal Problem:   Shortness of breath Active Problems:   Arteriosclerosis of coronary artery   HTN (hypertension)   Interstitial lung disease (HCC)   Chronic cough   Degenerative  arthritis of lumbar spine   Chronic systolic CHF (congestive heart failure) (HCC)   AF (paroxysmal atrial fibrillation) (HCC)   Rheumatoid arthritis involving multiple sites with positive rheumatoid factor (HCC)   Fibrosis, pulmonary, interstitial, diffuse (HCC)   GERD (gastroesophageal reflux disease)   COPD (chronic obstructive pulmonary disease) (HCC)   CHF (congestive heart failure) (HCC)   Anemia   Atrial fibrillation (HCC)   Non compliance w medication regimen   DNR (do not resuscitate)   Prolonged QT interval   Acute on chronic systolic (congestive) heart failure (HCC)   Sepsis ([place])   Aspiration pneumonia of both lower lobes due to gastric secretions ([place])  70 year old male with history of hypertension, coronary artery disease, congestive heart failure, interstitial  lung disease and atrial fibrillation now with shortness of breath.  Patient also developed acute kidney injury on top of chronic kidney disease.  #1 acute kidney injury most likely due to contrast nephropathy.  Presently is improved to 2.28 today Avoid nephrotoxic medications including IV contrast.  Medications need to be dosed decreased creatinine clearance.  No need for any renal replacement therapy.  #2 shortness of breath and end-stage heart failure.  Diuretics are on hold secondary to acute kidney injury.  Delene Loll is also on hold.  Overall prognosis is poor.  We will continue to monitor during hospitalization.   LOS: [place], [facility] 4/10/20226:39 PM

## 2020-05-13 DIAGNOSIS — Z66 Do not resuscitate: Secondary | ICD-10-CM

## 2020-05-13 DIAGNOSIS — R0602 Shortness of breath: Secondary | ICD-10-CM | POA: Diagnosis not present

## 2020-05-13 DIAGNOSIS — J449 Chronic obstructive pulmonary disease, unspecified: Secondary | ICD-10-CM

## 2020-05-13 DIAGNOSIS — I480 Paroxysmal atrial fibrillation: Secondary | ICD-10-CM | POA: Diagnosis not present

## 2020-05-13 DIAGNOSIS — A419 Sepsis, unspecified organism: Secondary | ICD-10-CM | POA: Diagnosis not present

## 2020-05-13 DIAGNOSIS — I5023 Acute on chronic systolic (congestive) heart failure: Secondary | ICD-10-CM | POA: Diagnosis not present

## 2020-05-13 LAB — BASIC METABOLIC PANEL
Anion gap: 15 (ref 5–15)
BUN: 101 mg/dL — ABNORMAL HIGH (ref 8–23)
CO2: 25 mmol/L (ref 22–32)
Calcium: 9 mg/dL (ref 8.9–10.3)
Chloride: 98 mmol/L (ref 98–111)
Creatinine, Ser: 2.32 mg/dL — ABNORMAL HIGH (ref 0.61–1.24)
GFR, Estimated: 30 mL/min — ABNORMAL LOW (ref >60)
Glucose, Bld: 111 mg/dL — ABNORMAL HIGH (ref 70–99)
Potassium: 4.2 mmol/L (ref 3.5–5.1)
Sodium: 138 mmol/L (ref 135–145)

## 2020-05-13 MED ORDER — LORAZEPAM 2 MG/ML PO CONC: 1.0000 mg | ORAL | Status: DC | PRN

## 2020-05-13 MED ORDER — BIOTENE DRY MOUTH MT LIQD: 15.0000 mL | OROMUCOSAL | Status: DC | PRN

## 2020-05-13 MED ORDER — LORAZEPAM 1 MG PO TABS: 1.0000 mg | ORAL_TABLET | ORAL | Status: DC | PRN

## 2020-05-13 MED ORDER — POLYETHYLENE GLYCOL 3350 17 G PO PACK: 17.0000 g | PACK | Freq: Every day | ORAL | Status: DC | PRN

## 2020-05-13 MED ORDER — FENTANYL CITRATE (PF) 100 MCG/2ML IJ SOLN
25.0000 ug | INTRAMUSCULAR | Status: DC | PRN
  Administered 2020-05-15: 05:00:00 25 ug via INTRAVENOUS
  Filled 2020-05-13: qty 2

## 2020-05-13 MED ORDER — LORAZEPAM 2 MG/ML IJ SOLN
1.0000 mg | INTRAMUSCULAR | Status: DC | PRN
  Administered 2020-05-13 – 2020-05-14 (×2): 1 mg via INTRAVENOUS
  Filled 2020-05-13 (×2): qty 1

## 2020-05-13 MED ORDER — GLYCOPYRROLATE 0.2 MG/ML IJ SOLN: 0.2000 mg | INTRAMUSCULAR | Status: DC | PRN

## 2020-05-13 MED ORDER — POLYVINYL ALCOHOL 1.4 % OP SOLN
1.0000 [drp] | Freq: Four times a day (QID) | OPHTHALMIC | Status: DC | PRN
  Filled 2020-05-13: qty 15

## 2020-05-13 NOTE — Progress Notes (Signed)
Daily Progress Note   Patient Name: Jason Fitzgerald       Date: 05/13/2020 DOB: [birth year]  Age: 70 y.o. MRN#: [id number] Attending Physician: Dessa Phi, DO Primary Care Physician: Steele Sizer, MD Admit Date: 04/29/2020  Reason for Consultation/Follow-up: Establishing goals of care  Subjective: Patient resting in bed. Confused, alert to self only. Denies pain. Will follow some commands. Appetite remains poor with sips and bites only. Renal function continues to worsen with poor candidacy for renal therapy/HD.  No family at the bedside.   I spoke at length to patient's sister, Regino Schultze. She states she is patient's HCPOA and has legal documentation. I have requested she bring in next time she visits to be filed. She shares patient has an estranged daughter and Amador Cunas who he raised but is not his biological daughter. She reports they completed paper work several years ago identifying her as his Media planner.   We discussed at length patient's current illness and co-morbidities. We discussed patient's poor prognosis and minimal ability to make any meaningful recovery. Regino Schultze verbalized understanding and speaking to patient's poor quality of life and continued decline. She is tearful in expressing his drastic decline over the past 6 months and family's awareness of his "slow dying" process. Emotional support provided.   Education provided on continued medical interventions vs focusing a more palliative comfort path. Regino Schultze is requesting to transition patient's care to focus on his comfort. She is not interested in further medical interventions. She would not want her brother to undergo any artificial feedings, heroic measures, or dialysis. She shares he would not want these measures taken and they have discussed in the past. We discussed what comfort will look for patient. Regino Schultze confirms wishes for DNR/DNI. Sister verbalized understanding and appreciation.   Regino Schultze is requesting hospice  home referral to continue ongoing end-of-life support. Education provided on referral process, approval, and transfer. I also discussed with sister having additional plans in the event patient is unable to go to hospice home and the need to reconsider other options. She verbalized understanding but is hopeful for their acceptance as she speaks to previous encounters with their support and care of family and friends.   All questions answered and support provided.   Length of Stay: 13 days  Vital Signs: BP 92/81 (BP Location: Right Arm)   Pulse 86   Temp 98.4 F (36.9 C)   Resp 15   Ht 6' (1.829 m)   Wt 77.2 kg   SpO2 95%   BMI 23.07 kg/m  SpO2: SpO2: 95 % O2 Device: O2 Device: Nasal Cannula O2 Flow Rate: O2 Flow Rate (L/min): 2 L/min  Physical Exam: Somnolent, cachectic, chronically-ill appearing RRR Diminished bilaterally Will open eyes but quickly closes, will follow some commands, confused, alert to self only            Palliative Care Assessment & Plan   Code Status: DNR  Goals of Care/Recommendations: DNR/DNI as requested and confirmed by Regino Schultze (sister/HCPOA) Transition all care to focus on comfort/EOL No artificial feedings, no HD, no heroic measures Will discontinue medical interventions and minimize medications to focus on comfort only  Sister is requesting residential hospice referral (TOC orders placed) Fentanyl PRN for pain/air hunger/comfort Robinul PRN for excessive secretions Ativan PRN for agitation/anxiety Zofran PRN for nausea Liquifilm tears PRN for dry eyes Haldol PRN for agitation/anxiety May have comfort feeding Unrestricted visitations in the setting of EOL (per policy) Oxygen PRN 2L or less for comfort. No escalation.  PMT will continue to support and follow as needed.   Prognosis: POOR   Discharge Planning: Hospice facility  Thank you for allowing the Palliative Medicine Team to assist in the care of this patient.  Time Total:45 min.    Visit consisted of counseling and education dealing with the complex and emotionally intense issues of symptom management and palliative care in the setting of serious and potentially life-threatening illness.Greater than 50%  of this time was spent counseling and coordinating care related to the above assessment and plan.  Alda Lea, AGPCNP-BC  Palliative Medicine Team [phone]

## 2020-05-13 NOTE — Progress Notes (Signed)
PROGRESS NOTE    Jason KROTZER  DJS:[id number] DOB: 10/02/1950 DOA: 04/29/2020 PCP: Steele Sizer, MD     Brief Narrative:  Jason Ores Mooreis a 70 y.o.malewith medical history significant forhypertension, interstitial lung disease, heart failure reduced ejection fraction, hyperlipidemia, history of atrial fibrillation, presents emergency department by EMS for shortness of breath. Patient's condition has been progressively getting worse over the last week, he has a severe shortness of breath with minimal exertion, he has been sleeping in a recliner, frequent paroxysmal nocturnal dyspnea. He also drinks alcohol and he used cocaine. Upon arriving the hospital, he was hypotensive. Lasix been on hold due to rising kidney function.  Case has been discussed with nephrology.  Patient did have contrast exposure on 3/28 and had a subsequent rise in creatinine since that time.  AKI felt to be multifactorial due to diuresis in the setting of contrast exposure.  New events last 24 hours / Subjective: Patient without complaints on exam today. Denies SOB.   Spoke with his sister Regino Schultze over the phone.  She saw patient earlier this morning.  She states that patient appears to be very sick, waxing and waning in mentation, she is not sure how she can care for him at home.  She states that she does not feel he will live to the end of the month, agreeable to DNR status.  She would like him to be placed in residential hospice if possible.  States that she has power of attorney.  She tells me that patient has told her he will do whatever she decides.   Assessment & Plan:   Principal Problem:   Shortness of breath Active Problems:   Arteriosclerosis of coronary artery   HTN (hypertension)   Interstitial lung disease (HCC)   Chronic cough   Degenerative arthritis of lumbar spine   Chronic systolic CHF (congestive heart failure) (HCC)   AF (paroxysmal atrial fibrillation) (HCC)   Rheumatoid arthritis  involving multiple sites with positive rheumatoid factor (HCC)   Fibrosis, pulmonary, interstitial, diffuse (HCC)   GERD (gastroesophageal reflux disease)   COPD (chronic obstructive pulmonary disease) (HCC)   CHF (congestive heart failure) (HCC)   Anemia   Atrial fibrillation (HCC)   Non compliance w medication regimen   DNR (do not resuscitate)   Prolonged QT interval   Acute on chronic systolic (congestive) heart failure (HCC)   Sepsis (HCC)   Aspiration pneumonia of both lower lobes due to gastric secretions (HCC)    AKI -Baseline creatinine 0.6 -Due to diuresis, intravascular depletion, hypotension, contrast on 3/28 -Hold further diuresis, Farxiga -Creatinine improved but not to baseline -Nephrology following  Acute on chronic systolic heart failure -Appears to be reaching end-stage CHF, 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 Now signed off   Hypotension -Continue midodrine  Paroxysmal atrial fibrillation -Holding metoprolol due to hypotension -Holding Xarelto, does not appear that patient started this medication after last hospitalization  Liver cirrhosis, history of alcohol abuse, severe hypoalbuminemia, coagulopathy -Ammonia 9  Severe sepsis secondary to aspiration pneumonia -Now off cefepime  HLD -Continue lipitor   Alcohol abuse, cocaine abuse -Out of window for alcohol withdrawal at this point  Goals of care -Appreciate palliative care medicine following -Patient's sister agreeable for DNR status, states that she would not want him to have aggressive and heroic resuscitation efforts as patient seems to be approaching end-of-life.  Wants to look into residential hospice.  Updated palliative care medicine and TOC   In agreement with assessment of the pressure ulcer as below:   Pressure Injury 05/01/20 Sacrum Right;Left Unstageable - Full thickness tissue loss in which the base of the injury is covered by slough (yellow, tan, gray, green or brown) and/or eschar (tan, brown or black) in the wound bed. (Active)  05/01/20 1747  Location: Sacrum  Location Orientation: Right;Left  Staging: Unstageable - Full thickness tissue loss in which the base of the injury is covered by slough (yellow, tan, gray, green or brown) and/or eschar (tan, brown or black) in the wound bed.  Wound Description (Comments):   Present on Admission: Yes     Pressure Injury 05/02/20 Shoulder Left;Posterior Unstageable - Full thickness tissue loss in which the base of the injury is covered by slough (yellow, tan, gray, green or brown) and/or eschar (tan, brown or black) in the wound bed. (Active)  05/02/20   Location: Shoulder  Location Orientation: Left;Posterior  Staging: Unstageable - Full thickness tissue loss in which the base of the injury is covered by slough (yellow, tan, gray, green or brown) and/or eschar (tan, brown or black) in the wound bed.  Wound Description (Comments):   Present on Admission: Yes     Pressure Injury Sacrum (Active)     Location: Sacrum  Location Orientation:   Staging:   Wound Description (Comments):   Present on Admission:          DVT prophylaxis:  enoxaparin (LOVENOX) injection 40 mg Start: 05/11/20 1000 Place TED hose Start: 04/29/20 1251  Code Status: Full code Family Communication: No family at bedside, spoke with patient's sister over the phone Disposition Plan:  Status is: Inpatient  Remains inpatient appropriate because:Inpatient level of care appropriate due to severity of illness   Dispo: The patient is from: Home              Anticipated d/c is to: To be determined              Patient currently is not medically stable to d/c.  Patient with multiple organ dysfunction, need continued goals of care conversation, patient was turned away  at SNF due to his history of cocaine abuse.  Looking into possibly residential hospice placement   Difficult to place patient No    Antimicrobials:  Anti-infectives (From admission, onward)   Start     Dose/Rate Route Frequency Ordered Stop   05/01/20 1600  ceFEPIme (MAXIPIME) 2 g in sodium chloride 0.9 % 100 mL IVPB  Status:  Discontinued        2 g 200 mL/hr over 30 Minutes Intravenous Every 8 hours 05/01/20 1046 05/03/20 1832   04/30/20 1415  ceFEPIme (MAXIPIME) 1 g in sodium chloride 0.9 % 100 mL IVPB  Status:  Discontinued        1 g 200 mL/hr over 30 Minutes Intravenous Every 12 hours 04/30/20 1356 05/01/20 1046   04/30/20 1000  cefTRIAXone (ROCEPHIN) 1 g in sodium chloride 0.9 % 100 mL IVPB  Status:  Discontinued        1 g 200 mL/hr over 30 Minutes Intravenous Every 24 hours 04/29/20 1711 04/30/20 1356   04/30/20 1000  azithromycin (ZITHROMAX) 500 mg in sodium chloride 0.9 % 250 mL IVPB  Status:  Discontinued        500 mg 250 mL/hr over 60 Minutes Intravenous Every 24 hours 04/29/20 1711 04/29/20 1726   04/29/20 1800  doxycycline (VIBRAMYCIN) 100 mg in sodium chloride 0.9 % 250 mL IVPB  Status:  Discontinued       Note to Pharmacy: Please do not give azithromycin. He has prolong QT   100 mg 125 mL/hr over 120 Minutes Intravenous Every 12 hours 04/29/20 1726 04/30/20 1356   04/29/20 1130  ceFEPIme (MAXIPIME) 2 g in sodium chloride 0.9 % 100 mL IVPB        2 g 200 mL/hr over 30 Minutes Intravenous  Once 04/29/20 1125 04/29/20 1224   04/29/20 1130  metroNIDAZOLE (FLAGYL) IVPB 500 mg        500 mg 100 mL/hr over 60 Minutes Intravenous  Once 04/29/20 1125 04/29/20 1351   04/29/20 1130  vancomycin (VANCOCIN) IVPB 1000 mg/200 mL premix        1,000 mg 200 mL/hr over 60 Minutes Intravenous  Once 04/29/20 1125 04/29/20 1351       Objective: Vitals:   05/12/20 2016 05/13/20 0402 05/13/20 0453 05/13/20 0741  BP: 109/76 (!) 123/94  90/70  Pulse: 65 74  87  Resp: (!) 21 20  17    Temp: 98 F (36.7 C) 97.6 F (36.4 C)  98.4 F (36.9 C)  TempSrc:  Oral    SpO2: (!) 79% (!) 86% 95% 92%  Weight:      Height:        Intake/Output Summary (Last 24 hours) at 05/13/2020 1104 Last data filed at 05/13/2020 0957 Gross per 24 hour  Intake 240 ml  Output 400 ml  Net -160 ml   Filed Weights   05/09/20 0546 05/10/20 0427 05/11/20 0303  Weight: 77.8 kg 77.3 kg 77.2 kg    Examination: General exam: Appears calm and comfortable, chronically ill-appearing Respiratory system: Clear to auscultation anteriorly without distress Cardiovascular system: S1 & S2 heard, RRR. No pedal edema. Gastrointestinal system: Abdomen is nondistended, soft and nontender. Normal bowel sounds heard. Central nervous system: Alert, nonfocal exam Extremities: Symmetric in appearance bilaterally  Psychiatry: Judgement and insight appear stable.   Data Reviewed: I have personally reviewed following labs and imaging studies  CBC: Recent Labs  Lab 05/07/20 0851 05/08/20 0853 05/09/20 1338 05/10/20 0501 05/11/20 0617  WBC 7.1 10.3 9.4 9.4 8.1  HGB 11.2* 12.2* 11.6* 12.2* 12.6*  HCT 33.1* 36.9* 35.1* 38.1* 35.5*  MCV 78.6* 81.1 80.1 83.9 77.5*  PLT 184 149* 150 131* 034*   Basic Metabolic Panel: Recent Labs  Lab 05/07/20 0851 05/08/20 0853 05/09/20 1338 05/10/20 0501 05/11/20 0617 05/12/20 0426 05/13/20 0452  NA 134* 135 135 136 133* 136 138  K 3.9 4.3 4.2 4.2 3.9 4.1 4.2  CL 97* 100 98 99 97* 96* 98  CO2 23 18* 22 21* 23 25 25   GLUCOSE 126* 106* 166* 105* 131* 118* 111*  BUN 82* 89* 92* 93* 96* 94* 101*  CREATININE 3.04* 3.05* 2.46* 2.39* 2.38* 2.28* 2.32*  CALCIUM 8.7* 8.6* 8.9 8.9 8.6* 8.9 9.0  MG 2.9* 3.2* 3.1* 3.1* 3.0*  --   --    GFR: Estimated Creatinine Clearance: 33.3 mL/min (A) (by C-G formula based on SCr of 2.32 mg/dL (H)). Liver Function Tests: Recent Labs  Lab 05/11/20 0617  AST 35  ALT 23  ALKPHOS 227*  BILITOT 1.4*  PROT 7.1  ALBUMIN 2.9*   No  results for input(s): LIPASE, AMYLASE in the last 168 hours. Recent Labs  Lab 05/10/20 1405  AMMONIA 9   Coagulation Profile: No results for input(s): INR, PROTIME in the last  168 hours. Cardiac Enzymes: No results for input(s): CKTOTAL, CKMB, CKMBINDEX, TROPONINI in the last 168 hours. BNP (last 3 results) No results for input(s): PROBNP in the last 8760 hours. HbA1C: No results for input(s): HGBA1C in the last 72 hours. CBG: No results for input(s): GLUCAP in the last 168 hours. Lipid Profile: No results for input(s): CHOL, HDL, LDLCALC, TRIG, CHOLHDL, LDLDIRECT in the last 72 hours. Thyroid Function Tests: No results for input(s): TSH, T4TOTAL, FREET4, T3FREE, THYROIDAB in the last 72 hours. Anemia Panel: No results for input(s): VITAMINB12, FOLATE, FERRITIN, TIBC, IRON, RETICCTPCT in the last 72 hours. Sepsis Labs: No results for input(s): PROCALCITON, LATICACIDVEN in the last 168 hours.  No results found for this or any previous visit (from the past 240 hour(s)).    Radiology Studies: No results found.    Scheduled Meds: . atorvastatin  40 mg Oral Daily  . calcium carbonate  400 mg of elemental calcium Oral TID WC  . collagenase   Topical Daily  . enoxaparin (LOVENOX) injection  40 mg Subcutaneous Q24H  . feeding supplement (NEPRO CARB STEADY)  237 mL Oral BID BM  . folic acid  1 mg Oral Daily  . midodrine  10 mg Oral TID WC  . multivitamin with minerals  1 tablet Oral Daily  . polyethylene glycol  17 g Oral Daily  . predniSONE  10 mg Oral Q breakfast  . scopolamine  1 patch Transdermal Q72H  . thiamine  100 mg Oral Daily   Or  . thiamine  100 mg Intravenous Daily   Continuous Infusions:   LOS: 13 days      Time spent: 20 minutes   Dessa Phi, DO [facility] 05/13/2020, 11:04 AM   Available via Epic secure chat 7am-7pm After these hours, please refer to coverage provider listed on [email or web address]

## 2020-05-13 NOTE — Progress Notes (Signed)
[facility]  ROUNDING NOTE   Subjective:   Jason Fitzgerald is a 70 year old male with past medical history of interstitial lung disease, heart failure with reduced ejection fraction, hypertension, hyperlipidemia, and A. fib.  He presents to the emergency room with complaints of shortness of breath.  He is admitted for possible volume overload related to heart failure.  We have been consulted due to worsening renal function.  In the setting of diuresing and IV contrast, patient's creatinine has increased.  Patient seen resting in bed Confused today Unsure if he ate breakfast, when asked Denies pain and discomfort   Objective:  Vital signs in last 24 hours:  Temp:  [97.5 F (36.4 C)-98.4 F (36.9 C)] 97.6 F (36.4 C) (04/11 1233) Pulse Rate:  [65-87] 85 (04/11 1233) Resp:  [15-21] 20 (04/11 1233) BP: (90-123)/(69-94) 92/69 (04/11 1233) SpO2:  [79 %-99 %] 95 % (04/11 1240)  Weight change:  Filed Weights   05/09/20 0546 05/10/20 0427 05/11/20 0303  Weight: 77.8 kg 77.3 kg 77.2 kg    Intake/Output: I/O last 3 completed shifts: In: 480 [P.O.:480] Out: 1200 [Urine:1200]   Intake/Output this shift:  Total I/O In: 120 [P.O.:120] Out: -   Physical Exam: General: NAD, resting in bed  Head: Normocephalic, atraumatic. Moist oral mucosal membranes  Lungs:  Clear to auscultation  Heart: Regular rate and rhythm  Abdomen:  Soft, nontender,   Extremities:  trace peripheral edema.  Neurologic: Alert, oriented to self  Skin: No lesions  Foley in place  Basic Metabolic Panel: Recent Labs  Lab 05/07/20 0851 05/08/20 0853 05/09/20 1338 05/10/20 0501 05/11/20 0617 05/12/20 0426 05/13/20 0452  NA 134* 135 135 136 133* 136 138  K 3.9 4.3 4.2 4.2 3.9 4.1 4.2  CL 97* 100 98 99 97* 96* 98  CO2 23 18* 22 21* 23 25 25   GLUCOSE 126* 106* 166* 105* 131* 118* 111*  BUN 82* 89* 92* 93* 96* 94* 101*  CREATININE 3.04* 3.05* 2.46* 2.39* 2.38* 2.28* 2.32*  CALCIUM 8.7*  8.6* 8.9 8.9 8.6* 8.9 9.0  MG 2.9* 3.2* 3.1* 3.1* 3.0*  --   --     Liver Function Tests: Recent Labs  Lab 05/11/20 0617  AST 35  ALT 23  ALKPHOS 227*  BILITOT 1.4*  PROT 7.1  ALBUMIN 2.9*   No results for input(s): LIPASE, AMYLASE in the last 168 hours. Recent Labs  Lab 05/10/20 1405  AMMONIA 9    CBC: Recent Labs  Lab 05/07/20 0851 05/08/20 0853 05/09/20 1338 05/10/20 0501 05/11/20 0617  WBC 7.1 10.3 9.4 9.4 8.1  HGB 11.2* 12.2* 11.6* 12.2* 12.6*  HCT 33.1* 36.9* 35.1* 38.1* 35.5*  MCV 78.6* 81.1 80.1 83.9 77.5*  PLT 184 149* 150 131* 127*    Cardiac Enzymes: No results for input(s): CKTOTAL, CKMB, CKMBINDEX, TROPONINI in the last 168 hours.  BNP: Invalid input(s): POCBNP  CBG: No results for input(s): GLUCAP in the last 168 hours.  Microbiology: Results for orders placed or performed during the hospital encounter of 04/29/20  Culture, blood (routine x 2)     Status: None   Collection Time: 04/29/20  9:34 AM   Specimen: BLOOD  Result Value Ref Range Status   Specimen Description BLOOD RIGHT ARM  Final   Special Requests   Final    BOTTLES DRAWN AEROBIC AND ANAEROBIC Blood Culture adequate volume   Culture   Final    NO GROWTH 5 DAYS Performed at [facility], [address] [place], [place] [address]    Report Status 05/04/2020 FINAL  Final  Culture, blood (routine x 2)     Status: None   Collection Time: 04/29/20  9:34 AM   Specimen: BLOOD  Result Value Ref Range Status   Specimen Description BLOOD BLOOD LEFT ARM  Final   Special Requests   Final    BOTTLES DRAWN AEROBIC AND ANAEROBIC Blood Culture adequate volume   Culture   Final    NO GROWTH 5 DAYS Performed at [facility], [address] [place], [place] [address]    Report Status 05/04/2020 FINAL  Final  SARS CORONAVIRUS 2 (TAT 6-24 HRS) Nasopharyngeal Nasopharyngeal Swab     Status: None   Collection Time: 04/29/20  9:40 AM   Specimen: Nasopharyngeal Swab   Result Value Ref Range Status   SARS Coronavirus 2 NEGATIVE NEGATIVE Final    Comment: (NOTE) SARS-CoV-2 target nucleic acids are NOT DETECTED.  The SARS-CoV-2 RNA is generally detectable in upper and lower respiratory specimens during the acute phase of infection. Negative results do not preclude SARS-CoV-2 infection, do not rule out co-infections with other pathogens, and should not be used as the sole basis for treatment or other patient management decisions. Negative results must be combined with clinical observations, patient history, and epidemiological information. The expected result is Negative.  Fact Sheet for Patients: SugarRoll.be  Fact Sheet for Healthcare Providers: https://www.woods-mathews.com/  This test is not yet approved or cleared by the [place] FDA and  has been authorized for detection and/or diagnosis of SARS-CoV-2 by FDA under an Emergency Use Authorization (EUA). This EUA will remain  in effect (meaning this test can be used) for the duration of the COVID-19 declaration under Se ction 564(b)(1) of the Act, 21 U.S.C. section 360bbb-3(b)(1), unless the authorization is terminated or revoked sooner.  Performed at [facility], [place] [address] [place], [place] [address]     Coagulation Studies: No results for input(s): LABPROT, INR in the last 72 hours.  Urinalysis: No results for input(s): COLORURINE, LABSPEC, PHURINE, GLUCOSEU, HGBUR, BILIRUBINUR, KETONESUR, PROTEINUR, UROBILINOGEN, NITRITE, LEUKOCYTESUR in the last 72 hours.  Invalid input(s): APPERANCEUR    Imaging: No results found.   Medications:    . atorvastatin  40 mg Oral Daily  . calcium carbonate  400 mg of elemental calcium Oral TID WC  . collagenase   Topical Daily  . enoxaparin (LOVENOX) injection  40 mg Subcutaneous Q24H  . feeding supplement (NEPRO CARB STEADY)  237 mL Oral BID BM  . folic acid  1 mg Oral Daily  .  midodrine  10 mg Oral TID WC  . multivitamin with minerals  1 tablet Oral Daily  . polyethylene glycol  17 g Oral Daily  . predniSONE  10 mg Oral Q breakfast  . scopolamine  1 patch Transdermal Q72H  . thiamine  100 mg Oral Daily   Or  . thiamine  100 mg Intravenous Daily   acetaminophen, ondansetron **OR** ondansetron (ZOFRAN) IV  Assessment/ Plan:  Jason Fitzgerald is a 70 y.o.  male with past medical history of interstitial lung disease, heart failure with reduced ejection fraction, hypertension, hyperlipidemia, and A. fib.   1. Acute Kidney Injury baseline creatinine 0.64 on March 29.  Acute kidney injury secondary to ATN due to IV contrast and aggressive diuresis.  Current level 2.32 May be new baseline creatinine Steady BUN increase Will monitor this trend Avoid nephrotoxic medications and treatments No  acute need for dialysis at this time Continue Midodrine   Lab Results  Component Value Date   CREATININE 2.32 (H) 05/13/2020   CREATININE 2.28 (H) 05/12/2020   CREATININE 2.38 (H) 05/11/2020    Intake/Output Summary (Last 24 hours) at 05/13/2020 1447 Last data filed at 05/13/2020 0957 Gross per 24 hour  Intake 240 ml  Output 400 ml  Net -160 ml   2. Acute on Chronic Heart failure with EF <20% Refused LifeVest and ran out of medications at home DNR status Will need diuretic for home management  3. Shortness of breath -resolved   LOS: 13 Jason Fitzgerald 4/11/20222:47 PM  Patient was seen and evaluated with Colon Flattery, NP.  Plan of care was discussed with patient as well as NP.  I agree with the note as documented with edits

## 2020-05-13 NOTE — Care Management Important Message (Signed)
Important Message  Patient Details  Name: Jason Fitzgerald MRN: [id number] Date of Birth: 04/17/1950   Medicare Important Message Given:  Yes     [name] 05/13/2020, 12:02 PM

## 2020-05-13 NOTE — Progress Notes (Signed)
PT Cancellation Note  Patient Details Name: Jason Fitzgerald MRN: [id number] DOB: July 24, 1950   Cancelled Treatment:    Reason Eval/Treat Not Completed: Patient declined, no reason specified. Patient refusing PT at this time, asks that I come back later. Will re-attempt later as time allows.    Ashtin Melichar 05/13/2020, 10:17 AM

## 2020-05-14 DIAGNOSIS — I480 Paroxysmal atrial fibrillation: Secondary | ICD-10-CM | POA: Diagnosis not present

## 2020-05-14 DIAGNOSIS — A419 Sepsis, unspecified organism: Secondary | ICD-10-CM | POA: Diagnosis not present

## 2020-05-14 DIAGNOSIS — R0602 Shortness of breath: Secondary | ICD-10-CM | POA: Diagnosis not present

## 2020-05-14 DIAGNOSIS — I5023 Acute on chronic systolic (congestive) heart failure: Secondary | ICD-10-CM | POA: Diagnosis not present

## 2020-05-14 NOTE — Plan of Care (Signed)
  Problem: Pain Managment: Goal: General experience of comfort will improve Outcome: Progressing   Problem: Safety: Goal: Ability to remain free from injury will improve Outcome: Progressing   Problem: Skin Integrity: Goal: Risk for impaired skin integrity will decrease Outcome: Progressing   

## 2020-05-14 NOTE — Progress Notes (Signed)
    Patient somnolent. Will open eyes to verbal stimuli. Increased confusion. Does not answer many questions and will not follow commands. Appetite remains poor with lunch tray at the bedside untouched. When offering something to drink or a bite, he shakes head no and closes eyes.   No family at the bedside. Sister, Regino Schultze updated. We reviewed goals of care discussion and I provided education on end-of-life expectations. She verbalized understanding.   Patient is pending hospice home placement once approved and bed is available.   All questions answered and support provided.   Assessment -somnolent, chronically ill-appearing, thin RRR Diminished Will not follow commands  Plan -Continue with full comfort care  -PRNs for symptom management -Pending hospice home transfer once bed is available -Sister educated on EOL expectations -PMT will continue to support and follow as needed.    Time Total: 35 min.   Visit consisted of counseling and education dealing with the complex and emotionally intense issues of symptom management and palliative care in the setting of serious and potentially life-threatening illness.Greater than 50%  of this time was spent counseling and coordinating care related to the above assessment and plan.  Alda Lea, AGPCNP-BC  Palliative Medicine Team [phone]

## 2020-05-14 NOTE — Plan of Care (Signed)
Patient is comfort care management, awaiting placement for hospice/facility per family request Problem: Education: Goal: Knowledge of General Education information will improve Description: Including pain rating scale, medication(s)/side effects and non-pharmacologic comfort measures Outcome: Not Applicable   Problem: Health Behavior/Discharge Planning: Goal: Ability to manage health-related needs will improve Outcome: Not Applicable   Problem: Clinical Measurements: Goal: Ability to maintain clinical measurements within normal limits will improve Outcome: Not Met (add Reason) Goal: Will remain free from infection Outcome: Not Met (add Reason) Goal: Diagnostic test results will improve Outcome: Not Met (add Reason) Goal: Respiratory complications will improve Outcome: Not Met (add Reason) Goal: Cardiovascular complication will be avoided Outcome: Not Met (add Reason)

## 2020-05-14 NOTE — Progress Notes (Signed)
PROGRESS NOTE    Jason Fitzgerald  TKW:[id number] DOB: December 09, 1950 DOA: 04/29/2020 PCP: Steele Sizer, MD     Brief Narrative:  Jason Ores Mooreis a 70 y.o.malewith medical history significant forhypertension, interstitial lung disease, heart failure reduced ejection fraction, hyperlipidemia, history of atrial fibrillation, presents emergency department by EMS for shortness of breath. Patient's condition has been progressively getting worse over the last week, he has a severe shortness of breath with minimal exertion, he has been sleeping in a recliner, frequent paroxysmal nocturnal dyspnea. He also drinks alcohol and he used cocaine. Upon arriving the hospital, he was hypotensive. Lasix been on hold due to rising kidney function.  Case has been discussed with nephrology.  Patient did have contrast exposure on 3/28 and had a subsequent rise in creatinine since that time.  AKI felt to be multifactorial due to diuresis in the setting of contrast exposure.  New events last 24 hours / Subjective: Patient seems to be more confused today, he is arousable to voice but does not answer any questions appropriately.  Assessment & Plan:   Principal Problem:   Shortness of breath Active Problems:   Arteriosclerosis of coronary artery   HTN (hypertension)   Interstitial lung disease (HCC)   Chronic cough   Degenerative arthritis of lumbar spine   Chronic systolic CHF (congestive heart failure) (HCC)   AF (paroxysmal atrial fibrillation) (HCC)   Rheumatoid arthritis involving multiple sites with positive rheumatoid factor (HCC)   Fibrosis, pulmonary, interstitial, diffuse (HCC)   GERD (gastroesophageal reflux disease)   COPD (chronic obstructive pulmonary disease) (HCC)   CHF (congestive heart failure) (HCC)   Anemia   Atrial fibrillation (HCC)   Non compliance w medication regimen   DNR (do not resuscitate)   Prolonged QT interval   Acute on chronic systolic (congestive) heart failure  (HCC)   Sepsis (HCC)   Aspiration pneumonia of both lower lobes due to gastric secretions (HCC)    AKI Acute on chronic systolic heart failure  Combined pulmonary fibrosis, emphysema Hypotension Paroxysmal atrial fibrillation Liver cirrhosis, history of alcohol abuse, severe hypoalbuminemia, coagulopathy Severe sepsis secondary to aspiration pneumonia HLD Alcohol abuse, cocaine abuse Goals of care Acute metabolic encephalopathy  Patient with multiorgan failure.  Now with worsening respiratory status and confusion.  Appreciate palliative care medicine, after discussing with family, patient was transition to comfort care.  Overall prognosis is <2 weeks and remains residential hospice appropriate at this time.    In agreement with assessment of the pressure ulcer as below:  Pressure Injury 05/01/20 Sacrum Right;Left Unstageable - Full thickness tissue loss in which the base of the injury is covered by slough (yellow, tan, gray, green or brown) and/or eschar (tan, brown or black) in the wound bed. (Active)  05/01/20 1747  Location: Sacrum  Location Orientation: Right;Left  Staging: Unstageable - Full thickness tissue loss in which the base of the injury is covered by slough (yellow, tan, gray, green or brown) and/or eschar (tan, brown or black) in the wound bed.  Wound Description (Comments):   Present on Admission: Yes     Pressure Injury 05/02/20 Shoulder Left;Posterior Unstageable - Full thickness tissue loss in which the base of the injury is covered by slough (yellow, tan, gray, green or brown) and/or eschar (tan, brown or black) in the wound bed. (Active)  05/02/20   Location: Shoulder  Location Orientation: Left;Posterior  Staging: Unstageable - Full thickness tissue loss in which the base of the injury is covered by slough (yellow,  tan, gray, green or brown) and/or eschar (tan, brown or black) in the wound bed.  Wound Description (Comments):   Present on Admission: Yes      Pressure Injury Sacrum (Active)     Location: Sacrum  Location Orientation:   Staging:   Wound Description (Comments):   Present on Admission:          Code Status: DNR Family Communication: No family at bedside Disposition Plan:  Status is: Inpatient  Remains inpatient appropriate because:Inpatient level of care appropriate due to severity of illness   Dispo: The patient is from: Home              Anticipated d/c is to: Hospice              Patient currently is medically stable to d/c.  Awaiting hospice placement.   Difficult to place patient No    Antimicrobials:  Anti-infectives (From admission, onward)   Start     Dose/Rate Route Frequency Ordered Stop   05/01/20 1600  ceFEPIme (MAXIPIME) 2 g in sodium chloride 0.9 % 100 mL IVPB  Status:  Discontinued        2 g 200 mL/hr over 30 Minutes Intravenous Every 8 hours 05/01/20 1046 05/03/20 1832   04/30/20 1415  ceFEPIme (MAXIPIME) 1 g in sodium chloride 0.9 % 100 mL IVPB  Status:  Discontinued        1 g 200 mL/hr over 30 Minutes Intravenous Every 12 hours 04/30/20 1356 05/01/20 1046   04/30/20 1000  cefTRIAXone (ROCEPHIN) 1 g in sodium chloride 0.9 % 100 mL IVPB  Status:  Discontinued        1 g 200 mL/hr over 30 Minutes Intravenous Every 24 hours 04/29/20 1711 04/30/20 1356   04/30/20 1000  azithromycin (ZITHROMAX) 500 mg in sodium chloride 0.9 % 250 mL IVPB  Status:  Discontinued        500 mg 250 mL/hr over 60 Minutes Intravenous Every 24 hours 04/29/20 1711 04/29/20 1726   04/29/20 1800  doxycycline (VIBRAMYCIN) 100 mg in sodium chloride 0.9 % 250 mL IVPB  Status:  Discontinued       Note to Pharmacy: Please do not give azithromycin. He has prolong QT   100 mg 125 mL/hr over 120 Minutes Intravenous Every 12 hours 04/29/20 1726 04/30/20 1356   04/29/20 1130  ceFEPIme (MAXIPIME) 2 g in sodium chloride 0.9 % 100 mL IVPB        2 g 200 mL/hr over 30 Minutes Intravenous  Once 04/29/20 1125 04/29/20 1224   04/29/20  1130  metroNIDAZOLE (FLAGYL) IVPB 500 mg        500 mg 100 mL/hr over 60 Minutes Intravenous  Once 04/29/20 1125 04/29/20 1351   04/29/20 1130  vancomycin (VANCOCIN) IVPB 1000 mg/200 mL premix        1,000 mg 200 mL/hr over 60 Minutes Intravenous  Once 04/29/20 1125 04/29/20 1351       Objective: Vitals:   05/13/20 1240 05/13/20 1519 05/13/20 2110 05/14/20 0714  BP:  92/81 96/80 92/70   Pulse:  86 91 87  Resp:  15 20 20   Temp:  98.4 F (36.9 C) 97.8 F (36.6 C) 98 F (36.7 C)  TempSrc:   Oral   SpO2: 95%  90% 97%  Weight:      Height:       No intake or output data in the 24 hours ending 05/14/20 [place]   05/09/20 0546 05/10/20 0427  05/11/20 0303  Weight: 77.8 kg 77.3 kg 77.2 kg    Examination: General exam: Appears calm and comfortable, frail and ill-appearing Respiratory system: Clear to auscultation anteriorly.  On 2 L oxygen Cardiovascular system: S1 & S2 heard, RRR. No pedal edema. Gastrointestinal system: Abdomen is nondistended, soft and nontender. Normal bowel sounds heard. Central nervous system: Alert to voice Extremities: Symmetric in appearance bilaterally  Skin: No rashes, lesions or ulcers on exposed skin  Psychiatry: Confused  Data Reviewed: I have personally reviewed following labs and imaging studies  CBC: Recent Labs  Lab 05/08/20 0853 05/09/20 1338 05/10/20 0501 05/11/20 0617  WBC 10.3 9.4 9.4 8.1  HGB 12.2* 11.6* 12.2* 12.6*  HCT 36.9* 35.1* 38.1* 35.5*  MCV 81.1 80.1 83.9 77.5*  PLT 149* 150 131* 001*   Basic Metabolic Panel: Recent Labs  Lab 05/08/20 0853 05/09/20 1338 05/10/20 0501 05/11/20 0617 05/12/20 0426 05/13/20 0452  NA 135 135 136 133* 136 138  K 4.3 4.2 4.2 3.9 4.1 4.2  CL 100 98 99 97* 96* 98  CO2 18* 22 21* 23 25 25   GLUCOSE 106* 166* 105* 131* 118* 111*  BUN 89* 92* 93* 96* 94* 101*  CREATININE 3.05* 2.46* 2.39* 2.38* 2.28* 2.32*  CALCIUM 8.6* 8.9 8.9 8.6* 8.9 9.0  MG 3.2* 3.1* 3.1* 3.0*  --   --     GFR: Estimated Creatinine Clearance: 33.3 mL/min (A) (by C-G formula based on SCr of 2.32 mg/dL (H)). Liver Function Tests: Recent Labs  Lab 05/11/20 0617  AST 35  ALT 23  ALKPHOS 227*  BILITOT 1.4*  PROT 7.1  ALBUMIN 2.9*   No results for input(s): LIPASE, AMYLASE in the last 168 hours. Recent Labs  Lab 05/10/20 1405  AMMONIA 9   Coagulation Profile: No results for input(s): INR, PROTIME in the last 168 hours. Cardiac Enzymes: No results for input(s): CKTOTAL, CKMB, CKMBINDEX, TROPONINI in the last 168 hours. BNP (last 3 results) No results for input(s): PROBNP in the last 8760 hours. HbA1C: No results for input(s): HGBA1C in the last 72 hours. CBG: No results for input(s): GLUCAP in the last 168 hours. Lipid Profile: No results for input(s): CHOL, HDL, LDLCALC, TRIG, CHOLHDL, LDLDIRECT in the last 72 hours. Thyroid Function Tests: No results for input(s): TSH, T4TOTAL, FREET4, T3FREE, THYROIDAB in the last 72 hours. Anemia Panel: No results for input(s): VITAMINB12, FOLATE, FERRITIN, TIBC, IRON, RETICCTPCT in the last 72 hours. Sepsis Labs: No results for input(s): PROCALCITON, LATICACIDVEN in the last 168 hours.  No results found for this or any previous visit (from the past 240 hour(s)).    Radiology Studies: No results found.    Scheduled Meds: . calcium carbonate  400 mg of elemental calcium Oral TID WC  . collagenase   Topical Daily  . predniSONE  10 mg Oral Q breakfast  . scopolamine  1 patch Transdermal Q72H   Continuous Infusions:   LOS: 14 days      Time spent: 20 minutes   Dessa Phi, DO [facility] 05/14/2020, 10:28 AM   Available via Epic secure chat 7am-7pm After these hours, please refer to coverage provider listed on [email or web address]

## 2020-05-14 NOTE — Progress Notes (Signed)
SLP Cancellation Note  Patient Details Name: Jason Fitzgerald MRN: [id number] DOB: 08-15-1950   Cancelled treatment:       Reason Eval/Treat Not Completed:  (ST order cancelled). Upon chart review, noted ST eval and tx order had been cancelled. Palliative Care confirmed that order was cancelled and that a meeting w/ Wife and pt re: GOC was to take place this morning. Family are aware of pt's risk for aspiration stating such was noted at home prior to this hospitalization. Family stated pt was "going to do what he wants to do" as he has done in the past.  ST services continues to recommended a modified diet at this time to reduce risk for aspiration; general aspiration precautions. ST services can be available for any new swallowing issues while admitted if they arise.      Orinda Kenner, MS, CCC-SLP Speech Language Pathologist Rehab Services [phone] [facility] 05/14/2020, 2:30 PM

## 2020-05-14 NOTE — Progress Notes (Addendum)
[place] [facility]) Hospital Liaison RN note:  Received request from Jobe Gibbon, NP for family interest in [place]. Chart reviewed and eligibility was approved. Spoke with sister, Regino Schultze over the phone to confirm interest and explain services. Unfortunately, Hospice Home does not have a room to offer today. Hospital care team is aware. Regino Schultze will complete registration paperwork at the [facility] once a room becomes available. [place] Liaison will continue to follow for room availability.  Please call with any hospice related questions or concerns.  Thank you for the opportunity to participate in this patient's care.  Zandra Abts, RN [facility] Liaison [phone]

## 2020-05-15 DIAGNOSIS — K7030 Alcoholic cirrhosis of liver without ascites: Secondary | ICD-10-CM | POA: Diagnosis not present

## 2020-05-15 DIAGNOSIS — A419 Sepsis, unspecified organism: Secondary | ICD-10-CM | POA: Diagnosis not present

## 2020-05-15 DIAGNOSIS — N179 Acute kidney failure, unspecified: Secondary | ICD-10-CM | POA: Diagnosis not present

## 2020-05-15 DIAGNOSIS — R6520 Severe sepsis without septic shock: Secondary | ICD-10-CM

## 2020-05-15 DIAGNOSIS — Z515 Encounter for palliative care: Secondary | ICD-10-CM | POA: Diagnosis not present

## 2020-05-15 DIAGNOSIS — I5023 Acute on chronic systolic (congestive) heart failure: Secondary | ICD-10-CM | POA: Diagnosis not present

## 2020-05-15 DIAGNOSIS — I5022 Chronic systolic (congestive) heart failure: Secondary | ICD-10-CM

## 2020-05-15 DIAGNOSIS — R0602 Shortness of breath: Secondary | ICD-10-CM | POA: Diagnosis not present

## 2020-05-15 MED ORDER — BIOTENE DRY MOUTH MT LIQD: 15.0000 mL | OROMUCOSAL | Status: AC | PRN

## 2020-05-15 MED ORDER — POLYVINYL ALCOHOL 1.4 % OP SOLN: 1.0000 [drp] | Freq: Four times a day (QID) | OPHTHALMIC | 0 refills | Status: AC | PRN

## 2020-05-15 MED ORDER — GLYCOPYRROLATE 0.2 MG/ML IJ SOLN: 0.2000 mg | INTRAMUSCULAR | Status: AC | PRN

## 2020-05-15 MED ORDER — SCOPOLAMINE 1 MG/3DAYS TD PT72: 1.0000 | MEDICATED_PATCH | TRANSDERMAL | 12 refills | Status: AC

## 2020-05-15 MED ORDER — ONDANSETRON HCL 4 MG/2ML IJ SOLN: 4.0000 mg | Freq: Four times a day (QID) | INTRAMUSCULAR | 0 refills | Status: AC | PRN

## 2020-05-15 MED ORDER — LORAZEPAM 2 MG/ML IJ SOLN
1.0000 mg | INTRAMUSCULAR | 0 refills | Status: AC | PRN
Start: 2020-05-15 — End: ?

## 2020-05-15 MED ORDER — COLLAGENASE 250 UNIT/GM EX OINT
TOPICAL_OINTMENT | CUTANEOUS | 0 refills | Status: AC
Start: 2020-05-15 — End: ?

## 2020-05-15 MED ORDER — FENTANYL CITRATE (PF) 100 MCG/2ML IJ SOLN: 25.0000 ug | INTRAMUSCULAR | 0 refills | Status: AC | PRN

## 2020-05-15 NOTE — TOC Progression Note (Signed)
Transition of Care [facility]) - Progression Note    Patient Details  Name: Jason Fitzgerald MRN: [id number] Date of Birth: September 08, 1950  Transition of Care [facility]) CM/SW [place], RN Phone Number: 05/15/2020, 10:41 AM  Clinical Narrative:   Called [facility] and arranged for a 5 PM pick up to go to the Hospice facility,    Expected Discharge Plan: [place] Barriers to Discharge: Continued Medical Work up  Expected Discharge Plan and Services Expected Discharge Plan: [place] In-house Referral: Clinical Social Work   Post Acute Care Choice: [place] Living arrangements for the past 2 months: Single Family Home Expected Discharge Date: 05/15/20                                     Social Determinants of Health (SDOH) Interventions    Readmission Risk Interventions Readmission Risk Prevention Plan 05/01/2020 12/12/2019 12/10/2019  Transportation Screening Complete Complete Complete  PCP or Specialist Appt within 3-5 Days - Complete Complete  HRI or Home Care Consult - Complete Complete  Social Work Consult for [place] Planning/Counseling - Complete Complete  Palliative Care Screening - Complete Not Applicable  Medication Review Press photographer) Complete Complete Complete  PCP or Specialist appointment within 3-5 days of discharge Complete - -  [place] or Home Care Consult Complete - -  SW Recovery Care/Counseling Consult Complete - -  Palliative Care Screening Not Applicable - -  Skilled Nursing Facility Complete - -  Some recent data might be hidden

## 2020-05-15 NOTE — Progress Notes (Signed)
[place] [facility]) Hospital Liaison RN note:  [facility] does have a room to offer patient today. Spoke with sister, Regino Schultze, over the phone and she will be signing registration paperwork at the [facility] [place] can be arranged for a 5 pm pickup. Hospital care team is aware. I will fax the discharge summary to the Hospice Home once it is available.  Please call with any hospice related questions or concerns.  Thank you for the opportunity to participate in this patient's care.  Zandra Abts, RN [facility] Liaison  [phone]

## 2020-05-15 NOTE — Progress Notes (Signed)
Sharlyne Pacas RN  From [facility] called gave her report.

## 2020-05-15 NOTE — Progress Notes (Signed)
Palliative Note:  Chart Reviewed. Patient Assessed.   Patient remains somnolent. Will open eyes intermittently to verbal stimuli. Denies pain. Appetite remains poor with bites and sips occasionally.   No family at the bedside.   Sister has been updated and aware of transfer later today to hospice home. She is planning on going to complete required paperwork around lunchtime.   Education provided on end-of-life and continued support.   All questions answered and support provided.   Assessment -somnolent, chronically ill appearing -RRR -diminished in bases  Plan -comfort care -discharging later today to residential hospice  Time Total: 20 min   Visit consisted of counseling and education dealing with the complex and emotionally intense issues of symptom management and palliative care in the setting of serious and potentially life-threatening illness.Greater than 50%  of this time was spent counseling and coordinating care related to the above assessment and plan.  Alda Lea, AGPCNP-BC  Palliative Medicine Team [phone]

## 2020-05-15 NOTE — Discharge Summary (Signed)
[facility] [place] NAME: Jason Fitzgerald    MR#:  [id number]  [place] OF BIRTH:  Apr 25, 1950  DATE OF ADMISSION:  04/29/2020 ADMITTING PHYSICIAN: Sharen Hones, MD  DATE OF DISCHARGE: 05/15/2020 to [place]: Steele Sizer, MD    ADMISSION DIAGNOSIS:  Shortness of breath [R06.02] SOB (shortness of breath) [R06.02] Acute on chronic systolic congestive heart failure (HCC) [I50.23] Atrial fibrillation with RVR (HCC) [I48.91] Sepsis without acute organ dysfunction, due to unspecified organism ([place]) [A41.9] Acute on chronic systolic (congestive) heart failure (HCC) [I50.23]  DISCHARGE DIAGNOSIS:  Principal Problem:   Shortness of breath Active Problems:   Arteriosclerosis of coronary artery   HTN (hypertension)   Interstitial lung disease (HCC)   Chronic cough   Degenerative arthritis of lumbar spine   Chronic systolic CHF (congestive heart failure) (HCC)   AF (paroxysmal atrial fibrillation) (HCC)   Rheumatoid arthritis involving multiple sites with positive rheumatoid factor (HCC)   Fibrosis, pulmonary, interstitial, diffuse (HCC)   GERD (gastroesophageal reflux disease)   COPD (chronic obstructive pulmonary disease) (HCC)   CHF (congestive heart failure) (HCC)   Anemia   Atrial fibrillation (HCC)   Non compliance w medication regimen   DNR (do not resuscitate)   Prolonged QT interval   Acute on chronic systolic (congestive) heart failure (HCC)   Sepsis (HCC)   Aspiration pneumonia of both lower lobes due to gastric secretions ([place])   SECONDARY DIAGNOSIS:   Past Medical History:  Diagnosis Date  . CHF (congestive heart failure) ([place])   . Chronic cough   . COPD (chronic obstructive pulmonary disease) ([place])   . Elevated liver function tests   . Emphysema of lung ([place])   . Fibrosis, pulmonary, interstitial, diffuse ([place])   . GERD (gastroesophageal reflux disease)   . History of cocaine abuse ([place])    . Mediastinal lymphadenopathy   . Pulmonary fibrosis ([place])   . Right inguinal hernia     HOSPITAL COURSE:   1.  End-of-life care.  Patient was transitioned to full comfort care measures.  Patient was seen in consultation by palliative care.  Patient will go out to the hospice home on 05/15/2020. 2.  Acute kidney injury.  Creatinine was normal and then worsened and peaked at 3.05.  Last creatinine that was checked was 2.32 on 05/13/2020. 3.  Liver cirrhosis.  History of prior alcohol abuse, severe hypoalbuminemia and coagulopathy 4.  Severe sepsis, present on admission aspiration pneumonia.  Multiorgan failure.  Patient made comfort care by palliative care and patient will be transferred out to the hospice home. 5.  Hypotension 6.  Paroxysmal atrial fibrillation 7.  Hyperlipidemia 8.  Acute metabolic encephalopathy.  Today patient mumbles a little bit and was able to move his arms on his own. 9.  Dysphagia 3 diet nectar thick liquids if able to tolerate. 10.  Interstitial lung disease 11.  Chronic systolic congestive heart failure.  Last echocardiogram showed an ejection fraction less than 20%   DISCHARGE CONDITIONS:   Guarded  CONSULTS OBTAINED:  Treatment Team:  Dionisio David, MD Ottie Glazier, MD  DRUG ALLERGIES:  No Known Allergies  DISCHARGE MEDICATIONS:   Allergies as of 05/15/2020   No Known Allergies     Medication List    STOP taking these medications   acetaminophen 325 MG tablet Commonly known as: TYLENOL   atorvastatin 40 MG tablet Commonly known as: LIPITOR   folic acid 1 MG tablet Commonly  known as: FOLVITE   furosemide 20 MG tablet Commonly known as: Lasix   metoprolol succinate 25 MG 24 hr tablet Commonly known as: TOPROL-XL   multivitamin with minerals Tabs tablet   potassium chloride SA 20 MEQ tablet Commonly known as: KLOR-CON   thiamine 100 MG tablet     TAKE these medications   antiseptic oral rinse Liqd Apply 15 mLs topically  as needed for dry mouth.   collagenase ointment Commonly known as: SANTYL Apply to wounds daily and cover with foam pads   fentaNYL 100 MCG/2ML injection Commonly known as: SUBLIMAZE Inject 0.5 mLs (25 mcg total) into the vein every 30 (thirty) minutes as needed for severe pain (or dyspnea).   glycopyrrolate 0.2 MG/ML injection Commonly known as: ROBINUL Inject 1 mL (0.2 mg total) into the vein every 4 (four) hours as needed (excessive secretions).   LORazepam 2 MG/ML injection Commonly known as: ATIVAN Inject 0.5 mLs (1 mg total) into the vein every 4 (four) hours as needed for anxiety.   ondansetron 4 MG/2ML Soln injection Commonly known as: ZOFRAN Inject 2 mLs (4 mg total) into the vein every 6 (six) hours as needed for nausea or vomiting.   polyvinyl alcohol 1.4 % ophthalmic solution Commonly known as: LIQUIFILM TEARS Place 1 drop into both eyes 4 (four) times daily as needed for dry eyes.   scopolamine 1 MG/3DAYS Commonly known as: TRANSDERM-SCOP Place 1 patch (1.5 mg total) onto the skin every 3 (three) days. Start taking on: May 16, 2020        DISCHARGE INSTRUCTIONS:   Follow-up team at hospice home 1 day  If you experience worsening of your admission symptoms, develop shortness of breath, life threatening emergency, suicidal or homicidal thoughts you must seek medical attention immediately by calling 911 or calling your MD immediately  if symptoms less severe.  You Must read complete instructions/literature along with all the possible adverse reactions/side effects for all the Medicines you take and that have been prescribed to you. Take any new Medicines after you have completely understood and accept all the possible adverse reactions/side effects.   Please note  You were cared for by a hospitalist during your hospital stay. If you have any questions about your discharge medications or the care you received while you were in the hospital after you are  discharged, you can call the unit and asked to speak with the hospitalist on call if the hospitalist that took care of you is not available. Once you are discharged, your primary care physician will handle any further medical issues. Please note that NO REFILLS for any discharge medications will be authorized once you are discharged, as it is imperative that you return to your primary care physician (or establish a relationship with a primary care physician if you do not have one) for your aftercare needs so that they can reassess your need for medications and monitor your lab values.    Today   CHIEF COMPLAINT:  No chief complaint on file.   HISTORY OF PRESENT ILLNESS:  Jason Fitzgerald  is a 70 y.o. male with a known history of hypertension, interstitial lung disease, chronic systolic heart failure, atrial fibrillation presented to the hospital with shortness of breath   VITAL SIGNS:  Blood pressure 100/68, pulse (!) 116, temperature 97.9 F (36.6 C), resp. rate 16, height 6' (1.829 m), weight 77.2 kg, SpO2 100 %.  I/O:    Intake/Output Summary (Last 24 hours) at 05/15/2020 1006 Last data filed  at 05/14/2020 1046 Gross per 24 hour  Intake 0 ml  Output --  Net 0 ml    PHYSICAL EXAMINATION:  GENERAL:  70 y.o.-year-old patient lying in the bed with no acute distress.  EYES: Pupils equal, round, reactive to light and accommodation. HEENT: Head atraumatic, normocephalic. Oropharynx and nasopharynx clear.  LUNGS: Normal breath sounds bilaterally, no wheezing, rales,rhonchi or crepitation. No use of accessory muscles of respiration.  CARDIOVASCULAR: S1, S2 tachycardic. No murmurs, rubs, or gallops.  ABDOMEN: Soft, non-tender, non-distended. EXTREMITIES: No pedal edema.  Some blackish lesions on his fingers. NEUROLOGIC: Patient moving arms on his own PSYCHIATRIC: The patient is lethargic.  SKIN: Decubiti covered.   DATA REVIEW:   CBC Recent Labs  Lab 05/11/20 0617  WBC 8.1  HGB  12.6*  HCT 35.5*  PLT 127*    Chemistries  Recent Labs  Lab 05/11/20 0617 05/12/20 0426 05/13/20 0452  NA 133*   < > 138  K 3.9   < > 4.2  CL 97*   < > 98  CO2 23   < > 25  GLUCOSE 131*   < > 111*  BUN 96*   < > 101*  CREATININE 2.38*   < > 2.32*  CALCIUM 8.6*   < > 9.0  MG 3.0*  --   --   AST 35  --   --   ALT 23  --   --   ALKPHOS 227*  --   --   BILITOT 1.4*  --   --    < > = values in this interval not displayed.     Microbiology Results  Results for orders placed or performed during the hospital encounter of 04/29/20  Culture, blood (routine x 2)     Status: None   Collection Time: 04/29/20  9:34 AM   Specimen: BLOOD  Result Value Ref Range Status   Specimen Description BLOOD RIGHT ARM  Final   Special Requests   Final    BOTTLES DRAWN AEROBIC AND ANAEROBIC Blood Culture adequate volume   Culture   Final    NO GROWTH 5 DAYS Performed at [facility], [address], [place], [place] [address]    Report Status 05/04/2020 FINAL  Final  Culture, blood (routine x 2)     Status: None   Collection Time: 04/29/20  9:34 AM   Specimen: BLOOD  Result Value Ref Range Status   Specimen Description BLOOD BLOOD LEFT ARM  Final   Special Requests   Final    BOTTLES DRAWN AEROBIC AND ANAEROBIC Blood Culture adequate volume   Culture   Final    NO GROWTH 5 DAYS Performed at [facility], [address], [place], [place] [address]    Report Status 05/04/2020 FINAL  Final  SARS CORONAVIRUS 2 (TAT 6-24 HRS) Nasopharyngeal Nasopharyngeal Swab     Status: None   Collection Time: 04/29/20  9:40 AM   Specimen: Nasopharyngeal Swab  Result Value Ref Range Status   SARS Coronavirus 2 NEGATIVE NEGATIVE Final    Comment: (NOTE) SARS-CoV-2 target nucleic acids are NOT DETECTED.  The SARS-CoV-2 RNA is generally detectable in upper and lower respiratory specimens during the acute phase of infection. Negative results do not preclude SARS-CoV-2 infection, do not  rule out co-infections with other pathogens, and should not be used as the sole basis for treatment or other patient management decisions. Negative results must be combined with clinical observations, patient history, and epidemiological information. The  expected result is Negative.  Fact Sheet for Patients: SugarRoll.be  Fact Sheet for Healthcare Providers: https://www.woods-mathews.com/  This test is not yet approved or cleared by the [place] FDA and  has been authorized for detection and/or diagnosis of SARS-CoV-2 by FDA under an Emergency Use Authorization (EUA). This EUA will remain  in effect (meaning this test can be used) for the duration of the COVID-19 declaration under Se ction 564(b)(1) of the Act, 21 U.S.C. section 360bbb-3(b)(1), unless the authorization is terminated or revoked sooner.  Performed at [facility], [place] [address], [place], [place] [address]     Hospice home has a bed for later today.  CODE STATUS:     Code Status Orders  (From admission, onward)         Start     Ordered   05/13/20 1615  Do not attempt resuscitation (DNR)  Continuous       Question Answer Comment  In the event of cardiac or respiratory ARREST Do not call a "code blue"   In the event of cardiac or respiratory ARREST Do not perform Intubation, CPR, defibrillation or ACLS   In the event of cardiac or respiratory ARREST Use medication by any route, position, wound care, and other measures to relive pain and suffering. May use oxygen, suction and manual treatment of airway obstruction as needed for comfort.      05/13/20 1617        Code Status History    Date Active Date Inactive Code Status Order ID Comments User Context   05/13/2020 1056 05/13/2020 1617 DNR [id number]  Dessa Phi, DO Inpatient   05/01/2020 1343 05/13/2020 1055 Full Code [id number]  Enzo Bi, MD Inpatient   04/29/2020 1256 05/01/2020 1343 DNR [id number]  Cox,  [place], DO ED   04/22/2020 0956 04/22/2020 1825 DNR [id number]  Debbe Odea, MD Inpatient   04/18/2020 1008 04/22/2020 0956 Full Code [id number]  Collier Bullock, MD ED   12/08/2019 1430 12/14/2019 0120 Full Code [id number]  Ivor Costa, MD ED   08/05/2019 0423 08/07/2019 2106 Full Code [id number]  Mansy, Arvella Merles, MD ED   12/17/2018 1453 12/20/2018 1529 Full Code [id number]  Para Skeans, MD ED   02/03/2018 1520 02/05/2018 1811 Full Code [id number]  Saundra Shelling, MD ED   03/04/2017 1231 03/06/2017 1707 Full Code [id number]  Demetrios Loll, MD ED   10/26/2014 1128 10/26/2014 1500 Full Code [id number]  Hubbard Robinson, MD Outpatient   Advance Care Planning Activity      TOTAL TIME TAKING CARE OF THIS PATIENT: 31 minutes.    Loletha Grayer M.D on 05/15/2020 at 10:06 AM  Between 7am to 6pm - Pager - [phone]  After 6pm go to [email or web address] - password [email or web address]  [facility]  CC: Primary care physician; Steele Sizer, MD

## 2020-05-24 ENCOUNTER — Ambulatory Visit: Payer: Medicare Other | Admitting: Family

## 2020-06-02 DEATH — deceased

## 2021-10-29 IMAGING — CT CT ANGIO CHEST
2 of 6 series · 17 of 46 positions shown · IV contrast (APPLIED)
Comparison: 08/05/2018 chest radiograph. 12/17/2018 chest CT
angiogram.

CLINICAL DATA: Inpatient. Rheumatoid arthritis. COPD. Pulmonary
fibrosis. Chest pain and dyspnea. Nonproductive cough. Arrhythmia.

EXAM:
CT ANGIOGRAPHY CHEST WITH CONTRAST
TECHNIQUE: Multidetector CT imaging of the chest was performed using the
standard protocol during bolus administration of intravenous
contrast. Multiplanar CT image reconstructions and MIPs were
obtained to evaluate the vascular anatomy.
CONTRAST:  75mL OMNIPAQUE IOHEXOL 350 MG/ML SOLN

[Series 5: thins · axial · 0.75mm/px · z∈[-346,-12]mm · 14 of 366 slices shown]
[im 16/366  lung]
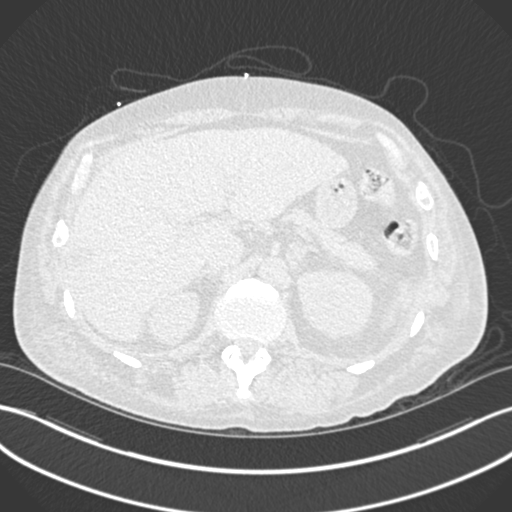
[im 48/366  soft-tissue]
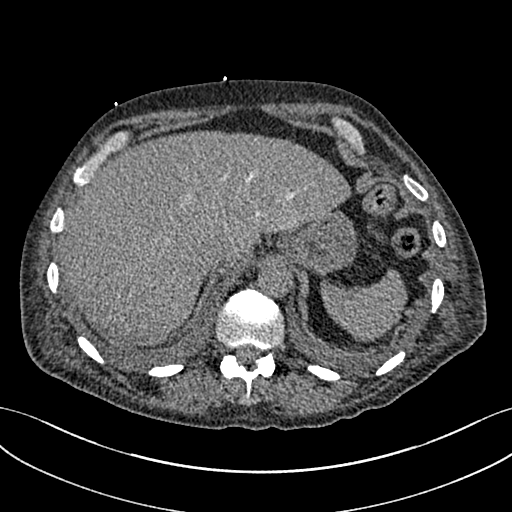
[im 64/366  lung]
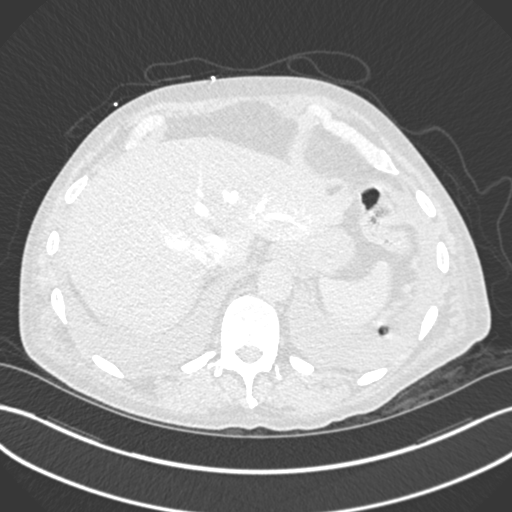
[im 96/366  soft-tissue]
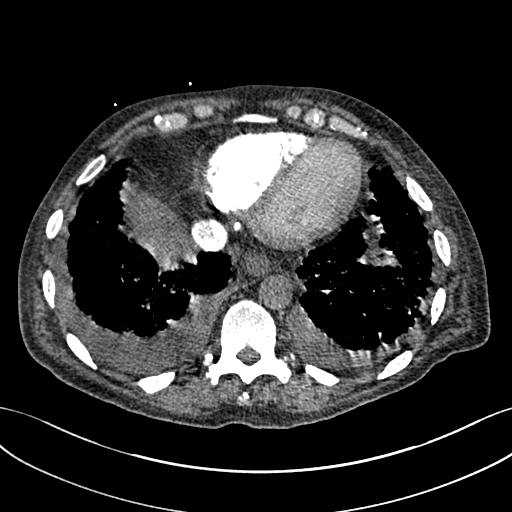
[im 127/366  lung]
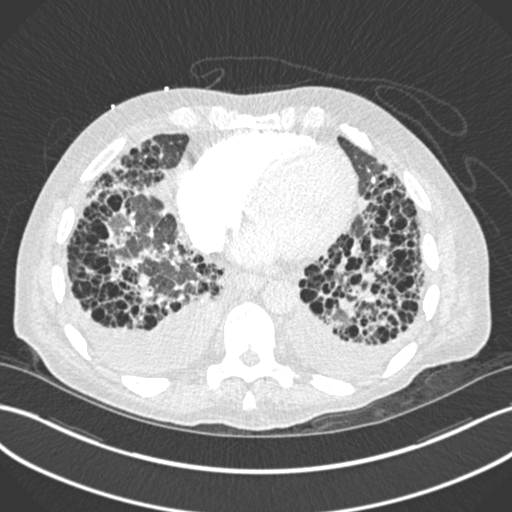
[im 143/366  soft-tissue]
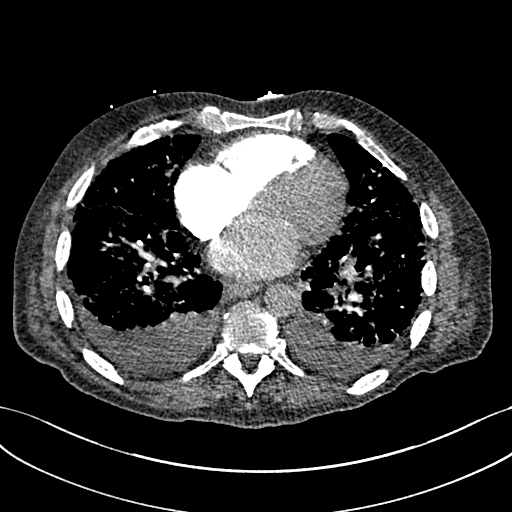
[im 175/366  lung]
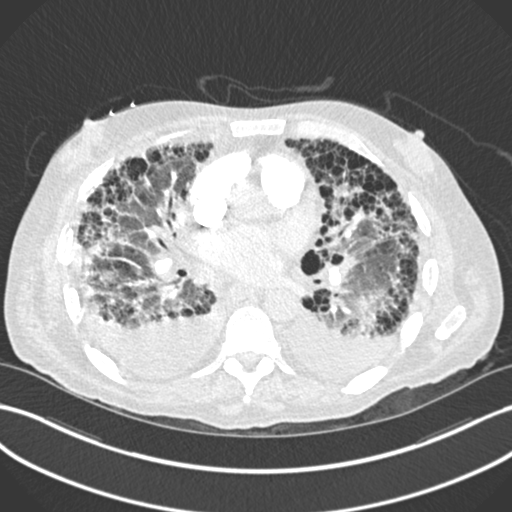
[im 191/366  soft-tissue]
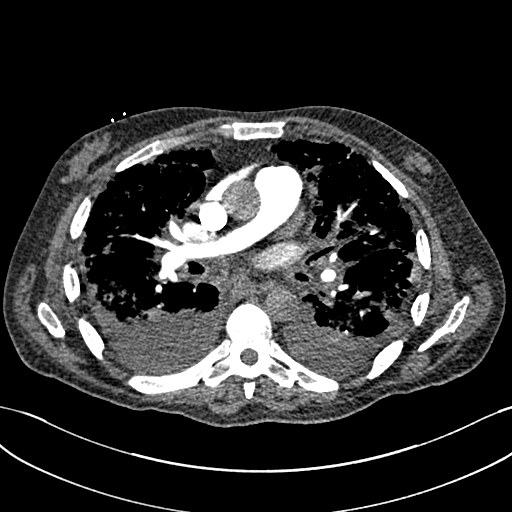
[im 223/366  lung]
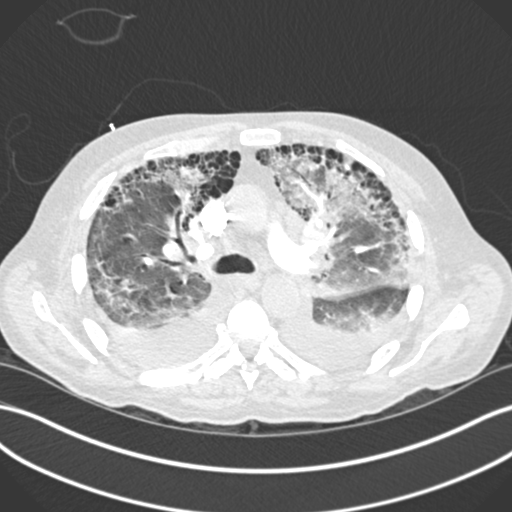
[im 239/366  soft-tissue]
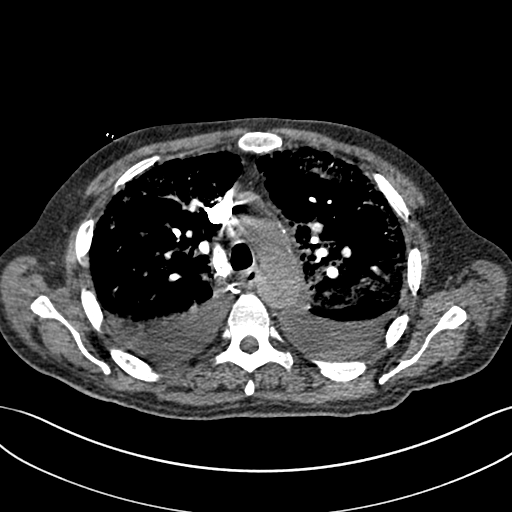
[im 270/366  lung]
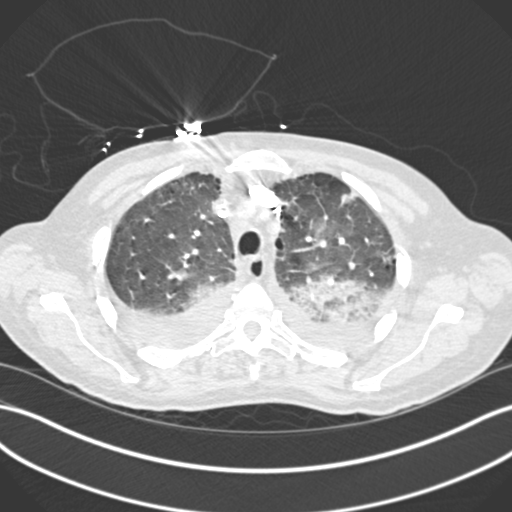
[im 302/366  soft-tissue]
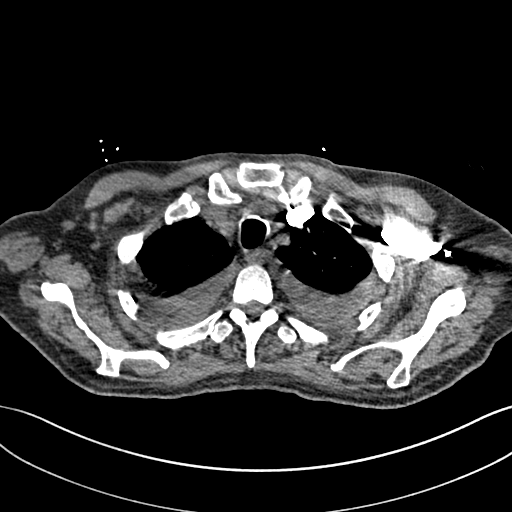
[im 318/366  lung]
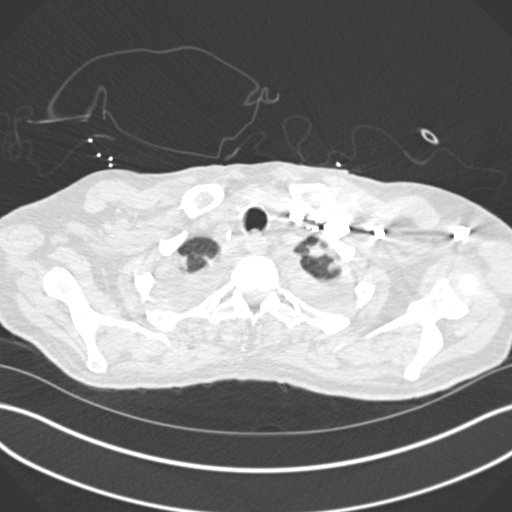
[im 350/366  soft-tissue]
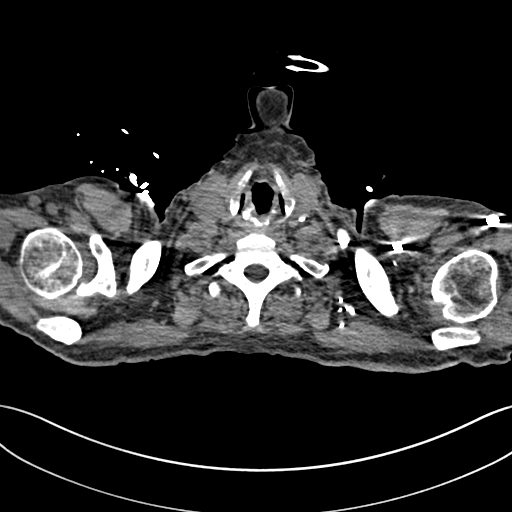

[Series 7: coronal mpr · coronal · 0.74mm/px · 3 of 83 slices shown]
[im 21/83  soft-tissue]
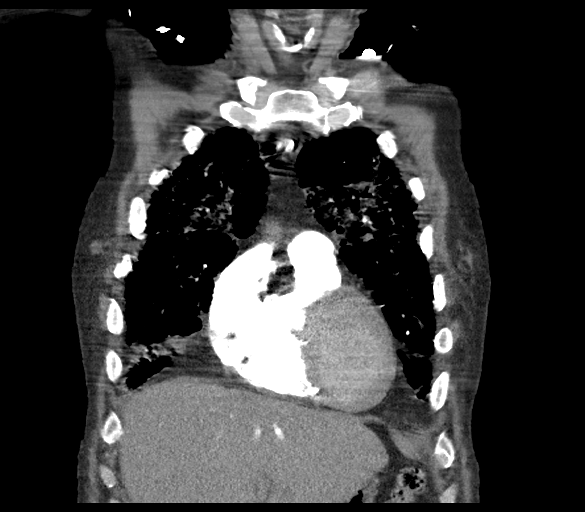
[im 42/83  soft-tissue]
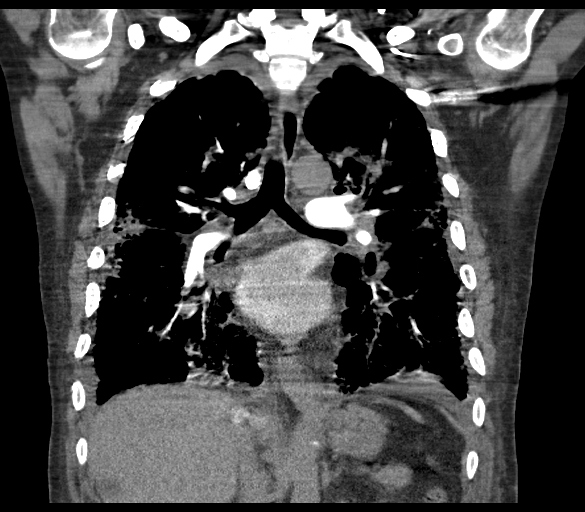
[im 62/83  soft-tissue]
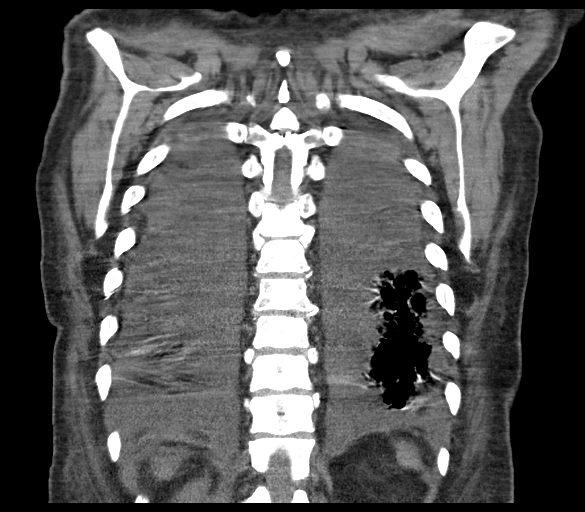

[17 of 46 positions shown; findings below may reference images not displayed]

FINDINGS: Cardiovascular: The study is moderate quality for the evaluation of
pulmonary embolism, with motion degradation. There are no convincing
filling defects in the central, lobar, segmental or subsegmental
pulmonary artery branches to suggest acute pulmonary embolism.
Atherosclerotic nonaneurysmal thoracic aorta. Normal caliber
pulmonary arteries. Normal heart size. No significant pericardial
fluid/thickening. Left anterior descending coronary atherosclerosis.

Mediastinum/Nodes: No discrete thyroid nodules. Unremarkable
esophagus. No axillary adenopathy. New mild bilateral mediastinal
and bilateral hilar lymphadenopathy. Representative 1.2 cm AP window
node (series 4/image 47), 1.2 cm right subcarinal node (series
4/image 65), 1.5 cm right hilar node (series 4/image 57) and 1.2 cm
left hilar node (series 4/image 57).

Lungs/Pleura: No pneumothorax. Small dependent bilateral pleural
effusions, right greater than left. No lung masses or significant
pulmonary nodules. Patchy consolidation and ground-glass opacity
throughout the bilateral upper lobes is new, most prominent in the
posterior left upper lobe. Background of severe patchy confluent
subpleural reticulation and severe honeycombing with associated
volume loss, parenchymal distortion and mild traction bronchiectasis
with a basilar predominance, not appreciably changed.

Upper abdomen: Contrast reflux into the IVC and hepatic veins.
Simple 2.4 cm peripheral right liver cyst.

Musculoskeletal: No aggressive appearing focal osseous lesions.
Marked bilateral gynecomastia, unchanged.

Review of the MIP images confirms the above findings.
IMPRESSION: 1. Motion degraded scan. No evidence of pulmonary embolism.
2. New patchy consolidation and ground-glass opacity throughout the
bilateral upper lobes, most prominent in the posterior left upper
lobe. Given the small dependent bilateral pleural effusions and the
contrast reflux into the IVC and hepatic veins with reported history
of CHF, differential for these upper lobe pulmonary opacities
includes acute cardiogenic pulmonary edema versus multilobar
pneumonia.
3. New mild bilateral mediastinal and bilateral hilar
lymphadenopathy, nonspecific, potentially reactive. Suggest
attention on follow-up chest CT with IV contrast in 3-6 months.
4. Background of severe basilar predominant fibrotic interstitial
lung disease with marked honeycombing, not appreciably changed.
Findings are consistent with UIP per consensus guidelines: Diagnosis
of Idiopathic Pulmonary Fibrosis: An Official ATS/ERS/JRS/ALAT
Clinical Practice Guideline. Am J Respir Crit Care Med Vol 198, Lesya
5, [id number][DATE].
5. One vessel coronary atherosclerosis.
6. Aortic Atherosclerosis (ARH8M-T08.8).
